# Patient Record
Sex: Female | Born: 1935 | Race: White | Hispanic: No | Smoking: Never smoker
Health system: Southern US, Community
[De-identification: ages and names within clinical notes are randomized; demographics above are authoritative.]

## PROBLEM LIST (undated history)

## (undated) DIAGNOSIS — I495 Sick sinus syndrome: Secondary | ICD-10-CM

## (undated) DIAGNOSIS — Z860100 Personal history of colon polyps, unspecified: Secondary | ICD-10-CM

## (undated) DIAGNOSIS — M2041 Other hammer toe(s) (acquired), right foot: Secondary | ICD-10-CM

## (undated) DIAGNOSIS — Z853 Personal history of malignant neoplasm of breast: Secondary | ICD-10-CM

## (undated) DIAGNOSIS — I214 Non-ST elevation (NSTEMI) myocardial infarction: Secondary | ICD-10-CM

## (undated) DIAGNOSIS — S46219A Strain of muscle, fascia and tendon of other parts of biceps, unspecified arm, initial encounter: Secondary | ICD-10-CM

## (undated) DIAGNOSIS — M1990 Unspecified osteoarthritis, unspecified site: Secondary | ICD-10-CM

## (undated) DIAGNOSIS — K5730 Diverticulosis of large intestine without perforation or abscess without bleeding: Secondary | ICD-10-CM

## (undated) DIAGNOSIS — K219 Gastro-esophageal reflux disease without esophagitis: Secondary | ICD-10-CM

## (undated) DIAGNOSIS — I480 Paroxysmal atrial fibrillation: Secondary | ICD-10-CM

## (undated) HISTORY — DX: Other hammer toe(s) (acquired), right foot: M20.41

## (undated) HISTORY — DX: Strain of muscle, fascia and tendon of other parts of biceps, unspecified arm, initial encounter: S46.219A

## (undated) HISTORY — DX: Unspecified osteoarthritis, unspecified site: M19.90

## (undated) HISTORY — DX: Personal history of colon polyps, unspecified: Z86.0100

## (undated) HISTORY — PX: TOE SURGERY: SHX1073

## (undated) HISTORY — DX: Personal history of colonic polyps: Z86.010

## (undated) HISTORY — DX: Gastro-esophageal reflux disease without esophagitis: K21.9

## (undated) HISTORY — DX: Diverticulosis of large intestine without perforation or abscess without bleeding: K57.30

## (undated) HISTORY — DX: Paroxysmal atrial fibrillation: I48.0

## (undated) HISTORY — PX: ABDOMINAL HYSTERECTOMY: SHX81

## (undated) HISTORY — DX: Personal history of malignant neoplasm of breast: Z85.3

## (undated) HISTORY — DX: Sick sinus syndrome: I49.5

## (undated) HISTORY — DX: Non-ST elevation (NSTEMI) myocardial infarction: I21.4

---

## 1964-08-14 HISTORY — PX: PARTIAL HYSTERECTOMY: SHX80

## 1982-08-14 HISTORY — PX: MASTECTOMY: SHX3

## 1983-08-15 HISTORY — PX: MASTECTOMY: SHX3

## 1995-08-15 HISTORY — PX: CORRECTION HAMMER TOE: SUR317

## 1999-10-13 HISTORY — PX: ESOPHAGOGASTRODUODENOSCOPY: SHX1529

## 2004-10-03 ENCOUNTER — Ambulatory Visit: Payer: Self-pay | Admitting: Internal Medicine

## 2004-11-28 ENCOUNTER — Ambulatory Visit: Payer: Self-pay | Admitting: Internal Medicine

## 2005-01-06 ENCOUNTER — Ambulatory Visit: Payer: Self-pay | Admitting: Internal Medicine

## 2005-02-01 ENCOUNTER — Encounter (INDEPENDENT_AMBULATORY_CARE_PROVIDER_SITE_OTHER): Payer: Self-pay | Admitting: *Deleted

## 2005-02-01 ENCOUNTER — Ambulatory Visit: Payer: Self-pay | Admitting: Internal Medicine

## 2005-03-03 ENCOUNTER — Ambulatory Visit: Payer: Self-pay | Admitting: Internal Medicine

## 2005-06-28 ENCOUNTER — Ambulatory Visit: Payer: Self-pay | Admitting: Internal Medicine

## 2005-07-03 ENCOUNTER — Ambulatory Visit: Payer: Self-pay | Admitting: Internal Medicine

## 2005-08-24 ENCOUNTER — Ambulatory Visit: Payer: Self-pay | Admitting: Family Medicine

## 2005-09-17 ENCOUNTER — Other Ambulatory Visit: Payer: Self-pay

## 2005-09-17 ENCOUNTER — Emergency Department: Payer: Self-pay | Admitting: Emergency Medicine

## 2005-09-21 ENCOUNTER — Encounter: Payer: Self-pay | Admitting: Internal Medicine

## 2005-10-16 ENCOUNTER — Ambulatory Visit: Payer: Self-pay | Admitting: Internal Medicine

## 2006-03-26 ENCOUNTER — Ambulatory Visit: Payer: Self-pay | Admitting: Internal Medicine

## 2006-09-18 ENCOUNTER — Ambulatory Visit: Payer: Self-pay | Admitting: Family Medicine

## 2006-10-02 ENCOUNTER — Ambulatory Visit: Payer: Self-pay | Admitting: Family Medicine

## 2006-12-20 ENCOUNTER — Encounter: Payer: Self-pay | Admitting: Internal Medicine

## 2007-02-11 ENCOUNTER — Ambulatory Visit: Payer: Self-pay | Admitting: Internal Medicine

## 2007-03-07 DIAGNOSIS — K5730 Diverticulosis of large intestine without perforation or abscess without bleeding: Secondary | ICD-10-CM | POA: Insufficient documentation

## 2007-03-07 DIAGNOSIS — J4520 Mild intermittent asthma, uncomplicated: Secondary | ICD-10-CM | POA: Insufficient documentation

## 2007-03-07 DIAGNOSIS — J309 Allergic rhinitis, unspecified: Secondary | ICD-10-CM | POA: Insufficient documentation

## 2007-03-07 DIAGNOSIS — Z853 Personal history of malignant neoplasm of breast: Secondary | ICD-10-CM | POA: Insufficient documentation

## 2007-03-07 DIAGNOSIS — K219 Gastro-esophageal reflux disease without esophagitis: Secondary | ICD-10-CM | POA: Insufficient documentation

## 2007-03-14 ENCOUNTER — Ambulatory Visit: Payer: Self-pay | Admitting: Internal Medicine

## 2007-03-14 DIAGNOSIS — L570 Actinic keratosis: Secondary | ICD-10-CM | POA: Insufficient documentation

## 2007-03-14 LAB — CONVERTED CEMR LAB: Glucose, Bld: 107 mg/dL — ABNORMAL HIGH (ref 70–99)

## 2007-07-04 ENCOUNTER — Encounter: Payer: Self-pay | Admitting: Internal Medicine

## 2007-12-18 ENCOUNTER — Ambulatory Visit: Payer: Self-pay | Admitting: Internal Medicine

## 2007-12-18 DIAGNOSIS — M159 Polyosteoarthritis, unspecified: Secondary | ICD-10-CM | POA: Insufficient documentation

## 2007-12-18 LAB — CONVERTED CEMR LAB
Bilirubin Urine: NEGATIVE
Blood in Urine, dipstick: NEGATIVE
Glucose, Urine, Semiquant: NEGATIVE
Ketones, urine, test strip: NEGATIVE
Nitrite: NEGATIVE
Specific Gravity, Urine: <1.005
Urobilinogen, UA: 0.2
pH: 6.5

## 2008-01-30 ENCOUNTER — Encounter: Payer: Self-pay | Admitting: Internal Medicine

## 2008-03-06 ENCOUNTER — Encounter: Payer: Self-pay | Admitting: Internal Medicine

## 2008-03-06 ENCOUNTER — Ambulatory Visit: Payer: Self-pay | Admitting: Family Medicine

## 2008-03-06 LAB — CONVERTED CEMR LAB
Bilirubin Urine: NEGATIVE
Blood in Urine, dipstick: NEGATIVE
Glucose, Urine, Semiquant: NEGATIVE
Ketones, urine, test strip: NEGATIVE
Specific Gravity, Urine: 1.01
pH: 7

## 2008-03-07 ENCOUNTER — Encounter: Payer: Self-pay | Admitting: Family Medicine

## 2008-03-17 ENCOUNTER — Ambulatory Visit: Payer: Self-pay | Admitting: Internal Medicine

## 2008-03-18 LAB — CONVERTED CEMR LAB
Albumin: 3.8 g/dL (ref 3.5–5.2)
BUN: 19 mg/dL (ref 6–23)
Basophils Absolute: 0 10*3/uL (ref 0.0–0.1)
Basophils Relative: 0.7 % (ref 0.0–3.0)
CO2: 28 meq/L (ref 19–32)
Calcium: 9.5 mg/dL (ref 8.4–10.5)
Chloride: 106 meq/L (ref 96–112)
Creatinine, Ser: 0.9 mg/dL (ref 0.4–1.2)
Eosinophils Absolute: 0.1 10*3/uL (ref 0.0–0.7)
Eosinophils Relative: 2.6 % (ref 0.0–5.0)
GFR calc Af Amer: 78 mL/min
GFR calc non Af Amer: 64 mL/min
Glucose, Bld: 102 mg/dL — ABNORMAL HIGH (ref 70–99)
HCT: 40.6 % (ref 36.0–46.0)
Hemoglobin: 14 g/dL (ref 12.0–15.0)
Lymphocytes Relative: 30.8 % (ref 12.0–46.0)
MCHC: 34.5 g/dL (ref 30.0–36.0)
MCV: 92.5 fL (ref 78.0–100.0)
Monocytes Absolute: 0.3 10*3/uL (ref 0.1–1.0)
Monocytes Relative: 6 % (ref 3.0–12.0)
Neutro Abs: 3.5 10*3/uL (ref 1.4–7.7)
Neutrophils Relative %: 59.9 % (ref 43.0–77.0)
Phosphorus: 3.5 mg/dL (ref 2.3–4.6)
Platelets: 186 10*3/uL (ref 150–400)
Potassium: 4.4 meq/L (ref 3.5–5.1)
RBC: 4.39 M/uL (ref 3.87–5.11)
RDW: 12.7 % (ref 11.5–14.6)
Sodium: 141 meq/L (ref 135–145)
TSH: 0.6 microintl units/mL (ref 0.35–5.50)
WBC: 5.6 10*3/uL (ref 4.5–10.5)

## 2008-03-26 ENCOUNTER — Ambulatory Visit: Payer: Self-pay | Admitting: Internal Medicine

## 2008-04-09 ENCOUNTER — Ambulatory Visit: Payer: Self-pay | Admitting: Internal Medicine

## 2008-04-09 LAB — HM COLONOSCOPY

## 2008-04-23 ENCOUNTER — Telehealth: Payer: Self-pay | Admitting: Internal Medicine

## 2008-05-01 ENCOUNTER — Encounter: Payer: Self-pay | Admitting: Internal Medicine

## 2008-05-14 HISTORY — PX: ROTATOR CUFF REPAIR: SHX139

## 2008-05-18 ENCOUNTER — Encounter: Payer: Self-pay | Admitting: Internal Medicine

## 2008-05-20 ENCOUNTER — Encounter: Payer: Self-pay | Admitting: Internal Medicine

## 2008-05-26 ENCOUNTER — Ambulatory Visit (HOSPITAL_BASED_OUTPATIENT_CLINIC_OR_DEPARTMENT_OTHER): Admission: RE | Admit: 2008-05-26 | Discharge: 2008-05-27 | Payer: Self-pay | Admitting: Orthopedic Surgery

## 2008-05-26 ENCOUNTER — Encounter: Admission: RE | Admit: 2008-05-26 | Discharge: 2008-05-26 | Payer: Self-pay | Admitting: Orthopedic Surgery

## 2008-05-29 ENCOUNTER — Encounter: Payer: Self-pay | Admitting: Internal Medicine

## 2008-06-05 ENCOUNTER — Telehealth: Payer: Self-pay | Admitting: Internal Medicine

## 2008-06-10 ENCOUNTER — Encounter: Payer: Self-pay | Admitting: Internal Medicine

## 2008-07-16 ENCOUNTER — Encounter: Payer: Self-pay | Admitting: Internal Medicine

## 2008-08-03 ENCOUNTER — Encounter: Payer: Self-pay | Admitting: Internal Medicine

## 2008-08-12 ENCOUNTER — Ambulatory Visit: Payer: Self-pay | Admitting: Internal Medicine

## 2008-08-25 ENCOUNTER — Ambulatory Visit: Payer: Self-pay | Admitting: Family Medicine

## 2008-10-21 ENCOUNTER — Encounter: Payer: Self-pay | Admitting: Internal Medicine

## 2009-01-13 ENCOUNTER — Ambulatory Visit: Payer: Self-pay | Admitting: Internal Medicine

## 2009-01-13 LAB — CONVERTED CEMR LAB
Bilirubin Urine: NEGATIVE
Blood in Urine, dipstick: NEGATIVE
Glucose, Urine, Semiquant: NEGATIVE
Ketones, urine, test strip: NEGATIVE
Nitrite: NEGATIVE
Specific Gravity, Urine: 1.025
Urobilinogen, UA: 0.2
pH: 5.5

## 2009-03-09 ENCOUNTER — Encounter: Payer: Self-pay | Admitting: Internal Medicine

## 2009-07-27 ENCOUNTER — Ambulatory Visit: Payer: Self-pay | Admitting: Internal Medicine

## 2009-08-02 LAB — CONVERTED CEMR LAB
ALT: 16 units/L (ref 0–35)
AST: 18 units/L (ref 0–37)
Albumin: 3.6 g/dL (ref 3.5–5.2)
Alkaline Phosphatase: 59 units/L (ref 39–117)
BUN: 19 mg/dL (ref 6–23)
Basophils Absolute: 0 10*3/uL (ref 0.0–0.1)
Basophils Relative: 0.5 % (ref 0.0–3.0)
Bilirubin, Direct: 0.1 mg/dL (ref 0.0–0.3)
CO2: 30 meq/L (ref 19–32)
Calcium: 9.8 mg/dL (ref 8.4–10.5)
Chloride: 106 meq/L (ref 96–112)
Creatinine, Ser: 0.7 mg/dL (ref 0.4–1.2)
Eosinophils Absolute: 0.2 10*3/uL (ref 0.0–0.7)
Eosinophils Relative: 3 % (ref 0.0–5.0)
GFR calc non Af Amer: 85.69 mL/min (ref >60)
Glucose, Bld: 106 mg/dL — ABNORMAL HIGH (ref 70–99)
HCT: 39.1 % (ref 36.0–46.0)
Hemoglobin: 13.5 g/dL (ref 12.0–15.0)
Lymphocytes Relative: 29.5 % (ref 12.0–46.0)
Lymphs Abs: 1.8 10*3/uL (ref 0.7–4.0)
MCHC: 34.5 g/dL (ref 30.0–36.0)
MCV: 95.9 fL (ref 78.0–100.0)
Monocytes Absolute: 0.4 10*3/uL (ref 0.1–1.0)
Monocytes Relative: 7.1 % (ref 3.0–12.0)
Neutro Abs: 3.7 10*3/uL (ref 1.4–7.7)
Neutrophils Relative %: 59.9 % (ref 43.0–77.0)
Phosphorus: 3.8 mg/dL (ref 2.3–4.6)
Platelets: 169 10*3/uL (ref 150.0–400.0)
Potassium: 4.3 meq/L (ref 3.5–5.1)
RBC: 4.08 M/uL (ref 3.87–5.11)
RDW: 12.1 % (ref 11.5–14.6)
Sodium: 141 meq/L (ref 135–145)
TSH: 0.95 microintl units/mL (ref 0.35–5.50)
Total Bilirubin: 1.1 mg/dL (ref 0.3–1.2)
Total Protein: 6.2 g/dL (ref 6.0–8.3)
WBC: 6.1 10*3/uL (ref 4.5–10.5)

## 2009-08-04 ENCOUNTER — Ambulatory Visit: Payer: Self-pay | Admitting: Family Medicine

## 2009-08-04 LAB — CONVERTED CEMR LAB: Rapid Strep: POSITIVE

## 2009-09-15 ENCOUNTER — Ambulatory Visit: Payer: Self-pay | Admitting: Internal Medicine

## 2009-09-21 ENCOUNTER — Encounter: Payer: Self-pay | Admitting: Internal Medicine

## 2009-10-15 ENCOUNTER — Ambulatory Visit: Payer: Self-pay | Admitting: Endocrinology

## 2009-10-15 DIAGNOSIS — N3000 Acute cystitis without hematuria: Secondary | ICD-10-CM | POA: Insufficient documentation

## 2009-10-15 LAB — CONVERTED CEMR LAB
Bilirubin Urine: NEGATIVE
Glucose, Urine, Semiquant: NEGATIVE
Ketones, urine, test strip: NEGATIVE
Nitrite: NEGATIVE
Specific Gravity, Urine: 1.015
Urobilinogen, UA: 0.2
WBC Urine, dipstick: NEGATIVE
pH: 5

## 2009-12-24 ENCOUNTER — Emergency Department: Payer: Self-pay | Admitting: Internal Medicine

## 2009-12-24 ENCOUNTER — Ambulatory Visit: Payer: Self-pay | Admitting: Cardiovascular Disease

## 2009-12-27 ENCOUNTER — Telehealth: Payer: Self-pay | Admitting: Cardiovascular Disease

## 2009-12-29 ENCOUNTER — Ambulatory Visit: Payer: Self-pay | Admitting: Cardiovascular Disease

## 2009-12-30 ENCOUNTER — Encounter: Payer: Self-pay | Admitting: Cardiovascular Disease

## 2009-12-30 ENCOUNTER — Telehealth: Payer: Self-pay | Admitting: Cardiovascular Disease

## 2009-12-30 LAB — CONVERTED CEMR LAB
INR: 1.23 (ref <1.50)
POC INR: 1.23
Prothrombin Time: 15.4 s
Prothrombin Time: 15.4 s — ABNORMAL HIGH (ref 11.6–15.2)

## 2009-12-31 ENCOUNTER — Ambulatory Visit: Payer: Self-pay | Admitting: Cardiovascular Disease

## 2010-01-04 ENCOUNTER — Encounter: Payer: Self-pay | Admitting: Cardiovascular Disease

## 2010-01-04 ENCOUNTER — Ambulatory Visit: Payer: Self-pay | Admitting: Cardiovascular Disease

## 2010-01-04 LAB — CONVERTED CEMR LAB: POC INR: 1.8

## 2010-01-07 ENCOUNTER — Ambulatory Visit: Payer: Self-pay | Admitting: Cardiovascular Disease

## 2010-01-10 DIAGNOSIS — I495 Sick sinus syndrome: Secondary | ICD-10-CM | POA: Insufficient documentation

## 2010-01-10 DIAGNOSIS — I471 Supraventricular tachycardia: Secondary | ICD-10-CM | POA: Insufficient documentation

## 2010-01-11 ENCOUNTER — Encounter: Payer: Self-pay | Admitting: Cardiovascular Disease

## 2010-01-12 ENCOUNTER — Ambulatory Visit: Payer: Self-pay | Admitting: Cardiovascular Disease

## 2010-01-13 LAB — CONVERTED CEMR LAB
INR: 2.46 — ABNORMAL HIGH (ref <1.50)
POC INR: 2.46
Prothrombin Time: 26.5 s
Prothrombin Time: 26.5 s — ABNORMAL HIGH (ref 11.6–15.2)

## 2010-01-20 ENCOUNTER — Encounter: Payer: Self-pay | Admitting: Internal Medicine

## 2010-01-27 ENCOUNTER — Ambulatory Visit: Payer: Self-pay | Admitting: Cardiovascular Disease

## 2010-01-27 DIAGNOSIS — I4891 Unspecified atrial fibrillation: Secondary | ICD-10-CM | POA: Insufficient documentation

## 2010-01-28 ENCOUNTER — Encounter: Payer: Self-pay | Admitting: Cardiovascular Disease

## 2010-01-28 LAB — CONVERTED CEMR LAB
INR: 2.45 — ABNORMAL HIGH (ref <1.50)
Prothrombin Time: 26.4 s — ABNORMAL HIGH (ref 11.6–15.2)

## 2010-01-31 ENCOUNTER — Encounter: Payer: Self-pay | Admitting: Cardiovascular Disease

## 2010-01-31 LAB — CONVERTED CEMR LAB
POC INR: 2.45
Prothrombin Time: 26.4 s

## 2010-02-01 ENCOUNTER — Telehealth: Payer: Self-pay | Admitting: Cardiovascular Disease

## 2010-02-07 ENCOUNTER — Ambulatory Visit: Payer: Self-pay | Admitting: Internal Medicine

## 2010-02-07 DIAGNOSIS — M549 Dorsalgia, unspecified: Secondary | ICD-10-CM | POA: Insufficient documentation

## 2010-02-21 ENCOUNTER — Ambulatory Visit: Payer: Self-pay | Admitting: Cardiovascular Disease

## 2010-02-21 LAB — CONVERTED CEMR LAB: POC INR: 4

## 2010-03-02 ENCOUNTER — Ambulatory Visit: Payer: Self-pay | Admitting: Cardiovascular Disease

## 2010-03-02 LAB — CONVERTED CEMR LAB: POC INR: 2.7

## 2010-03-16 ENCOUNTER — Ambulatory Visit: Payer: Self-pay | Admitting: Cardiovascular Disease

## 2010-03-16 LAB — CONVERTED CEMR LAB: POC INR: 2.5

## 2010-03-18 ENCOUNTER — Encounter: Payer: Self-pay | Admitting: Family Medicine

## 2010-03-21 ENCOUNTER — Ambulatory Visit: Payer: Self-pay | Admitting: Internal Medicine

## 2010-04-06 ENCOUNTER — Ambulatory Visit: Payer: Self-pay | Admitting: Cardiovascular Disease

## 2010-04-06 LAB — CONVERTED CEMR LAB: POC INR: 2

## 2010-05-05 ENCOUNTER — Ambulatory Visit: Payer: Self-pay | Admitting: Cardiovascular Disease

## 2010-05-05 LAB — CONVERTED CEMR LAB: INR: 3.6

## 2010-05-13 ENCOUNTER — Ambulatory Visit: Payer: Self-pay | Admitting: Internal Medicine

## 2010-05-13 LAB — CONVERTED CEMR LAB: POC INR: 2

## 2010-05-17 ENCOUNTER — Encounter: Payer: Self-pay | Admitting: Internal Medicine

## 2010-05-25 ENCOUNTER — Encounter: Payer: Self-pay | Admitting: Internal Medicine

## 2010-05-25 ENCOUNTER — Encounter: Payer: Self-pay | Admitting: Cardiovascular Disease

## 2010-05-25 ENCOUNTER — Ambulatory Visit: Payer: Self-pay | Admitting: Ophthalmology

## 2010-05-26 ENCOUNTER — Encounter: Payer: Self-pay | Admitting: Cardiovascular Disease

## 2010-05-26 LAB — CONVERTED CEMR LAB: POC INR: 1.6

## 2010-05-30 ENCOUNTER — Telehealth: Payer: Self-pay | Admitting: Cardiovascular Disease

## 2010-06-01 ENCOUNTER — Ambulatory Visit: Payer: Self-pay | Admitting: Ophthalmology

## 2010-06-08 ENCOUNTER — Ambulatory Visit: Payer: Self-pay | Admitting: Cardiovascular Disease

## 2010-06-08 LAB — CONVERTED CEMR LAB: POC INR: 1.6

## 2010-06-24 ENCOUNTER — Telehealth: Payer: Self-pay | Admitting: Cardiovascular Disease

## 2010-06-29 ENCOUNTER — Ambulatory Visit: Payer: Self-pay | Admitting: Cardiovascular Disease

## 2010-06-29 LAB — CONVERTED CEMR LAB: POC INR: 2.2

## 2010-07-20 ENCOUNTER — Ambulatory Visit: Payer: Self-pay | Admitting: Cardiovascular Disease

## 2010-07-20 LAB — CONVERTED CEMR LAB: POC INR: 1.9

## 2010-08-03 ENCOUNTER — Ambulatory Visit: Payer: Self-pay | Admitting: Cardiovascular Disease

## 2010-08-03 LAB — CONVERTED CEMR LAB: POC INR: 1.8

## 2010-08-09 ENCOUNTER — Ambulatory Visit
Admission: RE | Admit: 2010-08-09 | Discharge: 2010-08-09 | Payer: Self-pay | Source: Home / Self Care | Attending: Family Medicine | Admitting: Family Medicine

## 2010-08-09 DIAGNOSIS — H8110 Benign paroxysmal vertigo, unspecified ear: Secondary | ICD-10-CM | POA: Insufficient documentation

## 2010-08-09 LAB — CONVERTED CEMR LAB
Cholesterol, target level: 200 mg/dL
HDL goal, serum: 40 mg/dL
LDL Goal: 160 mg/dL

## 2010-08-11 ENCOUNTER — Encounter: Payer: Self-pay | Admitting: Family Medicine

## 2010-08-11 ENCOUNTER — Ambulatory Visit
Admission: RE | Admit: 2010-08-11 | Discharge: 2010-08-11 | Payer: Self-pay | Source: Home / Self Care | Attending: Internal Medicine | Admitting: Internal Medicine

## 2010-08-12 ENCOUNTER — Ambulatory Visit
Admission: RE | Admit: 2010-08-12 | Discharge: 2010-08-12 | Payer: Self-pay | Source: Home / Self Care | Attending: Internal Medicine | Admitting: Internal Medicine

## 2010-08-12 LAB — CONVERTED CEMR LAB
ALT: 16 units/L (ref 0–35)
AST: 19 units/L (ref 0–37)
Albumin: 3.5 g/dL (ref 3.5–5.2)
Alkaline Phosphatase: 66 units/L (ref 39–117)
BUN: 20 mg/dL (ref 6–23)
Basophils Absolute: 0 10*3/uL (ref 0.0–0.1)
Basophils Relative: 0.1 % (ref 0.0–3.0)
Bilirubin, Direct: 0.1 mg/dL (ref 0.0–0.3)
CO2: 31 meq/L (ref 19–32)
Calcium: 8.8 mg/dL (ref 8.4–10.5)
Chloride: 102 meq/L (ref 96–112)
Creatinine, Ser: 0.8 mg/dL (ref 0.4–1.2)
Eosinophils Absolute: 0.2 10*3/uL (ref 0.0–0.7)
Eosinophils Relative: 3.7 % (ref 0.0–5.0)
GFR calc non Af Amer: 77.72 mL/min (ref >60.00)
Glucose, Bld: 118 mg/dL — ABNORMAL HIGH (ref 70–99)
HCT: 40 % (ref 36.0–46.0)
Hemoglobin: 13.4 g/dL (ref 12.0–15.0)
Lymphocytes Relative: 30.5 % (ref 12.0–46.0)
Lymphs Abs: 1.9 10*3/uL (ref 0.7–4.0)
MCHC: 33.6 g/dL (ref 30.0–36.0)
MCV: 93.6 fL (ref 78.0–100.0)
Monocytes Absolute: 0.4 10*3/uL (ref 0.1–1.0)
Monocytes Relative: 6.5 % (ref 3.0–12.0)
Neutro Abs: 3.6 10*3/uL (ref 1.4–7.7)
Neutrophils Relative %: 59.2 % (ref 43.0–77.0)
Platelets: 190 10*3/uL (ref 150.0–400.0)
Potassium: 4.2 meq/L (ref 3.5–5.1)
RBC: 4.27 M/uL (ref 3.87–5.11)
RDW: 13.7 % (ref 11.5–14.6)
Sodium: 139 meq/L (ref 135–145)
TSH: 0.74 microintl units/mL (ref 0.35–5.50)
Total Bilirubin: 0.6 mg/dL (ref 0.3–1.2)
Total Protein: 6.1 g/dL (ref 6.0–8.3)
WBC: 6.1 10*3/uL (ref 4.5–10.5)

## 2010-08-24 ENCOUNTER — Ambulatory Visit: Admission: RE | Admit: 2010-08-24 | Discharge: 2010-08-24 | Payer: Self-pay | Source: Home / Self Care

## 2010-08-24 LAB — CONVERTED CEMR LAB: POC INR: 2.7

## 2010-09-15 NOTE — Assessment & Plan Note (Signed)
Summary: acute/possible uti/cmt   Vital Signs:  Patient Profile:   77 Years Old Female Height:     68 inches Weight:      164 pounds Temp:     97.7 degrees F oral Pulse rate:   68 / minute Pulse rhythm:   regular BP sitting:   140 / 60  (left arm) Cuff size:   regular  Vitals Entered By: Providence Crosby (March 06, 2008 2:52 PM)                 Chief Complaint:  ?UTI// TOOK AZO.  History of Present Illness: "I think I hacve some bacteria in my urinary track. I burning with urination, needs to void often, started at bedtime, 930-1000PM. No Fever Discomfort in lower stomach, no back pain. Las turinary infection approx last Spring she thinks. Is on Azo, no Abs since last UTI.    Prior Medications Reviewed Using: Patient Recall  Current Allergies (reviewed today): * AURAMYCIN * LATEX      Physical Exam  General:     Well-developed,well-nourished,in no acute distress; alert,appropriate and cooperative throughout examination Head:     Normocephalic and atraumatic without obvious abnormalities. No apparent alopecia or balding. Eyes:     Conjunctiva clear bilaterally.  Neck:     No deformities, masses, or tenderness noted. Chest Wall:     No deformities, masses, or tenderness noted. Lungs:     Normal respiratory effort, chest expands symmetrically. Lungs are clear to auscultation, no crackles or wheezes. Heart:     Normal rate and regular rhythm. S1 and S2 normal without gallop, murmur, click, rub or other extra sounds. Abdomen:     Mild  suprapubic discomfort. Nml BS No mass felt. Msk:     No CVAT.    Impression & Recommendations:  Problem # 1:  UTI (ICD-599.0) Suprapubic discomfort with mild WBCs, assume UTI. Last tx'd with Cipro 5/09. Will use Bactrim. U/C sent. Her updated medication list for this problem includes:    Ciprofloxacin Hcl 250 Mg Tabs (Ciprofloxacin hcl) .Marland Kitchen... 1 two times a day    Bactrim Ds 800-160 Mg Tabs (Sulfamethoxazole-trimethoprim) .Marland Kitchen...  1 tab by mouth bid  Encouraged to push clear liquids, get enough rest, and take acetaminophen as needed. To be seen in 10 days if no improvement, sooner if worse.  Orders: UA Dipstick W/ Micro (manual) (16109)   Complete Medication List: 1)  Albuterol 90 Mcg/act Aers (Albuterol) .... As needed 2)  Singulair 10 Mg Tabs (Montelukast sodium) .... Take one by mouth once a day 3)  Calcium + D  .... Take one by mouth once a day 4)  Advair Diskus 100-50 Mcg/dose Misc (Fluticasone-salmeterol) .... Use two times a day 5)  Multivitamins Tabs (Multiple vitamin) .... Take one by mouth once a day 6)  Zantac 75mg   .... Prn 7)  Zyrtec Allergy 10 Mg Tabs (Cetirizine hcl) .... Take one by mouth once a day 8)  Nasonex 50 Mcg/act Susp (Mometasone furoate) .... Use 1-2 sprays in each nostil once a day as needed 9)  Ciprofloxacin Hcl 250 Mg Tabs (Ciprofloxacin hcl) .Marland Kitchen.. 1 two times a day 10)  Bactrim Ds 800-160 Mg Tabs (Sulfamethoxazole-trimethoprim) .Marland Kitchen.. 1 tab by mouth bid    Prescriptions: BACTRIM DS 800-160 MG  TABS (SULFAMETHOXAZOLE-TRIMETHOPRIM) 1 tab by mouth bid  #20 x 0   Entered and Authorized by:   Shaune Leeks MD   Signed by:   Shaune Leeks MD on 03/06/2008  Method used:   Print then Give to Patient   RxID:   [id number]  ] Laboratory Results   Urine Tests  Date/Time Recieved: March 06, 2008 2:58 PM Date/Time Reported: March 06, 2008 2:58 PM  Routine Urinalysis   Color: orange Appearance: Hazy Glucose: negative   (Normal Range: Negative) Bilirubin: negative   (Normal Range: Negative) Ketone: negative   (Normal Range: Negative) Spec. Gravity: 1.010   (Normal Range: 1.003-1.035) Blood: negative   (Normal Range: Negative) pH: 7.0   (Normal Range: 5.0-8.0) Protein: ?   (Normal Range: Negative) Urobilinogen: ?   (Normal Range: 0-1) Nitrite: ?   (Normal Range: Negative) Leukocyte Esterace: moderate   (Normal Range: Negative)  Urine Microscopic WBC/hpf:  2-6 RBC/hpf: 0-1 Bacteria: 1+ Epithelial: None    Comments: TOOK AZO     Appended Document: Orders Update     Clinical Lists Changes  Orders: Added new Test order of T-Urine Culture (Spectrum Order) [id number]) - Signed
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
## 2010-09-15 NOTE — Consult Note (Signed)
Summary: [facility]/Consultation Report/Dr. Sypher  [facility]/Consultation Report/Dr. Sypher   Imported By: Mickle Asper 05/28/2008 11:01:09  _____________________________________________________________________  External Attachment:    Type:   Image     Comment:   External Document  Appended Document: [facility]/Consultation Report/Dr. Sypher MRI shows rotator cuff tear planning surgery

## 2010-09-15 NOTE — Assessment & Plan Note (Signed)
Summary: UTI  Medications Added ALBUTEROL 90 MCG/ACT AERS (ALBUTEROL)  SINGULAIR 10 MG TABS (MONTELUKAST SODIUM)  CIPRO 250 MG  TABS (CIPROFLOXACIN HCL) 1 two times a day        Vital Signs:  Patient Profile:   77 Years Old Female Weight:      167 pounds Temp:     98.6 degrees F oral Pulse rate:   60 / minute BP sitting:   120 / 58  (left arm)  Vitals Entered By: Wandra Mannan (February 11, 2007 10:42 AM)               Chief Complaint:  ? uti.  History of Present Illness: Feeling her urine symptoms since 6/27 Feels tired, dysuria + urgency No hematuria No fever, chills or back pain  Tried cranberry this weekend then had to use OTC urine analgesic this morning due to worsening        Physical Exam  General:     alert.  NAD Abdomen:     soft.  Mild suprapubic tenderness Msk:     No CVA tenderness    Impression & Recommendations:  Problem # 1:  U T I (ICD-599.0) Assessment: New Unable to check urine as she missed the collection device when she voided  Her updated medication list for this problem includes:    Cipro 250 Mg Tabs (Ciprofloxacin hcl) .Marland Kitchen... 1 two times a day   Medications Added to Medication List This Visit: 1)  Albuterol 90 Mcg/act Aers (Albuterol) 2)  Singulair 10 Mg Tabs (Montelukast sodium) 3)  Cipro 250 Mg Tabs (Ciprofloxacin hcl) .Marland Kitchen.. 1 two times a day   Patient Instructions: 1)  Please schedule a follow-up appointment as needed.    Prescriptions: CIPRO 250 MG  TABS (CIPROFLOXACIN HCL) 1 two times a day  #14 x 0   Entered and Authorized by:   Cindee Salt MD   Signed by:   Cindee Salt MD on 02/11/2007   Method used:   Print then Give to Patient   RxID:   [id number]    Vital Signs:  Patient Profile:   77 Years Old Female Weight:      167 pounds Temp:     98.6 degrees F oral Pulse rate:   60 / minute BP sitting:   120 / 58

## 2010-09-15 NOTE — Progress Notes (Signed)
Summary: OTC COUGH MED   Phone Note Call from Patient Call back at Work Phone [phone]   Caller: SELF Call For: [facility] Summary of Call: PT WOULD LIKE TO KNOW IF IT IS OK FOR THE PT TO TAKE OTC COUGH MEDICINE Initial call taken by: Harlon Flor,  May 30, 2010 9:22 AM  Follow-up for Phone Call        Called and with Kennon Rounds pharmacist she recomended that the pt try robitussin.  Follow-up by: Benedict Needy, RN,  May 30, 2010 2:18 PM

## 2010-09-15 NOTE — Progress Notes (Signed)
Summary: wants flu shot in her thigh  Phone Note Call from Patient   Caller: Patient Call For: dr Alphonsus Sias Summary of Call: Pt wants to get a flu shot at work but doesnt want this in her arm, she recently had shoulder surgery  and doesnt want to hurt her good arm..  She is asking if she can get this somewhere besides her arm.  I told her, according to the pt information sheet with the vaccine, she cant get this in her hip but she could in her thigh.  I checked with Willaim Sheng and she said she didnt see any reason why not.  Itold this to her nurse at work and she said she would look at her thigh to be sure she has a good muscle and if not will try and convince pt to get the shot in her arm. Initial call taken by: Lowella Petties,  June 05, 2008 10:40 AM  Follow-up for Phone Call        that sounds fine Follow-up by: Cindee Salt MD,  June 05, 2008 1:21 PM

## 2010-09-15 NOTE — Letter (Signed)
Summary: [facility]  [facility]   Imported By: Lanelle Bal 03/22/2009 09:40:07  _____________________________________________________________________  External Attachment:    Type:   Image     Comment:   External Document

## 2010-09-15 NOTE — Medication Information (Signed)
Summary: rov/ewj  Anticoagulant Therapy  Managed by: [name], RN, BSN Referring MD: Dr Mariah Milling PCP: Cindee Salt MD Supervising MD: Mariah Milling Indication 1: Atrial Fibrillation Lab Used: [facility] [place] Site: [place] INR POC 2.7 INR RANGE 2.0-3.0  Dietary changes: no    Health status changes: no    Bleeding/hemorrhagic complications: no    Recent/future hospitalizations: no    Any changes in medication regimen? no    Recent/future dental: no  Any missed doses?: no       Is patient compliant with meds? yes       Allergies: 1)  * Auramycin 2)  * Latex  Anticoagulation Management History:      The patient is taking warfarin and comes in today for a routine follow up visit.  Positive risk factors for bleeding include an age of 50 years or older.  The bleeding index is 'intermediate risk'.  Positive CHADS2 values include Age > 52 years old.  Her last INR was 3.6.  Anticoagulation responsible provider: G[facility].  INR POC: 2.7.  Cuvette Lot#: [id number].  Exp: 09/2011.    Anticoagulation Management Assessment/Plan:      The patient's current anticoagulation dose is Warfarin sodium 3 mg tabs: Use as directed by Anticoagualtion Clinic.  The target INR is 2.0-3.0.  The next INR is due 09/21/2010.  Anticoagulation instructions were given to patient.  Results were reviewed/authorized by [name], RN, BSN.  She was notified by [name] RN.         Prior Anticoagulation Instructions: INR 1.8  Start taking 1.5 tablets daily except 2 tablets on Tuesdays, Thursdays, and Saturdays. Recheck in 3 weeks.   Current Anticoagulation Instructions: INR 2.7  Continue on same dosage 1.5 tablets daily except 2 tablets on Tuesdays, Thursdays, and Saturdays.  Recheck 4 weeks.   Prescriptions: WARFARIN SODIUM 3 MG TABS (WARFARIN SODIUM) Use as directed by Anticoagualtion Clinic  #60 x 3   Entered by:   [name] RN   Authorized by:   Dossie Arbour  MD   Signed by:   [name] RN on 08/24/2010   Method used:   Electronically to        [id number] * (retail)       [address]       [place], [place]  [address]       Ph: [phone] or [phone]       Fax: [phone]   RxID:   [id number]

## 2010-09-15 NOTE — Medication Information (Signed)
Summary: Coumadin Clinic   Anticoagulant Therapy  Managed by: [name], RN, BSN Referring MD: Dr Mariah Milling PCP: Cindee Salt MD Supervising MD: Mariah Milling Indication 1: Atrial Fibrillation Lab Used: [facility] Site: [place] INR POC 1.6 INR RANGE 2.0-3.0  Dietary changes: no     Bleeding/hemorrhagic complications: no    Recent/future hospitalizations: yes       Details: Left eye cataract surgery 10/19    Any missed doses?: yes     Details: Missed dose on Saturday  Is patient compliant with meds? yes      Comments: Pt had PT/INR drawn as part of her pre-procedure labs at [facility] labs to be scanned into EMR  Allergies: 1)  * Auramycin 2)  * Latex  Anticoagulation Management History:      Her anticoagulation is being managed by telephone today.  Positive risk factors for bleeding include an age of 75 years or older.  The bleeding index is 'intermediate risk'.  Positive CHADS2 values include Age > 75 years old.  Her last INR was 3.6.  Anticoagulation responsible provider: Goll[facility].  INR POC: 1.6.  Exp: 05/2011.    Anticoagulation Management Assessment/Plan:      The patient's current anticoagulation dose is Warfarin sodium 3 mg tabs: Use as directed by Anticoagualtion Clinic.  The target INR is 2.0-3.0.  The next INR is due 06/08/2010.  Results were reviewed/authorized by [name], RN, BSN.  She was notified by Benedict Needy, RN.         Prior Anticoagulation Instructions: INR 2.0   Continue taking 1.5 tabs daily. Recheck in 2 weeks.   Current Anticoagulation Instructions: INR 1.6   Take 2 tabs today. Then continue taking 1.5 tabs daily recheck in 2 weeks.

## 2010-09-15 NOTE — Progress Notes (Signed)
Summary: wants ortho referral  Phone Note Call from Patient Call back at Work Phone [phone]   Caller: Patient Call For: dr Alphonsus Sias Summary of Call: Pt is asking for referral to dr sypher for shoulder pain. He has been recommended to her by a family member Initial call taken by: Lowella Petties,  April 23, 2008 9:57 AM  Follow-up for Phone Call        okay to make referral Follow-up by: Cindee Salt MD,  April 23, 2008 1:06 PM  Additional Follow-up for Phone Call Additional follow up Details #1::        DR [facility] ON 05/01/08 AT 1:30. Carlton Adam  April 23, 2008 3:56 PM  Additional Follow-up by: Carlton Adam,  April 23, 2008 3:56 PM  New Problems: SHOULDER PAIN (ICD-719.41)   New Problems: SHOULDER PAIN (ICD-719.41)

## 2010-09-15 NOTE — Medication Information (Signed)
Summary: Coumadin Clinic  Anticoagulant Therapy  Managed by: [name], RN, BSN Referring MD: Dr Mariah Milling PCP: Cindee Salt MD Supervising MD: Mariah Milling Indication 1: Atrial Fibrillation Lab Used: [facility] Site: [place] INR POC 2.0 INR RANGE 2.0-3.0  Dietary changes: no    Health status changes: no    Bleeding/hemorrhagic complications: no    Recent/future hospitalizations: no    Any changes in medication regimen? no    Recent/future dental: no  Any missed doses?: no       Is patient compliant with meds? yes       Allergies: 1)  * Auramycin 2)  * Latex  Anticoagulation Management History:      The patient is taking warfarin and comes in today for a routine follow up visit.  Positive risk factors for bleeding include an age of 75 years or older.  The bleeding index is 'intermediate risk'.  Positive CHADS2 values include Age > 56 years old.  Her last INR was 2.45.  Anticoagulation responsible provider: G[facility].  INR POC: 2.0.  Cuvette Lot#: [id number].  Exp: 05/2011.    Anticoagulation Management Assessment/Plan:      The patient's current anticoagulation dose is Warfarin sodium 3 mg tabs: Use as directed by Anticoagualtion Clinic.  The target INR is 2.0-3.0.  The next INR is due 05/04/2010.  Results were reviewed/authorized by [name], RN, BSN.  She was notified by [name] RN.         Prior Anticoagulation Instructions: INR 2.5  Continue on same dosage 1.5 tablets daily except 2 tablets on Tuesdays.  Recheck in 3 weeks.    Current Anticoagulation Instructions: INR 2.0  Take 2 tablets today, then resume same dosage 1.5 tablets daily except 2 tablets on Tuesdays.  Recheck in 4 weeks.

## 2010-09-15 NOTE — Letter (Signed)
Summary: PHI  PHI   Imported By: Harlon Flor 01/11/2010 16:28:08  _____________________________________________________________________  External Attachment:    Type:   Image     Comment:   External Document

## 2010-09-15 NOTE — Consult Note (Signed)
Summary: [facility]/Dr. Sypher  [facility]/Dr. Sypher   Imported By: Eleonore Chiquito 05/27/2008 11:13:30  _____________________________________________________________________  External Attachment:    Type:   Image     Comment:   External Document  Appended Document: [facility] /Dr. Sypher getting MRI of right shoulder

## 2010-09-15 NOTE — Assessment & Plan Note (Signed)
Summary: ST,BODY ACHES/CLE   Vital Signs:  Patient profile:   77 year old female Height:      67.5 inches Weight:      171.75 pounds BMI:     26.60 Temp:     98.5 degrees F oral Pulse rate:   60 / minute Pulse rhythm:   regular BP sitting:   126 / 72  (left arm) Cuff size:   regular  Vitals Entered By: Lewanda Rife LPN (August 04, 2009 11:34 AM)  CC:  sorethroat and hody aches and runny nose.  History of Present Illness: Here for ST and body aches, no fever or chillsx 1-2d --grandchildren have had strep --no work today--increased signs, worked yesterday  Allergies: 1)  * Auramycin 2)  * Latex  Physical Exam  General:  alert, well-developed, well-nourished, and well-hydrated.  NAD Ears:  R ear normal and L ear normal.   Nose:  no nasal discharge, no mucosal edema, and no airflow obstruction.  sinuses neg Mouth:  no exudates and pharyngeal erythema.   Lungs:  normal respiratory effort, no intercostal retractions, no accessory muscle use, no dullness, no crackles, and no wheezes.   Neurologic:  alert & oriented X3 and gait normal.   Cervical Nodes:  no anterior cervical adenopathy and no posterior cervical adenopathy.   Psych:  normally interactive and good eye contact.     Impression & Recommendations:  Problem # 1:  STREPTOCOCCAL PHARYNGITIS (ICD-034.0) Assessment New rapid strep pos continue comfort care measures: increase po fluids, rest, tylenol or IBP as needed will start on amoxicillin two times a day x10d gargae several tines once daily see back if not imporoved Her updated medication list for this problem includes:    Ibuprofen 600 Mg Tabs (Ibuprofen) .Marland Kitchen... As needed for rotator cuff surgery    Ciprofloxacin Hcl 250 Mg Tabs (Ciprofloxacin hcl) .Marland Kitchen... 1 tab two times a day for bladder infection    Amoxicillin 500 Mg Caps (Amoxicillin) .Marland Kitchen... Take 2 caps two times a day  Orders: Rapid Strep (84696)  Complete Medication List: 1)  Albuterol 90 Mcg/act Aers  (Albuterol) .... As needed 2)  Singulair 10 Mg Tabs (Montelukast sodium) .... Take one by mouth once a day 3)  Calcium + D  .... Take one by mouth once a day 4)  Advair Diskus 100-50 Mcg/dose Misc (Fluticasone-salmeterol) .... Use two times a day 5)  Multivitamins Tabs (Multiple vitamin) .... Take one by mouth once a day 6)  Zyrtec Allergy 10 Mg Tabs (Cetirizine hcl) .... Take one by mouth once a day as needed 7)  Nasonex 50 Mcg/act Susp (Mometasone furoate) .... Use 1-2 sprays in each nostil once a day as needed 8)  Vitamin C 500 Mg Tabs (Ascorbic acid) .... Take 1 tablet by mouth once a day 9)  Ibuprofen 600 Mg Tabs (Ibuprofen) .... As needed for rotator cuff surgery 10)  Ciprofloxacin Hcl 250 Mg Tabs (Ciprofloxacin hcl) .Marland Kitchen.. 1 tab two times a day for bladder infection 11)  Amoxicillin 500 Mg Caps (Amoxicillin) .... Take 2 caps two times a day Prescriptions: AMOXICILLIN 500 MG CAPS (AMOXICILLIN) take 2 caps two times a day  #40 x 0   Entered and Authorized by:   Gildardo Griffes FNP   Signed by:   Gildardo Griffes FNP on 08/04/2009   Method used:   Print then Give to Patient   RxID:   [id number]   Current Allergies (reviewed today): * AURAMYCIN * LATEX  Laboratory Results  Date/Time Received: August 04, 2009 11:36 AM  Date/Time Reported: August 04, 2009 11:36 AM   Other Tests  Rapid Strep: positive  Kit Test Internal QC: Positive   (Normal Range: Negative)

## 2010-09-15 NOTE — Progress Notes (Signed)
Summary: FMLA   Phone Note Call from Patient Call back at Home Phone [phone]   Caller: SELF Call For: [facility] Summary of Call: PT WOULD LIKE A CALL BACK ABOUT HER FMLA PAPERS-STATES THAT A LETTER WAS SUPPOSED TO BE SUBMITTED YESTERDAY TO HER [employer]PARTMENT HR fax [phone] Initial call taken by: Harlon Flor,  Dec 30, 2009 9:29 AM  Follow-up for Phone Call        Called spoke with pt.  Will call HR for pt to explain delay in forms [employer] [phone].   Follow-up by: [name] RN,  Dec 30, 2009 5:13 PM  Additional Follow-up for Phone Call Additional follow up Details #1::        Attempted to call pt's HR representative [employer].  LMOM TCB. [name] RN  Dec 31, 2009 9:24 AM Spoke with [employer], [place] states they need a letter or something because pt in currently out of work unexcused.  Per Dr Mariah Milling have pt come in for EKG, so we can assess her afib given we have never assessed and then we can go from there.  Additional Follow-up by: [name] RN,  Dec 31, 2009 2:47 PM

## 2010-09-15 NOTE — Progress Notes (Signed)
Summary: QUESTION   Phone Note Call from Patient Call back at Home Phone [phone]   Caller: SELF Call For: [facility] Summary of Call: PT WOULD LIKE TO KNOW IF SHE NEEDS TO REMAIN OUT OF WORK UNTIL HER APPT WITH [facility] ON 5/27?  IF SO, SHE WILL NEED A LETTER FOR WORK. Initial call taken by: Harlon Flor,  Dec 27, 2009 11:47 AM  Follow-up for Phone Call        Called spoke with pt pt was seen at [facility] in the ED 12/24/09.  Pt has coumadin f/u on 5/18 and an appt with Dr Mariah Milling on 5/27.  Pt wants to know if it is ok to return to work or does she need to be out of work and if so needs a letter stating so.  Pt states she is weak, but getting stronger by the day. Please advise. Thanks. Follow-up by: [name] RN,  Dec 27, 2009 12:41 PM    Additional Follow-up for Phone Call Additional follow up Details #2::    ok to go back to work and we will see her on 5/27.    Appended Document: QUESTION Called spoke with pt.  Advised ok per Dr Mariah Milling to return to work.  Pt states has to have FMLA completed for 3 days missed work including weekend so will need completed advised pt to bring to OV on 12/29/09.  EWJ

## 2010-09-15 NOTE — Assessment & Plan Note (Signed)
Summary: 2nd opinion on vestibular rehab-per Dee//kad   Vital Signs:  Patient profile:   77 year old female Weight:      165 pounds O2 Sat:      98 % on Room air Temp:     98.5 degrees F oral Pulse rate:   66 / minute Pulse (ortho):   66 / minute Pulse rhythm:   regular BP sitting:   138 / 70  (left arm) Cuff size:   regular  Vitals Entered By: Mervin Hack CMA Duncan Dull) (August 12, 2010 12:46 PM)  O2 Flow:  Room air  Serial Vital Signs/Assessments:  Time      Position  BP       Pulse  Resp  Temp     By 12:56 PM                     56                    DeShannon Smith CMA (AAMA) 12:56 PM                     66                    DeShannon Smith CMA (AAMA) 12:56 PM                     66                    DeShannon Smith CMA (AAMA)           Lying LA  149/56                         DeShannon Smith CMA (AAMA)           Sitting   138/70                         DeShannon Smith CMA (AAMA)           Standing  123/67                         DeShannon Smith CMA (AAMA)  Comments: 12:56 PM   By: Mervin Hack CMA (AAMA)   CC: discuss therapy    History of Present Illness: reviewed Dr Guttierez's notes  Vertigo has been bad for about 2 weeks Started when she sat up in bed Intermittent bad spells---worse in AM has to stay still at times--or even lie back in bed  mild nausea but feels queasy and jittery in stomach Had been staggering some but no falls--- spreads her feet and supports herself  Fairly stable status over the 2 weeks Worst in and out of bed doesn't turn quickly when up  Right ear hearing is still off despite cerumen cleaning No tinnitus  Still going to work  still driving  on the meclzine noted years ago, she had good response to valium  Allergies: 1)  * Auramycin 2)  * Latex  Past History:  Past medical, surgical, family and social histories (including risk factors) reviewed for relevance to current acute and chronic problems.  Past  Medical History: Reviewed history from 02/07/2010 and no changes required. Allergic rhinitis Asthma Breast cancer, hx of Colonic polyps, hx of Diverticulosis, colon GERD ?Meniere's Osteoarthritis Sick sinus/tachy-brady syndrome  Past Surgical History: Reviewed history  from 08/12/2008 and no changes required. Mastectomy, left 1984 Mastectomy, right 1985 Hysterectomy, partial with bladder tack 1966 Correction hammer toe left 2nd toe 1997 EGD- inflam only 03/01 Left toe surgery (2nd/3rd) 2001/2002 10/09 right rotator cuff surgery  Family History: Reviewed history from 03/07/2007 and no changes required. Father: Deceased- heart failure Mother: Deceased-heart failure Siblings: One brother deceased, one living with arthritis DM- maternal grandmother Gout/arthritis- mother Colon cancer- Maternal grandfather  Social History: Reviewed history from 03/07/2007 and no changes required. Widowed Children: 2 Occupation: Lab [employer] Never Smoked Alcohol use-no  Review of Systems       appetite is fine weight down a few pounds since August  Physical Exam  General:  alert.  NAD Eyes:  pupils equal and pupils round.   Very slight horizontal nystagmus with end gaze Neurologic:  mild unstable with gait Finger to nose normal No sig tremor no bradykinesia   Impression & Recommendations:  Problem # 1:  BENIGN POSITIONAL VERTIGO (ICD-386.11) Assessment Comment Only counselled more than half of 15 minute visit will continue the meclizine she prefers to hold off on the vestibular therapy may resolve spontaneously will proceed with eval if not better in the next 1-2 weeks  Her updated medication list for this problem includes:    Antivert 25 Mg Tabs (Meclizine hcl) ..... One by mouth every 6 hours as needed nausea/ dizziness  Complete Medication List: 1)  Proventil Hfa 108 (90 Base) Mcg/act Aers (Albuterol sulfate) .... Take as needed 2)  Singulair 10 Mg  Tabs (Montelukast sodium) .... Take one by mouth once a day 3)  Advair Diskus 100-50 Mcg/dose Misc (Fluticasone-salmeterol) .... Use two times a day 4)  Nasonex 50 Mcg/act Susp (Mometasone furoate) .... Use 1-2 sprays in each nostil once a day as needed 5)  Metoprolol Tartrate 25 Mg Tabs (Metoprolol tartrate) .... Take one tablet by mouth as needed 6)  Warfarin Sodium 3 Mg Tabs (Warfarin sodium) .... Use as directed by anticoagualtion clinic 7)  Zaditor 0.025 % Soln (Ketotifen fumarate) .... Use 1 drop per eye two times a day 8)  Multivitamins Tabs (Multiple vitamin) .... Take one by mouth once a day 9)  Calcium-vitamin D 250-125 Mg-unit Tabs (Calcium carbonate-vitamin d) .... Take 1 by mouth once daily 10)  Tylenol 325 Mg Tabs (Acetaminophen) .... As directed  as needed 11)  Antivert 25 Mg Tabs (Meclizine hcl) .... One by mouth every 6 hours as needed nausea/ dizziness  Patient Instructions: 1)  Please keep your February appointment 2)  Call in the next 1-2 weeks if the vertigo is not better   Orders Added: 1)  Est. Patient Level III [03474]    Current Allergies (reviewed today): * AURAMYCIN * LATEX

## 2010-09-15 NOTE — Miscellaneous (Signed)
Summary: PT Discharge/[facility]  PT Discharge/[facility]   Imported By: Lanelle Bal 03/25/2010 09:57:48  _____________________________________________________________________  External Attachment:    Type:   Image     Comment:   External Document

## 2010-09-15 NOTE — Assessment & Plan Note (Signed)
Summary: EKG/ewj  Nurse Visit   Vital Signs:  Patient profile:   77 year old female Pulse rate:   55 / minute BP sitting:   129 / 73  (right arm)  Vitals Entered By: [name] RN (Dec 31, 2009 5:05 PM)  CC:  EKG done pt is in sinus brady.   Allergies: 1)  * Auramycin 2)  * Latex  Appended Document: EKG/ewj Holter 48 hrs completed showing SVT runs and pauses of up to 2.5 secs.  Case discussed with Dr. Graciela Husbands and he is concerned though suggests we continue to try  metoprolol as needed for tachycardia.  If she becomes more symptomatic, she will need a pacemaker (would have a low threshold for pacer placement).  Appended Document: EKG/ewj noted

## 2010-09-15 NOTE — Miscellaneous (Signed)
Summary: [facility]/Office Visit/Dr. Claudette [facility]/Office Visit/Dr. [place] Callas   Imported By: Mickle Asper 02/06/2008 16:23:58  _____________________________________________________________________  External Attachment:    Type:   Image     Comment:   External Document  Appended Document: [facility]/Office Visit/Dr. [place] Callas stable allergy status

## 2010-09-15 NOTE — Letter (Signed)
Summary: [facility]  [facility]   Imported By: Maryln Gottron 05/30/2010 15:12:54  _____________________________________________________________________  External Attachment:    Type:   Image     Comment:   External Document  Appended Document: [facility] allergies fine no changes

## 2010-09-15 NOTE — Medication Information (Signed)
Summary: Coumadin Clinic  Anticoagulant Therapy  Managed by: [name], RN, BSN Referring MD: Dr Cherlynn Polo MD: Mariah Milling Indication 1: Atrial Fibrillation Lab Used: [facility] [place] Site: [place] INR POC 1.8 INR RANGE 2.0-3.0    Bleeding/hemorrhagic complications: no     Any changes in medication regimen? no     Any missed doses?: no       Is patient compliant with meds? yes       Allergies: 1)  * Auramycin 2)  * Latex  Anticoagulation Management History:      The patient is taking warfarin and comes in today for a routine follow up visit.  Positive risk factors for bleeding include an age of 75 years or older.  The bleeding index is 'intermediate risk'.  Positive CHADS2 values include Age > 75 years old.  Her last INR was 1.23.  Anticoagulation responsible provider: [facility].  INR POC: 1.8.  Cuvette Lot#: [id number].  Exp: 03/2011.    Anticoagulation Management Assessment/Plan:      The target INR is 2.0-3.0.  The next INR is due 01/11/2010.  Results were reviewed/authorized by [name], RN, BSN.  She was notified by [name] RN.         Prior Anticoagulation Instructions: INR 1.23  Called spoke with pt advised to take 6mg  today then start taking 4.5mg  daily.  Made OV to resheck on 01/04/10.   Current Anticoagulation Instructions: INR 1.8  Start taking 1.5 tablets daily except 2 tablets on Tuesdays and Saturdays.  Recheck in 1 week.

## 2010-09-15 NOTE — Assessment & Plan Note (Signed)
Summary: NP6/AMD   Primary Provider:  Cindee Salt MD  CC:  Consult-results from heart monitor.  History of Present Illness: Leah Hayden is a very pleasant 77 year old woman, patient of Dr. Alphonsus Sias, that was recently seen on May 13 at [facility] for palpitations, nausea and malaise, discharge from the emergency room with an holter monitor now presenting for evaluation of the results.  Event monitor showed frequent episodes of rapid narrow complex rhythm consistent with SVT also with frequent episodes of sinus pauses lasting up to 2.6 seconds. she was asymptomatic through the 48 hours that she wore the monitor.  On May 13, she awoke in the morning with palpitations, nausea, malaise, near syncope. She reports that the symptoms seemed to resolve by the time that she got to the emergency room. She has never had episodes like this before and in general has been doing well. She has been feeling a little bit fatigued since the episode though is getting stronger every day.  She denies any chest pain, edema, shortness of breath.  Problems Prior to Update: 1)  Acute Cystitis  (ICD-595.0) 2)  Osteoarthritis  (ICD-715.90) 3)  Actinic Keratosis  (ICD-702.0) 4)  Health Maintenance Exam  (ICD-V70.0) 5)  Meniere's Disease, Hx Of, Inner Ear  (ICD-V12.49) 6)  Gerd  (ICD-530.81) 7)  Diverticulosis, Colon  (ICD-562.10) 8)  Colonic Polyps, Hx of  (ICD-V12.72) 9)  Breast Cancer, Hx of  (ICD-V10.3) 10)  Asthma  (ICD-493.90) 11)  Allergic Rhinitis  (ICD-477.9)  Medications Prior to Update: 1)  Albuterol 90 Mcg/act Aers (Albuterol) .... As Needed 2)  Singulair 10 Mg Tabs (Montelukast Sodium) .... Take One By Mouth Once A Day 3)  Advair Diskus 100-50 Mcg/dose  Misc (Fluticasone-Salmeterol) .... Use Two Times A Day 4)  Multivitamins   Tabs (Multiple Vitamin) .... Take One By Mouth Once A Day 5)  Zyrtec Allergy 10 Mg  Tabs (Cetirizine Hcl) .... Take One By Mouth Once A Day As  Needed 6)  Nasonex 50 Mcg/act  Susp (Mometasone Furoate) .... Use 1-2 Sprays in Each Nostil Once A Day As Needed 7)  Vitamin C 500 Mg  Tabs (Ascorbic Acid) .... Take 1 Tablet By Mouth Once A Day 8)  Ibuprofen 600 Mg Tabs (Ibuprofen) .... As Needed For Rotator Cuff Surgery 9)  Calcium-Vitamin D 250-125 Mg-Unit Tabs (Calcium Carbonate-Vitamin D) .... Take 1 By Mouth Once Daily 10)  Ciprofloxacin Hcl 500 Mg Tabs (Ciprofloxacin Hcl) .Marland Kitchen.. 1 Two Times A Day  Current Medications (verified): 1)  Proventil Hfa 108 (90 Base) Mcg/act Aers (Albuterol Sulfate) .... Take As Needed 2)  Singulair 10 Mg Tabs (Montelukast Sodium) .... Take One By Mouth Once A Day 3)  Advair Diskus 100-50 Mcg/dose  Misc (Fluticasone-Salmeterol) .... Use Two Times A Day 4)  Multivitamins   Tabs (Multiple Vitamin) .... Take One By Mouth Once A Day 5)  Nasonex 50 Mcg/act  Susp (Mometasone Furoate) .... Use 1-2 Sprays in Each Nostil Once A Day As Needed 6)  Ibuprofen 600 Mg Tabs (Ibuprofen) .... As Needed For Rotator Cuff Surgery 7)  Calcium-Vitamin D 250-125 Mg-Unit Tabs (Calcium Carbonate-Vitamin D) .... Take 1 By Mouth Once Daily 8)  Metoprolol Tartrate 25 Mg Tabs (Metoprolol Tartrate) .... Take One Tablet By Mouth As Needed 9)  Warfarin Sodium 3 Mg Tabs (Warfarin Sodium) .... Use As Directed By Anticoagualtion Clinic 10)  Zaditor 0.025 % Soln (Ketotifen Fumarate) .... Use 1 Drop Per Eye Two Times A Day  Allergies: 1)  *  Auramycin 2)  * Latex  Past History:  Past Medical History: Last updated: 12/18/2007 Allergic rhinitis Asthma Breast cancer, hx of Colonic polyps, hx of Diverticulosis, colon GERD ?Meniere's Osteoarthritis  Past Surgical History: Last updated: 08/12/2008 Mastectomy, left 1984 Mastectomy, right 1985 Hysterectomy, partial with bladder tack 1966 Correction hammer toe left 2nd toe 1997 EGD- inflam only 03/01 Left toe surgery (2nd/3rd) 2001/2002 10/09 right rotator cuff surgery  Family  History: Last updated: 03/20/2007 Father: Deceased- heart failure Mother: Deceased-heart failure Siblings: One brother deceased, one living with arthritis DM- maternal grandmother Gout/arthritis- mother Colon cancer- Maternal grandfather  Social History: Last updated: Mar 20, 2007 Widowed Children: 2 Occupation: Lab Tech- Administrator, [employer] Never Smoked Alcohol use-no  Risk Factors: Smoking Status: never (Mar 20, 2007)  Review of Systems  The patient denies fever, weight loss, weight gain, vision loss, decreased hearing, hoarseness, chest pain, syncope, dyspnea on exertion, peripheral edema, prolonged cough, abdominal pain, incontinence, muscle weakness, depression, and enlarged lymph nodes.         Palpitations, near syncope, nausea, malaise  Vital Signs:  Patient profile:   77 year old female Height:      68 inches Weight:      174 pounds Pulse rate:   56 / minute Pulse rhythm:   regular BP sitting:   132 / 70  (left arm) Cuff size:   regular  Vitals Entered By: Stanton Kidney, EMT-P (Jan 07, 2010 2:51 PM)  Physical Exam  General:  Well developed, well nourished, in no acute distress. Head:  normocephalic and atraumatic Neck:  Neck supple, no JVD. No masses, thyromegaly or abnormal cervical nodes. Lungs:  Clear bilaterally to auscultation and percussion. Heart:  Non-displaced PMI, chest non-tender; regular rate and rhythm, S1, S2 without murmurs, rubs or gallops. Carotid upstroke normal, no bruit. Pedals normal pulses. No edema, no varicosities. Abdomen:  Bowel sounds positive; abdomen soft and non-tender without masses, organomegaly, or hernias noted. No hepatosplenomegaly. Msk:  Back normal, normal gait. Muscle strength and tone normal. Pulses:  pulses normal in all 4 extremities Extremities:  No clubbing or cyanosis. Neurologic:  Alert and oriented x 3. Skin:  Intact without lesions or rashes. Psych:  Normal affect.    EKG  Procedure date:   12/31/2009  Findings:      sinus bradycardia with rate 56 beats per minute, left axis deviation, left atrial enlargement, no other significant ST or T wave changes noted.  Impression & Recommendations:  Problem # 1:  SICK SINUS/ TACHY-BRADY SYNDROME (ICD-427.81) Leah Hayden appears to have sick sinus syndrome on her 48 hour Holter. she has at least 4 pauses estimated at 2.6 seconds each, frequent runs of narrow complex rhythm at 150 beats per minute that are nonsustained. These appear to be SVT. The Holter monitor has been shown to Dr. Graciela Husbands who has suggested that as she is asymptomatic, she be treated conservatively for now with low doses of metoprolol p.r.n. for rapid rhythms that do not resolve on her own. If she continues to have more symptoms and is more symptomatic, she will likely need a pacemaker.  The results have been discussed with her and her son and as she has only had one episode, we will  just monitor hert for now.I have asked her to contact our office if she has worsening symptoms of palpitations, nausea, lightheadedness.  She was started on warfarin by the emergency room after her story of tachypalpitations concerning for atrial fibrillation. Given that she is having so many episodes of narrow complex  rapid rhythm, I will continue her on warfarin for now. If she eventually has a pacemaker placed and rate control is obtained, we could discontinue the warfarin.  Her updated medication list for this problem includes:    Metoprolol Tartrate 25 Mg Tabs (Metoprolol tartrate) .Marland Kitchen... Take one tablet by mouth as needed    Warfarin Sodium 3 Mg Tabs (Warfarin sodium) ..... Use as directed by anticoagualtion clinic  Patient Instructions: 1)  Your physician recommends that you schedule a follow-up appointment in: 1 month

## 2010-09-15 NOTE — Medication Information (Signed)
Summary: Coumadin Clinic  Anticoagulant Therapy  Managed by: [name], RN, BSN Referring MD: Dr Mariah Milling PCP: Cindee Salt MD Supervising MD: Mariah Milling Indication 1: Atrial Fibrillation Lab Used: [facility] [place] Site: [place] INR POC 2.5 INR RANGE 2.0-3.0  Dietary changes: no    Health status changes: no    Bleeding/hemorrhagic complications: no    Recent/future hospitalizations: no    Any changes in medication regimen? no    Recent/future dental: no  Any missed doses?: no       Is patient compliant with meds? yes       Allergies: 1)  * Auramycin 2)  * Latex  Anticoagulation Management History:      The patient is taking warfarin and comes in today for a routine follow up visit.  Positive risk factors for bleeding include an age of 75 years or older.  The bleeding index is 'intermediate risk'.  Positive CHADS2 values include Age > 92 years old.  Her last INR was 2.45.  Anticoagulation responsible provider: [facility].  INR POC: 2.5.  Cuvette Lot#: [id number].  Exp: 05/2011.    Anticoagulation Management Assessment/Plan:      The patient's current anticoagulation dose is Warfarin sodium 3 mg tabs: Use as directed by Anticoagualtion Clinic.  The target INR is 2.0-3.0.  The next INR is due 04/06/2010.  Results were reviewed/authorized by [name], RN, BSN.  She was notified by [name] RN.         Prior Anticoagulation Instructions: INR 2.7  Start taking 1.5 tablets daily except 2 tablets on Tuesdays.  Recheck in 2 weeks.     Current Anticoagulation Instructions: INR 2.5  Continue on same dosage 1.5 tablets daily except 2 tablets on Tuesdays.  Recheck in 3 weeks.

## 2010-09-15 NOTE — Assessment & Plan Note (Signed)
Summary: cpx/bir   Vital Signs:  Patient Profile:   77 Years Old Female Height:     68 inches Weight:      165.13 pounds Temp:     98.3 degrees F oral Pulse rate:   64 / minute BP sitting:   124 / 60  (left arm)  Vitals Entered By: Wandra Mannan (March 14, 2007 8:22 AM)               Chief Complaint:  cpx.  History of Present Illness: Doing fine Occ some fatigue, esp in hot weather Still works full time, gardens, Catering manager  Current Allergies (reviewed today): * AURAMYCIN * LATEX  Past Medical History:    Reviewed history from 03/07/2007 and no changes required:       Allergic rhinitis       Asthma       Breast cancer, hx of       Colonic polyps, hx of       Diverticulosis, colon       GERD       ?Meniere's   Family History:    Reviewed history from 03/07/2007 and no changes required:       Father: Deceased- heart failure       Mother: Deceased-heart failure       Siblings: One brother deceased, one living with arthritis       DM- maternal grandmother       Gout/arthritis- mother       Colon cancer- Maternal grandfather  Social History:    Reviewed history from 03/07/2007 and no changes required:       Widowed       Children: 2       Occupation: Lab [employer]       Never Smoked       Alcohol use-no    Review of Systems  General      Denies sleep disorder and weight loss.      Wears seat belt  Eyes      Complains of blurring.      Denies double vision and vision loss-1 eye.      Eye exams have been benign Uses liquid tears as needed   ENT      Denies decreased hearing and ringing in ears.      teeth fine--keeps up with dentist  CV      Complains of palpitations.      Denies chest pain or discomfort, fainting, swelling of feet, and swelling of hands.      palpitations only if tired Stable DOE when walking stieps--she thinks she has good tolerance  Resp      Denies cough and shortness of breath.      still sees Dr  Tyler Callas  GI      Denies abdominal pain, constipation, dark tarry stools, indigestion, nausea, and vomiting.      Gets gas at times--esp after lunch No sugarless gum, candy or drinks  GU      gets some urgency but no incontinence  MS      Pain in DIP--left 3rd and right 2nd fingers--no meds needed Reduced left knee cartilage--glucosamine in past but hasn't needed lately  Derm      Denies lesion(s) and rash.      left temple lesion wants checked  Neuro      Denies headaches, numbness, tingling, and weakness.  Psych      Denies anxiety and depression.  Heme  Denies enlarge lymph nodes.      Bruises easy  Allergy      Complains of seasonal allergies.   Physical Exam  General:     alert and normal appearance.   Eyes:     pupils equal, pupils round, pupils reactive to light, and no optic disk abnormalities.   Ears:     R ear normal and L ear normal.   Mouth:     no lesions.   Neck:     supple, no masses, no thyromegaly, no carotid bruits, and no cervical lymphadenopathy.   Lungs:     normal respiratory effort and normal breath sounds.   Heart:     normal rate, regular rhythm, no murmur, and no gallop.   Abdomen:     soft, non-tender, no hepatomegaly, and no splenomegaly.   Msk:     scattered swelling in hand DIP/PIPs Pulses:     1+ in feet Extremities:     no edema Skin:     no rashes and no suspicious lesions.   superficial varicosities in calves and thighs Axillary Nodes:     No palpable lymphadenopathy Psych:     normally interactive, good eye contact, not anxious appearing, and not depressed appearing.      Impression & Recommendations:  Problem # 1:  Preventive Health Care (ICD-V70.0) Assessment: Comment Only Healthy check glucose  Problem # 2:  ASTHMA (ICD-493.90) Assessment: Unchanged well controlled  Dr Carthage Callas treats  Her updated medication list for this problem includes:    Albuterol 90 Mcg/act Aers (Albuterol) .Marland Kitchen... As needed     Singulair 10 Mg Tabs (Montelukast sodium) .Marland Kitchen... Take one by mouth once a day    Advair Diskus 100-50 Mcg/dose Misc (Fluticasone-salmeterol) ..... Use two times a day   Problem # 3:  ALLERGIC RHINITIS (ICD-477.9) doing okay with current Rx  Her updated medication list for this problem includes:    Zyrtec Allergy 10 Mg Tabs (Cetirizine hcl) .Marland Kitchen... Take one by mouth once a day    Nasonex 50 Mcg/act Susp (Mometasone furoate) ..... Use 1-2 sprays in each nostil once a day as needed   Complete Medication List: 1)  Albuterol 90 Mcg/act Aers (Albuterol) .... As needed 2)  Singulair 10 Mg Tabs (Montelukast sodium) .... Take one by mouth once a day 3)  Calcium + D  .... Take one by mouth once a day 4)  Advair Diskus 100-50 Mcg/dose Misc (Fluticasone-salmeterol) .... Use two times a day 5)  Quinerva 260 Mg Tabs (Quinine sulfate) .... Take one by mouth at bedtime as needed 6)  Multivitamins Tabs (Multiple vitamin) .... Take one by mouth once a day 7)  Zantac 75mg   .... Prn 8)  Zyrtec Allergy 10 Mg Tabs (Cetirizine hcl) .... Take one by mouth once a day 9)  Nasonex 50 Mcg/act Susp (Mometasone furoate) .... Use 1-2 sprays in each nostil once a day as needed  Other Orders: Venipuncture (98119) TLB-Glucose, QUANT ([id number])   Patient Instructions: 1)  Please schedule a follow-up appointment in 1 year.         Current Allergies (reviewed today): * AURAMYCIN * LATEX Current Medications (including changes made in today's visit):  ALBUTEROL 90 MCG/ACT AERS (ALBUTEROL) as needed SINGULAIR 10 MG TABS (MONTELUKAST SODIUM) Take one by mouth once a day * CALCIUM + D Take one by mouth once a day ADVAIR DISKUS 100-50 MCG/DOSE  MISC (FLUTICASONE-SALMETEROL) Use two times a day QUINERVA 260 MG  TABS (QUININE SULFATE) Take one  by mouth at bedtime as needed MULTIVITAMINS   TABS (MULTIPLE VITAMIN) Take one by mouth once a day * ZANTAC 75MG  prn ZYRTEC ALLERGY 10 MG  TABS (CETIRIZINE HCL) Take one by  mouth once a day NASONEX 50 MCG/ACT  SUSP (MOMETASONE FUROATE) Use 1-2 sprays in each nostil once a day as needed  Appended Document: cpx/bir      Current Allergies: * AURAMYCIN * LATEX      Physical Exam  Skin:     actinic on left temple    Impression & Recommendations:  Problem # 1:  ACTINIC KERATOSIS (ICD-702.0) Assessment: New Liquid nitrogen 30 seconds x 2 tolerated well Orders: EMR Misc Charge Code [facility])   Complete Medication List: 1)  Albuterol 90 Mcg/act Aers (Albuterol) .... As needed 2)  Singulair 10 Mg Tabs (Montelukast sodium) .... Take one by mouth once a day 3)  Calcium + D  .... Take one by mouth once a day 4)  Advair Diskus 100-50 Mcg/dose Misc (Fluticasone-salmeterol) .... Use two times a day 5)  Quinerva 260 Mg Tabs (Quinine sulfate) .... Take one by mouth at bedtime as needed 6)  Multivitamins Tabs (Multiple vitamin) .... Take one by mouth once a day 7)  Zantac 75mg   .... Prn 8)  Zyrtec Allergy 10 Mg Tabs (Cetirizine hcl) .... Take one by mouth once a day 9)  Nasonex 50 Mcg/act Susp (Mometasone furoate) .... Use 1-2 sprays in each nostil once a day as needed

## 2010-09-15 NOTE — Medication Information (Signed)
Summary: CCR/ALT   Anticoagulant Therapy  Managed by: Benedict Needy, RN Referring MD: Dr Mariah Milling PCP: Cindee Salt MD Supervising MD: Mariah Milling Indication 1: Atrial Fibrillation Lab Used: [facility]  Site: [place] INR POC 4.0 INR RANGE 2.0-3.0  Dietary changes: no    Health status changes: no    Bleeding/hemorrhagic complications: no     Any changes in medication regimen? no     Any missed doses?: no       Is patient compliant with meds? yes       Allergies: 1)  * Auramycin 2)  * Latex  Anticoagulation Management History:      The patient is taking warfarin and comes in today for a routine follow up visit.  Positive risk factors for bleeding include an age of 75 years or older.  The bleeding index is 'intermediate risk'.  Positive CHADS2 values include Age > 75 years old.  Her last INR was 2.45.  Anticoagulation responsible provider: Goll[facility].  INR POC: 4.0.  Cuvette Lot#: [id number].  Exp: 04/2011.    Anticoagulation Management Assessment/Plan:      The patient's current anticoagulation dose is Warfarin sodium 3 mg tabs: Use as directed by Anticoagualtion Clinic.  The target INR is 2.0-3.0.  The next INR is due 03/02/2010.  Results were reviewed/authorized by Benedict Needy, RN.  She was notified by Benedict Needy, RN.         Prior Anticoagulation Instructions: INR 2.45  Continue taking 1.5 tabs daily except 2 tabs on Tuesday and Saturday. Recheck in 3 weeks  Current Anticoagulation Instructions: INR 4.0  Do not take todays dose of coumadin. Start taking 1.5 tabs daily except 2 tabs on Saturday. Recheck in 1 week.

## 2010-09-15 NOTE — Medication Information (Signed)
Summary: CCR/NEE  Anticoagulant Therapy  Managed by: [name], RN, BSN Referring MD: Dr Mariah Milling PCP: Cindee Salt MD Supervising MD: Mariah Milling Indication 1: Atrial Fibrillation Lab Used: [facility] Site: [place] INR POC 2.7 INR RANGE 2.0-3.0           Allergies: 1)  * Auramycin 2)  * Latex  Anticoagulation Management History:      The patient is taking warfarin and comes in today for a routine follow up visit.  Positive risk factors for bleeding include an age of 75 years or older.  The bleeding index is 'intermediate risk'.  Positive CHADS2 values include Age > 39 years old.  Her last INR was 2.45.  Anticoagulation responsible provider: Gollan.  INR POC: 2.7.  Exp: 04/2011.    Anticoagulation Management Assessment/Plan:      The patient's current anticoagulation dose is Warfarin sodium 3 mg tabs: Use as directed by Anticoagualtion Clinic.  The target INR is 2.0-3.0.  The next INR is due 03/16/2010.  Results were reviewed/authorized by [name], RN, BSN.  She was notified by [name] RN.         Prior Anticoagulation Instructions: INR 4.0  Do not take todays dose of coumadin. Start taking 1.5 tabs daily except 2 tabs on Saturday. Recheck in 1 week.  Current Anticoagulation Instructions: INR 2.7  Start taking 1.5 tablets daily except 2 tablets on Tuesdays.  Recheck in 2 weeks.

## 2010-09-15 NOTE — Medication Information (Signed)
Summary: Coumadin Clinic   Anticoagulant Therapy  Managed by: Benedict Needy, RN Referring MD: Dr Mariah Milling PCP: Cindee Salt MD Supervising MD: Mariah Milling Indication 1: Atrial Fibrillation Lab Used: [facility] Site: [place] PT 26.4 INR POC 2.45 INR RANGE 2.0-3.0  Dietary changes: no     Bleeding/hemorrhagic complications: no     Any changes in medication regimen? no     Any missed doses?: no       Is patient compliant with meds? yes       Allergies: 1)  * Auramycin 2)  * Latex  Anticoagulation Management History:      Her anticoagulation is being managed by telephone today.  Positive risk factors for bleeding include an age of 75 years or older.  The bleeding index is 'intermediate risk'.  Positive CHADS2 values include Age > 33 years old.  Her last INR was 2.45.  Prothrombin time is 26.4.  Anticoagulation responsible provider: Gollan.  INR POC: 2.45.  Exp: 03/2011.    Anticoagulation Management Assessment/Plan:      The patient's current anticoagulation dose is Warfarin sodium 3 mg tabs: Use as directed by Anticoagualtion Clinic.  The target INR is 2.0-3.0.  The next INR is due 02/21/2010.  Results were reviewed/authorized by Benedict Needy, RN.  She was notified by Benedict Needy, RN.         Prior Anticoagulation Instructions: INR 2.46  Start taking 1.5 tablets daily except for 2 tablets on Tuesdays and Saturdays. Recheck in 2 weeks.  Current Anticoagulation Instructions: INR 2.45  Continue taking 1.5 tabs daily except 2 tabs on Tuesday and Saturday. Recheck in 3 weeks

## 2010-09-15 NOTE — Assessment & Plan Note (Signed)
Summary: inner ear infection/nt   Vital Signs:  Patient profile:   77 year old female Height:      68 inches Weight:      168.25 pounds BMI:     25.67 Temp:     98.6 degrees F oral Pulse rate:   60 / minute Pulse (ortho):   60 / minute Pulse rhythm:   regular BP sitting:   142 / 80  (left arm) BP standing:   134 / 70 Cuff size:   regular  Vitals Entered By: Selena Batten Dance CMA Duncan Dull) (August 09, 2010 3:01 PM)  Serial Vital Signs/Assessments:  Time      Position  BP       Pulse  Resp  Temp     By 3:48 PM   Lying LA  136/72   60                    Kim Dance CMA (AAMA) 3:48 PM   Sitting   134/72   58                    Kim Dance CMA (AAMA) 3:48 PM   Standing  134/70   60                    Kim Dance CMA (AAMA)  CC: ? Inner ear issues   History of Present Illness: CC: ? inner ear issue  8-9d h/o "room spinning around me" when stis up or stands up quickly.  Also with L side of face really sore when bathing as well as L gum.  Tries to go slowly, stands a few seconds before moving.    Worked today, works at [employer] in [employer].  No rashes, itching in ear, oral lesions, fevers/chills, nausea/vomiting.  No hearing changes.  No ringing in ears.  no recent colds, viral illnesses.  No lightheadedness or presyncope.  bp last tuesday 152/78 a bit high for her.  Not on bp meds.    h/o Afib on metoprolol as needed and coumadin daily.     Current Medications (verified): 1)  Proventil Hfa 108 (90 Base) Mcg/act Aers (Albuterol Sulfate) .... Take As Needed 2)  Singulair 10 Mg Tabs (Montelukast Sodium) .... Take One By Mouth Once A Day 3)  Advair Diskus 100-50 Mcg/dose  Misc (Fluticasone-Salmeterol) .... Use Two Times A Day 4)  Nasonex 50 Mcg/act  Susp (Mometasone Furoate) .... Use 1-2 Sprays in Each Nostil Once A Day As Needed 5)  Metoprolol Tartrate 25 Mg Tabs (Metoprolol Tartrate) .... Take One Tablet By Mouth As Needed 6)  Warfarin Sodium 3 Mg Tabs (Warfarin  Sodium) .... Use As Directed By Anticoagualtion Clinic 7)  Zaditor 0.025 % Soln (Ketotifen Fumarate) .... Use 1 Drop Per Eye Two Times A Day 8)  Multivitamins   Tabs (Multiple Vitamin) .... Take One By Mouth Once A Day 9)  Calcium-Vitamin D 250-125 Mg-Unit Tabs (Calcium Carbonate-Vitamin D) .... Take 1 By Mouth Once Daily 10)  Tylenol 325 Mg Tabs (Acetaminophen) .... 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   Head:  Normocephalic and atraumatic without obvious abnormalities. No apparent alopecia or balding.  no sinus tenderness Eyes:  pupils equal, pupils round, pupils reactive to light Ears:  R ear normal and L ear normal.  cerumen disimpaction performed on R ear Nose:  nares clear Mouth:  no erythema and no lesions.   Neck:  supple, no masses, no thyromegaly, no carotid bruits, and no cervical lymphadenopathy.   Lungs:  normal respiratory effort, normal breath sounds, and no wheezes.   Heart:  normal rate, regular rhythm, no murmur, and no gallop.   Pulses:  2+ rad pulses Extremities:  no pedal edema Neurologic:  CN 2-12 intact, sensation and strength intact, negative romburg.  Normal FTN, HTS.  + vertigo with rapid changes in head position, upon first standing up and dix hallpike + with nystagmus on L.   Impression & Recommendations:  Problem # 1:  BENIGN POSITIONAL VERTIGO (ICD-386.11) likely this dx as dix hallpike positive.  no syncope/lightheadedness (although does have h/o sick sinus).  treat with  meclizine, sent home with handout on epley.  if not better, consider PT vestibular rehab.  Demonstrated maneuvers to self-treat vertigo. Patient to call to be seen if no improvement in 10-14 days, sooner if worse.  orthostatics negative.  if worse, return for further eval, EKG, labwork.  Her updated medication list for this problem includes:    Antivert 25 Mg Tabs (Meclizine hcl) ..... One by mouth q6 hours as needed nausea  Complete Medication List: 1)  Proventil Hfa 108 (90 Base) Mcg/act Aers (Albuterol sulfate) .... Take as needed 2)  Singulair 10 Mg Tabs (Montelukast sodium) .... Take one by mouth once a day 3)  Advair Diskus 100-50 Mcg/dose Misc (Fluticasone-salmeterol) .... Use two times a day 4)  Nasonex 50 Mcg/act Susp (Mometasone furoate) .... Use 1-2 sprays in each nostil once a day as needed 5)  Metoprolol Tartrate 25 Mg Tabs (Metoprolol tartrate) .... Take one tablet by mouth as needed 6)  Warfarin Sodium 3 Mg Tabs (Warfarin sodium) .... Use as directed by anticoagualtion clinic 7)  Zaditor 0.025 % Soln (Ketotifen fumarate) .... Use 1 drop per eye two times a day 8)  Multivitamins Tabs (Multiple vitamin) .... Take one by mouth once a day 9)  Calcium-vitamin D 250-125 Mg-unit Tabs (Calcium carbonate-vitamin d) .... Take 1 by mouth once daily 10)  Tylenol 325 Mg Tabs (Acetaminophen) .... As directed  as needed 11)  Antivert 25 Mg Tabs (Meclizine hcl) .... One by mouth q6 hours as needed nausea  Patient Instructions: 1)  I think you have BPPV, handout provided. 2)  Let us know if not better and we can send you to vestibular rehab. 3)  Meclizine for nausea if needed. Prescriptions: ANTIVERT 25 MG TABS (MECLIZINE HCL) one by mouth q6 hours as needed nausea  #30 x 0   Entered and Authorized by:   Eustaquio Boyden  MD   Signed by:   Eustaquio Boyden  MD on 08/09/2010   Method used:   Electronically to        [facility]  [address] (retail)       [address]        [place], [place]  [address]       Ph: [phone] or [phone]       Fax: [phone]   RxID:   [id number]    Orders Added: 1)  Est. Patient  Level III [04540]    Current Allergies (reviewed today): * AURAMYCIN * LATEX

## 2010-09-15 NOTE — Medication Information (Signed)
Summary: rov/ewj  Anticoagulant Therapy  Managed by: Bethena Midget, RN, BSN Referring MD: Dr Mariah Milling PCP: Cindee Salt MD Supervising MD: Mariah Milling Indication 1: Atrial Fibrillation Lab Used: [facility] Site: [place] INR POC 2.2 INR RANGE 2.0-3.0  Dietary changes: no    Health status changes: no    Bleeding/hemorrhagic complications: no    Recent/future hospitalizations: no    Any changes in medication regimen? no    Recent/future dental: no  Any missed doses?: no       Is patient compliant with meds? yes       Allergies: 1)  * Auramycin 2)  * Latex  Anticoagulation Management History:      The patient is taking warfarin and comes in today for a routine follow up visit.  Positive risk factors for bleeding include an age of 75 years or older.  The bleeding index is 'intermediate risk'.  Positive CHADS2 values include Age > 75 years old.  Her last INR was 3.6.  Anticoagulation responsible provider: Gollan.  INR POC: 2.2.  Cuvette Lot#: [id number].  Exp: 07/2011.    Anticoagulation Management Assessment/Plan:      The patient's current anticoagulation dose is Warfarin sodium 3 mg tabs: Use as directed by Anticoagualtion Clinic.  The target INR is 2.0-3.0.  The next INR is due 07/20/2010.  Anticoagulation instructions were given to patient.  Results were reviewed/authorized by Bethena Midget, RN, BSN.  She was notified by Bethena Midget, RN, BSN.         Prior Anticoagulation Instructions: INR 1.6  Take 2 tablets today, then start taking 1.5 tablets daily except 2 tablets on Tuesdays.  Recheck in 2-3 weeks.   Current Anticoagulation Instructions: INR 2.2 Continue 1.5 pills everyday except 2 pills on Tuesdays. Recheck in 3 weeks.

## 2010-09-15 NOTE — Letter (Signed)
Summary: [facility]   Allergy & Asthma   Imported By: Lanelle Bal 10/22/2009 12:49:18  _____________________________________________________________________  External Attachment:    Type:   Image     Comment:   External Document

## 2010-09-15 NOTE — Assessment & Plan Note (Signed)
Summary: ROA 6 MTHS CYD   Vital Signs:  Patient profile:   77 year old female Weight:      170 pounds Temp:     98.5 degrees F oral Pulse rate:   60 / minute Pulse rhythm:   regular BP sitting:   130 / 60  (left arm) Cuff size:   regular  Vitals Entered By: Mervin Hack CMA Duncan Dull) (July 27, 2009 8:54 AM) CC: 6 month follow-up   History of Present Illness: doing well  Having trouble with her eyes Saw Dr Fransico Michael at [facility] (the younger) Got meds that she was allergic to Dx of infected eyelids from dry eyes Notes some blurry vision Aching at the end of the day Trying OTC itch drops with little relief Light sensitive--with microscope at times   allergies ongoing satisfied with this Rx Follows with Dr [place] Callas  Asthma is quiet covers face in cold No regular cough or SOB occ sneezing  Mild arthritis symptoms Still some pain in right shoulder but mostly excellant result from the surgery uses ibuprofen as needed   some gas but stomach generally okay  Allergies: 1)  * Auramycin 2)  * Latex  Past History:  Past medical, surgical, family and social histories (including risk factors) reviewed for relevance to current acute and chronic problems.  Past Medical History: Reviewed history from 12/18/2007 and no changes required. Allergic rhinitis Asthma Breast cancer, hx of Colonic polyps, hx of Diverticulosis, colon GERD ?Meniere's Osteoarthritis  Past Surgical History: Reviewed history from 08/12/2008 and no changes required. Mastectomy, left 1984 Mastectomy, right 1985 Hysterectomy, partial with bladder tack 1966 Correction hammer toe left 2nd toe 1997 EGD- inflam only 03/01 Left toe surgery (2nd/3rd) 2001/2002 10/09 right rotator cuff surgery  Family History: Reviewed history from 03/07/2007 and no changes required. Father: Deceased- heart failure Mother: Deceased-heart failure Siblings: One brother deceased, one living with arthritis DM-  maternal grandmother Gout/arthritis- mother Colon cancer- Maternal grandfather  Social History: Reviewed history from 03/07/2007 and no changes required. Widowed Children: 2 Occupation: Lab [employer] Never Smoked Alcohol use-no  Review of Systems       still works full time appetite is great Weight down 4# since summer sleeps well No sig anxiety or depression---still grieves her husband's suicide  Physical Exam  General:  alert and normal appearance.   Neck:  supple, no masses, no thyromegaly, no carotid bruits, and no cervical lymphadenopathy.   Lungs:  normal respiratory effort, normal breath sounds, no crackles, and no wheezes.   Heart:  normal rate, regular rhythm, no murmur, and no gallop.   Abdomen:  soft and non-tender.   Msk:  no joint tenderness and no joint swelling.   Extremities:  no edema Psych:  normally interactive, good eye contact, not anxious appearing, and not depressed appearing.     Impression & Recommendations:  Problem # 1:  OSTEOARTHRITIS (ICD-715.90) Assessment Comment Only doing fairly well no regular meds  Her updated medication list for this problem includes:    Ibuprofen 600 Mg Tabs (Ibuprofen) .Marland Kitchen... As needed for rotator cuff surgery  Problem # 2:  GERD (ICD-530.81) Assessment: Unchanged  fairly quiet mostly gas  Orders: Venipuncture (16109) TLB-Renal Function Panel (80069-RENAL) TLB-CBC Platelet - w/Differential (85025-CBCD) TLB-Hepatic/Liver Function Pnl (80076-HEPATIC) TLB-TSH (Thyroid Stimulating Hormone) (84443-TSH)  Problem # 3:  ASTHMA (ICD-493.90) Assessment: Unchanged mild and intermittent symptoms--like with cold exposure doing well  Her updated medication list for this problem includes:    Albuterol 90 Mcg/act  Aers (Albuterol) .Marland Kitchen... As needed    Singulair 10 Mg Tabs (Montelukast sodium) .Marland Kitchen... Take one by mouth once a day    Advair Diskus 100-50 Mcg/dose Misc (Fluticasone-salmeterol) ..... Use two  times a day  Problem # 4:  ALLERGIC RHINITIS (ICD-477.9) Assessment: Unchanged fair control sees allergist \ Her updated medication list for this problem includes:    Zyrtec Allergy 10 Mg Tabs (Cetirizine hcl) .Marland Kitchen... Take one by mouth once a day    Nasonex 50 Mcg/act Susp (Mometasone furoate) ..... Use 1-2 sprays in each nostil once a day as needed  Complete Medication List: 1)  Albuterol 90 Mcg/act Aers (Albuterol) .... As needed 2)  Singulair 10 Mg Tabs (Montelukast sodium) .... Take one by mouth once a day 3)  Calcium + D  .... Take one by mouth once a day 4)  Advair Diskus 100-50 Mcg/dose Misc (Fluticasone-salmeterol) .... Use two times a day 5)  Multivitamins Tabs (Multiple vitamin) .... Take one by mouth once a day 6)  Zyrtec Allergy 10 Mg Tabs (Cetirizine hcl) .... Take one by mouth once a day 7)  Nasonex 50 Mcg/act Susp (Mometasone furoate) .... Use 1-2 sprays in each nostil once a day as needed 8)  Vitamin C 500 Mg Tabs (Ascorbic acid) .... Take 1 tablet by mouth once a day 9)  Ibuprofen 600 Mg Tabs (Ibuprofen) .... As needed for rotator cuff surgery 10)  Ciprofloxacin Hcl 250 Mg Tabs (Ciprofloxacin hcl) .Marland Kitchen.. 1 tab two times a day for bladder infection  Patient Instructions: 1)  Please schedule a follow-up appointment in 1 year.   Current Allergies (reviewed today): * AURAMYCIN * LATEX

## 2010-09-15 NOTE — Medication Information (Signed)
Summary: ccr   Anticoagulant Therapy  Managed by: [name], RN, BSN Referring MD: Dr Mariah Milling PCP: Cindee Salt MD Supervising MD: Mariah Milling Indication 1: Atrial Fibrillation Lab Used: [facility] Site:  INR RANGE 2.0-3.0   Health status changes: no     Recent/future hospitalizations: no    Any changes in medication regimen? no     Any missed doses?: no       Is patient compliant with meds? yes       Allergies: 1)  * Auramycin 2)  * Latex  Anticoagulation Management History:      The patient is taking warfarin and comes in today for a routine follow up visit.  Positive risk factors for bleeding include an age of 27 years or older.  The bleeding index is 'intermediate risk'.  Positive CHADS2 values include Age > 15 years old.  Her last INR was 2.45 and today's INR is 3.6.  Anticoagulation responsible provider: Goll[place].  Cuvette Lot#: [id number].  Exp: 05/2011.    Anticoagulation Management Assessment/Plan:      The patient's current anticoagulation dose is Warfarin sodium 3 mg tabs: Use as directed by Anticoagualtion Clinic.  The target INR is 2.0-3.0.  The next INR is due 05/13/2010.  Results were reviewed/authorized by [name], RN, BSN.  She was notified by Benedict Needy, RN.         Prior Anticoagulation Instructions: INR 2.0  Take 2 tablets today, then resume same dosage 1.5 tablets daily except 2 tablets on Tuesdays.  Recheck in 4 weeks.    Current Anticoagulation Instructions: INR 3.6  DO NOT TAKE COUMADIN TODAY. Start taking 1.5 tabs daily. Recheck in 10 days.

## 2010-09-15 NOTE — Medication Information (Signed)
Summary: [facility]/Signed and Faxed 07.27.09  [facility]/Signed and Faxed 07.27.09   Imported By: Mickle Asper 03/09/2008 11:55:13  _____________________________________________________________________  External Attachment:    Type:   Image     Comment:   External Document

## 2010-09-15 NOTE — Consult Note (Signed)
Summary: [facility]/Consultation Report/Dr. Sypher  [facility]/Consultation Report/Dr. Sypher   Imported By: Mickle Asper 06/18/2008 16:25:45  _____________________________________________________________________  External Attachment:    Type:   Image     Comment:   External Document  Appended Document: [facility]/Consultation Report/Dr. Sypher doing well post op has 1 last follow up in 2 weeks last Rx for dilaudid given

## 2010-09-15 NOTE — Assessment & Plan Note (Signed)
Summary: 12:30 ?UTI/CLE   Vital Signs:  Patient profile:   77 year old female Weight:      174 pounds BMI:     26.95 Temp:     98.7 degrees F oral Pulse rate:   70 / minute Pulse rhythm:   regular BP sitting:   138 / 60  (left arm) Cuff size:   regular  Vitals Entered By: Mervin Hack CMA (January 13, 2009 12:53 PM)  History of Present Illness: Chief Complaint: UTI  Started with mild urinary symptoms last Thursday (6-7 days ago) tried increased fluids and cranberry juice Felt a little bad at first has been feeling tired today--didn't rest well feels stinging in feet and legs Gets urgency and some increased freq No hematuria  Allergies: 1)  * Auramycin 2)  * Latex  Past History:  Past medical, surgical, family and social histories (including risk factors) reviewed for relevance to current acute and chronic problems.  Past Medical History: Reviewed history from 12/18/2007 and no changes required. Allergic rhinitis Asthma Breast cancer, hx of Colonic polyps, hx of Diverticulosis, colon GERD ?Meniere's Osteoarthritis  Past Surgical History: Reviewed history from 08/12/2008 and no changes required. Mastectomy, left 1984 Mastectomy, right 1985 Hysterectomy, partial with bladder tack 1966 Correction hammer toe left 2nd toe 1997 EGD- inflam only 03/01 Left toe surgery (2nd/3rd) 2001/2002 10/09 right rotator cuff surgery  Family History: Reviewed history from 03/07/2007 and no changes required. Father: Deceased- heart failure Mother: Deceased-heart failure Siblings: One brother deceased, one living with arthritis DM- maternal grandmother Gout/arthritis- mother Colon cancer- Maternal grandfather  Social History: Reviewed history from 03/07/2007 and no changes required. Widowed Children: 2 Occupation: [employer] Never Smoked Alcohol use-no  Review of Systems       no nausea or vomiting appetite is okay Has had easier bruising Back  to work part time since January and full time in February or so  Physical Exam  General:  alert and normal appearance.   Abdomen:  soft and non-tender.   slight suprapubic sensititivity Msk:  no CVA tenderness   Impression & Recommendations:  Problem # 1:  ACUTE CYSTITIS (ICD-595.0) Assessment Comment Only  has been recurrent will rx with cipro 3 days may be enough  The following medications were removed from the medication list:    Amoxicillin 500 Mg Caps (Amoxicillin) .Marland Kitchen... 2 tab by mouth two times a day x 10 day Her updated medication list for this problem includes:    Ciprofloxacin Hcl 250 Mg Tabs (Ciprofloxacin hcl) .Marland Kitchen... 1 tab two times a day for bladder infection  Orders: UA Dipstick W/ Micro (manual) (16109)  Complete Medication List: 1)  Albuterol 90 Mcg/act Aers (Albuterol) .... As needed 2)  Singulair 10 Mg Tabs (Montelukast sodium) .... Take one by mouth once a day 3)  Calcium + D  .... Take one by mouth once a day 4)  Advair Diskus 100-50 Mcg/dose Misc (Fluticasone-salmeterol) .... Use two times a day 5)  Multivitamins Tabs (Multiple vitamin) .... Take one by mouth once a day 6)  Zyrtec Allergy 10 Mg Tabs (Cetirizine hcl) .... Take one by mouth once a day 7)  Nasonex 50 Mcg/act Susp (Mometasone furoate) .... Use 1-2 sprays in each nostil once a day as needed 8)  Vitamin C 500 Mg Tabs (Ascorbic acid) .... Take 1 tablet by mouth once a day 9)  Ibuprofen 600 Mg Tabs (Ibuprofen) .... As needed for rotator cuff surgery 10)  Ciprofloxacin Hcl 250 Mg  Tabs (Ciprofloxacin hcl) .Marland Kitchen.. 1 tab two times a day for bladder infection  Patient Instructions: 1)  Use the cipro for the bladder infection. If your symptoms resolve after 1-2 doses, it is okay to only take the medicine for 3 days 2)  Please schedule a follow-up appointment in 6 months .  Prescriptions: CIPROFLOXACIN HCL 250 MG TABS (CIPROFLOXACIN HCL) 1 tab two times a day for bladder infection  #20 x 0   Entered and  Authorized by:   Cindee Salt MD   Signed by:   Cindee Salt MD on 01/13/2009   Method used:   Electronically to        [facility]  [id number] (retail)       [address]       [place], [place]  [address]       Ph: [phone] or [phone]       Fax: [phone]   RxID:   [id number]   Current Allergies (reviewed today): * AURAMYCIN * LATEX  Laboratory Results   Urine Tests  Date/Time Received: January 13, 2009 1:01 PM Date/Time Reported: January 13, 2009 1:01 PM  Routine Urinalysis   Color: yellow Appearance: Clear Glucose: negative   (Normal Range: Negative) Bilirubin: negative   (Normal Range: Negative) Ketone: negative   (Normal Range: Negative) Spec. Gravity: 1.025   (Normal Range: 1.003-1.035) Blood: negative   (Normal Range: Negative) pH: 5.5   (Normal Range: 5.0-8.0) Protein: trace   (Normal Range: Negative) Urobilinogen: 0.2   (Normal Range: 0-1) Nitrite: negative   (Normal Range: Negative) Leukocyte Esterace: small   (Normal Range: Negative)  Urine Microscopic WBC/HPF: 3-6 RBC/HPF: rare Bacteria/HPF: 1+ Epithelial/HPF: [employer]

## 2010-09-15 NOTE — Assessment & Plan Note (Signed)
Summary: 6 WEEK FOLLOW UP/[facility]   Vital Signs:  Patient profile:   77 year old female Weight:      170.25 pounds Temp:     98.5 degrees F oral Pulse rate:   60 / minute Pulse rhythm:   regular BP sitting:   122 / 62  (left arm) Cuff size:   regular  Vitals Entered By: Sydell Axon LPN (March 21, 2010 2:52 PM) CC: 6 Week follow-up   History of Present Illness: Back is much better Finished with therapy last week Now continuing with home exercises--stretching, etc Has continued working full time  Pain in right leg at first---burning. This is better now Occ night cramps in legs--eases up with rubbing or walking some  Weakness in right leg is better no problems on steps now  Allergies: 1)  * Auramycin 2)  * Latex  Past History:  Past medical, surgical, family and social histories (including risk factors) reviewed for relevance to current acute and chronic problems.  Past Medical History: Reviewed history from 02/07/2010 and no changes required. Allergic rhinitis Asthma Breast cancer, hx of Colonic polyps, hx of Diverticulosis, colon GERD ?Meniere's Osteoarthritis Sick sinus/tachy-brady syndrome  Past Surgical History: Reviewed history from 08/12/2008 and no changes required. Mastectomy, left 1984 Mastectomy, right 1985 Hysterectomy, partial with bladder tack 1966 Correction hammer toe left 2nd toe 1997 EGD- inflam only 03/01 Left toe surgery (2nd/3rd) 2001/2002 10/09 right rotator cuff surgery  Family History: Reviewed history from 03/07/2007 and no changes required. Father: Deceased- heart failure Mother: Deceased-heart failure Siblings: One brother deceased, one living with arthritis DM- maternal grandmother Gout/arthritis- mother Colon cancer- Maternal grandfather  Social History: Reviewed history from 03/07/2007 and no changes required. 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   Msk:  no joint tenderness and no joint swelling.   Mild tenderness over sacrum SLR negative bilaterally Neurologic:  strength normal in all extremities and gait normal.     Impression & Recommendations:  Problem # 1:  BACK PAIN (ICD-724.5) Assessment Improved seems isolated to sacrum now but no history of fall or trauma No limitations now only occ uses iburpofen she will continue her regular exercises now  Her updated medication list for this problem includes:    Ibuprofen 600 Mg Tabs (Ibuprofen) .Marland Kitchen... As needed for rotator cuff surgery  Complete Medication List: 1)  Proventil Hfa 108 (90 Base) Mcg/act Aers (Albuterol sulfate) .... Take as needed 2)  Singulair 10 Mg Tabs (Montelukast sodium) .... Take one by mouth once a day 3)  Advair Diskus 100-50 Mcg/dose Misc (Fluticasone-salmeterol) .... Use two times a day 4)  Nasonex 50 Mcg/act Susp (Mometasone furoate) .... Use 1-2 sprays in each nostil once a day as needed 5)  Metoprolol Tartrate 25 Mg Tabs (Metoprolol tartrate) .... Take one tablet by mouth as needed 6)  Warfarin Sodium 3 Mg Tabs (Warfarin sodium) .... Use as directed by anticoagualtion clinic 7)  Zaditor 0.025 % Soln (Ketotifen fumarate) .... Use 1 drop per eye two times a day 8)  Multivitamins Tabs (Multiple vitamin) .... Take one by mouth once a day 9)  Ibuprofen 600 Mg Tabs (Ibuprofen) .... As needed for rotator cuff surgery 10)  Calcium-vitamin D 250-125 Mg-unit Tabs (Calcium carbonate-vitamin d) .... Take 1 by  mouth once daily  Patient Instructions: 1)  Please schedule a follow-up appointment in 6 months .   Current Allergies (reviewed today): * AURAMYCIN * LATEX

## 2010-09-15 NOTE — Consult Note (Signed)
Summary: [facility]/Consultation Report/Dr. Claudette Laws [facility]/Consultation Report/Dr. [place] Callas   Imported By: Mickle Asper 07/18/2007 14:57:27  _____________________________________________________________________  External Attachment:    Type:   Image     Comment:   External Document

## 2010-09-15 NOTE — Assessment & Plan Note (Signed)
Summary: HIP AND LEG PAIN/DLO   Vital Signs:  Patient profile:   77 year old female Weight:      171 pounds Temp:     98.3 degrees F oral Pulse rate:   60 / minute Pulse rhythm:   regular BP sitting:   130 / 70  (left arm) Cuff size:   regular  Vitals Entered By: Mervin Hack CMA Duncan Dull) (February 07, 2010 11:16 AM) CC: hip & leg pain   History of Present Illness: Heart symptoms seem to have settled down no recent problems  Now having low back pain--starts at sacrum and goes down legs Hard to even walk up steps---okay going down though has a weak feeling Started 3 weeks ago No known injury may have had some milder symptoms that go further back Variable pain from day to day Needs pillow between legs to hlep comfort  right leg does feel slightly weak Fell 2 days ago--shoe slipped in soil in garden--no injury  No sensory changes  Has tried tylenol and that gives some relief  Allergies: 1)  * Auramycin 2)  * Latex  Past History:  Past medical, surgical, family and social histories (including risk factors) reviewed for relevance to current acute and chronic problems.  Past Medical History: Allergic rhinitis Asthma Breast cancer, hx of Colonic polyps, hx of Diverticulosis, colon GERD ?Meniere's Osteoarthritis Sick sinus/tachy-brady syndrome  Past Surgical History: Reviewed history from 08/12/2008 and no changes required. Mastectomy, left 1984 Mastectomy, right 1985 Hysterectomy, partial with bladder tack 1966 Correction hammer toe left 2nd toe 1997 EGD- inflam only 03/01 Left toe surgery (2nd/3rd) 2001/2002 10/09 right rotator cuff surgery  Family History: Reviewed history from 03/07/2007 and no changes required. Father: Deceased- heart failure Mother: Deceased-heart failure Siblings: One brother deceased, one living with arthritis DM- maternal grandmother Gout/arthritis- mother Colon cancer- Maternal grandfather  Social History: Reviewed history  from 03/07/2007 and no changes required. 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   Msk:  slight sacral tenderness No S-I tenderness fair ROM at hips SLR negative bilat Neurologic:  mild weakness extension at right hip and flexion at right knee Gait --drags right leg slightly reflexes not elicited   Impression & Recommendations:  Problem # 1:  BACK PAIN (ICD-724.5) Assessment Comment Only with some weakness in right leg spine films look okay--will await radiologist eval will continue regular tylenol PT referral  Orders: T-Lumbar Spine 2 Views (72100TC) Physical Therapy Referral (PT)  Complete Medication List: 1)  Proventil Hfa 108 (90 Base) Mcg/act Aers (Albuterol sulfate) .... Take as needed 2)  Singulair 10 Mg Tabs (Montelukast sodium) .... Take one by mouth once a day 3)  Advair Diskus 100-50 Mcg/dose Misc (Fluticasone-salmeterol) .... Use two times a day 4)  Nasonex 50 Mcg/act Susp (Mometasone furoate) .... Use 1-2 sprays in each nostil once a day as needed 5)  Metoprolol Tartrate 25 Mg Tabs (Metoprolol tartrate) .... Take one tablet by mouth as needed 6)  Warfarin Sodium 3 Mg Tabs (Warfarin sodium) .... Use as directed by anticoagualtion clinic 7)  Zaditor 0.025 % Soln (Ketotifen fumarate) .... Use 1 drop per eye two times a day 8)  Multivitamins Tabs (Multiple vitamin) .... Take one by mouth once a day 9)  Ibuprofen 600 Mg Tabs (Ibuprofen) .... As needed for  rotator cuff surgery 10)  Calcium-vitamin D 250-125 Mg-unit Tabs (Calcium carbonate-vitamin d) .... Take 1 by mouth once daily  Patient Instructions: 1)  Please take tylenol regularly---650mg  three times a day  2)  Please set up Physical therapy evaluation 3)  Please schedule a follow-up appointment in 6 weeks.   Current  Allergies (reviewed today): * AURAMYCIN * LATEX

## 2010-09-15 NOTE — Assessment & Plan Note (Signed)
Summary: CPX / LFW   Vital Signs:  Patient profile:   77 year old female Weight:      172 pounds Temp:     98.5 degrees F oral Pulse rate:   60 / minute Pulse rhythm:   regular BP sitting:   138 / 68  (left arm) Cuff size:   regular  Vitals Entered By: Mervin Hack CMA Duncan Dull) (September 15, 2009 8:36 AM) CC: adult physical   History of Present Illness: Shoulder finally doing better  recent strep throat this has resolved now  Sees Dr [place] Callas next week Breathing okay No sig allergy issues  Allergies: 1)  * Auramycin 2)  * Latex  Past History:  Past medical, surgical, family and social histories (including risk factors) reviewed for relevance to current acute and chronic problems.  Past Medical History: Reviewed history from 12/18/2007 and no changes required. Allergic rhinitis Asthma Breast cancer, hx of Colonic polyps, hx of Diverticulosis, colon GERD ?Meniere's Osteoarthritis  Past Surgical History: Reviewed history from 08/12/2008 and no changes required. Mastectomy, left 1984 Mastectomy, right 1985 Hysterectomy, partial with bladder tack 1966 Correction hammer toe left 2nd toe 1997 EGD- inflam only 03/01 Left toe surgery (2nd/3rd) 2001/2002 10/09 right rotator cuff surgery  Family History: Reviewed history from 03/07/2007 and no changes required. Father: Deceased- heart failure Mother: Deceased-heart failure Siblings: One brother deceased, one living with arthritis DM- maternal grandmother Gout/arthritis- mother Colon cancer- Maternal grandfather  Social History: Reviewed history from 03/07/2007 and no changes required. 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 Eyes:  Denies double vision and vision loss-1 eye; having some dry eye issues--seems worse at work (chemical exposure) Early  cataracts. ENT:  Denies decreased hearing and ringing in ears; teeth okay --regular with dentist. CV:  Complains of palpitations and shortness of breath with exertion; denies chest pain or discomfort, difficulty breathing at night, difficulty breathing while lying down, fainting, and lightheadness; occ flutter when tired at night--very milid Stable DOE on steps--stamina is fine though. Resp:  Denies cough and shortness of breath. GI:  Complains of constipation; denies bloody stools, change in bowel habits, dark tarry stools, indigestion, nausea, and vomiting; occ uses senekot and stool softener. GU:  Denies dysuria and incontinence; sig AM urgency only. MS:  Complains of joint pain; denies joint swelling; only mild aches and pains occ ibuprofen. Derm:  Complains of dryness and lesion(s); denies rash; raised flaky lesion on right forearm. Neuro:  Denies headaches, numbness, tingling, and weakness. Psych:  Denies anxiety and depression. Heme:  Denies abnormal bruising and enlarge lymph nodes. Allergy:  Complains of seasonal allergies and sneezing.  Physical Exam  General:  alert and normal appearance.   Eyes:  pupils equal, pupils round, pupils reactive to light, and no optic disk abnormalities.   Ears:  R ear normal and L ear normal.   Mouth:  no erythema and no lesions.   Neck:  supple, no masses, no thyromegaly, no carotid bruits, and no cervical lymphadenopathy.   Lungs:  normal respiratory effort, normal breath sounds, and no wheezes.   Heart:  normal rate, regular rhythm, no murmur, and no gallop.   Abdomen:  soft and non-tender.   Msk:  no joint tenderness and no joint swelling.   Pulses:  normal in feet Extremities:  no edema Neurologic:  alert & oriented X3 and strength normal in all  extremities.   Skin:  actinic on right forearm scattered cherry angiomas Axillary Nodes:  No palpable lymphadenopathy Psych:  normally interactive, good eye contact, not anxious appearing, and not  depressed appearing.     Impression & Recommendations:  Problem # 1:  HEALTH MAINTENANCE EXAM (ICD-V70.0) Assessment Comment Only healthy counselling done  Problem # 2:  ASTHMA (ICD-493.90) Assessment: Unchanged well controlled follows with Dr Yoder Callas  Her updated medication list for this problem includes:    Albuterol 90 Mcg/act Aers (Albuterol) .Marland Kitchen... As needed    Singulair 10 Mg Tabs (Montelukast sodium) .Marland Kitchen... Take one by mouth once a day    Advair Diskus 100-50 Mcg/dose Misc (Fluticasone-salmeterol) ..... Use two times a day  Problem # 3:  ALLERGIC RHINITIS (ICD-477.9) Assessment: Unchanged doing okay with Rx  Her updated medication list for this problem includes:    Zyrtec Allergy 10 Mg Tabs (Cetirizine hcl) .Marland Kitchen... Take one by mouth once a day as needed    Nasonex 50 Mcg/act Susp (Mometasone furoate) ..... Use 1-2 sprays in each nostil once a day as needed  Problem # 4:  ACTINIC KERATOSIS (ICD-702.0) Assessment: Comment Only  liquid nitrogen Rx 40 seconds x 2 tolerated well  Orders: Cryotherapy/Destruction benign or premalignant lesion (1st lesion)  (17000)  Complete Medication List: 1)  Albuterol 90 Mcg/act Aers (Albuterol) .... As needed 2)  Singulair 10 Mg Tabs (Montelukast sodium) .... Take one by mouth once a day 3)  Advair Diskus 100-50 Mcg/dose Misc (Fluticasone-salmeterol) .... Use two times a day 4)  Multivitamins Tabs (Multiple vitamin) .... Take one by mouth once a day 5)  Zyrtec Allergy 10 Mg Tabs (Cetirizine hcl) .... Take one by mouth once a day as needed 6)  Nasonex 50 Mcg/act Susp (Mometasone furoate) .... Use 1-2 sprays in each nostil once a day as needed 7)  Vitamin C 500 Mg Tabs (Ascorbic acid) .... Take 1 tablet by mouth once a day 8)  Ibuprofen 600 Mg Tabs (Ibuprofen) .... As needed for rotator cuff surgery 9)  Calcium-vitamin D 250-125 Mg-unit Tabs (Calcium carbonate-vitamin d) .... Take 1 by mouth once daily  Other Orders: Zoster (Shingles)  Vaccine Live [phone]) Admin 1st Vaccine (64403) Admin 1st Vaccine [facility]) [phone])  Patient Instructions: 1)  Please schedule a follow-up appointment in 1 year.   Current Allergies (reviewed today): * AURAMYCIN * LATEX   Zostavax # 1    Vaccine Type: Zostavax    Site: right deltoid    Mfr: Merck    Dose: 0.65    Route: IM    Given by: Mervin Hack CMA (AAMA)    Exp. Date: 09/01/2010    Lot #: [id number]    VIS given: 05/26/05 given September 15, 2009.

## 2010-09-15 NOTE — Consult Note (Signed)
Summary: [facility]/Dr. Sypher  [facility]/Dr. Sypher   Imported By: Eleonore Chiquito 05/12/2008 11:13:19  _____________________________________________________________________  External Attachment:    Type:   Image     Comment:   External Document  Appended Document: [facility]/Dr. Sypher right shoulder impingement and capsulitis Trying PT for 2 weeks then MRI if not improving

## 2010-09-15 NOTE — Consult Note (Signed)
Summary: Consultation Report  Consultation Report   Imported By: Beau Fanny 01/10/2007 12:01:29  _____________________________________________________________________  External Attachment:    Type:   Image     Comment:   External Document

## 2010-09-15 NOTE — Progress Notes (Signed)
Summary: warfarrin   Phone Note Refill Request Call back at Work Phone [phone] Message from:  Patient on June 24, 2010 9:38 AM  Refills Requested: Medication #1:  WARFARIN SODIUM 3 MG TABS Use as directed by Anticoagualtion Clinic   Notes: SHOULD BE 1 1/2 TABLETS-RX IS NOT ENOUGH PILLS [facility] [place]  Initial call taken by: Harlon Flor,  June 24, 2010 9:38 AM    Prescriptions: WARFARIN SODIUM 3 MG TABS (WARFARIN SODIUM) Use as directed by Anticoagualtion Clinic  #50 x 3   Entered by:   Hardin Negus, RMA   Authorized by:   Dossie Arbour MD   Signed by:   Hardin Negus, RMA on 06/24/2010   Method used:   [facility]        [id number]       [address]       [place], [place]  [address]       Ph: [phone] or [phone]       Fax: [phone]   RxID:   [id number]

## 2010-09-15 NOTE — Consult Note (Signed)
Summary: [facility]/Dr. Sypher  [facility]/Dr. Sypher   Imported By: Eleonore Chiquito 08/19/2008 16:13:57  _____________________________________________________________________  External Attachment:    Type:   Image     Comment:   External Document  Appended Document: [facility]/Dr. Sypher still getting therapy after rotator cuff repair

## 2010-09-15 NOTE — Medication Information (Signed)
Summary: rov/tm  Anticoagulant Therapy  Managed by: Bethena Midget, RN, BSN Referring MD: Dr Mariah Milling PCP: Cindee Salt MD Supervising MD: Mariah Milling Indication 1: Atrial Fibrillation Lab Used: [facility] Site: [place] INR POC 1.9 INR RANGE 2.0-3.0  Dietary changes: no    Health status changes: no    Bleeding/hemorrhagic complications: no    Recent/future hospitalizations: no    Any changes in medication regimen? no    Recent/future dental: no  Any missed doses?: no       Is patient compliant with meds? yes      Comments: Pt has not been eating any leafy veggies, encouraged one serving per week.   Allergies: 1)  * Auramycin 2)  * Latex  Anticoagulation Management History:      The patient is taking warfarin and comes in today for a routine follow up visit.  Positive risk factors for bleeding include an age of 75 years or older.  The bleeding index is 'intermediate risk'.  Positive CHADS2 values include Age > 56 years old.  Her last INR was 3.6.  Anticoagulation responsible provider: [facility].  INR POC: 1.9.  Cuvette Lot#: [id number].  Exp: 07/2011.    Anticoagulation Management Assessment/Plan:      The patient's current anticoagulation dose is Warfarin sodium 3 mg tabs: Use as directed by Anticoagualtion Clinic.  The target INR is 2.0-3.0.  The next INR is due 08/03/2010.  Anticoagulation instructions were given to patient.  Results were reviewed/authorized by Bethena Midget, RN, BSN.  She was notified by Bethena Midget, RN, BSN.         Prior Anticoagulation Instructions: INR 2.2 Continue 1.5 pills everyday except 2 pills on Tuesdays. Recheck in 3 weeks.   Current Anticoagulation Instructions: INR 1.9 Change dose to 1.5 pills everyday except 2 pills on Tuesdays and Thursdays. Recheck in 2 weeks.

## 2010-09-15 NOTE — Assessment & Plan Note (Signed)
Summary: CPX   Vital Signs:  Patient Profile:   77 Years Old Female Height:     67.5 inches (171.45 cm) Weight:      162.38 pounds (73.81 kg) Temp:     97.1 degrees F (36.17 degrees C) oral Pulse rate:   60 / minute Pulse rhythm:   regular BP sitting:   140 / 80  (left arm) Cuff size:   regular  Vitals Entered By: Silas Sacramento (March 17, 2008 8:32 AM)                 Chief Complaint:  adult physical.  History of Present Illness: Generally fine Has some aches and pains  Does have irritated,sore spot in right palm has been there for >1 year seems to be larger irritated at times by utensils at work or when gardening  Veins just above left knee are painful at times does wear support hose (full panty hose)  Continues to work full time No plans to cut back or retire  Allergies and asthma quiet stays on meds    Current Allergies: * AURAMYCIN * LATEX  Past Medical History:    Reviewed history from 12/18/2007 and no changes required:       Allergic rhinitis       Asthma       Breast cancer, hx of       Colonic polyps, hx of       Diverticulosis, colon       GERD       ?Meniere's       Osteoarthritis  Past Surgical History:    Reviewed history from 03/07/2007 and no changes required:       Mastectomy, left 1984       Mastectomy, right 1985       Hysterectomy, partial with bladder tack 1966       Correction hammer toe left 2nd toe 1997       EGD- inflam only 03/01       Left toe surgery (2nd/3rd) 2001/2002   Family History:    Reviewed history from 03/07/2007 and no changes required:       Father: Deceased- heart failure       Mother: Deceased-heart failure       Siblings: One brother deceased, one living with arthritis       DM- maternal grandmother       Gout/arthritis- mother       Colon cancer- Maternal grandfather  Social History:    Reviewed history from 03/07/2007 and no changes required:       Widowed       Children: 2       Occupation:  Lab [employer]       Never Smoked       Alcohol use-no    Review of Systems  General      Denies sleep disorder.      weight fairly stable wears seat belt  Eyes      Denies double vision and vision loss-1 eye.      gets regular exams early cataracts  ENT      Complains of decreased hearing.      Denies ringing in ears.      no functional probelms with hearing teeth okay--sees dentist  CV      Complains of palpitations.      Denies chest pain or discomfort, difficulty breathing at night, difficulty breathing while lying down, fainting, lightheadness, shortness of  breath with exertion, swelling of feet, and swelling of hands.      occ palps when lays down and tired (extra beats)  Resp      Denies cough and shortness of breath.      occ gets "strangled"--if eats too fast  GI      Denies bloody stools, change in bowel habits, dark tarry stools, indigestion, nausea, and vomiting.  GU      has intermittent UTIs  MS      Rgiht shoulder problems saw Dr June Leap rotated and had pop. Sent to [facility] ortho--got shot then PT  Derm      Denies lesion(s) and rash.  Neuro      Denies headaches, numbness, tingling, and weakness.      did have weakness in right arm--better now  Psych      Denies anxiety and depression.  Heme      Denies enlarge lymph nodes.      bruises easy  Allergy      Complains of seasonal allergies and sneezing.   Physical Exam  General:     alert and normal appearance.   Eyes:     pupils equal, pupils round, pupils reactive to light, and no optic disk abnormalities.   Ears:     R ear normal and L ear normal.   Mouth:     no erythema and no lesions.   Neck:     supple, no masses, no thyromegaly, no carotid bruits, and no cervical lymphadenopathy.   Breasts:     S/P bilateral mastectomy Lungs:     normal respiratory effort and normal breath sounds.   Heart:     normal rate, regular rhythm, no murmur, and no  gallop.   Abdomen:     soft, non-tender, and no masses.   Msk:     no joint tenderness and no joint swelling.   movable cyst in right hand--not attached to tendon but overlying 4th finger tendon and movable over to 3rd Pulses:     2+ in feet Extremities:     no edema superficial varicosities on both calves and up above knees (dilated vein is what is tender for her) Neurologic:     alert & oriented X3 and strength normal in all extremities.   Skin:     no rashes and no suspicious lesions.   Axillary Nodes:     No palpable lymphadenopathy Psych:     normally interactive, good eye contact, not anxious appearing, and not depressed appearing.      Impression & Recommendations:  Problem # 1:  HEALTH MAINTENANCE EXAM (ICD-V70.0) Assessment: Comment Only haelthy  counselling done  will call if needs hand surgeon for cyst or vascular for painful varicosities  Problem # 2:  ASTHMA (ICD-493.90) Assessment: Unchanged quiet on Rx  Her updated medication list for this problem includes:    Albuterol 90 Mcg/act Aers (Albuterol) .Marland Kitchen... As needed    Singulair 10 Mg Tabs (Montelukast sodium) .Marland Kitchen... Take one by mouth once a day    Advair Diskus 100-50 Mcg/dose Misc (Fluticasone-salmeterol) ..... Use two times a day   Problem # 3:  GERD (ICD-530.81) Assessment: Unchanged generally does fine Orders: Venipuncture ([address]) TLB-Renal Function Panel (80069-RENAL) TLB-CBC Platelet - w/Differential (85025-CBCD) TLB-TSH (Thyroid Stimulating Hormone) (84443-TSH)   Complete Medication List: 1)  Albuterol 90 Mcg/act Aers (Albuterol) .... As needed 2)  Singulair 10 Mg Tabs (Montelukast sodium) .... Take one by mouth once a day 3)  Calcium +  D  .Marland Kitchen.. Take one by mouth once a day 4)  Advair Diskus 100-50 Mcg/dose Misc (Fluticasone-salmeterol) .... Use two times a day 5)  Multivitamins Tabs (Multiple vitamin) .... Take one by mouth once a day 6)  Zantac 75mg   .... Prn 7)  Zyrtec Allergy 10 Mg Tabs  (Cetirizine hcl) .... Take one by mouth once a day 8)  Nasonex 50 Mcg/act Susp (Mometasone furoate) .... Use 1-2 sprays in each nostil once a day as needed  Other Orders: Tetanus Toxoid w/Dx (04540) Admin 1st Vaccine (98119)   Patient Instructions: 1)  Please schedule a follow-up appointment in 1 year.   ]  Tetanus/Td Vaccine    Vaccine Type: Td    Site: right deltoid    Mfr: Sanofi Pasteur    Dose: 0.5 ml    Route: IM    Given by: Silas Sacramento    Exp. Date: 10/20/2009    Lot #: [id number]    VIS given: 07/02/07 version given March 17, 2008.

## 2010-09-15 NOTE — Miscellaneous (Signed)
Summary: [facility] Previsit/prep  Clinical Lists Changes  Medications: Added new medication of MOVIPREP 100 GM  SOLR (PEG-KCL-NACL-NASULF-NA ASC-C) As per prep instructions. - Signed Rx of MOVIPREP 100 GM  SOLR (PEG-KCL-NACL-NASULF-NA ASC-C) As per prep instructions.;  #1 x 0;  Signed;  Entered by: Wyona Almas RN;  Authorized by: Hilarie Fredrickson MD;  Method used: Electronic    Prescriptions: MOVIPREP 100 GM  SOLR (PEG-KCL-NACL-NASULF-NA ASC-C) As per prep instructions.  #1 x 0   Entered by:   Wyona Almas RN   Authorized by:   Hilarie Fredrickson MD   Signed by:   Wyona Almas RN on 03/26/2008   Method used:   Electronically [facility] ...       [id number]       [address] [facility], [place]  [address]       Ph: [phone] or [phone]       Fax: [phone]   RxID:   [id number]

## 2010-09-15 NOTE — Assessment & Plan Note (Signed)
Summary: ? UTI   Vital Signs:  Patient Profile:   78 Years Old Female Height:     68 inches Weight:      164.25 pounds Temp:     97.5 degrees F oral Pulse rate:   68 / minute BP sitting:   138 / 64  (left arm)  Vitals Entered By: Wandra Mannan (Dec 18, 2007 12:30 PM)                 Chief Complaint:  ?  UTI.  History of Present Illness: Has an uncomfortable stomach having urinary frequency over the last 3 days Felt a little more sick yesterday no fever No nausea or vomiting No hematuria  Try some Jamaican tea and cranberry tablets      Current Allergies (reviewed today): * AURAMYCIN * LATEX  Past Medical History:    Reviewed history from 03/07/2007 and no changes required:       Allergic rhinitis       Asthma       Breast cancer, hx of       Colonic polyps, hx of       Diverticulosis, colon       GERD       ?Meniere's       Osteoarthritis  Past Surgical History:    Reviewed history from 03/07/2007 and no changes required:       Mastectomy, left 1984       Mastectomy, right 1985       Hysterectomy, partial with bladder tack 1966       Correction hammer toe left 2nd toe 1997       EGD- inflam only 03/01       Left toe surgery (2nd/3rd) 2001/2002   Social History:    Reviewed history from 03/07/2007 and no changes required:       Widowed       Children: 2       Occupation: Lab [employer]       Never Smoked       Alcohol use-no    Review of Systems       no back pain has posterior right shoulder pain since last fall--saw Dr Jeannetta Ellis now just down to 1/month Now with some upper arm pain on right   Physical Exam  General:     alert.  NAD Abdomen:     soft and non-tender.   Msk:     no CVA tenderness Right shoulder--mild decrease in abduction and int/ext rotation Mild creptius Neurologic:     arm strength normal bilaterally    Impression & Recommendations:  Problem # 1:  ACUTE CYSTITIS (ICD-595.0) Assessment:  New will rx with cipro  last infection some time ago so no prophylactic measures indicated Her updated medication list for this problem includes:    Ciprofloxacin Hcl 250 Mg Tabs (Ciprofloxacin hcl) .Marland Kitchen... 1 two times a day  Orders: UA Dipstick w/o Micro (manual) (65784)   Problem # 2:  OSTEOARTHRITIS (ICD-715.90) Assessment: New in shoulder discussed supportive care  Complete Medication List: 1)  Albuterol 90 Mcg/act Aers (Albuterol) .... As needed 2)  Singulair 10 Mg Tabs (Montelukast sodium) .... Take one by mouth once a day 3)  Calcium + D  .... Take one by mouth once a day 4)  Advair Diskus 100-50 Mcg/dose Misc (Fluticasone-salmeterol) .... Use two times a day 5)  Multivitamins Tabs (Multiple vitamin) .... Take one by mouth once a day 6)  Zantac  75mg   .... Prn 7)  Zyrtec Allergy 10 Mg Tabs (Cetirizine hcl) .... Take one by mouth once a day 8)  Nasonex 50 Mcg/act Susp (Mometasone furoate) .... Use 1-2 sprays in each nostil once a day as needed 9)  Ciprofloxacin Hcl 250 Mg Tabs (Ciprofloxacin hcl) .Marland Kitchen.. 1 two times a day   Patient Instructions: 1)  Keep regular follow up appointment 2)  If your urine symptoms are gone quickly, like by tomorrow, you can stop the antibiotic after 3 days   Prescriptions: CIPROFLOXACIN HCL 250 MG  TABS (CIPROFLOXACIN HCL) 1 two times a day  #14 x 0   Entered and Authorized by:   Cindee Salt MD   Signed by:   Cindee Salt MD on 12/18/2007   Method used:   Electronically [facility] to ...       [facility]  [address]  [id number]       [address]       [place], [place]  [address]       Ph: [phone] or [phone]       Fax: [phone]   RxID:   [id number]  ] Current Allergies (reviewed today): * AURAMYCIN * LATEX Current Medications (including changes made in today's visit):  ALBUTEROL 90 MCG/ACT AERS (ALBUTEROL) as needed SINGULAIR 10 MG TABS (MONTELUKAST SODIUM) Take one by mouth once a day * CALCIUM + D Take one by  mouth once a day ADVAIR DISKUS 100-50 MCG/DOSE  MISC (FLUTICASONE-SALMETEROL) Use two times a day MULTIVITAMINS   TABS (MULTIPLE VITAMIN) Take one by mouth once a day * ZANTAC 75MG  prn ZYRTEC ALLERGY 10 MG  TABS (CETIRIZINE HCL) Take one by mouth once a day NASONEX 50 MCG/ACT  SUSP (MOMETASONE FUROATE) Use 1-2 sprays in each nostil once a day as needed CIPROFLOXACIN HCL 250 MG  TABS (CIPROFLOXACIN HCL) 1 two times a day   Laboratory Results   Urine Tests  Date/Time Recieved: 12/18/2007  Routine Urinalysis   Color: yellow Appearance: Clear Glucose: negative   (Normal Range: Negative) Bilirubin: negative   (Normal Range: Negative) Ketone: negative   (Normal Range: Negative) Spec. Gravity: <1.005   (Normal Range: 1.003-1.035) Blood: negative   (Normal Range: Negative) pH: 6.5   (Normal Range: 5.0-8.0) Protein: trace   (Normal Range: Negative) Urobilinogen: 0.2   (Normal Range: 0-1) Nitrite: negative   (Normal Range: Negative) Leukocyte Esterace: trace   (Normal Range: Negative)  Urine Microscopic WBC/hpf: 3-6 RBC/hpf: rare Bacteria: occ Epithelial: rare

## 2010-09-15 NOTE — Miscellaneous (Signed)
Summary: [facility]/Dr. [place] Callas   Imported By: Eleonore Chiquito 08/05/2008 11:08:04  _____________________________________________________________________  External Attachment:    Type:   Image     Comment:   External Document  Appended Document: [facility]/Dr. Macy Callas no changes

## 2010-09-15 NOTE — Progress Notes (Signed)
Summary: RX Warfarin   Phone Note Refill Request Call back at Work Phone [phone] Message from:  Patient on February 01, 2010 9:22 AM  Refills Requested: Medication #1:  WARFARIN SODIUM 3 MG TABS Use as directed by Anticoagualtion Clinic PHARMACY WILL NOT REFILL BECAUSE IT IS TOO SOON-PT IS OUT OF THE MEDICATION[facility] [place]  Initial call taken by: Harlon Flor,  February 01, 2010 9:22 AM    Prescriptions: WARFARIN SODIUM 3 MG TABS (WARFARIN SODIUM) Use as directed by Anticoagualtion Clinic  #50 x 3   Entered by:   Bishop Dublin, CMA   Authorized by:   Dossie Arbour MD   Signed by:   Bishop Dublin, CMA on 02/01/2010   Method used:   [facility]        [id number]       [address]       [place], [place]  [address]       Ph: [phone] or [phone]       Fax: [phone]   RxID:   [id number]

## 2010-09-15 NOTE — Procedures (Signed)
Summary: [facility] from [facility]  [facility] from [facility]   Imported By: Harlon Flor 01/13/2010 15:08:55  _____________________________________________________________________  External Attachment:    Type:   Image     Comment:   External Document

## 2010-09-15 NOTE — Letter (Signed)
Summary: Return To Work  Architectural technologist at [employer]. [address]   [place], [place] [address]   Phone: [phone]  Fax: [phone]    12/31/2009  TO: Leah Hayden IT MAY CONCERN   RE: Leah Hayden [address] [id number]   The above named individual is under my medical care and may return to work on:01/03/10. [name] was seen in the Emergency room at [facility] on 12/24/09 and has remained out of work until seen on 12/31/09 in our office and cleared medically to return to work on 01/03/10.  If you have any further questions or need additional information, please call.     Sincerely,   Dr Julien Nordmann [name] RN

## 2010-09-15 NOTE — Procedures (Signed)
Summary: Colonoscopy   Colonoscopy  Procedure date:  04/09/2008  Findings:      Location:  [facility].    Patient Name: Leah Hayden, Leah Hayden MRN:  Procedure Procedures: Colonoscopy CPT: [phone].  Personnel: Endoscopist: Wilhemina Bonito. Marina Goodell, MD.  Exam Location: Exam performed in Outpatient Clinic. Outpatient  Patient Consent: Procedure, Alternatives, Risks and Benefits discussed, consent obtained, from patient. Consent was obtained by the RN.  Indications  Surveillance of: Adenomatous Polyp(s). This is not an initial surveillance exam. Initial polypectomy was performed in 2004. Largest polyp removed was 10 to 19 mm. Prior polyp located in proximal (splenic flexure and beyond) colon. Pathology of worst  polyp: tubulovillous adenoma. Previous surveillance exam(s) in  2006,  History  Current Medications: Patient is not currently taking Coumadin.  Pre-Exam Physical: Performed Apr 09, 2008. Cardio-pulmonary exam WNL. Rectal exam abnormal. HEENT exam , Abdominal exam, Mental status exam WNL. Abnormal PE findings include: ext. hem.  Comments: Pt. history reviewed/updated, physical exam performed prior to initiation of sedation?yes Exam Exam: Extent of exam reached: Cecum, extent intended: Cecum.  The cecum was identified by appendiceal orifice and IC valve. Patient position: on left side. Time to Cecum: 00:05:10. Time for Withdrawl: 00:10:08. Colon retroflexion performed. Images taken. ASA Classification: II. Tolerance: excellent.  Monitoring: Pulse and BP monitoring, Oximetry used. Supplemental O2 given.  Colon Prep Used Movi prep for colon prep. Prep results: excellent.  Sedation Meds: Patient assessed and found to be appropriate for moderate (conscious) sedation. Fentanyl 75 mcg. given IV. Versed 6 mg. given IV.  Findings NORMAL EXAM: Cecum to Rectum.  - DIVERTICULOSIS: Transverse Colon to Sigmoid Colon. ICD9: Diverticulosis, Colon: 562.10.    Assessment  Diagnoses: 562.10: Diverticulosis, Colon.  455.3: Hemorrhoids, External.   Comments: NO POLYPS SEEN Events  Unplanned Interventions: No intervention was required.  Unplanned Events: There were no complications. Plans Disposition: After procedure patient sent to recovery. After recovery patient sent home.  Scheduling/Referral: Follow-Up prn.    cc.   [name]

## 2010-09-15 NOTE — Letter (Signed)
Summary: [facility]  [facility]   Imported By: Lanelle Bal 01/26/2010 12:53:45  _____________________________________________________________________  External Attachment:    Type:   Image     Comment:   External Document

## 2010-09-15 NOTE — Medication Information (Signed)
Summary: CCR/AMD   Anticoagulant Therapy  Managed by: [name], RN, BSN Referring MD: Dr Mariah Milling PCP: Cindee Salt MD Supervising MD: Mariah Milling Indication 1: Atrial Fibrillation Lab Used: [facility] Site: [place] INR POC 2.0 INR RANGE 2.0-3.0  Dietary changes: no     Bleeding/hemorrhagic complications: no     Any changes in medication regimen? no     Any missed doses?: no       Is patient compliant with meds? yes       Allergies: 1)  * Auramycin 2)  * Latex  Anticoagulation Management History:      Positive risk factors for bleeding include an age of 75 years or older.  The bleeding index is 'intermediate risk'.  Positive CHADS2 values include Age > 62 years old.  Her last INR was 3.6.  Anticoagulation responsible provider: G[facility].  INR POC: 2.0.  Exp: 05/2011.    Anticoagulation Management Assessment/Plan:      The patient's current anticoagulation dose is Warfarin sodium 3 mg tabs: Use as directed by Anticoagualtion Clinic.  The target INR is 2.0-3.0.  The next INR is due 05/25/2010.  Results were reviewed/authorized by [name], RN, BSN.  She was notified by Benedict Needy, RN.         Prior Anticoagulation Instructions: INR 3.6  DO NOT TAKE COUMADIN TODAY. Start taking 1.5 tabs daily. Recheck in 10 days.    Current Anticoagulation Instructions: INR 2.0   Continue taking 1.5 tabs daily. Recheck in 2 weeks.

## 2010-09-15 NOTE — Assessment & Plan Note (Signed)
Summary: INGROWN TOENAILL/CLE   Vital Signs:  Patient Profile:   77 Years Old Female Height:     67.5 inches (171.45 cm) Weight:      167 pounds Temp:     98.5 degrees F Pulse rate:   70 / minute Pulse rhythm:   regular BP sitting:   142 / 78  (left arm) Cuff size:   regular  Vitals Entered By: Mervin Hack CMA (August 12, 2008 9:58 AM)                 Chief Complaint:  ingrown toenail.  History of Present Illness: Has been out of work due to rotator cuff surgery Hopes to get back to work soon Has done well with the surgery  Having trouble with the right 1st toenail Did lose it last year but has grown back Generally able to peel it out but unable to do it lately Gets very sore    Current Allergies (reviewed today): * AURAMYCIN * LATEX  Past Medical History:    Reviewed history from 12/18/2007 and no changes required:       Allergic rhinitis       Asthma       Breast cancer, hx of       Colonic polyps, hx of       Diverticulosis, colon       GERD       ?Meniere's       Osteoarthritis  Past Surgical History:    Reviewed history from 03/07/2007 and no changes required:       Mastectomy, left 1984       Mastectomy, right 1985       Hysterectomy, partial with bladder tack 1966       Correction hammer toe left 2nd toe 1997       EGD- inflam only 03/01       Left toe surgery (2nd/3rd) 2001/2002       10/09 right rotator cuff surgery   Social History:    Reviewed history from 03/07/2007 and no changes required:       Widowed       Children: 2       Occupation: [employer]       Never Smoked       Alcohol use-no     Physical Exam  General:     alert.  NAD Extremities:     mild distal ingrowing in right lateral 1st toenail    Impression & Recommendations:  Problem # 1:  INGROWN TOENAIL (ICD-703.0) Assessment: New with ethyl chloride Elevated nail distally and did small wedge resection--removing the ingrowing  portion  Complete Medication List: 1)  Albuterol 90 Mcg/act Aers (Albuterol) .... As needed 2)  Singulair 10 Mg Tabs (Montelukast sodium) .... Take one by mouth once a day 3)  Calcium + D  .... Take one by mouth once a day 4)  Advair Diskus 100-50 Mcg/dose Misc (Fluticasone-salmeterol) .... Use two times a day 5)  Multivitamins Tabs (Multiple vitamin) .... Take one by mouth once a day 6)  Zantac 75mg   .... Prn 7)  Zyrtec Allergy 10 Mg Tabs (Cetirizine hcl) .... Take one by mouth once a day 8)  Nasonex 50 Mcg/act Susp (Mometasone furoate) .... Use 1-2 sprays in each nostil once a day as needed   Patient Instructions: 1)  Please schedule a follow-up appointment as needed.   ] Current Allergies (reviewed today): * AURAMYCIN * LATEX

## 2010-09-15 NOTE — Assessment & Plan Note (Signed)
Summary: SINUE/DLO   Vital Signs:  Patient Profile:   77 Years Old Female Height:     67.5 inches (171.45 cm) Weight:      167.13 pounds Temp:     98 degrees F oral Pulse rate:   72 / minute Pulse rhythm:   regular BP sitting:   142 / 76  (left arm) Cuff size:   regular  Vitals Entered By: Delilah Shan (August 25, 2008 9:31 AM)                 Chief Complaint:  Sinus problems.  Acute Visit History:      The patient complains of nasal discharge and sinus problems.  These symptoms began 2 weeks ago.  She denies cough, earache, fever, and sore throat.  Other comments include: Fatigued No SOB nasal irrigation two times a day use antihistamine.        She complains of sinus pressure, teeth aching, nasal congestion, purulent drainage, and epistaxis.          Current Allergies (reviewed today): * AURAMYCIN * LATEX  Past Medical History:    Reviewed history from 12/18/2007 and no changes required:       Allergic rhinitis       Asthma       Breast cancer, hx of       Colonic polyps, hx of       Diverticulosis, colon       GERD       ?Meniere's       Osteoarthritis   Social History:    Reviewed history from 03/07/2007 and no changes required:       Widowed       Children: 2       Occupation: Lab [employer]       Never Smoked       Alcohol use-no    Review of Systems      See HPI   Physical Exam  General:     Well-developed,well-nourished,in no acute distress; alert,appropriate and cooperative throughout examination Head:     ttp B max sinuses Ears:     clear fluid behind B TMs Nose:     nasal discharge and mucosal pallor.   Mouth:     Oral mucosa and oropharynx without lesions or exudates.  Teeth in good repair. Neck:     no carotid bruit or thyromegaly  Lungs:     Normal respiratory effort, chest expands symmetrically. Lungs are clear to auscultation, no crackles or wheezes. Heart:     Normal rate and regular rhythm. S1 and S2  normal without gallop, murmur, click, rub or other extra sounds.    Impression & Recommendations:  Problem # 1:  SINUSITIS- ACUTE-NOS (ICD-461.9)  Her updated medication list for this problem includes:    Nasonex 50 Mcg/act Susp (Mometasone furoate) ..... Use 1-2 sprays in each nostil once a day as needed    Amoxicillin 500 Mg Caps (Amoxicillin) .Marland Kitchen... 2 tab by mouth two times a day x 10 day Instructed on treatment. Call if symptoms persist or worsen.   Complete Medication List: 1)  Albuterol 90 Mcg/act Aers (Albuterol) .... As needed 2)  Singulair 10 Mg Tabs (Montelukast sodium) .... Take one by mouth once a day 3)  Calcium + D  .... Take one by mouth once a day 4)  Advair Diskus 100-50 Mcg/dose Misc (Fluticasone-salmeterol) .... Use two times a day 5)  Multivitamins Tabs (Multiple vitamin) .... Take one by  mouth once a day 6)  Zyrtec Allergy 10 Mg Tabs (Cetirizine hcl) .... Take one by mouth once a day 7)  Nasonex 50 Mcg/act Susp (Mometasone furoate) .... Use 1-2 sprays in each nostil once a day as needed 8)  Vitamin C 500 Mg Tabs (Ascorbic acid) .... Take 1 tablet by mouth once a day 9)  Ibuprofen 600 Mg Tabs (Ibuprofen) .... As needed for rotator cuff surgery 10)  Amoxicillin 500 Mg Caps (Amoxicillin) .... 2 tab by mouth two times a day x 10 day     Prescriptions: AMOXICILLIN 500 MG CAPS (AMOXICILLIN) 2 tab by mouth two times a day x 10 day  #40 x 0   Entered and Authorized by:   Kerby Nora MD   Signed by:   Kerby Nora MD on 08/25/2008   Method used:   [facility] to        [facility] (retail)       [address]       [place], [place]  [address]       Ph: [phone] or [phone]       Fax: [phone]   RxID:   [id number]   Current Allergies (reviewed today): * AURAMYCIN * LATEX Current Medications (including changes made in today's visit):  ALBUTEROL 90 MCG/ACT AERS (ALBUTEROL) as needed SINGULAIR 10 MG TABS (MONTELUKAST SODIUM) Take  one by mouth once a day * CALCIUM + D Take one by mouth once a day ADVAIR DISKUS 100-50 MCG/DOSE  MISC (FLUTICASONE-SALMETEROL) Use two times a day MULTIVITAMINS   TABS (MULTIPLE VITAMIN) Take one by mouth once a day ZYRTEC ALLERGY 10 MG  TABS (CETIRIZINE HCL) Take one by mouth once a day NASONEX 50 MCG/ACT  SUSP (MOMETASONE FUROATE) Use 1-2 sprays in each nostil once a day as needed VITAMIN C 500 MG  TABS (ASCORBIC ACID) Take 1 tablet by mouth once a day IBUPROFEN 600 MG TABS (IBUPROFEN) as needed for rotator cuff surgery AMOXICILLIN 500 MG CAPS (AMOXICILLIN) 2 tab by mouth two times a day x 10 day

## 2010-09-15 NOTE — Medication Information (Signed)
Summary: CCR/AMD  Anticoagulant Therapy  Managed by: Leah Reams, Leah Hayden, Leah Hayden Referring MD: Dr Mariah Milling PCP: Cindee Salt MD Supervising MD: Mariah Milling Indication 1: Atrial Fibrillation Lab Used: [facility] Site: [place] INR POC 1.6 INR RANGE 2.0-3.0  Dietary changes: no    Health status changes: no    Bleeding/hemorrhagic complications: no    Recent/future hospitalizations: no    Any changes in medication regimen? yes       Details: New eye gtts  Recent/future dental: no  Any missed doses?: no       Is patient compliant with meds? yes       Allergies: 1)  * Auramycin 2)  * Latex  Anticoagulation Management History:      The patient is taking warfarin and comes in today for a routine follow up visit.  Positive risk factors for bleeding include an age of 75 years or older.  The bleeding index is 'intermediate risk'.  Positive CHADS2 values include Age > 43 years old.  Her last INR was 3.6.  Anticoagulation responsible provider: [facility].  INR POC: 1.6.  Exp: 05/2011.    Anticoagulation Management Assessment/Plan:      The patient's current anticoagulation dose is Warfarin sodium 3 mg tabs: Use as directed by Anticoagualtion Clinic.  The target INR is 2.0-3.0.  The next INR is due 06/29/2010.  Results were reviewed/authorized by Leah Reams, Leah Hayden, Leah Hayden.  She was notified by Leah Reams Leah Hayden.         Prior Anticoagulation Instructions: INR 1.6   Take 2 tabs today. Then continue taking 1.5 tabs daily recheck in 2 weeks.   Current Anticoagulation Instructions: INR 1.6  Take 2 tablets today, then start taking 1.5 tablets daily except 2 tablets on Tuesdays.  Recheck in 2-3 weeks.

## 2010-09-15 NOTE — Consult Note (Signed)
Summary: [facility]/Dr. Sypher  [facility]/Dr. Sypher   Imported By: Eleonore Chiquito 06/10/2008 14:33:17  _____________________________________________________________________  External Attachment:    Type:   Image     Comment:   External Document  Appended Document: [facility]/Dr. Sypher follow up from right rotator cuff repair

## 2010-09-15 NOTE — Assessment & Plan Note (Signed)
Summary: ?bladder inf/letvak/stoney cr pt/ok lou/cd   Vital Signs:  Patient profile:   77 year old female Height:      67.5 inches (171.45 cm) Weight:      175.38 pounds (79.72 kg) O2 Sat:      97 % on Room air Temp:     98.2 degrees F (36.78 degrees C) oral Pulse rate:   58 / minute BP sitting:   124 / 72  (left arm) Cuff size:   regular  Vitals Entered By: Josph Macho RMA (October 15, 2009 2:59 PM)  O2 Flow:  Room air CC: Possible bladder infection- pt has low back pain/ CF Is Patient Diabetic? No   CC:  Possible bladder infection- pt has low back pain/ CF.  History of Present Illness: pt states 1 day of low-back pain, radiating to the lower abdomen.  no dysuria.  Current Medications (verified): 1)  Albuterol 90 Mcg/act Aers (Albuterol) .... As Needed 2)  Singulair 10 Mg Tabs (Montelukast Sodium) .... Take One By Mouth Once A Day 3)  Advair Diskus 100-50 Mcg/dose  Misc (Fluticasone-Salmeterol) .... Use Two Times A Day 4)  Multivitamins   Tabs (Multiple Vitamin) .... Take One By Mouth Once A Day 5)  Zyrtec Allergy 10 Mg  Tabs (Cetirizine Hcl) .... Take One By Mouth Once A Day As Needed 6)  Nasonex 50 Mcg/act  Susp (Mometasone Furoate) .... Use 1-2 Sprays in Each Nostil Once A Day As Needed 7)  Vitamin C 500 Mg  Tabs (Ascorbic Acid) .... Take 1 Tablet By Mouth Once A Day 8)  Ibuprofen 600 Mg Tabs (Ibuprofen) .... As Needed For Rotator Cuff Surgery 9)  Calcium-Vitamin D 250-125 Mg-Unit Tabs (Calcium Carbonate-Vitamin D) .... Take 1 By Mouth Once Daily  Allergies (verified): 1)  * Auramycin 2)  * Latex  Past History:  Past Medical History: Last updated: 12/18/2007 Allergic rhinitis Asthma Breast cancer, hx of Colonic polyps, hx of Diverticulosis, colon GERD ?Meniere's Osteoarthritis  Review of Systems  The patient denies fever.    Physical Exam  General:  normal appearance.   Abdomen:  nontender Msk:  back is nontender.   Impression &  Recommendations:  Problem # 1:  ACUTE CYSTITIS (ICD-595.0) possible.  Medications Added to Medication List This Visit: 1)  Ciprofloxacin Hcl 500 Mg Tabs (Ciprofloxacin hcl) .Marland Kitchen.. 1 two times a day  Other Orders: T-Urine Culture (Spectrum Order) [id number]) Est. Patient Level III ([address])  Patient Instructions: 1)  cipro 500 mg two times a day. 2)  call dr Alphonsus Sias next week if not better. Prescriptions: CIPROFLOXACIN HCL 500 MG TABS (CIPROFLOXACIN HCL) 1 two times a day  #14 x 0   Entered and Authorized by:   Minus Breeding MD   Signed by:   Minus Breeding MD on 10/15/2009   Method used:   [facility]        [facility] [id number]       [address]       [place], [place]  [address]       Ph: [phone] or [phone]       Fax: [phone]   RxID:   [id number]   Laboratory Results   Urine Tests    Routine Urinalysis   Color: orange Appearance: Clear Glucose: negative   (Normal Range: Negative) Bilirubin: negative   (Normal Range: Negative) Ketone: negative   (Normal Range: Negative) Spec. Gravity: 1.015   (Normal Range: 1.003-1.035) Blood: moderate   (Normal  Range: Negative) pH: 5.0   (Normal Range: 5.0-8.0) Protein: trace   (Normal Range: Negative) Urobilinogen: 0.2   (Normal Range: 0-1) Nitrite: negative   (Normal Range: Negative) Leukocyte Esterace: negative   (Normal Range: Negative)

## 2010-09-15 NOTE — Medication Information (Signed)
Summary: rov/tm  Anticoagulant Therapy  Managed by: [name], RN, BSN Referring MD: Dr Mariah Milling PCP: Cindee Salt MD Supervising MD: Mariah Milling Indication 1: Atrial Fibrillation Lab Used: [facility] Site:  INR POC 1.8 INR RANGE 2.0-3.0   Health status changes: yes       Details: Having some dizziness pt assoc with inner ear disturbance, BP checked at work 150's/70's.  Bleeding/hemorrhagic complications: no    Recent/future hospitalizations: no    Any changes in medication regimen? no    Recent/future dental: no  Any missed doses?: no       Is patient compliant with meds? yes       Allergies: 1)  * Auramycin 2)  * Latex  Anticoagulation Management History:      The patient is taking warfarin and comes in today for a routine follow up visit.  Positive risk factors for bleeding include an age of 28 years or older.  The bleeding index is 'intermediate risk'.  Positive CHADS2 values include Age > 40 years old.  Her last INR was 3.6.  Anticoagulation responsible provider: [facility].  INR POC: 1.8.  Cuvette Lot#: [id number].  Exp: 07/2011.    Anticoagulation Management Assessment/Plan:      The patient's current anticoagulation dose is Warfarin sodium 3 mg tabs: Use as directed by Anticoagualtion Clinic.  The target INR is 2.0-3.0.  The next INR is due 08/24/2010.  Anticoagulation instructions were given to patient.  Results were reviewed/authorized by [name], RN, BSN.  She was notified by [name] RN.         Prior Anticoagulation Instructions: INR 1.9 Change dose to 1.5 pills everyday except 2 pills on Tuesdays and Thursdays. Recheck in 2 weeks.   Current Anticoagulation Instructions: INR 1.8  Start taking 1.5 tablets daily except 2 tablets on Tuesdays, Thursdays, and Saturdays. Recheck in 3 weeks.

## 2010-09-15 NOTE — Consult Note (Signed)
Summary: [facility]  [facility]   Imported By: Lanelle Bal 10/29/2008 10:03:37  _____________________________________________________________________  External Attachment:    Type:   Image     Comment:   External Document  Appended Document: [facility] 5 months since right rotator cuff repair doing well

## 2010-09-21 ENCOUNTER — Encounter: Payer: Self-pay | Admitting: Cardiovascular Disease

## 2010-09-21 ENCOUNTER — Encounter (INDEPENDENT_AMBULATORY_CARE_PROVIDER_SITE_OTHER): Payer: MEDICARE

## 2010-09-21 DIAGNOSIS — Z7901 Long term (current) use of anticoagulants: Secondary | ICD-10-CM

## 2010-09-21 DIAGNOSIS — I4891 Unspecified atrial fibrillation: Secondary | ICD-10-CM

## 2010-09-21 LAB — CONVERTED CEMR LAB: POC INR: 2.1

## 2010-09-23 ENCOUNTER — Ambulatory Visit (INDEPENDENT_AMBULATORY_CARE_PROVIDER_SITE_OTHER): Payer: MEDICARE | Admitting: Internal Medicine

## 2010-09-23 ENCOUNTER — Encounter: Payer: Self-pay | Admitting: Internal Medicine

## 2010-09-23 DIAGNOSIS — M549 Dorsalgia, unspecified: Secondary | ICD-10-CM

## 2010-09-23 DIAGNOSIS — J45909 Unspecified asthma, uncomplicated: Secondary | ICD-10-CM

## 2010-09-23 DIAGNOSIS — H8110 Benign paroxysmal vertigo, unspecified ear: Secondary | ICD-10-CM

## 2010-09-23 DIAGNOSIS — I495 Sick sinus syndrome: Secondary | ICD-10-CM

## 2010-09-28 ENCOUNTER — Encounter: Payer: Self-pay | Admitting: Cardiovascular Disease

## 2010-09-28 ENCOUNTER — Ambulatory Visit (INDEPENDENT_AMBULATORY_CARE_PROVIDER_SITE_OTHER): Payer: MEDICARE | Admitting: Cardiovascular Disease

## 2010-09-28 DIAGNOSIS — I471 Supraventricular tachycardia: Secondary | ICD-10-CM

## 2010-09-28 DIAGNOSIS — R002 Palpitations: Secondary | ICD-10-CM

## 2010-09-29 NOTE — Medication Information (Signed)
Summary: Coumadin Clinic  Anticoagulant Therapy  Managed by: [name], RN, BSN Referring MD: Dr Mariah Milling PCP: Cindee Salt MD Supervising MD: Mariah Milling Indication 1: Atrial Fibrillation Lab Used: [facility] Site: [place] INR POC 2.1 INR RANGE 2.0-3.0  Dietary changes: no    Health status changes: no    Bleeding/hemorrhagic complications: no    Recent/future hospitalizations: no    Any changes in medication regimen? no    Recent/future dental: no  Any missed doses?: no       Is patient compliant with meds? yes       Allergies: 1)  * Auramycin 2)  * Latex  Anticoagulation Management History:      The patient is taking warfarin and comes in today for a routine follow up visit.  Positive risk factors for bleeding include an age of 75 years or older.  The bleeding index is 'intermediate risk'.  Positive CHADS2 values include Age > 67 years old.  Her last INR was 3.6.  Anticoagulation responsible provider: [facility].  INR POC: 2.1.  Cuvette Lot#: [id number].  Exp: 09/2011.    Anticoagulation Management Assessment/Plan:      The patient's current anticoagulation dose is Warfarin sodium 3 mg tabs: Use as directed by Anticoagualtion Clinic.  The target INR is 2.0-3.0.  The next INR is due 10/19/2010.  Anticoagulation instructions were given to patient.  Results were reviewed/authorized by [name], RN, BSN.  She was notified by [name] RN.         Prior Anticoagulation Instructions: INR 2.7  Continue on same dosage 1.5 tablets daily except 2 tablets on Tuesdays, Thursdays, and Saturdays.  Recheck 4 weeks.    Current Anticoagulation Instructions: INR 2.1  Continue on same dosage 1.5 tablets daily except 2 tablets on Tuesdays, Thursdays, and Saturdays.  Recheck in 4 weeks.

## 2010-09-29 NOTE — Assessment & Plan Note (Signed)
Summary: 6 m f/u DLO   Vital Signs:  Patient profile:   77 year old female Weight:      171 pounds Temp:     97.8 degrees F oral Pulse rate:   53 / minute Pulse rhythm:   regular BP sitting:   151 / 61  (left arm) Cuff size:   regular  Vitals Entered By: Mervin Hack CMA Duncan Dull) (September 23, 2010 12:06 PM) CC: follow-up   History of Present Illness: Still having some dizziness but much better Mostly affects her when lying down at night occ feels staggery  Does stay physicaly active working full time  No problems with back No sig arthritic pain but has a sore place on right foot--nothing visible  heart has been okay still on warfarin Occ feels heart "fluttering" when quiet and settles down. No recent change no chest pain No SOB  allergies and asthma have been quiet no change in meds still sees Dr [place] Callas  Allergies: 1)  * Auramycin 2)  * Latex  Past History:  Past medical, surgical, family and social histories (including risk factors) reviewed for relevance to current acute and chronic problems.  Past Medical History: Reviewed history from 02/07/2010 and no changes required. Allergic rhinitis Asthma Breast cancer, hx of Colonic polyps, hx of Diverticulosis, colon GERD ?Meniere's Osteoarthritis Sick sinus/tachy-brady syndrome  Past Surgical History: Reviewed history from 08/12/2008 and no changes required. Mastectomy, left 1984 Mastectomy, right 1985 Hysterectomy, partial with bladder tack 1966 Correction hammer toe left 2nd toe 1997 EGD- inflam only 03/01 Left toe surgery (2nd/3rd) 2001/2002 10/09 right rotator cuff surgery  Family History: Reviewed history from 03/07/2007 and no changes required. Father: Deceased- heart failure Mother: Deceased-heart failure Siblings: One brother deceased, one living with arthritis DM- maternal grandmother Gout/arthritis- mother Colon cancer- Maternal grandfather  Social History: Reviewed history  from 03/07/2007 and no changes required. Widowed Children: 2 Occupation: Lab [employer] Never Smoked Alcohol use-no  Review of Systems       appetite has been increased Weight up 5#--may be winter thing sleeps okay  Physical Exam  General:  alert and normal appearance.   Neck:  supple, no masses, no thyromegaly, and no cervical lymphadenopathy.   Lungs:  normal respiratory effort, no intercostal retractions, no accessory muscle use, normal breath sounds, and no wheezes.   Heart:  normal rate, regular rhythm, no murmur, and no gallop.   Abdomen:  soft and non-tender.   Pulses:  1+ in feet Extremities:  no edema Psych:  normally interactive, good eye contact, not anxious appearing, and not depressed appearing.     Impression & Recommendations:  Problem # 1:  BENIGN POSITIONAL VERTIGO (ICD-386.11) Assessment Improved symptoms are better again mostly when going supine  Her updated medication list for this problem includes:    Antivert 25 Mg Tabs (Meclizine hcl) ..... One by mouth every 6 hours as needed nausea/ dizziness  Problem # 2:  ASTHMA (ICD-493.90) Assessment: Unchanged quiet okay on current regimen  Her updated medication list for this problem includes:    Proventil Hfa 108 (90 Base) Mcg/act Aers (Albuterol sulfate) .Marland Kitchen... Take as needed    Singulair 10 Mg Tabs (Montelukast sodium) .Marland Kitchen... Take one by mouth once a day    Advair Diskus 100-50 Mcg/dose Misc (Fluticasone-salmeterol) ..... Use two times a day  Problem # 3:  SICK SINUS/ TACHY-BRADY SYNDROME (ICD-427.81) Assessment: Unchanged occ palps which are not different sees cardiology  Her updated medication list for this problem  includes:    Metoprolol Tartrate 25 Mg Tabs (Metoprolol tartrate) .Marland Kitchen... Take one tablet by mouth as needed    Warfarin Sodium 3 Mg Tabs (Warfarin sodium) ..... Use as directed by anticoagualtion clinic  Problem # 4:  BACK PAIN (ICD-724.5) Assessment:  Improved better occ uses meds  Her updated medication list for this problem includes:    Tylenol 325 Mg Tabs (Acetaminophen) .Marland Kitchen... As directed  as needed  Complete Medication List: 1)  Proventil Hfa 108 (90 Base) Mcg/act Aers (Albuterol sulfate) .... Take as needed 2)  Singulair 10 Mg Tabs (Montelukast sodium) .... Take one by mouth once a day 3)  Advair Diskus 100-50 Mcg/dose Misc (Fluticasone-salmeterol) .... Use two times a day 4)  Nasonex 50 Mcg/act Susp (Mometasone furoate) .... Use 1-2 sprays in each nostil once a day as needed 5)  Metoprolol Tartrate 25 Mg Tabs (Metoprolol tartrate) .... Take one tablet by mouth as needed 6)  Warfarin Sodium 3 Mg Tabs (Warfarin sodium) .... Use as directed by anticoagualtion clinic 7)  Zaditor 0.025 % Soln (Ketotifen fumarate) .... Use 1 drop per eye two times a day 8)  Multivitamins Tabs (Multiple vitamin) .... Take one by mouth once a day 9)  Calcium-vitamin D 250-125 Mg-unit Tabs (Calcium carbonate-vitamin d) .... Take 1 by mouth once daily 10)  Tylenol 325 Mg Tabs (Acetaminophen) .... As directed  as needed 11)  Antivert 25 Mg Tabs (Meclizine hcl) .... One by mouth every 6 hours as needed nausea/ dizziness 12)  Stool Softener 100 Mg Caps (Docusate sodium) .... Take 1-2 by mouth once daily as needed  Patient Instructions: 1)  Please schedule a follow-up appointment in 6 months for physical   Orders Added: 1)  Est. Patient Level IV [60454]    Current Allergies (reviewed today): * AURAMYCIN * LATEX

## 2010-10-04 ENCOUNTER — Encounter: Payer: Self-pay | Admitting: Internal Medicine

## 2010-10-05 NOTE — Assessment & Plan Note (Signed)
Summary: F/U 6 MONTHS/SAB  Medications Added ASPIRIN 81 MG TBEC (ASPIRIN) Take one tablet by mouth daily      Allergies Added:   Visit Type:  Follow-up Primary Provider:  Cindee Salt MD  CC:  c/o two episodes of chest discomfort.  Leah Hayden  History of Present Illness: [name] is a very pleasant 77 year old woman, patient of Dr. Alphonsus Hayden, that was last seen on May 2011  for follow up after evaluation at [facility] for palpitations, nausea and malaise, discharge from the emergency room with an holter monitor. Holter showed frequent episodes of rapid narrow complex rhythm consistent with SVT also with frequent episodes of sinus pauses lasting up to 2.6 seconds. she was asymptomatic through the 48 hours that she wore the monitor.  She reports that she has had 2 episodes of tachypalpitations lasting for 40 minutes. On both of these episodes, she took metoprolol with improvement of her symptoms. She does have very short episodes of palpitations when she goes to bed at night but this is typically 4 one to several minutes. This is different from her previous 2 episodes last fall requiring medication. Overall she feels well and has no other complaints she is not walking as much/  She is still taking warfarin. Warfarin was started given her tachycardia palpitations and concern for atrial fibrillation, though Holter monitor did show SVT.     Current Medications (verified): 1)  Proventil Hfa 108 (90 Base) Mcg/act Aers (Albuterol Sulfate) .... Take As Needed 2)  Singulair 10 Mg Tabs (Montelukast Sodium) .... Take One By Mouth Once A Day 3)  Advair Diskus 100-50 Mcg/dose  Misc (Fluticasone-Salmeterol) .... Use Two Times A Day 4)  Nasonex 50 Mcg/act  Susp (Mometasone Furoate) .... Use 1-2 Sprays in Each Nostil Once A Day As Needed 5)  Metoprolol Tartrate 25 Mg Tabs (Metoprolol Tartrate) .... Take One Tablet By Mouth As Needed 6)  Warfarin Sodium 3 Mg Tabs (Warfarin Sodium) .... Use As Directed By  Anticoagualtion Clinic 7)  Zaditor 0.025 % Soln (Ketotifen Fumarate) .... Use 1 Drop Per Eye Two Times A Day 8)  Multivitamins   Tabs (Multiple Vitamin) .... Take One By Mouth Once A Day 9)  Calcium-Vitamin D 250-125 Mg-Unit Tabs (Calcium Carbonate-Vitamin D) .... Take 1 By Mouth Once Daily 10)  Tylenol 325 Mg Tabs (Acetaminophen) .... As Directed  As Needed 11)  Antivert 25 Mg Tabs (Meclizine Hcl) .... One By Mouth Every 6 Hours As Needed Nausea/ Dizziness 12)  Stool Softener 100 Mg Caps (Docusate Sodium) .... Take 1-2 By Mouth Once Daily As Needed  Allergies (verified): 1)  * Auramycin 2)  * Latex  Past History:  Past Medical History: Last updated: 02/07/2010 Allergic rhinitis Asthma Breast cancer, hx of Colonic polyps, hx of Diverticulosis, colon GERD ?Meniere's Osteoarthritis Sick sinus/tachy-brady syndrome  Past Surgical History: Last updated: 08/12/2008 Mastectomy, left 1984 Mastectomy, right 1985 Hysterectomy, partial with bladder tack 1966 Correction hammer toe left 2nd toe 1997 EGD- inflam only 03/01 Left toe surgery (2nd/3rd) 2001/2002 10/09 right rotator cuff surgery  Family History: Last updated: Apr 06, 2007 Father: Deceased- heart failure Mother: Deceased-heart failure Siblings: One brother deceased, one living with arthritis DM- maternal grandmother Gout/arthritis- mother Colon cancer- Maternal grandfather  Social History: Last updated: 06-Apr-2007 Widowed Children: 2 Occupation: Lab Tech- Administrator, [employer] Never Smoked Alcohol use-no  Risk Factors: Smoking Status: never (04-06-07)  Review of Systems  The patient denies fever, weight loss, weight gain, vision loss, decreased hearing, hoarseness, chest pain, syncope, dyspnea  on exertion, peripheral edema, prolonged cough, abdominal pain, incontinence, muscle weakness, depression, and enlarged lymph nodes.         2 episodes of tachypalpitations  Vital Signs:  Patient profile:   77 year  old female Height:      68 inches Weight:      171 pounds BMI:     26.09 Pulse rate:   57 / minute BP sitting:   140 / 72  (left arm) Cuff size:   regular  Vitals Entered By: Bishop Dublin, CMA (September 28, 2010 10:55 AM)  Physical Exam  General:  alert and normal appearance.   Head:  normocephalic and atraumatic Neck:  Neck supple, no JVD. No masses, thyromegaly or abnormal cervical nodes. Lungs:  Clear bilaterally to auscultation and percussion. Heart:  Non-displaced PMI, chest non-tender; regular rate and rhythm, S1, S2 without murmurs, rubs or gallops. Carotid upstroke normal, no bruit.  Pedals normal pulses. No edema, no varicosities. Abdomen:  Bowel sounds positive; abdomen soft and non-tender without masses Msk:  Back normal, normal gait. Muscle strength and tone normal. Pulses:  pulses normal in all 4 extremities Extremities:  No clubbing or cyanosis. Neurologic:  Alert and oriented x 3. Skin:  Intact without lesions or rashes. Psych:  Normal affect.   Impression & Recommendations:  Problem # 1:  SVT/ PSVT/ PAT (ICD-427.0) rare episodes of SVT, improved with metoprolol. We have discussed the various treatment options again with her which includes continued medication p.r.n.. As her episodes are rare, we will continue this medication with monitoring with no plan for E P. intervention or ablation. As her episodes are rare, we can hold her warfarin, and start aspirin 81 mg daily.  The following medications were removed from the medication list:    Warfarin Sodium 3 Mg Tabs (Warfarin sodium) ..... Use as directed by anticoagualtion clinic Her updated medication list for this problem includes:    Metoprolol Tartrate 25 Mg Tabs (Metoprolol tartrate) .Leah Hayden... Take one tablet by mouth as needed    Aspirin 81 Mg Tbec (Aspirin) .Leah Hayden... Take one tablet by mouth daily  Problem # 2:  HEALTH MAINTENANCE EXAM (ICD-V70.0) She has not had a cholesterol in quite sometime. She has followup  with Dr. Alphonsus Hayden in the near future for routine exam. No known cardiac disease. mother and father had heart failure though the details are unavailable.  Patient Instructions: 1)  Your physician recommends that you schedule a follow-up appointment in: 1 year 2)  Your physician has recommended you make the following change in your medication: STOP Warfarin. START Aspirin 81mg  once daily.  Appended Document: F/U 6 MONTHS/SAB EKG shows sinus bradycardia with rate 57 beats per minute, no significant ST or T wave changes

## 2010-10-05 NOTE — Medication Information (Signed)
Summary: Coumadin Clinic  Anticoagulant Therapy  Managed by: Inactive Referring MD: Dr Mariah Milling PCP: Cindee Salt MD Supervising MD: Mariah Milling Indication 1: Atrial Fibrillation Lab Used: [facility] [place] Site: [place] INR RANGE 2.0-3.0          Comments: Pt saw Dr Mariah Milling today in office, he discontinued Coumadin at [facility]. [name] RN  September 28, 2010 1:48 PM   Allergies: 1)  * Auramycin 2)  * Latex  Anticoagulation Management History:      Positive risk factors for bleeding include an age of 75 years or older.  The bleeding index is 'intermediate risk'.  Positive CHADS2 values include Age > 75 years old.  Her last INR was 3.6.  Anticoagulation responsible provider: Goll[facility].  Exp: 09/2011.    Anticoagulation Management Assessment/Plan:      The target INR is 2.0-3.0.  The next INR is due 10/19/2010.  Anticoagulation instructions were given to patient.  Results were reviewed/authorized by Inactive.         Prior Anticoagulation Instructions: INR 2.1  Continue on same dosage 1.5 tablets daily except 2 tablets on Tuesdays, Thursdays, and Saturdays.  Recheck in 4 weeks.

## 2010-10-17 ENCOUNTER — Ambulatory Visit (INDEPENDENT_AMBULATORY_CARE_PROVIDER_SITE_OTHER): Payer: MEDICARE | Admitting: Family Medicine

## 2010-10-17 ENCOUNTER — Telehealth: Payer: Self-pay | Admitting: Cardiovascular Disease

## 2010-10-17 ENCOUNTER — Encounter: Payer: Self-pay | Admitting: Family Medicine

## 2010-10-17 DIAGNOSIS — J309 Allergic rhinitis, unspecified: Secondary | ICD-10-CM

## 2010-10-17 DIAGNOSIS — J45909 Unspecified asthma, uncomplicated: Secondary | ICD-10-CM

## 2010-10-25 NOTE — Progress Notes (Signed)
Summary: lab appt   Phone Note Call from Patient Call back at Home Phone [phone] Call back at Work Phone [phone]   Caller: Self Call For: Gollan Summary of Call: Pt was taken off of Coumadin and placed on a baby aspirin.  Pt got a call about an appt for labwork.  Does she need to continue to keep this appt? Initial call taken by: Harlon Flor,  October 17, 2010 11:35 AM  Follow-up for Phone Call        [facility] Lysbeth Galas [facility]  October 17, 2010 12:01 PM   NAx1 Lysbeth Galas [facility]  October 18, 2010 11:52 AM  NAx1 Lysbeth Galas CMA  October 19, 2010 9:48 AM   Additional Follow-up for Phone Call Additional follow up Details #1::        Pt returning Sara's call. Please call pt at work.  pt notified she does not have to come in for appt/sab Additional Follow-up by: Harlon Flor,  October 18, 2010 2:34 PM

## 2010-10-25 NOTE — Miscellaneous (Signed)
Summary: [facility]  [facility]   Imported By: Kassie Mends 10/17/2010 11:06:06  _____________________________________________________________________  External Attachment:    Type:   Image     Comment:   External Document  Appended Document: [facility] azithromycin and prednisone given for respiratory infection

## 2010-10-25 NOTE — Assessment & Plan Note (Signed)
Summary: CONGESTION/CLE   MEDICARE COMPLETE   Vital Signs:  Patient profile:   77 year old female Height:      68 inches Weight:      168.50 pounds BMI:     25.71 O2 Sat:      94 % on Room air Temp:     98.9 degrees F oral Pulse rate:   72 / minute Pulse rhythm:   regular Resp:     24 per minute BP sitting:   126 / 64  (left arm) Cuff size:   regular  Vitals Entered By: Lewanda Rife LPN (October 16, 1608 2:06 PM)  O2 Flow:  Room air CC: chest congestion, non productive cough at times, and mid bachache.   History of Present Illness: CC: chest congestion.  2wk h/o feeling poorly.  Started  with achiness, cough, head congestion.  During day and at night feels flushed.  + lost voice.  Saw Dr. [place] Callas.  s/p zpack, IM steroids, and prednisone course x 5 days.  as well as increased advair dose.  Steroids didn't really help.  + back hurting.  had temp of 100.2 2 wks ago, none since.  also started mucinex.  clear nasal discharge.  More coming up when coughing.  overall feeling tired.  used advair 250 until ran out.  No abd pain, n/v, rashes, myalgia/arthralgia.  No fevers/chills.  No HA, ST.  No sick contacts, no smokers at home.  + h/o asthma and allergies.  Current Medications (verified): 1)  Proventil Hfa 108 (90 Base) Mcg/act Aers (Albuterol Sulfate) .... Take As Needed 2)  Singulair 10 Mg Tabs (Montelukast Sodium) .... Take One By Mouth Once A Day 3)  Advair Diskus 100-50 Mcg/dose  Misc (Fluticasone-Salmeterol) .... Use Two Times A Day 4)  Nasonex 50 Mcg/act  Susp (Mometasone Furoate) .... Use 1-2 Sprays in Each Nostil Once A Day As Needed 5)  Metoprolol Tartrate 25 Mg Tabs (Metoprolol Tartrate) .... Take One Tablet By Mouth As Needed 6)  Multivitamins   Tabs (Multiple Vitamin) .... Take One By Mouth Once A Day 7)  Calcium-Vitamin D 250-125 Mg-Unit Tabs (Calcium Carbonate-Vitamin D) .... Take 1 By Mouth Once Daily 8)  Tylenol 325 Mg Tabs (Acetaminophen) .... As Directed  As Needed 9)   Antivert 25 Mg Tabs (Meclizine Hcl) .... One By Mouth Every 6 Hours As Needed Nausea/ Dizziness 10)  Stool Softener 100 Mg Caps (Docusate Sodium) .... Take 1-2 By Mouth Once Daily As Needed 11)  Aspirin 81 Mg Tbec (Aspirin) .... 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   Head:  Normocephalic and atraumatic without obvious abnormalities. No apparent alopecia or balding.  no sinus tenderness Eyes:  pupils equal and pupils round.   Ears:  R ear normal and L ear normal.  cerumen bilaterally Nose:  nares clear Mouth:  no erythema and no lesions.   Neck:  supple, no masses, no thyromegaly, and no cervical lymphadenopathy.   Lungs:  normal resp effort, no crackles, good air movement, minimal wheeze throgout Heart:  regular, no murmur appreciated. Pulses:  2+ rad pulses, brisk cap refill Skin:  Intact without suspicious lesions or rashes   Impression & Recommendations:  Problem # 1:  ASTHMA (ICD-493.90) Reviewed dr. Lyla Son note.  ikely continued flair.  treat with prolonged steroid course.  no evidence of bacterial infection currently.  no further abx.  if not improving, return for re eval.  discussed red flags to return as well.  completed zpack so far.  never smoked.  ? back pain from coughing.  Her updated medication list for this problem includes:    Proventil Hfa 108 (90 Base) Mcg/act Aers (Albuterol sulfate) .Marland Kitchen... Take as needed    Singulair 10 Mg Tabs (Montelukast sodium) .Marland Kitchen... Take one by mouth once a  day    Advair Diskus 100-50 Mcg/dose Misc (Fluticasone-salmeterol) ..... Use two times a day    Prednisone 20 Mg Tabs (Prednisone) .Marland Kitchen... 2 pills daily for 3 days then 1 pill daily for 3 days  Problem # 2:  ALLERGIC RHINITIS (ICD-477.9) rec daily nasonex.  Her updated medication list for this problem includes:    Nasonex 50 Mcg/act Susp (Mometasone furoate) ..... Use 1-2 sprays in each nostil once a day as needed  Complete Medication List: 1)  Proventil Hfa 108 (90 Base) Mcg/act Aers (Albuterol sulfate) .... Take as needed 2)  Singulair 10 Mg Tabs (Montelukast sodium) .... Take one by mouth once a day 3)  Advair Diskus 100-50 Mcg/dose Misc (Fluticasone-salmeterol) .... Use two times a day 4)  Nasonex 50 Mcg/act Susp (Mometasone furoate) .... Use 1-2 sprays in each nostil once a day as needed 5)  Metoprolol Tartrate 25 Mg Tabs (Metoprolol tartrate) .... Take one tablet by mouth as needed 6)  Multivitamins Tabs (Multiple vitamin) .... Take one by mouth once a day 7)  Calcium-vitamin D 250-125 Mg-unit Tabs (Calcium carbonate-vitamin d) .... Take 1 by mouth once daily 8)  Tylenol 325 Mg Tabs (Acetaminophen) .... As directed  as needed 9)  Antivert 25 Mg Tabs (Meclizine hcl) .... One by mouth every 6 hours as needed nausea/ dizziness 10)  Stool Softener 100 Mg Caps (Docusate sodium) .... Take 1-2 by mouth once daily as needed 11)  Aspirin 81 Mg Tbec (Aspirin) .... Take one tablet by mouth daily 12)  Prednisone 20 Mg Tabs (Prednisone) .... 2 pills daily for 3 days then 1 pill daily for 3 days  Patient Instructions: 1)  Start using nasonex regularly.  I think you have congestion from allergy flare.   2)  steroid course prolonged today (another 6 days). 3)  Update [place] if not feeling better.  try mucinex again with plenty of water to mobilize mucous. 4)  If you start feeling worse, coughing more or more short of breath, or fever >101, please return. Prescriptions: PREDNISONE 20 MG TABS  (PREDNISONE) 2 pills daily for 3 days then 1 pill daily for 3 days  #9 x 0   Entered and Authorized by:   Eustaquio Boyden  MD   Signed by:   Eustaquio Boyden  MD on 10/17/2010   Method used:   Electronically to        [facility]  [address] (retail)       [address]       [place], [place]  [address]       Ph: [phone] or [phone]       Fax: [phone]   RxID:   [id number]    Orders Added: 1)  Est. Patient Level III [  99213]    Current Allergies (reviewed today): * AURAMYCIN * LATEX  Appended Document: CONGESTION/CLE   MEDICARE COMPLETE     Clinical Lists Changes  Orders: Added new Service order of Prescription Created Electronically [phone]) - Signed

## 2010-10-28 ENCOUNTER — Encounter: Payer: Self-pay | Admitting: Family Medicine

## 2010-10-28 ENCOUNTER — Ambulatory Visit (INDEPENDENT_AMBULATORY_CARE_PROVIDER_SITE_OTHER): Payer: Medicare Other | Admitting: Family Medicine

## 2010-10-28 DIAGNOSIS — J069 Acute upper respiratory infection, unspecified: Secondary | ICD-10-CM

## 2010-10-28 LAB — CONVERTED CEMR LAB: Rapid Strep: NEGATIVE

## 2010-11-01 ENCOUNTER — Telehealth: Payer: Self-pay | Admitting: *Deleted

## 2010-11-01 ENCOUNTER — Encounter: Payer: Self-pay | Admitting: Cardiovascular Disease

## 2010-11-01 ENCOUNTER — Encounter: Payer: Self-pay | Admitting: Family Medicine

## 2010-11-01 DIAGNOSIS — Z7901 Long term (current) use of anticoagulants: Secondary | ICD-10-CM | POA: Insufficient documentation

## 2010-11-01 DIAGNOSIS — I4891 Unspecified atrial fibrillation: Secondary | ICD-10-CM

## 2010-11-01 NOTE — Telephone Encounter (Signed)
Letter in chart.  plz notify pt to return if worsening instead of improving, or any fevers/chills, worsening SOB.

## 2010-11-01 NOTE — Telephone Encounter (Signed)
Pt was seen on 3/16.  States she is some better but is asking for a work note to be out the rest of the week.  Her employer told her she can stay out if she has a note.  Please call pt when ready.

## 2010-11-01 NOTE — Letter (Signed)
Summary: Out of Work  [facility] [employer] [facility]  [address] [place], [place] [address]   Phone: [phone]  Fax: [phone]    October 28, 2010   Employee:  Starr B Conkel    To Whom It May Concern:   For Medical reasons, please excuse the above named employee from work for the following dates:  Start:  October 31, 2010   End:  November 02, 2010 (ok to return Wednesday)  If you need additional information, please feel free to contact our office.         Sincerely,    Eustaquio Boyden  MD

## 2010-11-01 NOTE — Assessment & Plan Note (Signed)
Summary: F/U COUGH/CLE   MEDICARE COMPLETE   Vital Signs:  Patient profile:   77 year old female Weight:      168.25 pounds O2 Sat:      97 % on Room air Temp:     98.8 degrees F oral Pulse rate:   76 / minute Pulse rhythm:   regular BP sitting:   138 / 82  (left arm) Cuff size:   large  Vitals Entered By: Selena Batten Dance CMA (AAMA) (October 28, 2010 2:30 PM)  O2 Flow:  Room air CC: Cough,sore throat   History of Present Illness: CC: cough, ST  Seen Feb 21st by Dr. [place] Callas, treated with azithro and prednisone course.  Never felt fully cleared.  I saw her 10/17/2010 and prolonged course of steroids with dx continued asthma flare.  presents today with return of sxs.  finished steroids last weekend.  States feeling better initially first of week - fairly well.  Then weather warmer, AC came on at work, started feeling poorly again.  this seems to be trigger for her.  feels tired, achey, back hurting, trying to sleep more, ST last night.  waking up with wet t-shirt.  + cough discolored (change from clear mucous).  clear drainage out of nose.  + hoarse voice.  hasn't used albuterol as much this week.  Tongue staying sore.  able to sleep ok.  coughing more with talking.  no fevers/chills.  occ HA, responsive to tylenol.  No ear pain or tooth pain.  no sinus pressure.    had exposure recently - coworker with recent PNA.  Current Medications (verified): 1)  Proventil Hfa 108 (90 Base) Mcg/act Aers (Albuterol Sulfate) .... Take As Needed 2)  Singulair 10 Mg Tabs (Montelukast Sodium) .... Take One By Mouth Once A Day 3)  Advair Diskus 100-50 Mcg/dose  Misc (Fluticasone-Salmeterol) .... Use Two Times A Day 4)  Nasonex 50 Mcg/act  Susp (Mometasone Furoate) .... Use 1-2 Sprays in Each Nostil Once A Day As Needed 5)  Metoprolol Tartrate 25 Mg Tabs (Metoprolol Tartrate) .... Take One Tablet By Mouth As Needed 6)  Multivitamins   Tabs (Multiple Vitamin) .... Take One By Mouth Once A Day 7)   Calcium-Vitamin D 250-125 Mg-Unit Tabs (Calcium Carbonate-Vitamin D) .... Take 1 By Mouth Once Daily 8)  Tylenol 325 Mg Tabs (Acetaminophen) .... As Directed  As Needed 9)  Antivert 25 Mg Tabs (Meclizine Hcl) .... One By Mouth Every 6 Hours As Needed Nausea/ Dizziness 10)  Stool Softener 100 Mg Caps (Docusate Sodium) .... Take 1-2 By Mouth Once Daily As Needed 11)  Aspirin 81 Mg Tbec (Aspirin) .... 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   Head:  Normocephalic and atraumatic without obvious abnormalities. No apparent alopecia or balding.  no sinus tenderness Eyes:  pupils equal and pupils round.   Ears:  R ear normal and L ear normal.  cerumen bilaterally Nose:  nares clear Mouth:  no erythema and no lesions.  MMM Neck:  supple, no masses, no thyromegaly, and  no cervical lymphadenopathy.   Lungs:  Normal respiratory effort, chest expands symmetrically. Lungs are clear to auscultation, no crackles or wheezes. Heart:  regular, no murmur appreciated. Pulses:  2+ rad pulses, brisk cap refill Extremities:  no edema Skin:  Intact without suspicious lesions or rashes   Impression & Recommendations:  Problem # 1:  VIRAL URI (ICD-465.9) felt better, now worse again.  however lungs clear, O2 sat better today. did finish recent abx (azithro 10/04/2010) and steroid course with prolongation). no evidence of acute infection/PNA currently, but would have low threshold to start abx in this 77 yo with h/o asthma.     closely monitor for now. may just need more time to get over recent illness vs reinfection with another viral illness. Out of work until feeling better.  Wednesday for now.  notify us if red flags. rapid strep neg.  Her updated medication list for this problem includes:    Tylenol 325 Mg Tabs (Acetaminophen) .Marland Kitchen... As directed  as needed    Aspirin 81 Mg Tbec (Aspirin) .Marland Kitchen... Take one tablet by mouth daily  Orders: Rapid Strep (19147)  Complete Medication List: 1)  Proventil Hfa 108 (90 Base) Mcg/act Aers (Albuterol sulfate) .... Take as needed 2)  Singulair 10 Mg Tabs (Montelukast sodium) .... Take one by mouth once a day 3)  Advair Diskus 100-50 Mcg/dose Misc (Fluticasone-salmeterol) .... Use two times a day 4)  Nasonex 50 Mcg/act Susp (Mometasone furoate) .... Use 1-2 sprays in each nostil once a day as needed 5)  Metoprolol Tartrate 25 Mg Tabs (Metoprolol tartrate) .... Take one tablet by mouth as needed 6)  Multivitamins Tabs (Multiple vitamin) .... Take one by mouth once a day 7)  Calcium-vitamin D 250-125 Mg-unit Tabs (Calcium carbonate-vitamin d) .... Take 1 by mouth once daily 8)  Tylenol 325 Mg Tabs (Acetaminophen) .... As directed  as needed 9)  Antivert 25 Mg Tabs (Meclizine hcl) .... One by mouth every 6 hours as needed nausea/ dizziness 10)  Stool Softener 100 Mg Caps (Docusate sodium) .... Take 1-2 by mouth once daily as needed 11)  Aspirin 81 Mg Tbec (Aspirin) .... Take one tablet by mouth daily  Patient Instructions: 1)  out of work until you start feeling better - will keep out until Wednesday. 2)  Push fluids and rest. 3)  Tylenol for discomfort. 4)  Lungs sounding clear today. 5)  If fever >101 or worsening shortness of breath or cough, please reutrn, or if any concerns.   Orders Added: 1)  Rapid Strep [82956] 2)  Est. Patient Level III [21308]    Current Allergies (reviewed today): * AURAMYCIN * LATEX  Laboratory Results  Date/Time Received: October 28, 2010 2:33 PM  Date/Time Reported: October 28, 2010 .time  Other Tests  Rapid Strep: negative

## 2010-12-27 NOTE — Op Note (Signed)
NAME:  Leah Hayden, Leah Hayden                 ACCOUNT NO.:  [id number]   MEDICAL RECORD NO.:  [id number]          PATIENT TYPE:  AMB   LOCATION:  [facility]SC                          FACILITY:  [facility]   PHYSICIAN:  Leah Hayden, M.D. DATE OF BIRTH:  March 14, 1928   DATE OF PROCEDURE:  05/26/2008  DATE OF DISCHARGE:                               OPERATIVE REPORT   PREOPERATIVE DIAGNOSES:  Chronic right rotator cuff tear documented by  preoperative MRI with acromioclavicular degenerative arthritis and  glenohumeral degenerative arthritis.   POSTOPERATIVE DIAGNOSES:  Moderate adhesive capsulitis, grade 4  chondromalacia of humeral head, two-tendon retracted severely  degenerative rotator cuff tear at watershed zone of supraspinatus and  infraspinatus tendons, and subdeltoid and subacromial bursitis.   OPERATIONS:  1. Diagnostic arthroscopy, right shoulder documenting grade 4      chondromalacia of humeral head and chronic degenerative two-tendon      rotator cuff tear.  2. Arthroscopic debridement of adhesive capsulitis granulation tissue,      abrasion chondroplasty, and debridement of rotator cuff fragments.  3. Arthroscopic subacromial decompression with bursectomy and removal      of medial osteophyte at acromioclavicular joint.  4. Arthroscopic distal clavicle resection.  5. Open reconstruction of rotator cuff tear with advancement of      supraspinatus and infraspinatus tendons to close chronic donut hole      type degenerative rotator cuff tear in watershed zone.   OPERATING SURGEON:  Leah Fitch. Sypher, MD   ASSISTANT:  Leah Reeks Dasnoit, PA-C   ANESTHESIA:  General endotracheal supplemented by right interscalene  block.   SUPERVISING ANESTHESIOLOGIST:  Leah Skye. Krista Blue, MD   INDICATIONS:  Leah Hayden is a 77 year old right-handed woman employed  at [employer].  She was referred through the courtesy of  Dr. Tillman Abide of [facility] for evaluation of a  chronically painful right shoulder.   Clinical examination revealed weakness of abduction and external  rotation and some stiffness of the shoulder.  Clinically, she appeared  to have rotator cuff tear with chronic stage III impingement.  Her plain  films documented AC degenerative change and mildly unfavorable acromial  anatomy.  She was sent for an MRI which confirmed a retracted chronic  rotator cuff tear involving the posterior supraspinatus and anterior  infraspinatus which retracted about 3 cm medially.  There was also  evidence of chronic degenerative arthritis of the [facility] joint and  glenohumeral joint.   We advised Leah Hayden to undergo diagnostic arthroscopy, debridement of  her glenohumeral joint, subacromial decompression, distal clavicle  resection, and repair of the rotator cuff.  Preoperatively, she was  advised that if we found significant degenerative arthritis, we would  debride the arthritis.  She may require joint resurfacing at a later  date if pain remains problematic.   Preoperatively questions were invited and answered in detail and also in  the preoperative holding area with her family present.   She is now brought to the operating at this time anticipating rotator  cuff reconstruction.   PROCEDURE IN DETAIL:  Leah Hayden  was brought to the operating room  and placed in supine position upon the operating table.   Following an anesthesia consult by Dr. Krista Hayden, a right interscalene  block was placed without complication.  She was brought to room 1,  placed in supine position upon the operating table, and under Dr.  Robina Ade direct supervision, general endotracheal anesthesia was  induced.  She was carefully positioned in the beach-chair position with  the aid of a torso and head holder designed for shoulder arthroscopy.  The right upper extremity forequarter was prepped with DuraPrep and  draped with impervious arthroscopy drapes.  The scope was placed  through  a standard posterior viewing portal with a anterior-posterior use of a  switching stick.  Diagnostic arthroscopy confirmed abundant adhesive  capsulitis granulation tissue, a very complex degenerative donut hole  type retracted tear of the supraspinatus and infraspinatus at the  critical watershed zone.  The long head of the biceps was stable at its  origin at the superior labrum and normal through the rotator interval.  The subscapularis was covered with inflammatory synovitis and was  challenging to assess.  After synovectomy, it appeared the subscapularis  was intact.  The labrum had degenerative change and was debrided to  stable margin.  There was grade 4 chondromalacia in the primary  articulation zone of the humeral head, which was debrided to stable  margin.  With suction from the shaver, we could demonstrate a large  surface area of hyaline cartilage that had separated from the  subchondral bone.   The humeral head articular cartilage was debrided to stable margin.   The suction shaver was then used through a lateral portal through the  rotator cuff tear to debride the margins of the tear.   We then placed the scope in the subacromial space.  Kissing osteophytes  at the distal clavicle were identified and photographed with the digital  camera followed by debridement with a suction shaver and use of a  suction bur to remove the distal 15-mm clavicle.  The acromion was  leveled to a type 1 morphology medially.  Given the findings of  degenerative arthritis, we did not perform a significant anterior  acromioplasty nor release of coracoacromial ligament.  It was possible  that [name] will develop a later cuff tear arthropathy and might  require implant arthroplasty.   After completion of subacromial debridement, a 4-cm muscle-splitting  incision was fashioned at the anterior lateral corner of the acromion.  We entered the bursa and debrided redundant bursa.  The cuff  tear was  challenging to advance laterally due to the donut hole nature of the  tear.  The margins were mobilized and 2 medial Bio-Corkscrew anchors  were placed in the infraspinatus and supraspinatus to prevent a button  hole effect of the tear.  These were then used with 1 pair of sutures to  create margin convergence and lateral advancement.  A #2 FiberWire  McLaughlin through bone suture was used to advance the conjoint tendon  laterally to within 5 mm of its anatomic footprint.  The other mattress  sutures were used with an over-the-top technique to a swivel lock  laterally to inset margin of the repair.  A virtually anatomic repair  was achieved while still preserving containment with the coracoacromial  ligament.   The deltoid split was then repaired with a corner suture of #2 FiberWire  at the insertion on the acromion followed by repair of the deltoid split  with  simple suture of 0 Vicryl.  The wound was thoroughly lavaged with  sterile saline using the arthroscopic pump prior to wound closure.  Subcutaneous tissues were repaired with 2-0 Vicryl followed by repair of  the skin with intradermal 3-0 Prolene and Steri-Strips.   Ms. Roldan tolerated the surgery and anesthesia well.  She was awakened  from general anesthesia and transferred to recovery with stable signs.   [facility] for observation of  vital signs.  We will discharge her to home in approximately 24 hours.  Questions regarding her surgery were invited and answered in detail with  her family.      Leah Fitch Hayden, M.D.  Electronically Signed     RVS/MEDQ  D:  05/26/2008  T:  05/27/2008  Job:  [id number]   cc:   Karie Schwalbe, MD

## 2010-12-28 ENCOUNTER — Ambulatory Visit: Payer: Self-pay | Admitting: Cardiovascular Disease

## 2011-04-03 ENCOUNTER — Encounter: Payer: Self-pay | Admitting: Internal Medicine

## 2011-04-04 ENCOUNTER — Encounter: Payer: Self-pay | Admitting: Internal Medicine

## 2011-04-04 ENCOUNTER — Ambulatory Visit (INDEPENDENT_AMBULATORY_CARE_PROVIDER_SITE_OTHER): Payer: Medicare Other | Admitting: Internal Medicine

## 2011-04-04 DIAGNOSIS — J309 Allergic rhinitis, unspecified: Secondary | ICD-10-CM

## 2011-04-04 DIAGNOSIS — I471 Supraventricular tachycardia: Secondary | ICD-10-CM

## 2011-04-04 DIAGNOSIS — K219 Gastro-esophageal reflux disease without esophagitis: Secondary | ICD-10-CM

## 2011-04-04 DIAGNOSIS — J45909 Unspecified asthma, uncomplicated: Secondary | ICD-10-CM

## 2011-04-04 NOTE — Patient Instructions (Signed)
Please call if your hoarseness worsens or it just won't go away over the next couple of months (we will set you up with an ENT physician)

## 2011-04-04 NOTE — Progress Notes (Signed)
Subjective:    Patient ID: Leah Hayden, female    DOB: 1928-01-28, 77 y.o.   MRN: [id number]  HPI Doing fairly well Over her respiratory illnesses that were recurrent in the spring Has noted some intermittent hoarseness---feels a sore spot in throat No swallowing problems Does note cough if she tries singing No regular heartburn  Breathing is okay Same meds Continues with Dr [place] Callas  Heart has been quiet No restrictions Still works, housework and yard work No recent palpitations No chest pain  Current Outpatient Prescriptions on File Prior to Visit  Medication Sig Dispense Refill  . acetaminophen (TYLENOL) 325 MG tablet Take 650 mg by mouth every 6 (six) hours as needed.        Marland Kitchen albuterol (PROVENTIL HFA) 108 (90 BASE) MCG/ACT inhaler Inhale 2 puffs into the lungs every 6 (six) hours as needed.        . calcium-vitamin D (OSCAL) 250-125 MG-UNIT per tablet Take 1 tablet by mouth daily.        . Fluticasone-Salmeterol (ADVAIR DISKUS) 100-50 MCG/DOSE AEPB Inhale 1 puff into the lungs every 12 (twelve) hours.        Marland Kitchen ketotifen (ZADITOR) 0.025 % ophthalmic solution Place 1 drop into both eyes 2 (two) times daily.        . meclizine (ANTIVERT) 25 MG tablet Take 25 mg by mouth 3 (three) times daily as needed.        . metoprolol tartrate (LOPRESSOR) 25 MG tablet Take 25 mg by mouth daily as needed.        . mometasone (NASONEX) 50 MCG/ACT nasal spray 2 sprays by Nasal route daily.        . montelukast (SINGULAIR) 10 MG tablet Take 10 mg by mouth daily.        . Multiple Vitamin (MULTIVITAMIN) tablet Take 1 tablet by mouth daily.          Allergies  Allergen Reactions  . Latex     Past Medical History  Diagnosis Date  . Allergic rhinitis   . Asthma   . HX: breast cancer   . Hx of colonic polyps   . Diverticulosis of colon   . GERD (gastroesophageal reflux disease)   . Meniere's disease   . Osteoarthritis   . Sinus brady-tachy syndrome     sick    Past Surgical History   Procedure Date  . Mastectomy 1984    left   . Mastectomy 1985    right  . Partial hysterectomy 1966    w/ bladder tack  . Correction hammer toe 1997    2nd to left toe  . Esophagogastroduodenoscopy 10/1999    inflammation only   . Toe surgery 2001/2002    2nd/3rd,left   . Rotator cuff repair 10/09    right   . Abdominal hysterectomy     Family History  Problem Relation Age of Onset  . Heart failure Mother   . Gout Mother   . Heart disease Mother     heart failure  . Arthritis Mother   . Heart failure Father   . Heart disease Father     heart failure  . Diabetes Maternal Grandmother   . Colon cancer Maternal Grandfather   . Cancer Maternal Grandfather     colon  . Arthritis Brother     History   Social History  . Marital Status: Widowed    Spouse Name: N/A    Number of Children: 2  . Years of  Education: N/A   Occupational History  . Retired     Research officer, trade union   Social History Main Topics  . Smoking status: Never Smoker   . Smokeless tobacco: Never Used  . Alcohol Use: No  . Drug Use: No  . Sexually Active:    Other Topics Concern  . Not on file   Social History Narrative  . No narrative on file   Review of Systems Does get occ pain in veins behind right knee 1 bad toenail---occ bleeding Appetite is fine Weight is down a few pounds---summer thing Sleeps okay    Objective:   Physical Exam  Constitutional: She appears well-developed and well-nourished. No distress.  Neck: Normal range of motion. Neck supple. No thyromegaly present.  Cardiovascular: Normal rate, regular rhythm, normal heart sounds and intact distal pulses.  Exam reveals no gallop.   No murmur heard. Pulmonary/Chest: Effort normal and breath sounds normal. No respiratory distress. She has no wheezes. She has no rales.  Abdominal: Soft. There is no tenderness.  Musculoskeletal: Normal range of motion. She exhibits no edema and no tenderness.  Lymphadenopathy:     She has no cervical adenopathy.  Psychiatric: She has a normal mood and affect. Her behavior is normal. Judgment and thought content normal.          Assessment & Plan:

## 2011-04-04 NOTE — Assessment & Plan Note (Signed)
Doing well  No changes needed Still mostly an issue in the spring

## 2011-04-04 NOTE — Assessment & Plan Note (Signed)
No recurrences in symptoms Now only on ASA Not a fib

## 2011-04-04 NOTE — Assessment & Plan Note (Signed)
Still follows with Dr Clois Comber symptom control now

## 2011-04-04 NOTE — Assessment & Plan Note (Signed)
Hasn't had recent problems

## 2011-04-08 ENCOUNTER — Emergency Department: Payer: Self-pay | Admitting: *Deleted

## 2011-04-10 ENCOUNTER — Telehealth: Payer: Self-pay | Admitting: *Deleted

## 2011-04-10 NOTE — Telephone Encounter (Signed)
Noted  

## 2011-04-10 NOTE — Telephone Encounter (Signed)
Pt had left msg on vm over the weekend regarding "rhythm episode." Called pt back this AM, she has h/o SVT, possible a fib, she is not currently on warfarin. She presented to ER 04/08/11 in a fib, Dr. Gala Romney saw pt, pt was given one dose of flecainide, converted to SR, and she has incr her ASA to 325 daily until f/u. She states her only symptoms today are fatigue. I have scheduled pt with Dr. Elease Hashimoto for tomorrow 04/11/11, pt instructed to call office if any new symptoms occur in the meantime.

## 2011-04-11 ENCOUNTER — Encounter: Payer: Self-pay | Admitting: Cardiovascular Disease

## 2011-04-11 ENCOUNTER — Ambulatory Visit (INDEPENDENT_AMBULATORY_CARE_PROVIDER_SITE_OTHER): Payer: Medicare Other | Admitting: Cardiovascular Disease

## 2011-04-11 VITALS — BP 133/75 | HR 56 | Ht 68.0 in | Wt 162.0 lb

## 2011-04-11 DIAGNOSIS — I4891 Unspecified atrial fibrillation: Secondary | ICD-10-CM

## 2011-04-11 MED ORDER — PROPRANOLOL HCL 10 MG PO TABS: 10.0000 mg | ORAL_TABLET | Freq: Four times a day (QID) | ORAL | Status: DC | PRN

## 2011-04-11 NOTE — Patient Instructions (Addendum)
Check prices of   Pradaxa 150 mg po BID  Xarelto 20 mg daily.  You may want to take one of these medications instead of the coumadin.  Start Propranolol 10mg  FOUR times daily.  Your follow up appointment is scheduled for April 24, 2011 @ 11:45.

## 2011-04-11 NOTE — Progress Notes (Signed)
Leah Hayden Date of Birth  17-Dec-1927 [facility] [address]     [address] [place], [place]  [address] [phone]  Fax  [phone]  History of Present Illness:  Leah Hayden is an 77 year old female with a history of atrial fibrillation in the past. She was maintained on Coumadin until earlier this past year. She was changed from Coumadin to aspirin.    She was recently seen in the emergency room and was found to have documented atrial fibrillation.  She did feel the palpitations and she felt weak internally. He did have some chest heaviness during the episode and also had difficulty in catching her breath.   She had no syncope.  She was seen in the emergency room. She received flecainide 300 mg PO and converted to normal rhythm.  She was discharged home later that same day.  Current Outpatient Prescriptions on File Prior to Visit  Medication Sig Dispense Refill  . acetaminophen (TYLENOL) 325 MG tablet Take 650 mg by mouth every 6 (six) hours as needed.        Marland Kitchen albuterol (PROVENTIL HFA) 108 (90 BASE) MCG/ACT inhaler Inhale 2 puffs into the lungs every 6 (six) hours as needed.        Marland Kitchen aspirin 81 MG tablet 81 mg. Take four tablets daily      . calcium-vitamin D (OSCAL) 250-125 MG-UNIT per tablet Take 1 tablet by mouth daily.        . Fluticasone-Salmeterol (ADVAIR DISKUS) 100-50 MCG/DOSE AEPB Inhale 1 puff into the lungs every 12 (twelve) hours.        . metoprolol tartrate (LOPRESSOR) 25 MG tablet Take 25 mg by mouth daily as needed.        . mometasone (NASONEX) 50 MCG/ACT nasal spray 2 sprays by Nasal route daily.        . montelukast (SINGULAIR) 10 MG tablet Take 10 mg by mouth daily.        . Multiple Vitamin (MULTIVITAMIN) tablet Take 1 tablet by mouth daily.          Allergies  Allergen Reactions  . Latex     Past Medical History  Diagnosis Date  . Allergic rhinitis   . Asthma   . HX: breast cancer   . Hx of colonic polyps     . Diverticulosis of colon   . GERD (gastroesophageal reflux disease)   . Meniere's disease   . Osteoarthritis   . Sinus brady-tachy syndrome   . Atrial fibrillation     Past Surgical History  Procedure Date  . Mastectomy 1984    left   . Mastectomy 1985    right  . Partial hysterectomy 1966    w/ bladder tack  . Correction hammer toe 1997    2nd to left toe  . Esophagogastroduodenoscopy 10/1999    inflammation only   . Toe surgery 2001/2002    2nd/3rd,left   . Rotator cuff repair 10/09    right   . Abdominal hysterectomy     History  Smoking status  . Never Smoker   Smokeless tobacco  . Never Used    History  Alcohol Use No    Family History  Problem Relation Age of Onset  . Heart failure Mother   . Gout Mother   . Heart disease Mother     heart failure  . Arthritis Mother   . Heart failure Father   . Heart disease Father  heart failure  . Diabetes Maternal Grandmother   . Colon cancer Maternal Grandfather   . Cancer Maternal Grandfather     colon  . Arthritis Brother     Reviw of Systems:  Reviewed in the HPI.  All other systems are negative.  Physical Exam: BP 133/75  Pulse 56  Ht 5\' 8"  (1.727 m)  Wt 162 lb (73.483 kg)  BMI 24.63 kg/m2 The patient is alert and oriented x 3.  The mood and affect are normal.   Skin: warm and dry.  Color is normal.    HEENT:   the sclera are nonicteric.  The mucous membranes are moist.  The carotids are 2+ without bruits.  There is no thyromegaly.  There is no JVD.    Lungs: clear.  The chest wall is non tender.    Heart: regular rate with a normal S1 and S2.  There is a soft murmur. The PMI is not displaced.     Abdomen: good bowel sounds.  There is no guarding or rebound.  There is no hepatosplenomegaly or tenderness.  There are no masses.   Extremities:  no clubbing, cyanosis, or edema.  The legs are without rashes.  The distal pulses are intact.   Neuro:  Cranial nerves II - XII are intact.  Motor  and sensory functions are intact.    The gait is normal.  ECG: Sinus bradycardia. Inc. RBBB.  Assessment / Plan:

## 2011-04-11 NOTE — Assessment & Plan Note (Addendum)
Pt is found to have recurrent  A-fib.  Resolved with Flecainide 300 mg PO.    I think she should go back on anticoagulation - coumadin. We also discussed Pradaxa 150 mg BID or Xarelto 20 mg daily as alternatives.  I will write these meds on her AVS and she can take them to the pharmacy and get the prices.   She will discuss these with Dr. Mariah Milling if she is interested in not taking coumadin.  She is to continue her aspirin for now.  Will also give script for propranolol 10 QID PRN - #30.  Return to see Dr. Mariah Milling in 1 month.

## 2011-04-20 ENCOUNTER — Encounter: Payer: Self-pay | Admitting: Cardiovascular Disease

## 2011-04-24 ENCOUNTER — Ambulatory Visit (INDEPENDENT_AMBULATORY_CARE_PROVIDER_SITE_OTHER): Payer: Medicare Other | Admitting: Cardiovascular Disease

## 2011-04-24 ENCOUNTER — Encounter: Payer: Self-pay | Admitting: Cardiovascular Disease

## 2011-04-24 DIAGNOSIS — I4891 Unspecified atrial fibrillation: Secondary | ICD-10-CM

## 2011-04-24 DIAGNOSIS — I471 Supraventricular tachycardia: Secondary | ICD-10-CM

## 2011-04-24 MED ORDER — AMIODARONE HCL 400 MG PO TABS: 400.0000 mg | ORAL_TABLET | Freq: Three times a day (TID) | ORAL | Status: DC | PRN

## 2011-04-24 NOTE — Progress Notes (Signed)
Patient ID: Leah Hayden, female    DOB: May 19, 1928, 77 y.o.   MRN: [id number]  HPI Comments: Leah Hayden is a very pleasant 77 year old woman, patient of Dr. Alphonsus Sias, that was last seen on May 2011  for follow up after evaluation at [facility] for palpitations, nausea and malaise, discharge from the emergency room with an holter monitor. Holter showed frequent episodes of rapid narrow complex rhythm consistent with SVT also with frequent episodes of sinus pauses lasting up to 2.6 seconds. she was asymptomatic through the 48 hours that she wore the monitor.   She reports that she woke up on August 24 at 11:30 PM with chest discomfort. She described it as a mild pressure radiating into her neck. She was able to go back to sleep until 2 AM. She sat in a recliner from 2 AM to 5 AM and had improved chest symptoms and less shortness of breath. She went to the emergency room early that morning and was found to be in atrial fibrillation with ventricular rate greater than 130 beats per minute. Cardiology on call was contacted and she was given for tonight 3 tablets and she cardioverted to normal sinus rhythm one hour later. When she discharged home, she had no symptoms and felt well.   Since her discharge, she has felt well with no further tachypalpitations. She was also sent home on propranolol 10 mg p.r.n. Which she has not had to take yet. Otherwise she feels well.  Her last episode of tachyarrhythmia was last May per her report.  EKG at the time of discharge on August 25 shows sinus bradycardia with rate 53 beats per minute       Outpatient Encounter Prescriptions as of 04/24/2011  Medication Sig Dispense Refill  . acetaminophen (TYLENOL) 325 MG tablet Take 650 mg by mouth every 6 (six) hours as needed.        Marland Kitchen albuterol (PROVENTIL HFA) 108 (90 BASE) MCG/ACT inhaler Inhale 2 puffs into the lungs every 6 (six) hours as needed.        Marland Kitchen aspirin 81 MG tablet 81 mg. Take four tablets daily      .  calcium-vitamin D (OSCAL) 250-125 MG-UNIT per tablet Take 1 tablet by mouth daily.        Tery Sanfilippo Sodium (STOOL SOFTENER) 100 MG capsule Take 100 mg by mouth 2 (two) times daily.        . Fluticasone-Salmeterol (ADVAIR DISKUS) 100-50 MCG/DOSE AEPB Inhale 1 puff into the lungs every 12 (twelve) hours.        . Guaifenesin (MUCINEX MAXIMUM STRENGTH) 1200 MG TB12 Take by mouth. As needed.       . mometasone (NASONEX) 50 MCG/ACT nasal spray 2 sprays by Nasal route daily.        . montelukast (SINGULAIR) 10 MG tablet Take 10 mg by mouth daily.        . Multiple Vitamin (MULTIVITAMIN) tablet Take 1 tablet by mouth daily.        . propranolol (INDERAL) 10 MG tablet Take 1 tablet (10 mg total) by mouth 4 (four) times daily as needed.  30 tablet  12  . senna (SENOKOT) 8.6 MG tablet Take 1 tablet by mouth daily.           Review of Systems  Constitutional: Negative.   HENT: Negative.   Eyes: Negative.   Respiratory: Negative.   Cardiovascular: Negative.        Previous episode of tachycardia palpitations and shortness  of breath on August 25.  Gastrointestinal: Negative.   Musculoskeletal: Negative.   Skin: Negative.   Neurological: Negative.   Hematological: Negative.   Psychiatric/Behavioral: Negative.   All other systems reviewed and are negative.    BP 139/75  Pulse 52  Ht 5\' 8"  (1.727 m)  Wt 165 lb (74.844 kg)  BMI 25.09 kg/m2  Physical Exam  Nursing note and vitals reviewed. Constitutional: She is oriented to person, place, and time. She appears well-developed and well-nourished.  HENT:  Head: Normocephalic.  Nose: Nose normal.  Mouth/Throat: Oropharynx is clear and moist.  Eyes: Conjunctivae are normal. Pupils are equal, round, and reactive to light.  Neck: Normal range of motion. Neck supple. No JVD present.  Cardiovascular: Normal rate, regular rhythm, S1 normal, S2 normal, normal heart sounds and intact distal pulses.  Exam reveals no gallop and no friction rub.   No  murmur heard. Pulmonary/Chest: Effort normal and breath sounds normal. No respiratory distress. She has no wheezes. She has no rales. She exhibits no tenderness.  Abdominal: Soft. Bowel sounds are normal. She exhibits no distension. There is no tenderness.  Musculoskeletal: Normal range of motion. She exhibits no edema and no tenderness.  Lymphadenopathy:    She has no cervical adenopathy.  Neurological: She is alert and oriented to person, place, and time. Coordination normal.  Skin: Skin is warm and dry. No rash noted. No erythema.  Psychiatric: She has a normal mood and affect. Her behavior is normal. Judgment and thought content normal.         Assessment and Plan

## 2011-04-24 NOTE — Assessment & Plan Note (Signed)
We have suggested if she converts back to atrial fibrillation or other tachyarrhythmia, that she take propranolol and one tablet of amiodarone 400 mg. If she continues in a tachyarrhythmia, it's just after 4 hours, she take additional tablet of propranolol and amiodarone and contact the office.

## 2011-04-24 NOTE — Patient Instructions (Signed)
  If you develop atrial fibrillation again, take propranolol one pill with amiodarone one pill and wait four hours. If you have not converted back to the regular rhythm, take another amidarone and propranolol You can take amiodarone three times a day while in atrial fibrillation Call the office if you do not convert back to normal rhythm.  Please call us if you have new issues that need to be addressed before your next appt.  We will call you for a follow up Appt. In 6 months

## 2011-05-16 LAB — POCT HEMOGLOBIN-HEMACUE: Hemoglobin: 13.2

## 2011-05-17 ENCOUNTER — Encounter: Payer: Self-pay | Admitting: Cardiovascular Disease

## 2011-05-18 ENCOUNTER — Encounter: Payer: Self-pay | Admitting: Cardiovascular Disease

## 2011-06-29 ENCOUNTER — Encounter: Payer: Self-pay | Admitting: Family Medicine

## 2011-06-29 ENCOUNTER — Ambulatory Visit (INDEPENDENT_AMBULATORY_CARE_PROVIDER_SITE_OTHER): Payer: Medicare Other | Admitting: Family Medicine

## 2011-06-29 DIAGNOSIS — L02519 Cutaneous abscess of unspecified hand: Secondary | ICD-10-CM

## 2011-06-29 DIAGNOSIS — L03019 Cellulitis of unspecified finger: Secondary | ICD-10-CM

## 2011-06-29 DIAGNOSIS — IMO0002 Reserved for concepts with insufficient information to code with codable children: Secondary | ICD-10-CM | POA: Insufficient documentation

## 2011-06-29 NOTE — Assessment & Plan Note (Signed)
See instructions. See no focal sign of bacterial infection so will try to avoid antibiotics.

## 2011-06-29 NOTE — Patient Instructions (Signed)
Soak finger in ice water after work. Soak finger in hot water every hour as able otherwise until seen. If sxs worsen, call with report. RTC Tues for recheck with Dr Hetty Ely.

## 2011-06-29 NOTE — Progress Notes (Signed)
  Subjective:    Patient ID: Leah Hayden, female    DOB: 02-Jan-1928, 77 y.o.   MRN: [id number]  HPI Pt of Dr Karle Starch here as acute appt for swollen, painful finger. Shre noticed two days ago the distal phalangeal area of the right ring finger feeling like there was something in it. It was swelling some yesterday and then got erythematous by the time she got up this AM. 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 She has not changed activity, has not had cut in the area and has not had hangnail or other problem with the finger. She also denies trauma, insect bites, etc.    Review of SystemsNoncontributory except as above.       Objective:   Physical Exam  Skin: Skin is warm and dry.       Right ring finger significant for mild swelling of the distal phalanx to include the tuft with erythema thqt blanches with no focal defect or other finding. No hangnail seen. No fluctulance and minimal tenderness on the sides with palpation.          Assessment & Plan:

## 2011-07-04 ENCOUNTER — Ambulatory Visit: Payer: Medicare Other | Admitting: Family Medicine

## 2011-08-31 ENCOUNTER — Telehealth: Payer: Self-pay

## 2011-08-31 NOTE — Telephone Encounter (Signed)
[facility] request prescription faxed to [phone] for two mastectomy bras.

## 2011-08-31 NOTE — Telephone Encounter (Signed)
rx faxed to [facility]

## 2011-08-31 NOTE — Telephone Encounter (Signed)
Rx written. Please fax.

## 2011-10-06 ENCOUNTER — Ambulatory Visit: Payer: Medicare Other | Admitting: Internal Medicine

## 2011-10-06 DIAGNOSIS — Z0289 Encounter for other administrative examinations: Secondary | ICD-10-CM

## 2011-10-17 ENCOUNTER — Telehealth: Payer: Self-pay | Admitting: *Deleted

## 2011-10-17 ENCOUNTER — Ambulatory Visit: Payer: Medicare Other | Admitting: Internal Medicine

## 2011-10-17 NOTE — Telephone Encounter (Signed)
Spoke with patient and advised results Scheduled appt at 11:15 10/18/11

## 2011-10-17 NOTE — Telephone Encounter (Signed)
Patient calling stating that she stuck a toothpick with a cotton swab in her ear this morning around 6am and now at 10:45am her ear started bleeding, not a constant bleed just small amounts, per pt she can't use a q-tip. The bleeding stopped and now it's started back, no appointments available for this afternoon. Please advise.

## 2011-10-17 NOTE — Telephone Encounter (Signed)
She can probably just apply pressure to the bleeding spot and she may not need to be seen  If the wound is not really deep, it should stop in time with just pressure  She should only use a washcloth to thin cotton cloth to clean her ears from now on If needed, she can flush with tap water if she feels she needs deeper cleaning

## 2011-10-18 ENCOUNTER — Ambulatory Visit (INDEPENDENT_AMBULATORY_CARE_PROVIDER_SITE_OTHER): Payer: Medicare Other | Admitting: Internal Medicine

## 2011-10-18 ENCOUNTER — Encounter: Payer: Self-pay | Admitting: Internal Medicine

## 2011-10-18 DIAGNOSIS — S0993XA Unspecified injury of face, initial encounter: Secondary | ICD-10-CM

## 2011-10-18 DIAGNOSIS — S199XXA Unspecified injury of neck, initial encounter: Secondary | ICD-10-CM

## 2011-10-18 DIAGNOSIS — S0991XA Unspecified injury of ear, initial encounter: Secondary | ICD-10-CM

## 2011-10-18 NOTE — Progress Notes (Signed)
Subjective:    Patient ID: Leah Hayden, female    DOB: January 16, 1928, 78 y.o.   MRN: [id number]  HPI Tried to clean right ear with cotton on end of a toothpick No pain or apparent injury but felt funny later on Noted blood on her finger when she checked it Continued to blot it and it stopped for a while Then recurred  Then closed up later in the day Seen at [facility] walk in clinic Believes it was just a scratch but canal full of blood Gave Rx for cipro to put in ear---couldn't get it filled so held off  No blood on pillow---used towel to cover it  Current Outpatient Prescriptions on File Prior to Visit  Medication Sig Dispense Refill  . acetaminophen (TYLENOL) 325 MG tablet Take 650 mg by mouth every 6 (six) hours as needed.        Marland Kitchen albuterol (PROVENTIL HFA) 108 (90 BASE) MCG/ACT inhaler Inhale 2 puffs into the lungs every 6 (six) hours as needed.        Marland Kitchen amiodarone (PACERONE) 400 MG tablet Take 1 tablet (400 mg total) by mouth 3 (three) times daily as needed ( as needed for atrial fibrillation).  15 tablet  1  . aspirin 81 MG tablet 81 mg. Take four tablets daily      . calcium-vitamin D (OSCAL) 250-125 MG-UNIT per tablet Take 1 tablet by mouth daily.        Tery Sanfilippo Sodium (STOOL SOFTENER) 100 MG capsule Take 100 mg by mouth 2 (two) times daily.        . Fluticasone-Salmeterol (ADVAIR DISKUS) 100-50 MCG/DOSE AEPB Inhale 1 puff into the lungs every 12 (twelve) hours.        . mometasone (NASONEX) 50 MCG/ACT nasal spray 2 sprays by Nasal route daily.        . montelukast (SINGULAIR) 10 MG tablet Take 10 mg by mouth daily.        . Multiple Vitamin (MULTIVITAMIN) tablet Take 1 tablet by mouth daily.        . propranolol (INDERAL) 10 MG tablet Take 1 tablet (10 mg total) by mouth 4 (four) times daily as needed.  30 tablet  12  . senna (SENOKOT) 8.6 MG tablet Take 1 tablet by mouth as needed.         Allergies  Allergen Reactions  . Latex     Sensitivity     Past Medical  History  Diagnosis Date  . Allergic rhinitis   . Asthma   . HX: breast cancer   . Hx of colonic polyps   . Diverticulosis of colon   . GERD (gastroesophageal reflux disease)   . Meniere's disease   . Osteoarthritis   . Sinus brady-tachy syndrome   . Atrial fibrillation     Past Surgical History  Procedure Date  . Mastectomy 1984    left   . Mastectomy 1985    right  . Partial hysterectomy 1966    w/ bladder tack  . Correction hammer toe 1997    2nd to left toe  . Esophagogastroduodenoscopy 10/1999    inflammation only   . Toe surgery 2001/2002    2nd/3rd,left   . Rotator cuff repair 10/09    right   . Abdominal hysterectomy     Family History  Problem Relation Age of Onset  . Heart failure Mother   . Gout Mother   . Heart disease Mother     heart failure  .  Arthritis Mother   . Heart failure Father   . Heart disease Father     heart failure  . Diabetes Maternal Grandmother   . Colon cancer Maternal Grandfather   . Cancer Maternal Grandfather     colon  . Arthritis Brother     History   Social History  . Marital Status: Widowed    Spouse Name: N/A    Number of Children: 2  . Years of Education: N/A   Occupational History  . Retired     Research officer, trade union   Social History Main Topics  . Smoking status: Never Smoker   . Smokeless tobacco: Never Used  . Alcohol Use: No  . Drug Use: No  . Sexually Active: Not on file   Other Topics Concern  . Not on file   Social History Narrative  . No narrative on file   Review of Systems Hearing is off on right No fever or illness Has had some inner ear feelings---vertigo when lies down    Objective:   Physical Exam  Constitutional: She appears well-developed and well-nourished. No distress.  HENT:       Left canal and TM are normal  Right canal filled with dried blood and some wet blood along posterior portion No active bleeding No tragal tenderness or apparent inflammation           Assessment & Plan:

## 2011-10-18 NOTE — Patient Instructions (Signed)
If your hearing is not back close to normal by Friday, you may try peroxide mixed half and half with tap water and put in a few drops in once or twice a day to melt the blood If the bleeding restarts, or your hearing doesn't improve, I would recommend an appt with [facility] (we can set this up or you probably could also)

## 2011-10-18 NOTE — Assessment & Plan Note (Signed)
May resolve on its own Discussed trying peroxide to melt blood if no better in 2 days Then might need ENT eval if persistent problems

## 2011-10-23 ENCOUNTER — Encounter: Payer: Self-pay | Admitting: Cardiovascular Disease

## 2011-10-23 ENCOUNTER — Ambulatory Visit (INDEPENDENT_AMBULATORY_CARE_PROVIDER_SITE_OTHER): Payer: Medicare Other | Admitting: Cardiovascular Disease

## 2011-10-23 DIAGNOSIS — I471 Supraventricular tachycardia: Secondary | ICD-10-CM

## 2011-10-23 DIAGNOSIS — I4891 Unspecified atrial fibrillation: Secondary | ICD-10-CM

## 2011-10-23 NOTE — Assessment & Plan Note (Signed)
Doing well. No recent episodes of atrial fibrillation. She will take amiodarone and metoprolol when necessary for episodes.

## 2011-10-23 NOTE — Progress Notes (Signed)
Patient ID: Leah Hayden, female    DOB: 02-20-1928, 78 y.o.   MRN: [id number]  HPI Comments: Leah Hayden is a very pleasant 78 year old woman, patient of Dr. Alphonsus Sias, history of SVT as well as pauses on Holter, admitted to the hospital August 2012 with atrial fibrillation and converted shortly after to normal sinus rhythm. One additional episode of atrial fibrillation since then but not for any months.   Overall she feels well with no complaints. She has not had to take any additional medications. She did convert with high-dose flecainide in the emergency room in August 2012. She has amiodarone and propranolol at home as needed but has not had to take this.  She is otherwise active with no complaints.  EKG shows normal sinus rhythm with rate 56 beats per minute with no significant ST or T wave changes     Outpatient Encounter Prescriptions as of 10/23/2011  Medication Sig Dispense Refill  . acetaminophen (TYLENOL) 325 MG tablet Take 650 mg by mouth every 6 (six) hours as needed.        Marland Kitchen albuterol (PROVENTIL HFA) 108 (90 BASE) MCG/ACT inhaler Inhale 2 puffs into the lungs every 6 (six) hours as needed.        Marland Kitchen amiodarone (PACERONE) 400 MG tablet Take 1 tablet (400 mg total) by mouth 3 (three) times daily as needed ( as needed for atrial fibrillation).  15 tablet  1  . aspirin 81 MG tablet 81 mg. Take four tablets daily      . calcium-vitamin D (OSCAL) 250-125 MG-UNIT per tablet Take 1 tablet by mouth daily.        Tery Sanfilippo Sodium (STOOL SOFTENER) 100 MG capsule Take 100 mg by mouth 2 (two) times daily.        . Fluticasone-Salmeterol (ADVAIR DISKUS) 100-50 MCG/DOSE AEPB Inhale 1 puff into the lungs every 12 (twelve) hours.        . mometasone (NASONEX) 50 MCG/ACT nasal spray 2 sprays by Nasal route daily.        . montelukast (SINGULAIR) 10 MG tablet Take 10 mg by mouth daily.        . Multiple Vitamin (MULTIVITAMIN) tablet Take 1 tablet by mouth daily.        . propranolol (INDERAL) 10 MG  tablet Take 1 tablet (10 mg total) by mouth 4 (four) times daily as needed.  30 tablet  12  . senna (SENOKOT) 8.6 MG tablet Take 1 tablet by mouth as needed.         Review of Systems  Constitutional: Negative.   HENT: Negative.   Eyes: Negative.   Respiratory: Negative.   Cardiovascular: Negative.        Previous episode of tachycardia palpitations and shortness of breath on August 25.  Gastrointestinal: Negative.   Musculoskeletal: Negative.   Skin: Negative.   Neurological: Negative.   Hematological: Negative.   Psychiatric/Behavioral: Negative.   All other systems reviewed and are negative.    BP 104/54  Pulse 57  Ht 5\' 8"  (1.727 m)  Wt 155 lb (70.308 kg)  BMI 23.57 kg/m2  Physical Exam  Nursing note and vitals reviewed. Constitutional: She is oriented to person, place, and time. She appears well-developed and well-nourished.  HENT:  Head: Normocephalic.  Nose: Nose normal.  Mouth/Throat: Oropharynx is clear and moist.  Eyes: Conjunctivae are normal. Pupils are equal, round, and reactive to light.  Neck: Normal range of motion. Neck supple. No JVD present.  Cardiovascular: Normal  rate, regular rhythm, S1 normal, S2 normal, normal heart sounds and intact distal pulses.  Exam reveals no gallop and no friction rub.   No murmur heard. Pulmonary/Chest: Effort normal and breath sounds normal. No respiratory distress. She has no wheezes. She has no rales. She exhibits no tenderness.  Abdominal: Soft. Bowel sounds are normal. She exhibits no distension. There is no tenderness.  Musculoskeletal: Normal range of motion. She exhibits no edema and no tenderness.  Lymphadenopathy:    She has no cervical adenopathy.  Neurological: She is alert and oriented to person, place, and time. Coordination normal.  Skin: Skin is warm and dry. No rash noted. No erythema.  Psychiatric: She has a normal mood and affect. Her behavior is normal. Judgment and thought content normal.          Assessment and Plan

## 2011-10-23 NOTE — Patient Instructions (Addendum)
You are doing well. No medication changes were made.  Please call us if you have new issues that need to be addressed before your next appt.  Your physician wants you to follow-up in: 12 months.  You will receive a reminder letter in the mail two months in advance. If you don't receive a letter, please call our office to schedule the follow-up appointment. 

## 2011-10-27 ENCOUNTER — Telehealth: Payer: Self-pay | Admitting: Internal Medicine

## 2011-10-27 NOTE — Telephone Encounter (Signed)
Patient was sent a no show charge for an appointment on 10/06/11.  Patient said she didn't remember the appointment and if a message was left on her answering machine she doesn't listen to her messages very often.  Patient is 78 yrs old.  Can the no show charge be taken off?  The appointment was for a 6 month f/u.

## 2011-11-09 NOTE — Telephone Encounter (Signed)
Sent to charge correction to adjust off charge.

## 2011-12-15 ENCOUNTER — Ambulatory Visit (INDEPENDENT_AMBULATORY_CARE_PROVIDER_SITE_OTHER): Payer: Medicare Other | Admitting: Internal Medicine

## 2011-12-15 ENCOUNTER — Encounter: Payer: Self-pay | Admitting: Internal Medicine

## 2011-12-15 VITALS — BP 154/78 | HR 68 | Temp 98.1°F | Wt 152.0 lb

## 2011-12-15 DIAGNOSIS — J45909 Unspecified asthma, uncomplicated: Secondary | ICD-10-CM

## 2011-12-15 DIAGNOSIS — R82998 Other abnormal findings in urine: Secondary | ICD-10-CM

## 2011-12-15 DIAGNOSIS — R8290 Unspecified abnormal findings in urine: Secondary | ICD-10-CM

## 2011-12-15 DIAGNOSIS — R5381 Other malaise: Secondary | ICD-10-CM

## 2011-12-15 DIAGNOSIS — I4891 Unspecified atrial fibrillation: Secondary | ICD-10-CM

## 2011-12-15 DIAGNOSIS — R634 Abnormal weight loss: Secondary | ICD-10-CM

## 2011-12-15 DIAGNOSIS — R5383 Other fatigue: Secondary | ICD-10-CM

## 2011-12-15 LAB — HEPATIC FUNCTION PANEL
ALT: 15 U/L (ref 0–35)
AST: 16 U/L (ref 0–37)
Albumin: 4 g/dL (ref 3.5–5.2)
Alkaline Phosphatase: 59 U/L (ref 39–117)
Bilirubin, Direct: 0.2 mg/dL (ref 0.0–0.3)
Total Bilirubin: 1.4 mg/dL — ABNORMAL HIGH (ref 0.3–1.2)
Total Protein: 6.7 g/dL (ref 6.0–8.3)

## 2011-12-15 LAB — POCT URINALYSIS DIPSTICK
Bilirubin, UA: NEGATIVE
Glucose, UA: NEGATIVE
Ketones, UA: NEGATIVE
Nitrite, UA: NEGATIVE
Protein, UA: NEGATIVE
Spec Grav, UA: 1.01
Urobilinogen, UA: NEGATIVE
pH, UA: 6.5

## 2011-12-15 LAB — CBC WITH DIFFERENTIAL/PLATELET
Basophils Absolute: 0 10*3/uL (ref 0.0–0.1)
Basophils Relative: 0.3 % (ref 0.0–3.0)
Eosinophils Absolute: 0.1 10*3/uL (ref 0.0–0.7)
Eosinophils Relative: 2.3 % (ref 0.0–5.0)
HCT: 43 % (ref 36.0–46.0)
Hemoglobin: 14.4 g/dL (ref 12.0–15.0)
Lymphocytes Relative: 28.5 % (ref 12.0–46.0)
Lymphs Abs: 1.6 10*3/uL (ref 0.7–4.0)
MCHC: 33.5 g/dL (ref 30.0–36.0)
MCV: 92.6 fl (ref 78.0–100.0)
Monocytes Absolute: 0.4 10*3/uL (ref 0.1–1.0)
Monocytes Relative: 6.6 % (ref 3.0–12.0)
Neutro Abs: 3.5 10*3/uL (ref 1.4–7.7)
Neutrophils Relative %: 62.3 % (ref 43.0–77.0)
Platelets: 188 10*3/uL (ref 150.0–400.0)
RBC: 4.64 Mil/uL (ref 3.87–5.11)
RDW: 13.4 % (ref 11.5–14.6)
WBC: 5.6 10*3/uL (ref 4.5–10.5)

## 2011-12-15 LAB — T4, FREE: Free T4: 1.09 ng/dL (ref 0.60–1.60)

## 2011-12-15 LAB — BASIC METABOLIC PANEL
BUN: 19 mg/dL (ref 6–23)
CO2: 28 mEq/L (ref 19–32)
Calcium: 9.4 mg/dL (ref 8.4–10.5)
Chloride: 103 mEq/L (ref 96–112)
Creatinine, Ser: 0.7 mg/dL (ref 0.4–1.2)
GFR: 86.6 mL/min (ref >60.00)
Glucose, Bld: 104 mg/dL — ABNORMAL HIGH (ref 70–99)
Potassium: 4.2 mEq/L (ref 3.5–5.1)
Sodium: 140 mEq/L (ref 135–145)

## 2011-12-15 LAB — SEDIMENTATION RATE: Sed Rate: 12 mm/hr (ref 0–22)

## 2011-12-15 LAB — TSH: TSH: 0.75 u[IU]/mL (ref 0.35–5.50)

## 2011-12-15 MED ORDER — CIPROFLOXACIN HCL 250 MG PO TABS: 250.0000 mg | ORAL_TABLET | Freq: Two times a day (BID) | ORAL | Status: AC

## 2011-12-15 NOTE — Patient Instructions (Signed)
Please call for appointment for reevaluation if you continue to lose weight or you are not feeling right

## 2011-12-15 NOTE — Progress Notes (Signed)
Subjective:    Patient ID: Leah Hayden, female    DOB: 1928/03/09, 78 y.o.   MRN: [id number]  HPI Had some loose stools around Easter Better after 24 hours Doesn't feel that she has bounced back No further nausea or vomiting  Has noted some change in urine Smells bad No dysuria or blood  No apparent problems with her heart Hasn't needed beta blocker or her amiodarone  Weight is down some Appetite is fair--no change  No problems with allergies No sig cough or SOB Still works full time  Current Outpatient Prescriptions on File Prior to Visit  Medication Sig Dispense Refill  . acetaminophen (TYLENOL) 325 MG tablet Take 650 mg by mouth every 6 (six) hours as needed.        Marland Kitchen albuterol (PROVENTIL HFA) 108 (90 BASE) MCG/ACT inhaler Inhale 2 puffs into the lungs every 6 (six) hours as needed.        Marland Kitchen amiodarone (PACERONE) 400 MG tablet Take 1 tablet (400 mg total) by mouth 3 (three) times daily as needed ( as needed for atrial fibrillation).  15 tablet  1  . aspirin 81 MG tablet 81 mg. Take four tablets daily      . calcium-vitamin D (OSCAL) 250-125 MG-UNIT per tablet Take 1 tablet by mouth daily.        Tery Sanfilippo Sodium (STOOL SOFTENER) 100 MG capsule Take 100 mg by mouth 2 (two) times daily.        . Fluticasone-Salmeterol (ADVAIR DISKUS) 100-50 MCG/DOSE AEPB Inhale 1 puff into the lungs every 12 (twelve) hours.        . mometasone (NASONEX) 50 MCG/ACT nasal spray 2 sprays by Nasal route daily.        . montelukast (SINGULAIR) 10 MG tablet Take 10 mg by mouth daily.        . Multiple Vitamin (MULTIVITAMIN) tablet Take 1 tablet by mouth daily.        . propranolol (INDERAL) 10 MG tablet Take 1 tablet (10 mg total) by mouth 4 (four) times daily as needed.  30 tablet  12  . senna (SENOKOT) 8.6 MG tablet Take 1 tablet by mouth as needed.         Allergies  Allergen Reactions  . Latex     Sensitivity     Past Medical History  Diagnosis Date  . Allergic rhinitis   . Asthma    . HX: breast cancer   . Hx of colonic polyps   . Diverticulosis of colon   . GERD (gastroesophageal reflux disease)   . Meniere's disease   . Osteoarthritis   . Sinus brady-tachy syndrome   . Atrial fibrillation     Past Surgical History  Procedure Date  . Mastectomy 1984    left   . Mastectomy 1985    right  . Partial hysterectomy 1966    w/ bladder tack  . Correction hammer toe 1997    2nd to left toe  . Esophagogastroduodenoscopy 10/1999    inflammation only   . Toe surgery 2001/2002    2nd/3rd,left   . Rotator cuff repair 10/09    right   . Abdominal hysterectomy     Family History  Problem Relation Age of Onset  . Heart failure Mother   . Gout Mother   . Heart disease Mother     heart failure  . Arthritis Mother   . Heart failure Father   . Heart disease Father  heart failure  . Diabetes Maternal Grandmother   . Colon cancer Maternal Grandfather   . Cancer Maternal Grandfather     colon  . Arthritis Brother     History   Social History  . Marital Status: Widowed    Spouse Name: N/A    Number of Children: 2  . Years of Education: N/A   Occupational History  . Retired     Research officer, trade union   Social History Main Topics  . Smoking status: Never Smoker   . Smokeless tobacco: Never Used  . Alcohol Use: No  . Drug Use: No  . Sexually Active: Not on file   Other Topics Concern  . Not on file   Social History Narrative  . No narrative on file   Review of Systems No mood problems---doesn't feel depressed Not anhedonic    Objective:   Physical Exam  Constitutional: She appears well-developed and well-nourished. No distress.  HENT:  Mouth/Throat: Oropharynx is clear and moist. No oropharyngeal exudate.  Neck: Normal range of motion. Neck supple. No thyromegaly present.  Cardiovascular: Normal rate, regular rhythm, normal heart sounds and intact distal pulses.  Exam reveals no gallop.   No murmur heard. Pulmonary/Chest: Effort  normal and breath sounds normal. No respiratory distress. She has no wheezes. She has no rales.  Abdominal: Soft. She exhibits no mass. There is no tenderness.  Musculoskeletal: She exhibits no edema and no tenderness.  Lymphadenopathy:    She has no cervical adenopathy.    She has no axillary adenopathy.       Right: No inguinal adenopathy present.       Left: No inguinal adenopathy present.  Skin: No rash noted.  Psychiatric: She has a normal mood and affect. Her behavior is normal. Thought content normal.          Assessment & Plan:

## 2011-12-15 NOTE — Assessment & Plan Note (Signed)
Just doesn't feel right Nothing on exam Will check labs

## 2011-12-15 NOTE — Assessment & Plan Note (Signed)
Has changed diet due to concerns about her atrial fib Not on coumadin so should liberalize Will check labs

## 2011-12-15 NOTE — Assessment & Plan Note (Signed)
Doesn't seem to have had any spells Hasn't used the amiodarone

## 2011-12-15 NOTE — Assessment & Plan Note (Signed)
Seems to be controlled 

## 2011-12-19 ENCOUNTER — Encounter: Payer: Self-pay | Admitting: *Deleted

## 2012-05-03 ENCOUNTER — Encounter: Payer: Self-pay | Admitting: Internal Medicine

## 2012-05-03 ENCOUNTER — Ambulatory Visit (INDEPENDENT_AMBULATORY_CARE_PROVIDER_SITE_OTHER): Payer: Medicare Other | Admitting: Internal Medicine

## 2012-05-03 VITALS — BP 128/60 | HR 66 | Temp 97.8°F | Ht 68.0 in | Wt 149.0 lb

## 2012-05-03 DIAGNOSIS — J45909 Unspecified asthma, uncomplicated: Secondary | ICD-10-CM

## 2012-05-03 DIAGNOSIS — Z0000 Encounter for general adult medical examination without abnormal findings: Secondary | ICD-10-CM

## 2012-05-03 DIAGNOSIS — I4891 Unspecified atrial fibrillation: Secondary | ICD-10-CM

## 2012-05-03 DIAGNOSIS — Z23 Encounter for immunization: Secondary | ICD-10-CM

## 2012-05-03 DIAGNOSIS — L570 Actinic keratosis: Secondary | ICD-10-CM

## 2012-05-03 NOTE — Assessment & Plan Note (Signed)
Good control on current regimen. 

## 2012-05-03 NOTE — Assessment & Plan Note (Signed)
Regular rhythm Hasn't needed prn meds ASA only

## 2012-05-03 NOTE — Progress Notes (Signed)
Subjective:    Patient ID: Leah Hayden, female    DOB: 1927-10-14, 78 y.o.   MRN: [id number]  HPI Here for physical Doing well Weight down slightly but eats 3 meals and 2 snacks a day Feels well Reviewed advanced directives A couple of minor falls without sig injury  Doesn't feel her heart No palpitations Hasn't needed amiodarone or propranolol  Continues on allergy/asthma regimen Feels things are fairly quiet Does get tickle in throat---usually okay with taking a drink  Current Outpatient Prescriptions on File Prior to Visit  Medication Sig Dispense Refill  . acetaminophen (TYLENOL) 325 MG tablet Take 650 mg by mouth every 6 (six) hours as needed.        Marland Kitchen albuterol (PROVENTIL HFA) 108 (90 BASE) MCG/ACT inhaler Inhale 2 puffs into the lungs every 6 (six) hours as needed.        Marland Kitchen aspirin 81 MG tablet 81 mg. Take four tablets daily      . calcium-vitamin D (OSCAL) 250-125 MG-UNIT per tablet Take 1 tablet by mouth daily.        Tery Sanfilippo Sodium (STOOL SOFTENER) 100 MG capsule Take 100 mg by mouth 2 (two) times daily.        . Fluticasone-Salmeterol (ADVAIR DISKUS) 100-50 MCG/DOSE AEPB Inhale 1 puff into the lungs every 12 (twelve) hours.        . mometasone (NASONEX) 50 MCG/ACT nasal spray 2 sprays by Nasal route daily.        . montelukast (SINGULAIR) 10 MG tablet Take 10 mg by mouth daily.        . Multiple Vitamin (MULTIVITAMIN) tablet Take 1 tablet by mouth daily.        Marland Kitchen senna (SENOKOT) 8.6 MG tablet Take 1 tablet by mouth as needed.       Marland Kitchen DISCONTD: amiodarone (PACERONE) 400 MG tablet Take 1 tablet (400 mg total) by mouth 3 (three) times daily as needed ( as needed for atrial fibrillation).  15 tablet  1  . DISCONTD: propranolol (INDERAL) 10 MG tablet Take 1 tablet (10 mg total) by mouth 4 (four) times daily as needed.  30 tablet  12    Allergies  Allergen Reactions  . Latex     Sensitivity     Past Medical History  Diagnosis Date  . Allergic rhinitis   .  Asthma   . HX: breast cancer   . Hx of colonic polyps   . Diverticulosis of colon   . GERD (gastroesophageal reflux disease)   . Meniere's disease   . Osteoarthritis   . Sinus brady-tachy syndrome   . Atrial fibrillation     Past Surgical History  Procedure Date  . Mastectomy 1984    left   . Mastectomy 1985    right  . Partial hysterectomy 1966    w/ bladder tack  . Correction hammer toe 1997    2nd to left toe  . Esophagogastroduodenoscopy 10/1999    inflammation only   . Toe surgery 2001/2002    2nd/3rd,left   . Rotator cuff repair 10/09    right   . Abdominal hysterectomy     Family History  Problem Relation Age of Onset  . Heart failure Mother   . Gout Mother   . Heart disease Mother     heart failure  . Arthritis Mother   . Heart failure Father   . Heart disease Father     heart failure  . Diabetes Maternal Grandmother   .  Colon cancer Maternal Grandfather   . Cancer Maternal Grandfather     colon  . Arthritis Brother     History   Social History  . Marital Status: Widowed    Spouse Name: N/A    Number of Children: 2  . Years of Education: N/A   Occupational History  . Retired     Research officer, trade union   Social History Main Topics  . Smoking status: Never Smoker   . Smokeless tobacco: Never Used  . Alcohol Use: No  . Drug Use: No  . Sexually Active: Not on file   Other Topics Concern  . Not on file   Social History Narrative   No living willNo health care POA but requests both sons to be health care POAsForms given for review 9/20/13Requests DNRNo tube feeds if cognitively unaware   Review of Systems  Constitutional: Positive for unexpected weight change. Negative for fatigue.       Wears seat belt  HENT: Positive for hearing loss, congestion and rhinorrhea. Negative for dental problem and tinnitus.        Mild hearing loss Allergies controlled Regular with dentist  Eyes: Negative for visual disturbance.       No diplopia or  unilateral vision loss Early right cataract--sees [place]  Respiratory: Positive for cough. Negative for chest tightness and shortness of breath.   Cardiovascular: Negative for chest pain, palpitations and leg swelling.  Gastrointestinal: Positive for constipation. Negative for nausea, vomiting, abdominal pain and blood in stool.       Constipation controlled with prune juice and stool softener  Genitourinary: Negative for dysuria, frequency and difficulty urinating.  Musculoskeletal: Positive for arthralgias. Negative for back pain and joint swelling.       Right 2nd hammertoe--not too bad Left 2nd DIP painful at times (hand)  Skin: Negative for rash.       2 persistent scaly lesions---right hand and arm  Neurological: Negative for dizziness, syncope, weakness, light-headedness, numbness and headaches.       Occ brief tingling in legs  Hematological: Negative for adenopathy. Bruises/bleeds easily.  Psychiatric/Behavioral: Negative for disturbed wake/sleep cycle and dysphoric mood. The patient is not nervous/anxious.        Objective:   Physical Exam  Constitutional: She is oriented to person, place, and time. She appears well-developed and well-nourished. No distress.  HENT:  Head: Normocephalic and atraumatic.  Right Ear: External ear normal.  Left Ear: External ear normal.  Mouth/Throat: Oropharynx is clear and moist. No oropharyngeal exudate.  Eyes: Conjunctivae normal and EOM are normal. Pupils are equal, round, and reactive to light.  Neck: Normal range of motion. Neck supple. No thyromegaly present.  Cardiovascular: Normal rate, regular rhythm, normal heart sounds and intact distal pulses.  Exam reveals no gallop.   No murmur heard. Pulmonary/Chest: Effort normal and breath sounds normal. No respiratory distress. She has no wheezes. She has no rales.  Abdominal: Soft. There is no tenderness.  Musculoskeletal: She exhibits no edema and no tenderness.       Mild hammertoe--  right 2nd. Slight redness at DIP there  Lymphadenopathy:    She has no cervical adenopathy.  Neurological: She is alert and oriented to person, place, and time.  Skin: No rash noted.       2 small actinics--right hand and arm  Psychiatric: She has a normal mood and affect. Her behavior is normal.          Assessment & Plan:

## 2012-05-03 NOTE — Assessment & Plan Note (Signed)
Liquid nitrogen 45 seconds x 2 to each lesion Discussed home care

## 2012-05-03 NOTE — Assessment & Plan Note (Signed)
Generally healthy Did have further slight weight loss---discussed eating and exercise Update flu No other preventative care needs Still works full time

## 2012-06-23 ENCOUNTER — Telehealth: Payer: Self-pay | Admitting: Physician Assistant

## 2012-06-23 NOTE — Telephone Encounter (Signed)
Patient called answering service re: irregular heart beat. She has a history of paroxysmal a-fib for which she takes PRN propanolol, amiodarone as recommended by Dr. Mariah Milling for paroxysms. She has taken these together twice today with improvement. She denies SOB, lightheadedness, weakness, presyncope or syncope. Rate is well-controlled. Advised she may be back in atrial fibrillation. There have been discussions as to resuming anticoagulation in the past (not currently on bc of long periods in sinus?), however now she is only on a low-dose ASA. Advised to continue PRN dosing of these medications. If she develops symptoms, or HR increases/BP decreases to present to the ED for formal evaluation. Will forward note to [facility] office to get an EKG tomorrow with possible Holter monitoring for further evaluation. Further recommendations regarding rate/rhythm control and anticoagulation can be made at that time. She understood and agreed.    Jacqulyn Bath, PA-C 06/23/2012 1:18 PM

## 2012-06-23 NOTE — Telephone Encounter (Signed)
Needs ekg in office

## 2012-06-24 ENCOUNTER — Ambulatory Visit (INDEPENDENT_AMBULATORY_CARE_PROVIDER_SITE_OTHER): Payer: Medicare Other

## 2012-06-24 ENCOUNTER — Other Ambulatory Visit: Payer: Self-pay

## 2012-06-24 VITALS — BP 108/50 | HR 58 | Ht 66.0 in | Wt 152.0 lb

## 2012-06-24 DIAGNOSIS — I4891 Unspecified atrial fibrillation: Secondary | ICD-10-CM

## 2012-06-24 MED ORDER — AMIODARONE HCL 400 MG PO TABS: 400.0000 mg | ORAL_TABLET | Freq: Three times a day (TID) | ORAL | Status: DC | PRN

## 2012-06-24 MED ORDER — PROPRANOLOL HCL 10 MG PO TABS: 10.0000 mg | ORAL_TABLET | Freq: Four times a day (QID) | ORAL | Status: DC | PRN

## 2012-06-24 NOTE — Progress Notes (Signed)
Pt has paroxysmal atrial fib; Takes propanolol and amiodarone PRN atrial fib Had a spell of atrial fib yesterday, called and spoke with PA on call She took her PRN propanolol and amiodarone This am she feels she has converted back to NSR but feels "foggy headed". Denies sob or CP Has not taken any more propanolol or amiodarone since yesterday  EKG obtained, shows Sinus brady  Will review with Dr. Mariah Milling  Reviewed with Dr. Mariah Milling Per Dr. Mariah Milling, "Order a 30 day event monitor to assess frequency of atrial fib. No changes to meds" V.O Dr. Alvis Lemmings, RN  Pt informed. She will let us know should symptoms get worse/change Otherwise she will wait on monitor

## 2012-06-24 NOTE — Patient Instructions (Addendum)
Await 30 day event monitor Call us if symptoms get worse

## 2012-06-24 NOTE — Telephone Encounter (Signed)
Coming in today 11/11

## 2012-07-02 ENCOUNTER — Encounter (INDEPENDENT_AMBULATORY_CARE_PROVIDER_SITE_OTHER): Payer: Medicare Other

## 2012-07-02 DIAGNOSIS — I4891 Unspecified atrial fibrillation: Secondary | ICD-10-CM

## 2012-07-08 NOTE — Addendum Note (Signed)
Addended by: Antonieta Iba on: 07/08/2012 01:35 PM   Modules accepted: Level of Service

## 2012-08-01 NOTE — Progress Notes (Unsigned)
Patient activated monitor with ecardio

## 2012-08-16 ENCOUNTER — Telehealth: Payer: Self-pay | Admitting: Cardiovascular Disease

## 2012-08-16 NOTE — Telephone Encounter (Signed)
Placed in Dr. Windell Hummingbird box for review

## 2012-08-16 NOTE — Telephone Encounter (Signed)
Pt calling for results of holter monitor

## 2012-08-19 ENCOUNTER — Telehealth: Payer: Self-pay

## 2012-08-19 NOTE — Telephone Encounter (Signed)
lmtcb

## 2012-08-19 NOTE — Telephone Encounter (Signed)
Pt informed Understanding verb 

## 2012-08-19 NOTE — Telephone Encounter (Signed)
Dr. Mariah Milling reviewed pt's EM results They are as follows: "NSR with rare short episodes of 8 beat SVT on 07/10/12 @ 0601" VO Dr. Alvis Lemmings, RN

## 2012-10-23 ENCOUNTER — Ambulatory Visit (INDEPENDENT_AMBULATORY_CARE_PROVIDER_SITE_OTHER): Payer: Medicare Other | Admitting: Cardiovascular Disease

## 2012-10-23 ENCOUNTER — Encounter: Payer: Self-pay | Admitting: Cardiovascular Disease

## 2012-10-23 VITALS — BP 140/68 | HR 58 | Ht 68.0 in | Wt 156.5 lb

## 2012-10-23 DIAGNOSIS — I4891 Unspecified atrial fibrillation: Secondary | ICD-10-CM

## 2012-10-23 NOTE — Patient Instructions (Addendum)
You are doing well. No medication changes were made.  Please call us if you have new issues that need to be addressed before your next appt.  Your physician wants you to follow-up in: 12 months.  You will receive a reminder letter in the mail two months in advance. If you don't receive a letter, please call our office to schedule the follow-up appointment. 

## 2012-10-23 NOTE — Progress Notes (Signed)
Patient ID: Leah Hayden, female    DOB: 05/02/1928, 79 y.o.   MRN: [id number]  HPI Comments: Leah Hayden is a very pleasant 79 year old woman, patient of Dr. Alphonsus Sias, history of SVT as well as pauses on Holter, admitted to the hospital August 2012 with atrial fibrillation and converted shortly after to normal sinus rhythm. At least one additional episode of atrial fibrillation since then.  Since her last seen her, she reports having one episode of arrhythmia. It lasted more than one day. She took amiodarone with propranolol and after one day, it seemed to resolve on its own. She was not certain that it was atrial fibrillation but it did feel different from her normal rhythm. Occasionally she has palpitations. Otherwise she is doing well with no complaints. She is relatively active, balance is stable  EKG shows normal sinus rhythm with rate 58 beats per minute with no significant ST or T wave changes     Outpatient Encounter Prescriptions as of 10/23/2012  Medication Sig Dispense Refill  . acetaminophen (TYLENOL) 325 MG tablet Take 650 mg by mouth every 6 (six) hours as needed.        Marland Kitchen albuterol (PROVENTIL HFA) 108 (90 BASE) MCG/ACT inhaler Inhale 2 puffs into the lungs every 6 (six) hours as needed.        Marland Kitchen amiodarone (PACERONE) 400 MG tablet Take 1 tablet (400 mg total) by mouth 3 (three) times daily as needed.  90 tablet  3  . Ascorbic Acid (VITAMIN C) 1000 MG tablet Take 1,000 mg by mouth daily.      Marland Kitchen aspirin 81 MG tablet 81 mg.       . calcium-vitamin D (OSCAL) 250-125 MG-UNIT per tablet Take 1 tablet by mouth daily.        Tery Sanfilippo Sodium (STOOL SOFTENER) 100 MG capsule Take 100 mg by mouth 2 (two) times daily.        . Fluticasone-Salmeterol (ADVAIR DISKUS) 100-50 MCG/DOSE AEPB Inhale 1 puff into the lungs every 12 (twelve) hours.        . mometasone (NASONEX) 50 MCG/ACT nasal spray 2 sprays by Nasal route daily.        . montelukast (SINGULAIR) 10 MG tablet Take 10 mg by mouth  daily.        . Multiple Vitamin (MULTIVITAMIN) tablet Take 1 tablet by mouth daily.        . propranolol (INDERAL) 10 MG tablet Take 1 tablet (10 mg total) by mouth 4 (four) times daily as needed.  120 tablet  3  . senna (SENOKOT) 8.6 MG tablet Take 1 tablet by mouth as needed.        No facility-administered encounter medications on file as of 10/23/2012.    Review of Systems  Constitutional: Negative.   HENT: Negative.   Eyes: Negative.   Respiratory: Negative.   Cardiovascular: Positive for palpitations.  Gastrointestinal: Negative.   Musculoskeletal: Negative.   Skin: Negative.   Neurological: Negative.   Psychiatric/Behavioral: Negative.   All other systems reviewed and are negative.    BP 140/68  Pulse 58  Ht 5\' 8"  (1.727 m)  Wt 156 lb 8 oz (70.988 kg)  BMI 23.8 kg/m2  Physical Exam  Nursing note and vitals reviewed. Constitutional: She is oriented to person, place, and time. She appears well-developed and well-nourished.  HENT:  Head: Normocephalic.  Nose: Nose normal.  Mouth/Throat: Oropharynx is clear and moist.  Eyes: Conjunctivae are normal. Pupils are equal, round, and reactive  to light.  Neck: Normal range of motion. Neck supple. No JVD present.  Cardiovascular: Normal rate, regular rhythm, S1 normal, S2 normal, normal heart sounds and intact distal pulses.  Exam reveals no gallop and no friction rub.   No murmur heard. Pulmonary/Chest: Effort normal and breath sounds normal. No respiratory distress. She has no wheezes. She has no rales. She exhibits no tenderness.  Abdominal: Soft. Bowel sounds are normal. She exhibits no distension. There is no tenderness.  Musculoskeletal: Normal range of motion. She exhibits no edema and no tenderness.  Lymphadenopathy:    She has no cervical adenopathy.  Neurological: She is alert and oriented to person, place, and time. Coordination normal.  Skin: Skin is warm and dry. No rash noted. No erythema.  Psychiatric: She has  a normal mood and affect. Her behavior is normal. Judgment and thought content normal.    Assessment and Plan

## 2012-10-23 NOTE — Assessment & Plan Note (Signed)
Possibly one episode by her report, resolved with when necessary amiodarone and propranolol. She does not take this on a regular basis.

## 2012-11-07 ENCOUNTER — Ambulatory Visit: Payer: Self-pay | Admitting: Ophthalmology

## 2012-11-20 ENCOUNTER — Ambulatory Visit: Payer: Self-pay | Admitting: Ophthalmology

## 2012-12-24 ENCOUNTER — Other Ambulatory Visit: Payer: Self-pay | Admitting: Orthopedic Surgery

## 2012-12-27 ENCOUNTER — Encounter (HOSPITAL_BASED_OUTPATIENT_CLINIC_OR_DEPARTMENT_OTHER): Payer: Self-pay | Admitting: *Deleted

## 2012-12-27 NOTE — Progress Notes (Signed)
Pt 84 and still works full time [employer]-to come in for bmet-sees dr [employer] for [employer] hr-only takes meds as needed- Pt was here 09 for shoulder rcr-did well

## 2012-12-30 ENCOUNTER — Encounter (HOSPITAL_BASED_OUTPATIENT_CLINIC_OR_DEPARTMENT_OTHER)
Admission: RE | Admit: 2012-12-30 | Discharge: 2012-12-30 | Disposition: A | Discharge status: Home / Self Care | Payer: Medicare Other | Source: Ambulatory Visit | Attending: Orthopedic Surgery | Admitting: Orthopedic Surgery

## 2012-12-30 LAB — BASIC METABOLIC PANEL
BUN: 20 mg/dL (ref 6–23)
CO2: 26 mEq/L (ref 19–32)
Calcium: 9.3 mg/dL (ref 8.4–10.5)
Chloride: 104 mEq/L (ref 96–112)
Creatinine, Ser: 0.66 mg/dL (ref 0.50–1.10)
GFR calc Af Amer: >90 mL/min (ref >90)
GFR calc non Af Amer: 80 mL/min — ABNORMAL LOW (ref >90)
Glucose, Bld: 164 mg/dL — ABNORMAL HIGH (ref 70–99)
Potassium: 4.4 mEq/L (ref 3.5–5.1)
Sodium: 139 mEq/L (ref 135–145)

## 2013-01-02 ENCOUNTER — Ambulatory Visit (HOSPITAL_BASED_OUTPATIENT_CLINIC_OR_DEPARTMENT_OTHER): Payer: Medicare Other | Admitting: Anesthesiology

## 2013-01-02 ENCOUNTER — Encounter (HOSPITAL_BASED_OUTPATIENT_CLINIC_OR_DEPARTMENT_OTHER)
Admission: RE | Disposition: A | Discharge status: Home / Self Care | Payer: Self-pay | Source: Ambulatory Visit | Attending: Orthopedic Surgery

## 2013-01-02 ENCOUNTER — Encounter (HOSPITAL_BASED_OUTPATIENT_CLINIC_OR_DEPARTMENT_OTHER): Payer: Self-pay | Admitting: Anesthesiology

## 2013-01-02 ENCOUNTER — Encounter (HOSPITAL_BASED_OUTPATIENT_CLINIC_OR_DEPARTMENT_OTHER): Payer: Self-pay | Admitting: *Deleted

## 2013-01-02 ENCOUNTER — Ambulatory Visit (HOSPITAL_BASED_OUTPATIENT_CLINIC_OR_DEPARTMENT_OTHER)
Admission: RE | Admit: 2013-01-02 | Discharge: 2013-01-02 | Disposition: A | Discharge status: Home / Self Care | Payer: Medicare Other | Source: Ambulatory Visit | Attending: Orthopedic Surgery | Admitting: Orthopedic Surgery

## 2013-01-02 DIAGNOSIS — Z1881 Retained glass fragments: Secondary | ICD-10-CM | POA: Insufficient documentation

## 2013-01-02 DIAGNOSIS — Z853 Personal history of malignant neoplasm of breast: Secondary | ICD-10-CM | POA: Insufficient documentation

## 2013-01-02 DIAGNOSIS — J45909 Unspecified asthma, uncomplicated: Secondary | ICD-10-CM | POA: Insufficient documentation

## 2013-01-02 DIAGNOSIS — I4891 Unspecified atrial fibrillation: Secondary | ICD-10-CM | POA: Insufficient documentation

## 2013-01-02 DIAGNOSIS — K219 Gastro-esophageal reflux disease without esophagitis: Secondary | ICD-10-CM | POA: Insufficient documentation

## 2013-01-02 DIAGNOSIS — D2110 Benign neoplasm of connective and other soft tissue of unspecified upper limb, including shoulder: Secondary | ICD-10-CM | POA: Insufficient documentation

## 2013-01-02 DIAGNOSIS — M6020 Foreign body granuloma of soft tissue, not elsewhere classified, unspecified site: Secondary | ICD-10-CM | POA: Insufficient documentation

## 2013-01-02 DIAGNOSIS — Z9010 Acquired absence of unspecified breast and nipple: Secondary | ICD-10-CM | POA: Insufficient documentation

## 2013-01-02 DIAGNOSIS — M1990 Unspecified osteoarthritis, unspecified site: Secondary | ICD-10-CM | POA: Insufficient documentation

## 2013-01-02 DIAGNOSIS — H8109 Meniere's disease, unspecified ear: Secondary | ICD-10-CM | POA: Insufficient documentation

## 2013-01-02 DIAGNOSIS — L989 Disorder of the skin and subcutaneous tissue, unspecified: Secondary | ICD-10-CM | POA: Insufficient documentation

## 2013-01-02 DIAGNOSIS — I495 Sick sinus syndrome: Secondary | ICD-10-CM | POA: Insufficient documentation

## 2013-01-02 HISTORY — PX: FOREIGN BODY REMOVAL: SHX962

## 2013-01-02 LAB — POCT HEMOGLOBIN-HEMACUE: Hemoglobin: 13.7 g/dL (ref 12.0–15.0)

## 2013-01-02 SURGERY — EXCISION METACARPAL MASS
Anesthesia: General | Site: Hand | Laterality: Right | Wound class: Clean

## 2013-01-02 MED ORDER — LIDOCAINE HCL 2 % IJ SOLN
INTRAMUSCULAR | Status: DC | PRN
  Administered 2013-01-02: 3 mL

## 2013-01-02 MED ORDER — PROMETHAZINE HCL 25 MG/ML IJ SOLN: 6.2500 mg | INTRAMUSCULAR | Status: DC | PRN

## 2013-01-02 MED ORDER — HYDROMORPHONE HCL PF 1 MG/ML IJ SOLN
0.2500 mg | INTRAMUSCULAR | Status: DC | PRN
Start: 2013-01-02 — End: 2013-01-02

## 2013-01-02 MED ORDER — GLYCOPYRROLATE 0.2 MG/ML IJ SOLN
INTRAMUSCULAR | Status: DC | PRN
  Administered 2013-01-02: 0.2 mg via INTRAVENOUS

## 2013-01-02 MED ORDER — OXYCODONE HCL 5 MG/5ML PO SOLN: 5.0000 mg | Freq: Once | ORAL | Status: DC | PRN

## 2013-01-02 MED ORDER — OXYCODONE HCL 5 MG PO TABS: 5.0000 mg | ORAL_TABLET | Freq: Once | ORAL | Status: DC | PRN

## 2013-01-02 MED ORDER — OXYCODONE-ACETAMINOPHEN 5-325 MG PO TABS: ORAL_TABLET | ORAL | Status: DC

## 2013-01-02 MED ORDER — ONDANSETRON HCL 4 MG/2ML IJ SOLN
INTRAMUSCULAR | Status: DC | PRN
  Administered 2013-01-02: 4 mg via INTRAVENOUS

## 2013-01-02 MED ORDER — LACTATED RINGERS IV SOLN
INTRAVENOUS | Status: DC
  Administered 2013-01-02: 08:00:00 via INTRAVENOUS

## 2013-01-02 MED ORDER — CHLORHEXIDINE GLUCONATE 4 % EX LIQD: 60.0000 mL | Freq: Once | CUTANEOUS | Status: DC

## 2013-01-02 MED ORDER — PROPOFOL 10 MG/ML IV BOLUS
INTRAVENOUS | Status: DC | PRN
  Administered 2013-01-02: 120 mg via INTRAVENOUS

## 2013-01-02 MED ORDER — FENTANYL CITRATE 0.05 MG/ML IJ SOLN
INTRAMUSCULAR | Status: DC | PRN
  Administered 2013-01-02: 100 ug via INTRAVENOUS

## 2013-01-02 SURGICAL SUPPLY — 66 items
BANDAGE ADHESIVE 1X3 (GAUZE/BANDAGES/DRESSINGS) IMPLANT
BANDAGE ELASTIC 3 VELCRO ST LF (GAUZE/BANDAGES/DRESSINGS) ×2 IMPLANT
BANDAGE ELASTIC 4 VELCRO ST LF (GAUZE/BANDAGES/DRESSINGS) IMPLANT
BANDAGE GAUZE ELAST BULKY 4 IN (GAUZE/BANDAGES/DRESSINGS) IMPLANT
BLADE MINI RND TIP GREEN BEAV (BLADE) IMPLANT
BLADE SURG 15 STRL LF DISP TIS (BLADE) ×1 IMPLANT
BLADE SURG 15 STRL SS (BLADE) ×1
BNDG COHESIVE 1X5 TAN STRL LF (GAUZE/BANDAGES/DRESSINGS) ×2 IMPLANT
BNDG ELASTIC 2 VLCR STRL LF (GAUZE/BANDAGES/DRESSINGS) IMPLANT
BNDG ESMARK 4X9 LF (GAUZE/BANDAGES/DRESSINGS) ×2 IMPLANT
BRUSH SCRUB EZ PLAIN DRY (MISCELLANEOUS) ×2 IMPLANT
CANISTER SUCTION 1200CC (MISCELLANEOUS) IMPLANT
CLOTH BEACON ORANGE TIMEOUT ST (SAFETY) ×2 IMPLANT
CORDS BIPOLAR (ELECTRODE) ×2 IMPLANT
COVER MAYO STAND STRL (DRAPES) ×2 IMPLANT
COVER TABLE BACK 60X90 (DRAPES) ×2 IMPLANT
CUFF TOURNIQUET SINGLE 18IN (TOURNIQUET CUFF) ×2 IMPLANT
DECANTER SPIKE VIAL GLASS SM (MISCELLANEOUS) IMPLANT
DRAPE EXTREMITY T 121X128X90 (DRAPE) ×2 IMPLANT
DRAPE OEC MINIVIEW 54X84 (DRAPES) IMPLANT
DRAPE SURG 17X23 STRL (DRAPES) ×2 IMPLANT
GAUZE SPONGE 4X4 16PLY XRAY LF (GAUZE/BANDAGES/DRESSINGS) IMPLANT
GAUZE XEROFORM 1X8 LF (GAUZE/BANDAGES/DRESSINGS) IMPLANT
GLOVE BIO SURGEON STRL SZ 6.5 (GLOVE) ×2 IMPLANT
GLOVE BIOGEL M STRL SZ7.5 (GLOVE) ×2 IMPLANT
GLOVE BIOGEL PI IND STRL 7.5 (GLOVE) ×1 IMPLANT
GLOVE BIOGEL PI IND STRL 8 (GLOVE) ×1 IMPLANT
GLOVE BIOGEL PI INDICATOR 7.5 (GLOVE) ×1
GLOVE BIOGEL PI INDICATOR 8 (GLOVE) ×1
GLOVE ECLIPSE 7.0 STRL STRAW (GLOVE) ×2 IMPLANT
GLOVE ORTHO TXT STRL SZ7.5 (GLOVE) ×2 IMPLANT
GOWN BRE IMP PREV XXLGXLNG (GOWN DISPOSABLE) IMPLANT
GOWN PREVENTION PLUS XLARGE (GOWN DISPOSABLE) IMPLANT
NDL SAFETY ECLIPSE 18X1.5 (NEEDLE) ×1 IMPLANT
NEEDLE 27GAX1X1/2 (NEEDLE) ×2 IMPLANT
NEEDLE HYPO 18GX1.5 SHARP (NEEDLE) ×1
NS IRRIG 1000ML POUR BTL (IV SOLUTION) ×2 IMPLANT
PACK BASIN DAY SURGERY FS (CUSTOM PROCEDURE TRAY) ×2 IMPLANT
PAD CAST 3X4 CTTN HI CHSV (CAST SUPPLIES) ×2 IMPLANT
PAD CAST 4YDX4 CTTN HI CHSV (CAST SUPPLIES) IMPLANT
PADDING CAST ABS 4INX4YD NS (CAST SUPPLIES) ×1
PADDING CAST ABS COTTON 4X4 ST (CAST SUPPLIES) ×1 IMPLANT
PADDING CAST COTTON 3X4 STRL (CAST SUPPLIES) ×2
PADDING CAST COTTON 4X4 STRL (CAST SUPPLIES)
PADDING UNDERCAST 2  STERILE (CAST SUPPLIES) IMPLANT
SPLINT PLASTER CAST XFAST 3X15 (CAST SUPPLIES) ×5 IMPLANT
SPLINT PLASTER XTRA FASTSET 3X (CAST SUPPLIES) ×5
SPONGE GAUZE 4X4 12PLY (GAUZE/BANDAGES/DRESSINGS) IMPLANT
STOCKINETTE 4X48 STRL (DRAPES) ×2 IMPLANT
STRIP CLOSURE SKIN 1/2X4 (GAUZE/BANDAGES/DRESSINGS) ×2 IMPLANT
SUCTION FRAZIER TIP 10 FR DISP (SUCTIONS) IMPLANT
SUT CHROMIC 6 0 PS 4 (SUTURE) IMPLANT
SUT ETHILON 4 0 PS 2 18 (SUTURE) IMPLANT
SUT MERSILENE 4 0 P 3 (SUTURE) IMPLANT
SUT PROLENE 3 0 PS 2 (SUTURE) IMPLANT
SUT PROLENE 4 0 P 3 18 (SUTURE) ×2 IMPLANT
SUT SILK 2 0 FS (SUTURE) IMPLANT
SUT SILK 4 0 PS 2 (SUTURE) IMPLANT
SUT VIC AB 4-0 P-3 18XBRD (SUTURE) ×1 IMPLANT
SUT VIC AB 4-0 P3 18 (SUTURE) ×1
SYR 3ML 23GX1 SAFETY (SYRINGE) IMPLANT
SYR BULB 3OZ (MISCELLANEOUS) IMPLANT
SYR CONTROL 10ML LL (SYRINGE) ×2 IMPLANT
TOWEL OR 17X24 6PK STRL BLUE (TOWEL DISPOSABLE) ×2 IMPLANT
TUBE CONNECTING 20X1/4 (TUBING) IMPLANT
UNDERPAD 30X30 INCONTINENT (UNDERPADS AND DIAPERS) ×2 IMPLANT

## 2013-01-02 NOTE — H&P (Addendum)
Leah Hayden is an 79 y.o. female.   Chief Complaint: Mass of right palm, foreign body in right index finger palmar skin, skin lesion of dorsal left wrist  / hand. HPI: Well known patient with above mentioned predicaments who requests biopsy of mass, removal of FB and biopsy of left hand skin lesion.  Past Medical History  Diagnosis Date  . Allergic rhinitis   . Asthma   . HX: breast cancer   . Hx of colonic polyps   . Diverticulosis of colon   . GERD (gastroesophageal reflux disease)   . Meniere's disease   . Osteoarthritis   . Sinus brady-tachy syndrome   . Atrial fibrillation     only takes meds as needed    Past Surgical History  Procedure Laterality Date  . Mastectomy  1984    left   . Mastectomy  1985    right  . Partial hysterectomy  1966    w/ bladder tack  . Correction hammer toe  1997    2nd to left toe  . Esophagogastroduodenoscopy  10/1999    inflammation only   . Toe surgery  2001/2002    2nd/3rd,left   . Rotator cuff repair  10/09    right   . Abdominal hysterectomy      Family History  Problem Relation Age of Onset  . Heart failure Mother   . Gout Mother   . Heart disease Mother     heart failure  . Arthritis Mother   . Heart failure Father   . Heart disease Father     heart failure  . Diabetes Maternal Grandmother   . Colon cancer Maternal Grandfather   . Cancer Maternal Grandfather     colon  . Arthritis Brother    Social History:  reports that she has never smoked. She has never used smokeless tobacco. She reports that she does not drink alcohol or use illicit drugs.  Allergies: No Known Allergies  No prescriptions prior to admission    No results found for this or any previous visit (from the past 48 hour(s)). No results found.  Review of Systems  Constitutional: Negative.   HENT: Negative.   Eyes: Negative.   Respiratory: Negative.   Cardiovascular: Negative.   Gastrointestinal: Negative.   Genitourinary: Negative.    Musculoskeletal:       Foreign body granuloma of right index finger, large lump in hypothenar region of right palm. Skin lesion dorsum of left hand.  Skin: Negative.   Neurological: Negative.   Endo/Heme/Allergies: Negative.   Psychiatric/Behavioral: Negative.     Height 5\' 8"  (1.727 m), weight 70.761 kg (156 lb). Physical Exam  Constitutional: She is oriented to person, place, and time. She appears well-developed and well-nourished.  HENT:  Head: Normocephalic and atraumatic.  Eyes: Conjunctivae and EOM are normal. Pupils are equal, round, and reactive to light.  Neck: Normal range of motion. Neck supple.  Cardiovascular: Normal rate, regular rhythm and normal heart sounds.   Respiratory: Effort normal and breath sounds normal.  GI: Soft. Bowel sounds are normal.  Musculoskeletal: She exhibits no edema and no tenderness.  Foreign body granuloma on volar aspect of right index finger.  Possible KA skin lesion dorsum of left wrist/ hand. Large non trans illuminating mass of right palm in hypothenar region.  Neurological: She is alert and oriented to person, place, and time. No cranial nerve deficit.  Skin: Skin is warm and dry.  Psychiatric: She has a normal mood and  affect. Her behavior is normal. Judgment and thought content normal.     Assessment/Plan Mass right palm, foreign body index and skin lesion left dorsal wrist Biopsy and excision as outlined.  Surgery plans detailed. Questions invited and answered.  Risks and benefits detailed.  Re examination in the holding area reveals involution of dorsal hand lesion on left.  We will defer biopsy at this time.  SYPHER JR,ROBERT V 01/02/2013, 6:28 AM

## 2013-01-02 NOTE — Anesthesia Preprocedure Evaluation (Addendum)
Anesthesia Evaluation   Patient awake    Reviewed: Allergy & Precautions, H&P , NPO status , Patient's Chart, lab work & pertinent test results, reviewed documented beta blocker date and time   History of Anesthesia Complications Negative for: history of anesthetic complications  Airway Mallampati: II TM Distance: >3 FB Neck ROM: Full    Dental  (+) Teeth Intact and Dental Advisory Given   Pulmonary asthma ,    Pulmonary exam normal       Cardiovascular + dysrhythmias Atrial Fibrillation     Neuro/Psych  Headaches, negative neurological ROS  negative psych ROS   GI/Hepatic Neg liver ROS, GERD-  Medicated,  Endo/Other  negative endocrine ROS  Renal/GU negative Renal ROS     Musculoskeletal   Abdominal   Peds  Hematology   Anesthesia Other Findings   Reproductive/Obstetrics                          Anesthesia Physical Anesthesia Plan  ASA: III  Anesthesia Plan: General   Post-op Pain Management:    Induction: Intravenous  Airway Management Planned: LMA  Additional Equipment:   Intra-op Plan:   Post-operative Plan: Extubation in OR  Informed Consent: I have reviewed the patients History and Physical, chart, labs and discussed the procedure including the risks, benefits and alternatives for the proposed anesthesia with the patient or authorized representative who has indicated his/her understanding and acceptance.   Dental advisory given  Plan Discussed with: CRNA, Anesthesiologist and Surgeon  Anesthesia Plan Comments:        Anesthesia Quick Evaluation

## 2013-01-02 NOTE — Op Note (Signed)
[id number] 

## 2013-01-02 NOTE — Transfer of Care (Signed)
Immediate Anesthesia Transfer of Care Note  Patient: Leah Hayden Vital  Procedure(s) Performed: Procedure(s): EXPLORATION and EXCISION  MASS RIGHT PALM (Right) FOREIGN BODY REMOVAL RIGHT INDEX FINGER (Right)  Patient Location: PACU  Anesthesia Type:General  Level of Consciousness: sedated and patient cooperative  Airway & Oxygen Therapy: Patient Spontanous Breathing and Patient connected to face mask oxygen  Post-op Assessment: Report given to PACU RN and Post -op Vital signs reviewed and stable  Post vital signs: Reviewed and stable  Complications: No apparent anesthesia complications

## 2013-01-02 NOTE — Anesthesia Procedure Notes (Signed)
Procedure Name: LMA Insertion Date/Time: 01/02/2013 8:39 AM Performed by: Gar Gibbon Pre-anesthesia Checklist: Patient identified, Emergency Drugs available, Suction available and Patient being monitored Patient Re-evaluated:Patient Re-evaluated prior to inductionOxygen Delivery Method: Circle System Utilized Preoxygenation: Pre-oxygenation with 100% oxygen Intubation Type: IV induction Ventilation: Mask ventilation without difficulty LMA: LMA inserted LMA Size: 4.0 Number of attempts: 1 Airway Equipment and Method: bite block Placement Confirmation: positive ETCO2 Tube secured with: Tape Dental Injury: Teeth and Oropharynx as per pre-operative assessment

## 2013-01-02 NOTE — Anesthesia Postprocedure Evaluation (Signed)
Anesthesia Post Note  Patient: Leah Hayden  Procedure(s) Performed: Procedure(s) (LRB): EXPLORATION and EXCISION  MASS RIGHT PALM (Right) FOREIGN BODY REMOVAL RIGHT INDEX FINGER (Right)  Anesthesia type: general  Patient location: PACU  Post pain: Pain level controlled  Post assessment: Patient's Cardiovascular Status Stable  Last Vitals:  Filed Vitals:   01/02/13 1000  BP: 149/89  Pulse: 57  Temp:   Resp: 12    Post vital signs: Reviewed and stable  Level of consciousness: sedated  Complications: No apparent anesthesia complications

## 2013-01-02 NOTE — Brief Op Note (Signed)
01/02/2013  9:17 AM  PATIENT:  Leah Hayden  79 y.o. female  PRE-OPERATIVE DIAGNOSIS:  MASS RIGHT PALM HYPOTHENAR EMINENCE, GLASS RIGHT INDEX FINGER  POST-OPERATIVE DIAGNOSIS:  MASS RIGHT PALM HYPOTHENAR EMINENCE, GLASS RIGHT INDEX FINGER  PROCEDURE:  Procedure(s): EXPLORATION and EXCISION  MASS RIGHT PALM (Right) FOREIGN BODY REMOVAL RIGHT INDEX FINGER (Right)  SURGEON:  Surgeon(s) and Role:    * Wyn Forster., MD - Primary  PHYSICIAN ASSISTANT:   ASSISTANTS: Surgical technician  ANESTHESIA:   general  EBL:     BLOOD ADMINISTERED:none  DRAINS: none   LOCAL MEDICATIONS USED:  LIDOCAINE   SPECIMEN:  No Specimen and Biopsy / Limited Resection  DISPOSITION OF SPECIMEN:  PATHOLOGY  COUNTS:  YES  TOURNIQUET:   Total Tourniquet Time Documented: Upper Arm (Right) - 25 minutes Total: Upper Arm (Right) - 25 minutes   DICTATION: .Other Dictation: Dictation Number [id number]  PLAN OF CARE: Discharge to home after PACU  PATIENT DISPOSITION:  PACU - hemodynamically stable.   Delay start of Pharmacological VTE agent (>24hrs) due to surgical blood loss or risk of bleeding: not applicable

## 2013-01-03 NOTE — Op Note (Signed)
NAME:  Leah Hayden, Leah Hayden                 ACCOUNT NO.:  [id number]  MEDICAL RECORD NO.:  [id number]  LOCATION:                                 FACILITY:  PHYSICIAN:  Katy Fitch. Sypher, M.D. DATE OF BIRTH:  [birth year]  DATE OF PROCEDURE:  01/02/2013 DATE OF DISCHARGE:                              OPERATIVE REPORT   PREOPERATIVE DIAGNOSES: 1. Probable glass foreign body, chronic right index finger palmar     aspect, just proximal to the PIP flexion crease. 2. Enlarging mass, hypothenar eminence, rule out lipoma versus     schwannoma versus soft tissue neoplasm.  POSTOPERATIVE DIAGNOSIS:  Probable glass foreign body, right index finger and probable schwannoma from hypothenar eminence.  PROCEDURE: 1. Resection of glass foreign body granuloma, right index finger with     primary closure. 2. Resection of Hayden mass, hypothenar eminence, likely  schwannoma on     a cutaneous nerve branch.  This measured more than 3 x 2 cm.  SURGEON:  Katy Fitch. Sypher, M.D.  ASSISTANT:  Surgical technician.  ANESTHESIA:  General by LMA.  SUPERVISING ANESTHESIOLOGIST:  Dr. Krista Blue.  INDICATIONS:  Leah Hayden is an 79 year old woman well acquainted with our practice.  We have treated her for rotator cuff impairment in the past.  She returned concerned about a chronic foreign body in her right index finger and during exam was noted to have a mass in her hypothenar eminence of her right hand.  We had a detailed discussion of each problem.  I have recommended proceeding with simple excision of the foreign body granuloma and exploration for glass fragments in the index finger.  Also recommended exploration of the enlarging mass in her hypothenar eminence.  The mass did not transilluminate.  She did not have any neurovascular abnormalities.  We postulated this could be a deep lipoma versus a schwannoma or a neoplasm that would be defined by excisional biopsy.  After informed consent, she was brought to the  operating room at this time.  Preoperatively, she was reminded of potential risks, benefits of the surgery.  We pointed out that we would follow the proper rules of incisional and/or excision biopsy.  There was a chance that she would require revision surgery or more radical surgery if this turned out to be a non-benign condition.  With respect to the glass foreign body,  she understands that foreign body  resection could be quite problematic.  We will make our best effort at removal of the foreign body.  There was a chance that she could have deeper foreign body material that in time would point out.  Questions were invited and answered in detail.  Preoperatively, she was interviewed by Dr. Krista Blue of Anesthesia.  He recommended general anesthesia by LMA technique.  Questions were invited and answered regarding the anesthesia choices.  DESCRIPTION OF PROCEDURE:  Leah Hayden was brought to room 1 of the [facility] and placed supine position on the operating table.  Following the induction of general anesthesia by LMA technique under Dr. Robina Ade direct supervision, the right hand and arm were prepped with Betadine soap and solution, sterilely draped with sterile stockinette and impervious arthroscopy  drapes.  Following exsanguination of the right arm with an Esmarch bandage, the arterial tourniquet was inflated to 220 mmHg.  A routine surgical time-out was accomplished followed by an elliptical excision of the foreign body granuloma of the index finger, down to the level of subcutaneous fat. Care was taken to identify the position of the radial neurovascular bundle and the ulnar neurovascular bundle.  A Henner elevator was used to palpate the soft tissues.  I could feel the click of a foreign body.  Therefore, deeper soft tissue biopsy was accomplished.  After further fat and granuloma was removed, we appeared to have all of the palpable and visible foreign body material  removed. The wound was then repaired with intradermal 4-0 Prolene and Steri- Strips.  Attention was then directed to the hypothenar eminence.  The mass which measured approximately 4 cm clinically x 2 cm was defined and an incision created paralleling the thenar crease, ulnar to the thenar crease was created.  Subcutaneous tissues were carefully divided taking care to identify the palmaris brevis.  The palmaris brevis was released along its radial border followed by identification of the ulnar artery and nerve branches.  A firm and capsulated gray yellow mass was identified that was moderately hypervascular.  This appeared to be a schwannoma that was emanating from the cutaneous nerve branches.  This was circumferentially dissected taking care to protect the ulnar artery and the motor branch, and named sensory branches of the ulnar nerve. After this was fully excised feeding vessels were electrocauterized with bipolar current.  It is likely that Leah Hayden will have a very small area of numbness in her palm with consequence of schwannoma resection as we likely sacrificed a very small cutaneous branch with this lesion.  The wound was inspected for bleeding points.  The palmar fascia was released in the line of the ring finger due to the early formation of Dupuytren's palmar fibromatosis noted.  The wound was then repaired in layers with subcutaneous 4-0 Vicryl and interrupted sutures, knots buried, and segmental intradermal 4-0 Prolene with Steri-Strips.  Both wounds were infiltrated with 2% lidocaine for postoperative analgesia.  A compressive dressing was applied with sterile gauze, sterile Webril, and a volar plaster splint protecting the hypothenar eminence and supporting the wrist at 15 degrees of dorsiflexion.  There were no apparent complications.  For aftercare, Leah Hayden will be provided a prescription for Percocet for pain.  Questions regarding her surgery and aftercare  will be discussed with her family in the postoperative period.     Katy Fitch Sypher, M.D.     RVS/MEDQ  D:  01/02/2013  T:  01/03/2013  Job:  [id number]  cc:   Karie Schwalbe, MD

## 2013-01-17 ENCOUNTER — Telehealth: Payer: Self-pay

## 2013-01-17 NOTE — Telephone Encounter (Signed)
Rx written.

## 2013-01-17 NOTE — Telephone Encounter (Signed)
Order faxed.

## 2013-01-17 NOTE — Telephone Encounter (Signed)
Rosey Bath with [facility] left v/m requesting prescription for surgical bras and prosthesis faxed to [phone].

## 2013-05-05 ENCOUNTER — Encounter: Payer: Self-pay | Admitting: Internal Medicine

## 2013-05-05 ENCOUNTER — Ambulatory Visit (INDEPENDENT_AMBULATORY_CARE_PROVIDER_SITE_OTHER): Payer: Medicare Other | Admitting: Internal Medicine

## 2013-05-05 VITALS — BP 120/70 | HR 62 | Temp 98.1°F | Ht 68.0 in | Wt 155.0 lb

## 2013-05-05 DIAGNOSIS — I4891 Unspecified atrial fibrillation: Secondary | ICD-10-CM

## 2013-05-05 DIAGNOSIS — Z0000 Encounter for general adult medical examination without abnormal findings: Secondary | ICD-10-CM

## 2013-05-05 DIAGNOSIS — J45909 Unspecified asthma, uncomplicated: Secondary | ICD-10-CM

## 2013-05-05 LAB — BASIC METABOLIC PANEL
BUN: 18 mg/dL (ref 6–23)
CO2: 27 mEq/L (ref 19–32)
Calcium: 9.2 mg/dL (ref 8.4–10.5)
Chloride: 105 mEq/L (ref 96–112)
Creatinine, Ser: 0.6 mg/dL (ref 0.4–1.2)
GFR: 97.64 mL/min (ref >60.00)
Glucose, Bld: 95 mg/dL (ref 70–99)
Potassium: 4.2 mEq/L (ref 3.5–5.1)
Sodium: 137 mEq/L (ref 135–145)

## 2013-05-05 LAB — CBC WITH DIFFERENTIAL/PLATELET
Basophils Absolute: 0 10*3/uL (ref 0.0–0.1)
Basophils Relative: 0.3 % (ref 0.0–3.0)
Eosinophils Absolute: 0.1 10*3/uL (ref 0.0–0.7)
Eosinophils Relative: 1.6 % (ref 0.0–5.0)
HCT: 39.7 % (ref 36.0–46.0)
Hemoglobin: 13.4 g/dL (ref 12.0–15.0)
Lymphocytes Relative: 26 % (ref 12.0–46.0)
Lymphs Abs: 1.6 10*3/uL (ref 0.7–4.0)
MCHC: 33.7 g/dL (ref 30.0–36.0)
MCV: 92.5 fl (ref 78.0–100.0)
Monocytes Absolute: 0.4 10*3/uL (ref 0.1–1.0)
Monocytes Relative: 6.7 % (ref 3.0–12.0)
Neutro Abs: 4 10*3/uL (ref 1.4–7.7)
Neutrophils Relative %: 65.4 % (ref 43.0–77.0)
Platelets: 202 10*3/uL (ref 150.0–400.0)
RBC: 4.3 Mil/uL (ref 3.87–5.11)
RDW: 13.6 % (ref 11.5–14.6)
WBC: 6.1 10*3/uL (ref 4.5–10.5)

## 2013-05-05 LAB — HEPATIC FUNCTION PANEL
ALT: 13 U/L (ref 0–35)
AST: 16 U/L (ref 0–37)
Albumin: 3.9 g/dL (ref 3.5–5.2)
Alkaline Phosphatase: 58 U/L (ref 39–117)
Bilirubin, Direct: 0 mg/dL (ref 0.0–0.3)
Total Bilirubin: 1 mg/dL (ref 0.3–1.2)
Total Protein: 6.7 g/dL (ref 6.0–8.3)

## 2013-05-05 LAB — TSH: TSH: 0.5 u[IU]/mL (ref 0.35–5.50)

## 2013-05-05 LAB — T4, FREE: Free T4: 1.02 ng/dL (ref 0.60–1.60)

## 2013-05-05 NOTE — Assessment & Plan Note (Signed)
Generally healthy No cancer screening due to age (and had bilateral mastectomy) Will get flu shot at work

## 2013-05-05 NOTE — Assessment & Plan Note (Signed)
Rare symptoms On ASA Uses the propranolol and amiodarone only every few months

## 2013-05-05 NOTE — Progress Notes (Signed)
Subjective:    Patient ID: Leah Hayden, female    DOB: 1927/10/12, 80 y.o.   MRN: [id number]  HPI Here for physical Still works full time at [employer] fall--no injury No depression or anhedonia Reviewed advanced directives  Has not had trouble with her heart Has used the amiodarone/propranolol only 2-3 times total  Had retained glass reoved from hand in the spring Also had schwannoma removed  Current Outpatient Prescriptions on File Prior to Visit  Medication Sig Dispense Refill  . acetaminophen (TYLENOL) 325 MG tablet Take 650 mg by mouth every 6 (six) hours as needed.        Marland Kitchen albuterol (PROVENTIL HFA) 108 (90 BASE) MCG/ACT inhaler Inhale 2 puffs into the lungs every 6 (six) hours as needed.        . Ascorbic Acid (VITAMIN C) 1000 MG tablet Take 1,000 mg by mouth daily.      Marland Kitchen aspirin 81 MG tablet 81 mg.       . calcium-vitamin D (OSCAL) 250-125 MG-UNIT per tablet Take 1 tablet by mouth daily.        Tery Sanfilippo Sodium (STOOL SOFTENER) 100 MG capsule Take 100 mg by mouth 2 (two) times daily.        . Fluticasone-Salmeterol (ADVAIR DISKUS) 100-50 MCG/DOSE AEPB Inhale 1 puff into the lungs every 12 (twelve) hours.        . mometasone (NASONEX) 50 MCG/ACT nasal spray 2 sprays by Nasal route daily.        . montelukast (SINGULAIR) 10 MG tablet Take 10 mg by mouth daily.        . Multiple Vitamin (MULTIVITAMIN) tablet Take 1 tablet by mouth daily.        . propranolol (INDERAL) 10 MG tablet Take 1 tablet (10 mg total) by mouth 4 (four) times daily as needed.  120 tablet  3  . senna (SENOKOT) 8.6 MG tablet Take 1 tablet by mouth as needed.        No current facility-administered medications on file prior to visit.    No Known Allergies  Past Medical History  Diagnosis Date  . Allergic rhinitis   . Asthma   . HX: breast cancer   . Hx of colonic polyps   . Diverticulosis of colon   . GERD (gastroesophageal reflux disease)   . Meniere's disease   . Osteoarthritis    . Sinus brady-tachy syndrome   . Atrial fibrillation     only takes meds as needed    Past Surgical History  Procedure Laterality Date  . Mastectomy  1984    left   . Mastectomy  1985    right  . Partial hysterectomy  1966    w/ bladder tack  . Correction hammer toe  1997    2nd to left toe  . Esophagogastroduodenoscopy  10/1999    inflammation only   . Toe surgery  2001/2002    2nd/3rd,left   . Rotator cuff repair  10/09    right   . Abdominal hysterectomy    . Foreign body removal Right 01/02/2013    Procedure: FOREIGN BODY REMOVAL RIGHT INDEX FINGER;  Surgeon: Wyn Forster., MD;  Location: [facility];  Service: Orthopedics;  Laterality: Right;    Family History  Problem Relation Age of Onset  . Heart failure Mother   . Gout Mother   . Heart disease Mother     heart failure  . Arthritis Mother   .  Heart failure Father   . Heart disease Father     heart failure  . Diabetes Maternal Grandmother   . Colon cancer Maternal Grandfather   . Cancer Maternal Grandfather     colon  . Arthritis Brother     History   Social History  . Marital Status: Widowed    Spouse Name: N/A    Number of Children: 2  . Years of Education: N/A   Occupational History  . Retired     Research officer, trade union   Social History Main Topics  . Smoking status: Never Smoker   . Smokeless tobacco: Never Used  . Alcohol Use: No  . Drug Use: No  . Sexual Activity: Not on file   Other Topics Concern  . Not on file   Social History Narrative   No living will   No health care POA but requests both sons to be health care POAs   Has DNR   No tube feeds if cognitively unaware   Review of Systems  Constitutional: Negative for fatigue and unexpected weight change.       Always has weight go down in summer Wears seat belt  HENT: Positive for hearing loss, congestion and rhinorrhea. Negative for dental problem.        Gets mild symptoms with weather changes   Eyes: Negative for visual disturbance.       No diplopia or unilateral vision loss Had other cataract done in winter  Respiratory: Negative for cough, chest tightness and shortness of breath.        Asthma quiet on singulair and advair  Cardiovascular: Positive for palpitations and leg swelling. Negative for chest pain.       Mild edema--not worrisome  Gastrointestinal: Positive for constipation. Negative for nausea and blood in stool.       Uses stool softener and occasional laxative No heartburn  Endocrine: Positive for cold intolerance. Negative for heat intolerance.  Genitourinary: Negative for frequency and difficulty urinating.  Musculoskeletal: Negative for back pain, joint swelling and arthralgias.       Back gets "tired" by mid afternoon--okay with repositioning  Skin: Negative for rash.       Fever blister again--wants more Rx Will consider valacyclovir if recurs Slight pimples and itching on calves---OTC cortisone helps  Allergic/Immunologic: Positive for environmental allergies. Negative for immunocompromised state.  Neurological: Negative for dizziness, syncope, weakness, light-headedness, numbness and headaches.  Hematological: Negative for adenopathy. Bruises/bleeds easily.  Psychiatric/Behavioral: Negative for sleep disturbance and dysphoric mood. The patient is not nervous/anxious.        Mild memory issues---forgets where she put things       Objective:   Physical Exam  Constitutional: She is oriented to person, place, and time. She appears well-developed and well-nourished. No distress.  HENT:  Head: Normocephalic and atraumatic.  Right Ear: External ear normal.  Left Ear: External ear normal.  Mouth/Throat: Oropharynx is clear and moist. No oropharyngeal exudate.  Eyes: Conjunctivae and EOM are normal. Pupils are equal, round, and reactive to light.  Neck: Normal range of motion. Neck supple. No thyromegaly present.  Cardiovascular: Normal rate, regular  rhythm, normal heart sounds and intact distal pulses.  Exam reveals no gallop.   No murmur heard. Pulmonary/Chest: Effort normal and breath sounds normal. No respiratory distress. She has no wheezes. She has no rales.  Abdominal: Soft. There is no tenderness.  Musculoskeletal: She exhibits no edema and no tenderness.  Lymphadenopathy:    She has no  cervical adenopathy.  Neurological: She is alert and oriented to person, place, and time.  Skin: No rash noted. No erythema.  Psychiatric: She has a normal mood and affect. Her behavior is normal.          Assessment & Plan:

## 2013-05-05 NOTE — Assessment & Plan Note (Signed)
Controlled Sees Dr Atmautluak Callas

## 2013-05-06 ENCOUNTER — Encounter: Payer: Self-pay | Admitting: *Deleted

## 2013-11-12 ENCOUNTER — Encounter: Payer: Self-pay | Admitting: Cardiovascular Disease

## 2013-11-12 ENCOUNTER — Ambulatory Visit (INDEPENDENT_AMBULATORY_CARE_PROVIDER_SITE_OTHER): Payer: Medicare Other | Admitting: Cardiovascular Disease

## 2013-11-12 VITALS — BP 142/80 | HR 58 | Ht 68.0 in | Wt 148.8 lb

## 2013-11-12 DIAGNOSIS — S43499A Other sprain of unspecified shoulder joint, initial encounter: Secondary | ICD-10-CM

## 2013-11-12 DIAGNOSIS — I4891 Unspecified atrial fibrillation: Secondary | ICD-10-CM

## 2013-11-12 DIAGNOSIS — S46219A Strain of muscle, fascia and tendon of other parts of biceps, unspecified arm, initial encounter: Secondary | ICD-10-CM

## 2013-11-12 DIAGNOSIS — S46819A Strain of other muscles, fascia and tendons at shoulder and upper arm level, unspecified arm, initial encounter: Secondary | ICD-10-CM

## 2013-11-12 DIAGNOSIS — I471 Supraventricular tachycardia: Secondary | ICD-10-CM

## 2013-11-12 HISTORY — PX: CARDIAC CATHETERIZATION: SHX172

## 2013-11-12 NOTE — Patient Instructions (Signed)
You are doing well. No medication changes were made.  Please call us if you have new issues that need to be addressed before your next appt.  Your physician wants you to follow-up in: 6 months.  You will receive a reminder letter in the mail two months in advance. If you don't receive a letter, please call our office to schedule the follow-up appointment.   

## 2013-11-12 NOTE — Assessment & Plan Note (Addendum)
Injury in late 2014, seen by specialist in [facility]

## 2013-11-12 NOTE — Progress Notes (Signed)
Patient ID: Leah Hayden, female    DOB: 1928/05/04, 80 y.o.   MRN: [id number]  HPI Comments: Leah Hayden is a very pleasant 80 year old woman, patient of Dr. [name], history of SVT as well as pauses on Holter, admitted to the hospital August 2012 with atrial fibrillation and converted shortly after to normal sinus rhythm. At least one additional episode of atrial fibrillation since then.  In followup today, she reports that she has been doing well from a cardiac perspective. She reports having a torn biceps of her right arm at the end of September 2014. She wore a splint for 6 weeks. Seen by Dr. Daylene Katayama in [place]. Now doing rehabilitation, lifting only very light weights. She denies any significant arrhythmia. She's not had to take any amiodarone or propranolol.Otherwise she is doing well with no complaints. She is relatively active, balance is stable  EKG shows normal sinus rhythm with rate 58 beats per minute with no significant ST or T wave changes     Outpatient Encounter Prescriptions as of 11/12/2013  Medication Sig  . acetaminophen (TYLENOL) 325 MG tablet Take 650 mg by mouth every 6 (six) hours as needed.    Marland Kitchen albuterol (PROVENTIL HFA) 108 (90 BASE) MCG/ACT inhaler Inhale 2 puffs into the lungs every 6 (six) hours as needed.    Marland Kitchen amiodarone (PACERONE) 400 MG tablet Take 400 mg by mouth daily as needed.  . Ascorbic Acid (VITAMIN C) 1000 MG tablet Take 1,000 mg by mouth daily.  Marland Kitchen aspirin 81 MG tablet 81 mg.   . calcium-vitamin D (OSCAL) 250-125 MG-UNIT per tablet Take 1 tablet by mouth daily.    [name] Sodium (STOOL SOFTENER) 100 MG capsule Take 100 mg by mouth 2 (two) times daily.    . Fluticasone-Salmeterol (ADVAIR DISKUS) 100-50 MCG/DOSE AEPB Inhale 1 puff into the lungs every 12 (twelve) hours.    . mometasone (NASONEX) 50 MCG/ACT nasal spray 2 sprays by Nasal route daily.    . montelukast (SINGULAIR) 10 MG tablet Take 10 mg by mouth daily.    . Multiple Vitamin  (MULTIVITAMIN) tablet Take 1 tablet by mouth daily.    Marland Kitchen senna (SENOKOT) 8.6 MG tablet Take 1 tablet by mouth as needed.   . propranolol (INDERAL) 10 MG tablet Take 1 tablet (10 mg total) by mouth 4 (four) times daily as needed.    Review of Systems  Constitutional: Negative.   HENT: Negative.   Eyes: Negative.   Respiratory: Negative.   Cardiovascular: Negative.   Gastrointestinal: Negative.   Endocrine: Negative.   Musculoskeletal: Negative.   Skin: Negative.   Allergic/Immunologic: Negative.   Neurological: Negative.   Hematological: Negative.   Psychiatric/Behavioral: Negative.   All other systems reviewed and are negative.    BP 142/80  Pulse 58  Ht 5\' 8"  (1.727 m)  Wt 148 lb 12 oz (67.473 kg)  BMI 22.62 kg/m2  Physical Exam  Nursing note and vitals reviewed. Constitutional: She is oriented to person, place, and time. She appears well-developed and well-nourished.  HENT:  Head: Normocephalic.  Nose: Nose normal.  Mouth/Throat: Oropharynx is clear and moist.  Eyes: Conjunctivae are normal. Pupils are equal, round, and reactive to light.  Neck: Normal range of motion. Neck supple. No JVD present.  Cardiovascular: Normal rate, regular rhythm, S1 normal, S2 normal, normal heart sounds and intact distal pulses.  Exam reveals no gallop and no friction rub.   No murmur heard. Pulmonary/Chest: Effort normal and breath sounds normal. No respiratory  distress. She has no wheezes. She has no rales. She exhibits no tenderness.  Abdominal: Soft. Bowel sounds are normal. She exhibits no distension. There is no tenderness.  Musculoskeletal: Normal range of motion. She exhibits no edema and no tenderness.  Lymphadenopathy:    She has no cervical adenopathy.  Neurological: She is alert and oriented to person, place, and time. Coordination normal.  Skin: Skin is warm and dry. No rash noted. No erythema.  Psychiatric: She has a normal mood and affect. Her behavior is normal. Judgment  and thought content normal.    Assessment and Plan

## 2013-11-12 NOTE — Assessment & Plan Note (Signed)
She denies any tachycardia concerning for SVT. No near syncope or syncope. She'll continue to take amiodarone and propranolol as needed

## 2013-11-12 NOTE — Assessment & Plan Note (Signed)
No recent episodes of arrhythmia that she can appreciate. No further medication changes at this time

## 2013-11-27 ENCOUNTER — Ambulatory Visit: Payer: Medicare Other | Admitting: Nurse Practitioner

## 2013-11-27 ENCOUNTER — Inpatient Hospital Stay: Payer: Self-pay | Admitting: Internal Medicine

## 2013-11-27 ENCOUNTER — Telehealth: Payer: Self-pay

## 2013-11-27 DIAGNOSIS — I214 Non-ST elevation (NSTEMI) myocardial infarction: Secondary | ICD-10-CM

## 2013-11-27 DIAGNOSIS — I4891 Unspecified atrial fibrillation: Secondary | ICD-10-CM

## 2013-11-27 LAB — COMPREHENSIVE METABOLIC PANEL
Albumin: 3.5 g/dL (ref 3.4–5.0)
Alkaline Phosphatase: 81 U/L
Anion Gap: 3 — ABNORMAL LOW (ref 7–16)
BUN: 23 mg/dL — ABNORMAL HIGH (ref 7–18)
Bilirubin,Total: 0.8 mg/dL (ref 0.2–1.0)
Calcium, Total: 8.8 mg/dL (ref 8.5–10.1)
Chloride: 110 mmol/L — ABNORMAL HIGH (ref 98–107)
Co2: 29 mmol/L (ref 21–32)
Creatinine: 0.6 mg/dL (ref 0.60–1.30)
EGFR (African American): >60
EGFR (Non-African Amer.): >60
Glucose: 117 mg/dL — ABNORMAL HIGH (ref 65–99)
Osmolality: 288 (ref 275–301)
Potassium: 4.4 mmol/L (ref 3.5–5.1)
SGOT(AST): 22 U/L (ref 15–37)
SGPT (ALT): 22 U/L (ref 12–78)
Sodium: 142 mmol/L (ref 136–145)
Total Protein: 7.1 g/dL (ref 6.4–8.2)

## 2013-11-27 LAB — PROTIME-INR
INR: 1
Prothrombin Time: 12.6 secs (ref 11.5–14.7)

## 2013-11-27 LAB — TROPONIN I
Troponin-I: 0.15 ng/mL — ABNORMAL HIGH
Troponin-I: 0.26 ng/mL — ABNORMAL HIGH
Troponin-I: 0.27 ng/mL — ABNORMAL HIGH

## 2013-11-27 LAB — TSH: Thyroid Stimulating Horm: 1.45 u[IU]/mL

## 2013-11-27 LAB — URINALYSIS, COMPLETE
Bacteria: NONE SEEN
Bilirubin,UR: NEGATIVE
Glucose,UR: NEGATIVE mg/dL (ref 0–75)
Nitrite: NEGATIVE
Ph: 7 (ref 4.5–8.0)
Protein: NEGATIVE
RBC,UR: 1 /HPF (ref 0–5)
Specific Gravity: 1.016 (ref 1.003–1.030)
Squamous Epithelial: 1
WBC UR: 1 /HPF (ref 0–5)

## 2013-11-27 LAB — CBC
HCT: 44.9 % (ref 35.0–47.0)
HGB: 14.6 g/dL (ref 12.0–16.0)
MCH: 30.5 pg (ref 26.0–34.0)
MCHC: 32.5 g/dL (ref 32.0–36.0)
MCV: 94 fL (ref 80–100)
Platelet: 201 10*3/uL (ref 150–440)
RBC: 4.78 10*6/uL (ref 3.80–5.20)
RDW: 13.1 % (ref 11.5–14.5)
WBC: 5.9 10*3/uL (ref 3.6–11.0)

## 2013-11-27 LAB — MAGNESIUM: Magnesium: 2.1 mg/dL

## 2013-11-27 LAB — APTT: Activated PTT: 28.1 secs (ref 23.6–35.9)

## 2013-11-27 NOTE — Telephone Encounter (Signed)
Pt called stating that she was in afib all night and is very weak.   She reports that she feels that she converted back to NSR this am, but she feels awful. Offered pt to see Ignacia Bayley, NP this afternoon at 3:15, as Dr. Rockey Situ does not have an available time.  She is agreeable to this and will try to get some rest before that time.   Pt's daughter-in-law called stating that she is concerned about pt's weakness and feels that she needs to be seen sooner than 3:15. Advised her that Dr. Rockey Situ does not have any appt times available today.  Spoke w/ office manager and opened up 1:15 w/ Gerald Stabs this afternoon to offer to pt. She would like to take this appt time. In the meantime, daughter-in-law will contact her neighbor who is an EMT and see if they can come out and do an EKG on pt and take to ED if needed.  She states that at pt's last ov on 11/12/13, pt brought in her meds and gave our office the meds that were past their due date, but she did not get new meds to replace these.  Pt has not taken her prn propanolol or amiodarone in quite some time and did not think that she would need them anymore, so pt did not have these available in the night.  Pt sched to see Ignacia Bayley, NP today at 1:15. Advised her that I would make Dr. Rockey Situ aware of her situation.

## 2013-11-28 ENCOUNTER — Telehealth: Payer: Self-pay | Admitting: *Deleted

## 2013-11-28 ENCOUNTER — Encounter: Payer: Self-pay | Admitting: Cardiovascular Disease

## 2013-11-28 DIAGNOSIS — I059 Rheumatic mitral valve disease, unspecified: Secondary | ICD-10-CM

## 2013-11-28 DIAGNOSIS — I214 Non-ST elevation (NSTEMI) myocardial infarction: Secondary | ICD-10-CM

## 2013-11-28 DIAGNOSIS — I4891 Unspecified atrial fibrillation: Secondary | ICD-10-CM

## 2013-11-28 LAB — HEMOGLOBIN A1C: Hemoglobin A1C: 5.9 % (ref 4.2–6.3)

## 2013-11-28 LAB — BASIC METABOLIC PANEL
Anion Gap: 2 — ABNORMAL LOW (ref 7–16)
BUN: 18 mg/dL (ref 7–18)
Calcium, Total: 8.3 mg/dL — ABNORMAL LOW (ref 8.5–10.1)
Chloride: 112 mmol/L — ABNORMAL HIGH (ref 98–107)
Co2: 28 mmol/L (ref 21–32)
Creatinine: 0.68 mg/dL (ref 0.60–1.30)
EGFR (African American): >60
EGFR (Non-African Amer.): >60
Glucose: 126 mg/dL — ABNORMAL HIGH (ref 65–99)
Osmolality: 287 (ref 275–301)
Potassium: 4.1 mmol/L (ref 3.5–5.1)
Sodium: 142 mmol/L (ref 136–145)

## 2013-11-28 LAB — CBC WITH DIFFERENTIAL/PLATELET
Basophil #: 0 10*3/uL (ref 0.0–0.1)
Basophil %: 0.3 %
Eosinophil #: 0.1 10*3/uL (ref 0.0–0.7)
Eosinophil %: 1.9 %
HCT: 36.9 % (ref 35.0–47.0)
HGB: 12.2 g/dL (ref 12.0–16.0)
Lymphocyte #: 1.5 10*3/uL (ref 1.0–3.6)
Lymphocyte %: 19.8 %
MCH: 30.9 pg (ref 26.0–34.0)
MCHC: 33.1 g/dL (ref 32.0–36.0)
MCV: 93 fL (ref 80–100)
Monocyte #: 0.5 x10 3/mm (ref 0.2–0.9)
Monocyte %: 6.2 %
Neutrophil #: 5.4 10*3/uL (ref 1.4–6.5)
Neutrophil %: 71.8 %
Platelet: 177 10*3/uL (ref 150–440)
RBC: 3.95 10*6/uL (ref 3.80–5.20)
RDW: 13.3 % (ref 11.5–14.5)
WBC: 7.5 10*3/uL (ref 3.6–11.0)

## 2013-11-28 NOTE — Telephone Encounter (Signed)
Spoke w/ [name].  Advised her that I am leaving samples up front of Eliquis for her to pick up.

## 2013-11-28 NOTE — Telephone Encounter (Signed)
Patient wants samples of Eloquis.

## 2013-11-28 NOTE — Telephone Encounter (Signed)
Can you review for samples?

## 2013-11-29 LAB — CBC WITH DIFFERENTIAL/PLATELET
Basophil #: 0 10*3/uL (ref 0.0–0.1)
Basophil %: 0.4 %
Eosinophil #: 0.2 10*3/uL (ref 0.0–0.7)
Eosinophil %: 2.8 %
HCT: 36 % (ref 35.0–47.0)
HGB: 12 g/dL (ref 12.0–16.0)
Lymphocyte #: 1.5 10*3/uL (ref 1.0–3.6)
Lymphocyte %: 24.6 %
MCH: 31 pg (ref 26.0–34.0)
MCHC: 33.4 g/dL (ref 32.0–36.0)
MCV: 93 fL (ref 80–100)
Monocyte #: 0.5 x10 3/mm (ref 0.2–0.9)
Monocyte %: 8.6 %
Neutrophil #: 3.9 10*3/uL (ref 1.4–6.5)
Neutrophil %: 63.6 %
Platelet: 156 10*3/uL (ref 150–440)
RBC: 3.87 10*6/uL (ref 3.80–5.20)
RDW: 13.4 % (ref 11.5–14.5)
WBC: 6.1 10*3/uL (ref 3.6–11.0)

## 2013-12-01 ENCOUNTER — Telehealth: Payer: Self-pay | Admitting: *Deleted

## 2013-12-01 ENCOUNTER — Encounter: Payer: Self-pay | Admitting: *Deleted

## 2013-12-01 NOTE — Telephone Encounter (Signed)
Patient contacted regarding discharge from [facility] on 11/29/13.  Patient understands to follow up with provider Ignacia Bayley on 12/03/13 at 1:45 pm at [facility]. Patient understands discharge instructions? yes Patient understands medications and regiment? yes Patient understands to bring all medications to this visit? yes

## 2013-12-02 ENCOUNTER — Ambulatory Visit (INDEPENDENT_AMBULATORY_CARE_PROVIDER_SITE_OTHER): Payer: Medicare Other | Admitting: Internal Medicine

## 2013-12-02 ENCOUNTER — Encounter: Payer: Self-pay | Admitting: Internal Medicine

## 2013-12-02 VITALS — BP 128/70 | HR 59 | Temp 98.6°F | Wt 147.0 lb

## 2013-12-02 DIAGNOSIS — S46819A Strain of other muscles, fascia and tendons at shoulder and upper arm level, unspecified arm, initial encounter: Secondary | ICD-10-CM

## 2013-12-02 DIAGNOSIS — S43499A Other sprain of unspecified shoulder joint, initial encounter: Secondary | ICD-10-CM

## 2013-12-02 DIAGNOSIS — S46219A Strain of muscle, fascia and tendon of other parts of biceps, unspecified arm, initial encounter: Secondary | ICD-10-CM

## 2013-12-02 DIAGNOSIS — I4891 Unspecified atrial fibrillation: Secondary | ICD-10-CM

## 2013-12-02 NOTE — Assessment & Plan Note (Signed)
Improving Hopes to go back to work next month

## 2013-12-02 NOTE — Progress Notes (Signed)
Subjective:    Patient ID: Leah Hayden, female    DOB: 11-14-1927, 80 y.o.   MRN: [id number]  HPI Here with DIL -- [name] Trimble  Hospitalized last week for rapid atrial fib Always happens at night Didn't feel good--then palpitations No chest pain but had burning from throat to stomach (and some nausea) Didn't seek help till AM Did convert after cardizem  Echo looked okay despite some bump in enzymes Had cath which showed basically normal coronaries. Normal EF on both tests Brief Rx with beta blocker but that was stopped before discharge Statin also started-- due to possible non Q MI (but not seen on cath)  Since home, no chest pain or SOB Feels weak--- but staying by herself  Tore right biceps in December--- seen by Dr Cypher Splint then therapy  Home BP 106/61- 161/74. Most under 353 systolic though  Has lump in groin on right side--at cath site  Current Outpatient Prescriptions on File Prior to Visit  Medication Sig Dispense Refill  . acetaminophen (TYLENOL) 325 MG tablet Take 650 mg by mouth every 6 (six) hours as needed.        Marland Kitchen albuterol (PROVENTIL HFA) 108 (90 BASE) MCG/ACT inhaler Inhale 2 puffs into the lungs every 6 (six) hours as needed.        Marland Kitchen amiodarone (PACERONE) 200 MG tablet Take 200 mg by mouth daily.      Marland Kitchen apixaban (ELIQUIS) 5 MG TABS tablet Take 5 mg by mouth 2 (two) times daily.      . Ascorbic Acid (VITAMIN C) 1000 MG tablet Take 1,000 mg by mouth daily.      Marland Kitchen atorvastatin (LIPITOR) 80 MG tablet Take 80 mg by mouth daily.      . calcium-vitamin D (OSCAL) 250-125 MG-UNIT per tablet Take 1 tablet by mouth daily.        [name] Sodium (STOOL SOFTENER) 100 MG capsule Take 100 mg by mouth 2 (two) times daily.        . Fluticasone-Salmeterol (ADVAIR DISKUS) 100-50 MCG/DOSE AEPB Inhale 1 puff into the lungs every 12 (twelve) hours.        . mometasone (NASONEX) 50 MCG/ACT nasal spray 2 sprays by Nasal route daily.        . montelukast (SINGULAIR) 10 MG  tablet Take 10 mg by mouth daily.        . Multiple Vitamin (MULTIVITAMIN) tablet Take 1 tablet by mouth daily.        . propranolol (INDERAL) 10 MG tablet Take 1 tablet (10 mg total) by mouth 4 (four) times daily as needed.  120 tablet  3  . senna (SENOKOT) 8.6 MG tablet Take 1 tablet by mouth as needed.        No current facility-administered medications on file prior to visit.    No Known Allergies  Past Medical History  Diagnosis Date  . Allergic rhinitis   . Asthma   . HX: breast cancer   . Hx of colonic polyps   . Diverticulosis of colon   . GERD (gastroesophageal reflux disease)   . Meniere's disease   . Osteoarthritis   . Sinus brady-tachy syndrome   . Atrial fibrillation     only takes meds as needed  . Biceps tendon tear     right    Past Surgical History  Procedure Laterality Date  . Mastectomy  1984    left   . Mastectomy  1985    right  .  Partial hysterectomy  1966    w/ bladder tack  . Correction hammer toe  1997    2nd to left toe  . Esophagogastroduodenoscopy  10/1999    inflammation only   . Toe surgery  2001/2002    2nd/3rd,left   . Rotator cuff repair  10/09    right   . Abdominal hysterectomy    . Foreign body removal Right 01/02/2013    Procedure: FOREIGN BODY REMOVAL RIGHT INDEX FINGER;  Surgeon: Cammie Sickle., MD;  Location: [place];  Service: Orthopedics;  Laterality: Right;  . Cardiac catheterization  11/2013    [facility]    Family History  Problem Relation Age of Onset  . Heart failure Mother   . Gout Mother   . Heart disease Mother     heart failure  . Arthritis Mother   . Heart failure Father   . Heart disease Father     heart failure  . Diabetes Maternal Grandmother   . Colon cancer Maternal Grandfather   . Cancer Maternal Grandfather     colon  . Arthritis Brother     History   Social History  . Marital Status: Widowed    Spouse Name: N/A    Number of Children: 2  . Years of Education: N/A    Occupational History  . 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 Smoking status: Never Smoker   . Smokeless tobacco: Never Used  . Alcohol Use: No  . Drug Use: No  . Sexual Activity: Not on file   Other Topics Concern  . Not on file   Social History Narrative   No living will   No health care POA but requests both sons to be health care POAs   Has DNR   No tube feeds if cognitively unaware   Review of Systems Sleeps well Appetite is still off some Weight down since last visit-- ~10#    Objective:   Physical Exam  Constitutional: She appears well-developed and well-nourished. No distress.  Neck: Normal range of motion. Neck supple. No thyromegaly present.  Cardiovascular: Normal rate, regular rhythm and normal heart sounds.  Exam reveals no gallop.   No murmur heard. Abdominal:  Hematoma and bruising in right inguinal area. Slightly tender  Musculoskeletal: She exhibits no edema and no tenderness.  Lymphadenopathy:    She has no cervical adenopathy.  Psychiatric: She has a normal mood and affect. Her behavior is normal.          Assessment & Plan:

## 2013-12-02 NOTE — Assessment & Plan Note (Signed)
Recent paroxysm Converted Now on amiodarone and eliquis---at least for now Following with Dr Rockey Situ  ?demand ischemia Cath fine Now on statin---not sure that is needed---especially at such a high dose Will await cardiology reeval

## 2013-12-02 NOTE — Progress Notes (Signed)
Pre visit review using our clinic review tool, if applicable. No additional management support is needed unless otherwise documented below in the visit note. 

## 2013-12-03 ENCOUNTER — Ambulatory Visit (INDEPENDENT_AMBULATORY_CARE_PROVIDER_SITE_OTHER): Payer: Medicare Other | Admitting: Nurse Practitioner

## 2013-12-03 ENCOUNTER — Encounter: Payer: Self-pay | Admitting: Nurse Practitioner

## 2013-12-03 VITALS — BP 138/69 | HR 58 | Ht 68.0 in | Wt 147.0 lb

## 2013-12-03 DIAGNOSIS — I214 Non-ST elevation (NSTEMI) myocardial infarction: Secondary | ICD-10-CM | POA: Insufficient documentation

## 2013-12-03 DIAGNOSIS — E785 Hyperlipidemia, unspecified: Secondary | ICD-10-CM

## 2013-12-03 DIAGNOSIS — I4891 Unspecified atrial fibrillation: Secondary | ICD-10-CM

## 2013-12-03 DIAGNOSIS — I480 Paroxysmal atrial fibrillation: Secondary | ICD-10-CM

## 2013-12-03 MED ORDER — ATORVASTATIN CALCIUM 40 MG PO TABS: 40.0000 mg | ORAL_TABLET | Freq: Every day | ORAL | Status: DC

## 2013-12-03 NOTE — Patient Instructions (Signed)
Your physician has recommended you make the following change in your medication:  Decrease Atorvastatin to 40 mg daily   Your physician recommends that you schedule a follow-up appointment in:  6 weeks with Dr. Rockey Situ   Your physician recommends that you return for lab work in:  Fasting labs when you see Dr. Rockey Situ in 6 weeks

## 2013-12-03 NOTE — Progress Notes (Signed)
Patient Name: Leah Hayden Date of Encounter: 12/03/2013  Primary Care Provider:  Viviana Simpler, MD Primary Cardiologist:  Johnny Bridge, MD   Patient Profile  80 y/o female with h/o PAF who was admitted last week with AF RVR and small NSTEMI.  Problem List   Past Medical History  Diagnosis Date  . Allergic rhinitis   . Asthma   . HX: breast cancer   . Hx of colonic polyps   . Diverticulosis of colon   . GERD (gastroesophageal reflux disease)   . Meniere's disease   . Osteoarthritis   . Sinus brady-tachy syndrome     a. Post-conversion pause of 7.2 seconds during 11/2012 hospitalization @ [facility].  Marland Kitchen PAF (paroxysmal atrial fibrillation)     a. Eliquis and amio initiated 11/2012 in setting of admission with RVR and NSTEMI  . Biceps tendon tear     a. right->s/p surgical repair winter of 2014.  . NSTEMI (non-ST elevated myocardial infarction)     a. 11/2012 in setting of rapid afib;  b. 11/2012 Cath: nl EF, nonobs CAD->Med Rx;  b. 11/2012 Echo: EF 60-65%, mild LVH, mild MR/TR.   Past Surgical History  Procedure Laterality Date  . Mastectomy  1984    left   . Mastectomy  1985    right  . Partial hysterectomy  1966    w/ bladder tack  . Correction hammer toe  1997    2nd to left toe  . Esophagogastroduodenoscopy  10/1999    inflammation only   . Toe surgery  2001/2002    2nd/3rd,left   . Rotator cuff repair  10/09    right   . Abdominal hysterectomy    . Foreign body removal Right 01/02/2013    Procedure: FOREIGN BODY REMOVAL RIGHT INDEX FINGER;  Surgeon: Cammie Sickle., MD;  Location: [place];  Service: Orthopedics;  Laterality: Right;  . Cardiac catheterization  11/2013    [facility]    Allergies  No Known Allergies  HPI  80 y/o female with a prior h/o PAF.  She had previously been managed with PRN amiodarone for symptomatic afib but Rx became out of date.  She was not previously on long term anticoagulation.  Last week, she awoke in the middle of the  night with recurrent tachypalps and chest burning, which was a new Ss for her.  Ss persisted throughout the night and she called EMS the following morning.  She was found to be in afib with rvr and was taken to [facility] for eval.  There, troponin was elevated.  Initial ECG showed subtle anterolateral ST elevation.  She was initially treated with IV dilt and oral BB with rate improvement and later conversion.  In the setting of NSTEMI, she underwent cath, which showed nonobs CAD and nl LV fxn. F/U echo corroborated nl LV fxn.  When she converted from afib to sinus, she had a 7.2 second pause, which was asymptomatic.  At that point, we opted to initiate daily antiarrhythmic therapy in order to prevent recurrent afib and subsequently recurrent post-termination pauses and NSTEMI's.  Eliquis was also started.  Since d/c, she has done well.  She denies palpitations, chest pain, dyspnea, pnd, orthopnea, n, v, dizziness, syncope, edema, or early satiety.  She has a knot @ the site of her cath (R Fem) but otherwise is doing well.  Home Medications  Prior to Admission medications   Medication Sig Start Date End Date Taking? Authorizing Provider  acetaminophen (TYLENOL) 325 MG tablet Take 650 mg by mouth every 6 (six) hours as needed.     Yes Historical Provider, MD  albuterol (PROVENTIL HFA) 108 (90 BASE) MCG/ACT inhaler Inhale 2 puffs into the lungs every 6 (six) hours as needed.     Yes Historical Provider, MD  amiodarone (PACERONE) 200 MG tablet Take 200 mg by mouth daily.   Yes Historical Provider, MD  apixaban (ELIQUIS) 5 MG TABS tablet Take 5 mg by mouth 2 (two) times daily.   Yes Historical Provider, MD  calcium-vitamin D (OSCAL) 250-125 MG-UNIT per tablet Take 1 tablet by mouth daily.     Yes Historical Provider, MD  Docusate Sodium (STOOL SOFTENER) 100 MG capsule Take 100 mg by mouth 2 (two) times daily.     Yes Historical Provider, MD  Fluticasone-Salmeterol (ADVAIR DISKUS) 100-50 MCG/DOSE AEPB Inhale 1 puff  into the lungs every 12 (twelve) hours.     Yes Historical Provider, MD  mometasone (NASONEX) 50 MCG/ACT nasal spray 2 sprays by Nasal route daily.     Yes Historical Provider, MD  montelukast (SINGULAIR) 10 MG tablet Take 10 mg by mouth daily.     Yes Historical Provider, MD  Multiple Vitamin (MULTIVITAMIN) tablet Take 1 tablet by mouth daily.     Yes Historical Provider, MD  senna (SENOKOT) 8.6 MG tablet Take 1 tablet by mouth as needed.    Yes Historical Provider, MD  atorvastatin (LIPITOR) 40 MG tablet Take 1 tablet (40 mg total) by mouth daily. 12/03/13   Rogelia Mire, NP    Review of Systems  As above, She denies chest pain, palpitations, dyspnea, pnd, orthopnea, n, v, dizziness, syncope, edema, weight gain, or early satiety. Her R groin is bruised and tender.  All other systems reviewed and are otherwise negative except as noted above.  Physical Exam  Blood pressure 138/69, pulse 58, height 5\' 8"  (1.727 m), weight 147 lb (66.679 kg).  General: Pleasant, NAD Psych: Normal affect. Neuro: Alert and oriented X 3. Moves all extremities spontaneously. HEENT: Normal  Neck: Supple without bruits or JVD. Lungs:  Resp regular and unlabored, CTA. Heart: RRR no s3, s4, 2/6 syst murmur along left sternal border. Abdomen: Soft, non-tender, non-distended, BS + x 4.  Extremities: No clubbing, cyanosis or edema. DP/PT/Radials 2+ and equal bilaterally.  R groin cath site notable for ecchymosis and small quarter sized hematoma with tenderness.  No bruit or bleeding.  Accessory Clinical Findings  ECG - sinus brady, 58, poor r prog, lad, lae - no acute st/t changes.  Assessment & Plan  1.  PAF:  S/p recent admission for afib and NSTEMI.  She has been placed on both amiodarone and eliquis (CHA2DS2VASc= 4) and is tolerating both.  ECG today shows sinus brady.  She has been following her HR/BP's @ home and HR's appear to be pretty consistently in the 50's.  Due to baseline bradycardia, she is not  on a BB.  Because she does have post-termination pauses, keeping her in sinus rhythm as we go forward will be important.  2.  NSTEMI, subsequent episode of care: Cath showed nonobs dzs, thus NSTEMI likely Type II in setting of rapid Afib.  Nl LV fxn by LV gram and echo.  No chest pain/"discomfort" since discharge.  She is not on ASA b/c she is on eliquis.  No BB 2/2 baseline bradycardia.  Because she did have some, though nonobs, CAD, we will keep her on statin therapy, but will reduce  dose to 40mg  daily.  She will need f/u lipids and lft's in 6 wks and we will have these drawn when she f/u with Dr. Rockey Situ in that time frame.  3.  HTN:  Stable.  4.  HL:  Statin therapy as above with plan for f/u in 6 wks.  5.  Dispo: f/u with Dr. Rockey Situ in 4-6 wks with Lipids/LFT's @ that time.   Rogelia Mire, NP 12/03/2013, 2:59 PM

## 2013-12-04 ENCOUNTER — Telehealth: Payer: Self-pay

## 2013-12-04 NOTE — Telephone Encounter (Signed)
Pt called and needs as note for work stating she can go back to work. Please call.

## 2013-12-24 ENCOUNTER — Other Ambulatory Visit: Payer: Self-pay | Admitting: *Deleted

## 2013-12-24 MED ORDER — AMIODARONE HCL 200 MG PO TABS: 200.0000 mg | ORAL_TABLET | Freq: Every day | ORAL | Status: DC

## 2013-12-25 ENCOUNTER — Telehealth: Payer: Self-pay

## 2013-12-25 NOTE — Telephone Encounter (Signed)
Pt called and states she needs a note stating she can be out of work until the end of May.

## 2013-12-25 NOTE — Telephone Encounter (Signed)
Spoke w/ pt.  Advised her that I am leaving a work excuse for her to pick up at the front desk excusing her from work for the rest of the month.

## 2013-12-26 ENCOUNTER — Telehealth: Payer: Self-pay | Admitting: *Deleted

## 2013-12-26 NOTE — Telephone Encounter (Signed)
Patient called and needs a letter stating its ok to work 6 hrs a day until the end May. Please fax to # [phone].

## 2013-12-26 NOTE — Telephone Encounter (Signed)
Spoke w/ pt.  She states that she would like an amended work note that states that her work hours be cut by 2 hrs each day. She would like to only work 6 hrs per day until the end of May.  Letter typed and faxed to [phone].

## 2013-12-28 DIAGNOSIS — I214 Non-ST elevation (NSTEMI) myocardial infarction: Secondary | ICD-10-CM

## 2013-12-28 DIAGNOSIS — E785 Hyperlipidemia, unspecified: Secondary | ICD-10-CM

## 2014-01-16 ENCOUNTER — Ambulatory Visit (INDEPENDENT_AMBULATORY_CARE_PROVIDER_SITE_OTHER): Payer: Medicare Other

## 2014-01-16 ENCOUNTER — Ambulatory Visit (INDEPENDENT_AMBULATORY_CARE_PROVIDER_SITE_OTHER): Payer: Medicare Other | Admitting: Cardiovascular Disease

## 2014-01-16 ENCOUNTER — Encounter: Payer: Self-pay | Admitting: Cardiovascular Disease

## 2014-01-16 VITALS — BP 140/72 | HR 52 | Ht 68.0 in | Wt 148.5 lb

## 2014-01-16 DIAGNOSIS — I4891 Unspecified atrial fibrillation: Secondary | ICD-10-CM

## 2014-01-16 DIAGNOSIS — E785 Hyperlipidemia, unspecified: Secondary | ICD-10-CM

## 2014-01-16 DIAGNOSIS — Z79899 Other long term (current) drug therapy: Secondary | ICD-10-CM

## 2014-01-16 NOTE — Assessment & Plan Note (Signed)
No further episodes  of atrial fibrillation. We will continue her on her current medications. we have suggested if no further episodes of atrial fibrillation in the next month, she could hold her anticoagulation. Would continue amiodarone 200 mg daily. Suggested she monitor her pulse. For any symptoms of fatigue, we have measured her pulse and call our office if this is running in the 40s

## 2014-01-16 NOTE — Patient Instructions (Signed)
You are doing well. No medication changes were made.  Please call us if you have new issues that need to be addressed before your next appt.  Your physician wants you to follow-up in: 6 months.  You will receive a reminder letter in the mail two months in advance. If you don't receive a letter, please call our office to schedule the follow-up appointment.   

## 2014-01-16 NOTE — Progress Notes (Signed)
Patient ID: Leah Hayden, female    DOB: 1927-09-04, 80 y.o.   MRN: [id number]  HPI Comments: Miss Digangi is a very pleasant 80 year old woman with a history of SVT as well as pauses on Holter, admitted to the hospital August 2012 with atrial fibrillation and converted shortly after to normal sinus rhythm. At least one additional episode of atrial fibrillation since then. History of torn biceps of her right arm at the end of September 2014. She wore a splint for 6 weeks. Seen by Dr. Daylene Katayama in [place].   In the middle of April 2015, she awoke in the middle of the night with recurrent tachypalps and chest burning.  she called EMS the following morning.  She was found to be in afib with rvr and was taken to [facility] for eval.  There, troponin was elevated.  Initial ECG showed subtle anterolateral ST elevation.  She was initially treated with IV dilt and oral BB with rate improvement and later conversion.  In the setting of NSTEMI, she underwent cath, which showed nonobs CAD and nl LV fxn. F/U echo corroborated nl LV fxn.    When she converted from afib to sinus, she had a 7.2 second pause, she was asymptomatic.  She was started on antiarrhythmic therapy in order to prevent recurrent afib and subsequently recurrent post-termination pauses. Eliquis was also started.   In followup, she reports doing well. She denies any symptoms of tachycardia or palpitations concerning for recurrent atrial fibrillation. She reports that she has gone back to work full time, currently working 6 hours per day, scheduled to start 8 hour per day shift in the near future. She needs to work for extra money, for company, to avoid being depressed at home.   EKG shows normal sinus rhythm with rate 52 beats per minute with no significant ST or T wave changes Heart rate is slower than before likely from amiodarone. She is asymptomatic     Outpatient Encounter Prescriptions as of 01/16/2014  Medication Sig  . acetaminophen  (TYLENOL) 325 MG tablet Take 650 mg by mouth every 6 (six) hours as needed.    Marland Kitchen albuterol (PROVENTIL HFA) 108 (90 BASE) MCG/ACT inhaler Inhale 2 puffs into the lungs every 6 (six) hours as needed.    Marland Kitchen amiodarone (PACERONE) 200 MG tablet Take 1 tablet (200 mg total) by mouth daily.  Marland Kitchen apixaban (ELIQUIS) 5 MG TABS tablet Take 5 mg by mouth 2 (two) times daily.  Marland Kitchen atorvastatin (LIPITOR) 40 MG tablet Take 1 tablet (40 mg total) by mouth daily.  . calcium-vitamin D (OSCAL) 250-125 MG-UNIT per tablet Take 1 tablet by mouth daily.    [name] Sodium (STOOL SOFTENER) 100 MG capsule Take 100 mg by mouth 2 (two) times daily.    . Fluticasone-Salmeterol (ADVAIR DISKUS) 100-50 MCG/DOSE AEPB Inhale 1 puff into the lungs every 12 (twelve) hours.    . mometasone (NASONEX) 50 MCG/ACT nasal spray 2 sprays by Nasal route daily.    . montelukast (SINGULAIR) 10 MG tablet Take 10 mg by mouth daily.    . Multiple Vitamin (MULTIVITAMIN) tablet Take 1 tablet by mouth daily.     Review of Systems  Constitutional: Negative.   HENT: Negative.   Eyes: Negative.   Respiratory: Negative.   Cardiovascular: Negative.   Gastrointestinal: Negative.   Endocrine: Negative.   Musculoskeletal: Negative.   Skin: Negative.   Allergic/Immunologic: Negative.   Neurological: Negative.   Hematological: Negative.   Psychiatric/Behavioral: Negative.   All  other systems reviewed and are negative.   BP 140/72  Pulse 52  Ht 5\' 8"  (1.727 m)  Wt 148 lb 8 oz (67.359 kg)  BMI 22.58 kg/m2  Physical Exam  Nursing note and vitals reviewed. Constitutional: She is oriented to person, place, and time. She appears well-developed and well-nourished.  HENT:  Head: Normocephalic.  Nose: Nose normal.  Mouth/Throat: Oropharynx is clear and moist.  Eyes: Conjunctivae are normal. Pupils are equal, round, and reactive to light.  Neck: Normal range of motion. Neck supple. No JVD present.  Cardiovascular: Normal rate, regular rhythm, S1  normal, S2 normal, normal heart sounds and intact distal pulses.  Exam reveals no gallop and no friction rub.   No murmur heard. Pulmonary/Chest: Effort normal and breath sounds normal. No respiratory distress. She has no wheezes. She has no rales. She exhibits no tenderness.  Abdominal: Soft. Bowel sounds are normal. She exhibits no distension. There is no tenderness.  Musculoskeletal: Normal range of motion. She exhibits no edema and no tenderness.  Lymphadenopathy:    She has no cervical adenopathy.  Neurological: She is alert and oriented to person, place, and time. Coordination normal.  Skin: Skin is warm and dry. No rash noted. No erythema.  Psychiatric: She has a normal mood and affect. Her behavior is normal. Judgment and thought content normal.    Assessment and Plan

## 2014-01-17 LAB — HEPATIC FUNCTION PANEL
ALT: 19 IU/L (ref 0–32)
AST: 17 IU/L (ref 0–40)
Albumin: 4.3 g/dL (ref 3.5–4.7)
Alkaline Phosphatase: 77 IU/L (ref 39–117)
Bilirubin, Direct: 0.26 mg/dL (ref 0.00–0.40)
Total Bilirubin: 1.1 mg/dL (ref 0.0–1.2)
Total Protein: 6.2 g/dL (ref 6.0–8.5)

## 2014-01-17 LAB — LIPID PANEL
Chol/HDL Ratio: 1.7 ratio units (ref 0.0–4.4)
Cholesterol, Total: 122 mg/dL (ref 100–199)
HDL: 70 mg/dL (ref >39)
LDL Calculated: 38 mg/dL (ref 0–99)
Triglycerides: 70 mg/dL (ref 0–149)
VLDL Cholesterol Cal: 14 mg/dL (ref 5–40)

## 2014-01-17 LAB — TSH: TSH: 1.57 u[IU]/mL (ref 0.450–4.500)

## 2014-01-22 ENCOUNTER — Other Ambulatory Visit: Payer: Self-pay

## 2014-01-22 MED ORDER — ATORVASTATIN CALCIUM 80 MG PO TABS: 80.0000 mg | ORAL_TABLET | Freq: Every day | ORAL | Status: DC

## 2014-01-22 NOTE — Telephone Encounter (Signed)
Refill sent for atorvastatin 80 mg but per Dr. Rockey Situ patient is to cut tablet in half.

## 2014-02-09 ENCOUNTER — Ambulatory Visit (INDEPENDENT_AMBULATORY_CARE_PROVIDER_SITE_OTHER): Payer: Medicare Other | Admitting: Podiatry

## 2014-02-09 ENCOUNTER — Ambulatory Visit (INDEPENDENT_AMBULATORY_CARE_PROVIDER_SITE_OTHER): Payer: Medicare Other

## 2014-02-09 ENCOUNTER — Encounter: Payer: Self-pay | Admitting: Podiatry

## 2014-02-09 VITALS — BP 144/66 | HR 57 | Resp 16 | Ht 68.0 in | Wt 159.0 lb

## 2014-02-09 DIAGNOSIS — M7750 Other enthesopathy of unspecified foot: Secondary | ICD-10-CM

## 2014-02-09 DIAGNOSIS — M7751 Other enthesopathy of right foot: Secondary | ICD-10-CM

## 2014-02-09 DIAGNOSIS — M2040 Other hammer toe(s) (acquired), unspecified foot: Secondary | ICD-10-CM

## 2014-02-09 NOTE — Progress Notes (Signed)
   Subjective:    Patient ID: Leah Hayden, female    DOB: November 12, 1927, 80 y.o.   MRN: [id number]  HPI Comments: i have a bad toe on my rt foot. The toe hurts. The shoe rubs it. The toe is sore and now there is a pain that will come. The toe is getting worse. Its been like this for several months. i wear orthotics. i got the orthotics 15 yrs ago at [facility].   Foot Pain      Review of Systems     Objective:   Physical Exam: I have reviewed her past medical history medications allergies surgeries social history and review of systems. Pulses are bilaterally palpable neurologic sensorium is intact per since once the monofilament. Deep to reflexes are intact bilateral. Muscle strength is 5 over 5 dorsiflexors plantar flexors inverters everters all intrinsic musculature is intact. Orthopedic evaluation demonstrates hammertoe deformities bilateral. Rigid hammertoe deformity at the PIPJ second digit of the right foot with a area of erythema overlying the dorsal aspect second toe. Radiographic evaluation does demonstrate hammertoe deformities with osteoarthritis second metatarsophalangeal joint second toe. A palpable bursa is noted beneath the PIPJ of the second digit right foot with overlying erythema and a callus.        Assessment & Plan:  Assessment: Hammertoe deformity with bursitis capsulitis second right.  Plan: Injected dexamethasone and local anesthetic to the bursa at the PIPJ second digit of the right foot and debridement reactive hyperkeratosis. We discussed surgical intervention also discussed spot stretching her shoes.

## 2014-02-17 ENCOUNTER — Other Ambulatory Visit: Payer: Self-pay | Admitting: Cardiovascular Disease

## 2014-04-23 ENCOUNTER — Ambulatory Visit (INDEPENDENT_AMBULATORY_CARE_PROVIDER_SITE_OTHER): Payer: Medicare Other | Admitting: Internal Medicine

## 2014-04-23 ENCOUNTER — Encounter: Payer: Self-pay | Admitting: Internal Medicine

## 2014-04-23 VITALS — BP 150/70 | HR 60 | Temp 98.1°F | Wt 143.0 lb

## 2014-04-23 DIAGNOSIS — I480 Paroxysmal atrial fibrillation: Secondary | ICD-10-CM

## 2014-04-23 DIAGNOSIS — R233 Spontaneous ecchymoses: Secondary | ICD-10-CM

## 2014-04-23 DIAGNOSIS — M2040 Other hammer toe(s) (acquired), unspecified foot: Secondary | ICD-10-CM

## 2014-04-23 DIAGNOSIS — M2041 Other hammer toe(s) (acquired), right foot: Secondary | ICD-10-CM

## 2014-04-23 DIAGNOSIS — I4891 Unspecified atrial fibrillation: Secondary | ICD-10-CM

## 2014-04-23 NOTE — Progress Notes (Signed)
Pre visit review using our clinic review tool, if applicable. No additional management support is needed unless otherwise documented below in the visit note. 

## 2014-04-23 NOTE — Assessment & Plan Note (Signed)
Regular now on amiodarone Will recheck liver function also

## 2014-04-23 NOTE — Assessment & Plan Note (Signed)
Fairly extensive but not really worrisome. Forearms certainly not as concerning but neck/face is more so No visceral bleeding   Discussed that this might just be from the eliquis With her age, and Leah Hayden is close to dose adjustment area. If she has ongoing problems, we might want to decrease the eliquis to 2.5mg  bid. But no change for now Will check labs

## 2014-04-23 NOTE — Assessment & Plan Note (Signed)
Right 2nd More pain lately Discussed using a pad Has appt with Dr Milinda Pointer

## 2014-04-23 NOTE — Progress Notes (Signed)
Subjective:    Patient ID: Leah Hayden, female    DOB: February 25, 1928, 80 y.o.   MRN: [id number]  HPI Concerned about multiple bruising spots Really worsened considerably in the past few days Has bruised easy for years Regular with eliquis  No fever Not sick but has had some nagging headache Mild balance problems--but hasn't fallen  No gingival bleeding Stools are dark--no obvious blood No hematuria--bad odor though  Current Outpatient Prescriptions on File Prior to Visit  Medication Sig Dispense Refill  . acetaminophen (TYLENOL) 325 MG tablet Take 650 mg by mouth every 6 (six) hours as needed.        Marland Kitchen albuterol (PROVENTIL HFA) 108 (90 BASE) MCG/ACT inhaler Inhale 2 puffs into the lungs every 6 (six) hours as needed.        Marland Kitchen amiodarone (PACERONE) 200 MG tablet Take 1 tablet (200 mg total) by mouth daily.  30 tablet  6  . atorvastatin (LIPITOR) 80 MG tablet Take 1 tablet (80 mg total) by mouth daily.  30 tablet  6  . calcium-vitamin D (OSCAL) 250-125 MG-UNIT per tablet Take 1 tablet by mouth daily.        [name] Sodium (STOOL SOFTENER) 100 MG capsule Take 100 mg by mouth 2 (two) times daily.        Marland Kitchen ELIQUIS 5 MG TABS tablet TAKE 1 TABLET BY MOUTH TWICE A DAY  60 tablet  6  . Fluticasone-Salmeterol (ADVAIR DISKUS) 100-50 MCG/DOSE AEPB Inhale 1 puff into the lungs every 12 (twelve) hours.        . mometasone (NASONEX) 50 MCG/ACT nasal spray 2 sprays by Nasal route daily.        . montelukast (SINGULAIR) 10 MG tablet Take 10 mg by mouth daily.        . Multiple Vitamin (MULTIVITAMIN) tablet Take 1 tablet by mouth daily.         No current facility-administered medications on file prior to visit.    Allergies  Allergen Reactions  . Other Rash    oramycin    Past Medical History  Diagnosis Date  . Allergic rhinitis   . Asthma   . HX: breast cancer   . Hx of colonic polyps   . Diverticulosis of colon   . GERD (gastroesophageal reflux disease)   . Meniere's disease     . Osteoarthritis   . Sinus brady-tachy syndrome     a. Post-conversion pause of 7.2 seconds during 11/2012 hospitalization @ [facility].  Marland Kitchen PAF (paroxysmal atrial fibrillation)     a. Eliquis and amio initiated 11/2012 in setting of admission with RVR and NSTEMI  . Biceps tendon tear     a. right->s/p surgical repair winter of 2014.  . NSTEMI (non-ST elevated myocardial infarction)     a. 11/2012 in setting of rapid afib;  b. 11/2012 Cath: nl EF, nonobs CAD->Med Rx;  b. 11/2012 Echo: EF 60-65%, mild LVH, mild MR/TR.    Past Surgical History  Procedure Laterality Date  . Mastectomy  1984    left   . Mastectomy  1985    right  . Partial hysterectomy  1966    w/ bladder tack  . Correction hammer toe  1997    2nd to left toe  . Esophagogastroduodenoscopy  10/1999    inflammation only   . Toe surgery  2001/2002    2nd/3rd,left   . Rotator cuff repair  10/09    right   . Abdominal hysterectomy    .  Foreign body removal Right 01/02/2013    Procedure: FOREIGN BODY REMOVAL RIGHT INDEX FINGER;  Surgeon: Cammie Sickle., MD;  Location: [place];  Service: Orthopedics;  Laterality: Right;  . Cardiac catheterization  11/2013    [facility]    Family History  Problem Relation Age of Onset  . Heart failure Mother   . Gout Mother   . Heart disease Mother     heart failure  . Arthritis Mother   . Heart failure Father   . Heart disease Father     heart failure  . Diabetes Maternal Grandmother   . Colon cancer Maternal Grandfather   . Cancer Maternal Grandfather     colon  . Arthritis Brother     History   Social History  . Marital Status: Widowed    Spouse Name: N/A    Number of Children: 2  . Years of Education: N/A   Occupational History  . 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 Smoking status: Never Smoker   . Smokeless tobacco: Never Used  . Alcohol Use: No  . Drug Use: No  . Sexual Activity: Not on file   Other  Topics Concern  . Not on file   Social History Narrative   No living will   No health care POA but requests both sons to be health care POAs   Has DNR   No tube feeds if cognitively unaware   Review of Systems Appetite is good--not a real balanced diet Has lost about 5# in past 5 months Sleeping okay but awakens ~3AM. Initiates sleep 9-10PM. Can usually doze off again. Awakens refreshed for the most part.  Still on full work schedule    Objective:   Physical Exam  Constitutional: She appears well-developed and well-nourished. No distress.  Neck: Normal range of motion. Neck supple.  Cardiovascular: Normal rate, regular rhythm and normal heart sounds.  Exam reveals no gallop.   No murmur heard. Pulmonary/Chest: Effort normal and breath sounds normal. No respiratory distress. She has no wheezes. She has no rales.  Lymphadenopathy:    She has no cervical adenopathy.  Skin:  Scattered small ecchymotic areas on neck and then forearms (with slight extension to distal arms) No hematomas Trunk and legs spared          Assessment & Plan:

## 2014-04-24 LAB — CBC WITH DIFFERENTIAL/PLATELET
Basophils Absolute: 0 10*3/uL (ref 0.0–0.1)
Basophils Relative: 0.4 % (ref 0.0–3.0)
Eosinophils Absolute: 0.1 10*3/uL (ref 0.0–0.7)
Eosinophils Relative: 1.2 % (ref 0.0–5.0)
HCT: 40.3 % (ref 36.0–46.0)
Hemoglobin: 13.4 g/dL (ref 12.0–15.0)
Lymphocytes Relative: 20.1 % (ref 12.0–46.0)
Lymphs Abs: 1.4 10*3/uL (ref 0.7–4.0)
MCHC: 33.4 g/dL (ref 30.0–36.0)
MCV: 94.1 fl (ref 78.0–100.0)
Monocytes Absolute: 0.5 10*3/uL (ref 0.1–1.0)
Monocytes Relative: 6.9 % (ref 3.0–12.0)
Neutro Abs: 4.9 10*3/uL (ref 1.4–7.7)
Neutrophils Relative %: 71.4 % (ref 43.0–77.0)
Platelets: 257 10*3/uL (ref 150.0–400.0)
RBC: 4.28 Mil/uL (ref 3.87–5.11)
RDW: 14.1 % (ref 11.5–15.5)
WBC: 6.9 10*3/uL (ref 4.0–10.5)

## 2014-04-24 LAB — COMPREHENSIVE METABOLIC PANEL
ALT: 23 U/L (ref 0–35)
AST: 23 U/L (ref 0–37)
Albumin: 3.7 g/dL (ref 3.5–5.2)
Alkaline Phosphatase: 69 U/L (ref 39–117)
BUN: 15 mg/dL (ref 6–23)
CO2: 31 mEq/L (ref 19–32)
Calcium: 9.1 mg/dL (ref 8.4–10.5)
Chloride: 103 mEq/L (ref 96–112)
Creatinine, Ser: 0.9 mg/dL (ref 0.4–1.2)
GFR: 62.56 mL/min (ref >60.00)
Glucose, Bld: 83 mg/dL (ref 70–99)
Potassium: 4.1 mEq/L (ref 3.5–5.1)
Sodium: 140 mEq/L (ref 135–145)
Total Bilirubin: 0.8 mg/dL (ref 0.2–1.2)
Total Protein: 6.5 g/dL (ref 6.0–8.3)

## 2014-04-24 LAB — T4, FREE: Free T4: 1.72 ng/dL — ABNORMAL HIGH (ref 0.60–1.60)

## 2014-04-24 LAB — TSH: TSH: 0.73 u[IU]/mL (ref 0.35–4.50)

## 2014-04-27 ENCOUNTER — Ambulatory Visit: Payer: Medicare Other | Admitting: Podiatry

## 2014-05-08 ENCOUNTER — Encounter: Payer: Self-pay | Admitting: Internal Medicine

## 2014-05-08 ENCOUNTER — Ambulatory Visit (INDEPENDENT_AMBULATORY_CARE_PROVIDER_SITE_OTHER): Payer: Medicare Other | Admitting: Internal Medicine

## 2014-05-08 VITALS — BP 110/79 | HR 55 | Temp 97.7°F | Ht 68.0 in | Wt 145.0 lb

## 2014-05-08 DIAGNOSIS — Z0000 Encounter for general adult medical examination without abnormal findings: Secondary | ICD-10-CM

## 2014-05-08 DIAGNOSIS — M2040 Other hammer toe(s) (acquired), unspecified foot: Secondary | ICD-10-CM

## 2014-05-08 DIAGNOSIS — M2041 Other hammer toe(s) (acquired), right foot: Secondary | ICD-10-CM

## 2014-05-08 DIAGNOSIS — Z23 Encounter for immunization: Secondary | ICD-10-CM

## 2014-05-08 DIAGNOSIS — I4891 Unspecified atrial fibrillation: Secondary | ICD-10-CM

## 2014-05-08 DIAGNOSIS — J45909 Unspecified asthma, uncomplicated: Secondary | ICD-10-CM

## 2014-05-08 DIAGNOSIS — I214 Non-ST elevation (NSTEMI) myocardial infarction: Secondary | ICD-10-CM

## 2014-05-08 DIAGNOSIS — J309 Allergic rhinitis, unspecified: Secondary | ICD-10-CM

## 2014-05-08 DIAGNOSIS — J4520 Mild intermittent asthma, uncomplicated: Secondary | ICD-10-CM

## 2014-05-08 DIAGNOSIS — I480 Paroxysmal atrial fibrillation: Secondary | ICD-10-CM

## 2014-05-08 LAB — TSH: TSH: 0.9 u[IU]/mL (ref 0.35–4.50)

## 2014-05-08 LAB — T4, FREE: Free T4: 1.52 ng/dL (ref 0.60–1.60)

## 2014-05-08 LAB — T3, FREE: T3, Free: 2.7 pg/mL (ref 2.3–4.2)

## 2014-05-08 NOTE — Assessment & Plan Note (Signed)
6 months out No beta blocker due to bradycardia

## 2014-05-08 NOTE — Assessment & Plan Note (Signed)
Controlled with meds

## 2014-05-08 NOTE — Assessment & Plan Note (Signed)
Only real troubling thing for her She will go see Dr Milinda Pointer It has been 6 months since the MI--which was rate related/demand probably Would need cardiology clearance but probably can have regional and sedation

## 2014-05-08 NOTE — Assessment & Plan Note (Signed)
Doing well on her current regimen Sees Dr Donneta Romberg

## 2014-05-08 NOTE — Progress Notes (Signed)
Pre visit review using our clinic review tool, if applicable. No additional management support is needed unless otherwise documented below in the visit note. 

## 2014-05-08 NOTE — Assessment & Plan Note (Signed)
I have personally reviewed the Medicare Annual Wellness questionnaire and have noted 1. The patient's medical and social history 2. Their use of alcohol, tobacco or illicit drugs 3. Their current medications and supplements 4. The patient's functional ability including ADL's, fall risks, home safety risks and hearing or visual             impairment. 5. Diet and physical activities 6. Evidence for depression or mood disorders  The patients weight, height, BMI and visual acuity have been recorded in the chart I have made referrals, counseling and provided education to the patient based review of the above and I have provided the pt with a written personalized care plan for preventive services.  I have provided you with a copy of your personalized plan for preventive services. Please take the time to review along with your updated medication list.  Will update with prevnar and flu today No cancer screening due to age 80 vitamin D--will defer DEXA Mild memory issues--doesn't seem pathologic

## 2014-05-08 NOTE — Assessment & Plan Note (Signed)
Still regular On eliquis and amiodarone

## 2014-05-08 NOTE — Progress Notes (Signed)
Subjective:    Patient ID: Leah Hayden, female    DOB: Feb 21, 1928, 80 y.o.   MRN: [id number]  HPI Here for physical, Medicare wellness and follow up Reviewed advanced directives No falls No depression or anhedonia No tobacco or alcohol Stays active on her job and keeps up house. No set exercise Mild hearing loss. Needs eye check up--vision seems okay Has noticed some mild memory issues---nothing that affects her function Independent in instrumental ADLs Sees Dr Daylene Katayama for surgery (arm), Hyatt for feet, Donneta Romberg for allergies, Dr Sport and exercise psychologist  Still having bruising Discussed reassuring labs On eliquis and amiodarone  Hammertoe pain is worse Saw Dr Alvera Novel discussed surgical correction I think it is reasonable for her to do this under regional block and sedation as long as cardiologist is okay with it  Doing fine with asthma and allergies Keeps up with Dr Donneta Romberg  Heart seems to be regular as far as she can tell No palpitations No chest pain No SOB Has been back to work  Current Outpatient Prescriptions on File Prior to Visit  Medication Sig Dispense Refill  . acetaminophen (TYLENOL) 325 MG tablet Take 650 mg by mouth every 6 (six) hours as needed.        Marland Kitchen albuterol (PROVENTIL HFA) 108 (90 BASE) MCG/ACT inhaler Inhale 2 puffs into the lungs every 6 (six) hours as needed.        Marland Kitchen amiodarone (PACERONE) 200 MG tablet Take 1 tablet (200 mg total) by mouth daily.  30 tablet  6  . atorvastatin (LIPITOR) 80 MG tablet Take 1 tablet (80 mg total) by mouth daily.  30 tablet  6  . calcium-vitamin D (OSCAL) 250-125 MG-UNIT per tablet Take 1 tablet by mouth daily.        [name] Sodium (STOOL SOFTENER) 100 MG capsule Take 100 mg by mouth 2 (two) times daily.        Marland Kitchen ELIQUIS 5 MG TABS tablet TAKE 1 TABLET BY MOUTH TWICE A DAY  60 tablet  6  . Fluticasone-Salmeterol (ADVAIR DISKUS) 100-50 MCG/DOSE AEPB Inhale 1 puff into the lungs every 12 (twelve) hours.        .  mometasone (NASONEX) 50 MCG/ACT nasal spray 2 sprays by Nasal route daily.        . montelukast (SINGULAIR) 10 MG tablet Take 10 mg by mouth daily.        . Multiple Vitamin (MULTIVITAMIN) tablet Take 1 tablet by mouth daily.         No current facility-administered medications on file prior to visit.    Allergies  Allergen Reactions  . Other Rash    oramycin    Past Medical History  Diagnosis Date  . Allergic rhinitis   . Asthma   . HX: breast cancer   . Hx of colonic polyps   . Diverticulosis of colon   . GERD (gastroesophageal reflux disease)   . Meniere's disease   . Osteoarthritis   . Sinus brady-tachy syndrome     a. Post-conversion pause of 7.2 seconds during 11/2012 hospitalization @ [facility].  Marland Kitchen PAF (paroxysmal atrial fibrillation)     a. Eliquis and amio initiated 11/2012 in setting of admission with RVR and NSTEMI  . Biceps tendon tear     a. right->s/p surgical repair winter of 2014.  . NSTEMI (non-ST elevated myocardial infarction)     a. 11/2012 in setting of rapid afib;  b. 11/2012 Cath: nl EF, nonobs CAD->Med Rx;  b. 11/2012 Echo:  EF 60-65%, mild LVH, mild MR/TR.    Past Surgical History  Procedure Laterality Date  . Mastectomy  1984    left   . Mastectomy  1985    right  . Partial hysterectomy  1966    w/ bladder tack  . Correction hammer toe  1997    2nd to left toe  . Esophagogastroduodenoscopy  10/1999    inflammation only   . Toe surgery  2001/2002    2nd/3rd,left   . Rotator cuff repair  10/09    right   . Abdominal hysterectomy    . Foreign body removal Right 01/02/2013    Procedure: FOREIGN BODY REMOVAL RIGHT INDEX FINGER;  Surgeon: Cammie Sickle., MD;  Location: [place];  Service: Orthopedics;  Laterality: Right;  . Cardiac catheterization  11/2013    [facility]    Family History  Problem Relation Age of Onset  . Heart failure Mother   . Gout Mother   . Heart disease Mother     heart failure  . Arthritis Mother   . Heart  failure Father   . Heart disease Father     heart failure  . Diabetes Maternal Grandmother   . Colon cancer Maternal Grandfather   . Cancer Maternal Grandfather     colon  . Arthritis Brother     History   Social History  . Marital Status: Widowed    Spouse Name: N/A    Number of Children: 2  . Years of Education: N/A   Occupational History  . 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 Smoking status: Never Smoker   . Smokeless tobacco: Never Used  . Alcohol Use: No  . Drug Use: No  . Sexual Activity: Not on file   Other Topics Concern  . Not on file   Social History Narrative   Now has living will   Sons are health care POAs   Has DNR   No tube feeds if cognitively unaware   Review of Systems  Constitutional: Negative for fatigue and unexpected weight change.       Wears seat belt  HENT: Negative for dental problem, hearing loss and tinnitus.        Keeps up with dentist  Eyes: Negative for visual disturbance.       No diplopia or unilateral vision loss  Respiratory: Negative for cough, chest tightness and shortness of breath.   Cardiovascular: Negative for chest pain, palpitations and leg swelling.  Gastrointestinal: Positive for constipation. Negative for nausea, vomiting, abdominal pain and blood in stool.       No heartburn OTC laxatives rarely will keep her regular  Endocrine: Negative for polydipsia and polyuria.  Genitourinary: Negative for dysuria, hematuria and difficulty urinating.  Musculoskeletal: Negative for arthralgias, back pain and joint swelling.  Skin: Negative for rash.  Allergic/Immunologic: Positive for environmental allergies. Negative for immunocompromised state.  Neurological: Positive for dizziness and headaches. Negative for syncope, weakness, light-headedness and numbness.       1 episode of dizziness at night--didn't fall Rare headaches--tylenol relieves  Hematological: Negative for  adenopathy. Bruises/bleeds easily.  Psychiatric/Behavioral: Negative for sleep disturbance and dysphoric mood. The patient is not nervous/anxious.        Objective:   Physical Exam  Constitutional: She is oriented to person, place, and time. She appears well-developed and well-nourished. No distress.  HENT:  Head: Normocephalic and atraumatic.  Right  Ear: External ear normal.  Left Ear: External ear normal.  Mouth/Throat: Oropharynx is clear and moist. No oropharyngeal exudate.  Eyes: Conjunctivae and EOM are normal. Pupils are equal, round, and reactive to light.  Neck: Normal range of motion. Neck supple. No thyromegaly present.  Cardiovascular: Regular rhythm, normal heart sounds and intact distal pulses.  Exam reveals no gallop.   No murmur heard. Mild bradycardia  Pulmonary/Chest: Effort normal and breath sounds normal. No respiratory distress. She has no wheezes. She has no rales.  Abdominal: Soft. There is no tenderness.  Musculoskeletal: She exhibits no edema and no tenderness.  Lymphadenopathy:    She has no cervical adenopathy.  Neurological: She is alert and oriented to person, place, and time.  President-- "Obama, Anne Hahn--- then [employer]" [phone] [id number] Recall 1/3  Skin: No rash noted. No erythema.  Still with scattered ecchymoses  Psychiatric: She has a normal mood and affect. Her behavior is normal.          Assessment & Plan:

## 2014-05-13 ENCOUNTER — Encounter: Payer: Self-pay | Admitting: Cardiovascular Disease

## 2014-05-13 ENCOUNTER — Ambulatory Visit (INDEPENDENT_AMBULATORY_CARE_PROVIDER_SITE_OTHER): Payer: Medicare Other | Admitting: Cardiovascular Disease

## 2014-05-13 VITALS — BP 140/62 | HR 51 | Ht 68.0 in | Wt 142.8 lb

## 2014-05-13 DIAGNOSIS — I4891 Unspecified atrial fibrillation: Secondary | ICD-10-CM

## 2014-05-13 DIAGNOSIS — I480 Paroxysmal atrial fibrillation: Secondary | ICD-10-CM

## 2014-05-13 DIAGNOSIS — M2040 Other hammer toe(s) (acquired), unspecified foot: Secondary | ICD-10-CM

## 2014-05-13 DIAGNOSIS — E785 Hyperlipidemia, unspecified: Secondary | ICD-10-CM | POA: Insufficient documentation

## 2014-05-13 DIAGNOSIS — M2041 Other hammer toe(s) (acquired), right foot: Secondary | ICD-10-CM

## 2014-05-13 DIAGNOSIS — Z01810 Encounter for preprocedural cardiovascular examination: Secondary | ICD-10-CM | POA: Insufficient documentation

## 2014-05-13 DIAGNOSIS — I471 Supraventricular tachycardia: Secondary | ICD-10-CM

## 2014-05-13 NOTE — Assessment & Plan Note (Signed)
No recent episodes of SVT per the patient

## 2014-05-13 NOTE — Patient Instructions (Signed)
You are doing well. No medication changes were made.  Please hold the eliquis 3 days before the toe surgery Ask Dr. Milinda Pointer when you can restart eliquis  Please call us if you have new issues that need to be addressed before your next appt.  Your physician wants you to follow-up in: 6 months.  You will receive a reminder letter in the mail two months in advance. If you don't receive a letter, please call our office to schedule the follow-up appointment.

## 2014-05-13 NOTE — Assessment & Plan Note (Signed)
She denies any tachycardia or palpitations concerning for arrhythmia. No medication changes made

## 2014-05-13 NOTE — Assessment & Plan Note (Signed)
Currently on Lipitor. She reports she is taking 40 mg daily. We'll monitor her her lab work and followup. May be able to wean the dose down even further, down to 20 mg

## 2014-05-13 NOTE — Progress Notes (Signed)
Patient ID: Leah Hayden, female    DOB: [birth year], 80 y.o.   MRN: [id number]  HPI Comments: Leah Hayden is a very pleasant 80 year old woman with a history of SVT as well as pauses on Holter, admitted to the hospital August 2012 with atrial fibrillation and converted shortly after to normal sinus rhythm. At least one additional episode of atrial fibrillation since then. History of torn biceps of her right arm at the end of September 2014. She wore a splint for 6 weeks. Seen by Dr. Daylene Hayden in [place].  She presents for routine followup  In followup today, she reports having severe toe pain. She has a hammertoe and would like to have surgery. Dr. Milinda Hayden would be performing the surgery. If possible, she would have this done tomorrow as she is in so much discomfort. She would like to know if it is okay to stop the anticoagulation.  Otherwise she reports that she is doing well with no complaints. She denies having any tachycardia or palpitations. She continues to work full time, currently working 6 hours per day, scheduled to start 8 hour per day shift in the near future. She needs to work for extra money, for company, to avoid being depressed at home.  Total cholesterol 122, LDL 38. She takes Lipitor 40 mg daily  In the middle of April 2015, she awoke in the middle of the night with recurrent tachypalps and chest burning.  she called EMS the following morning.  She was found to be in afib with rvr and was taken to [facility] for eval.  There, troponin was elevated.  Initial ECG showed subtle anterolateral ST elevation.  She was initially treated with IV dilt and oral BB with rate improvement and later conversion.  In the setting of NSTEMI, she underwent cath, which showed nonobs CAD and nl LV fxn. F/U echo corroborated nl LV fxn.    When she converted from afib to sinus, she had a 7.2 second pause, she was asymptomatic.  She was started on antiarrhythmic therapy in order to prevent recurrent afib and  subsequently recurrent post-termination pauses. Eliquis was also started.   EKG shows normal sinus rhythm with rate 51 beats per minute with no significant ST or T wave changes Heart rate is slower than before likely from amiodarone. She is asymptomatic     Outpatient Encounter Prescriptions as of 05/13/2014  Medication Sig  . acetaminophen (TYLENOL) 325 MG tablet Take 650 mg by mouth every 6 (six) hours as needed.    Marland Kitchen albuterol (PROVENTIL HFA) 108 (90 BASE) MCG/ACT inhaler Inhale 2 puffs into the lungs every 6 (six) hours as needed.    Marland Kitchen amiodarone (PACERONE) 200 MG tablet Take 1 tablet (200 mg total) by mouth daily.  Marland Kitchen atorvastatin (LIPITOR) 80 MG tablet Take 1 tablet (80 mg total) by mouth daily.  Marland Kitchen BISACODYL PO Take by mouth.  . calcium-vitamin D (OSCAL) 250-125 MG-UNIT per tablet Take 1 tablet by mouth daily.    Leah Hayden Sodium (STOOL SOFTENER) 100 MG capsule Take 100 mg by mouth 2 (two) times daily.    Marland Kitchen ELIQUIS 5 MG TABS tablet TAKE 1 TABLET BY MOUTH TWICE A DAY  . Fluticasone-Salmeterol (ADVAIR DISKUS) 100-50 MCG/DOSE AEPB Inhale 1 puff into the lungs every 12 (twelve) hours.    . mometasone (NASONEX) 50 MCG/ACT nasal spray 2 sprays by Nasal route daily.    . montelukast (SINGULAIR) 10 MG tablet Take 10 mg by mouth daily.    . Multiple  Vitamin (MULTIVITAMIN) tablet Take 1 tablet by mouth daily.    Marland Kitchen triamcinolone lotion (KENALOG) 0.1 %     Review of Systems  Constitutional: Negative.   HENT: Negative.   Eyes: Negative.   Respiratory: Negative.   Cardiovascular: Negative.   Gastrointestinal: Negative.   Endocrine: Negative.   Musculoskeletal: Negative.        Severe toe pain from hammertoe  Skin: Negative.   Allergic/Immunologic: Negative.   Neurological: Negative.   Hematological: Negative.   Psychiatric/Behavioral: Negative.   All other systems reviewed and are negative.   BP 140/62  Pulse 51  Ht 5\' 8"  (1.727 m)  Wt 142 lb 12 oz (64.751 kg)  BMI 21.71  kg/m2  Physical Exam  Nursing note and vitals reviewed. Constitutional: She is oriented to person, place, and time. She appears well-developed and well-nourished.  HENT:  Head: Normocephalic.  Nose: Nose normal.  Mouth/Throat: Oropharynx is clear and moist.  Eyes: Conjunctivae are normal. Pupils are equal, round, and reactive to light.  Neck: Normal range of motion. Neck supple. No JVD present.  Cardiovascular: Normal rate, regular rhythm, S1 normal, S2 normal, normal heart sounds and intact distal pulses.  Exam reveals no gallop and no friction rub.   No murmur heard. Pulmonary/Chest: Effort normal and breath sounds normal. No respiratory distress. She has no wheezes. She has no rales. She exhibits no tenderness.  Abdominal: Soft. Bowel sounds are normal. She exhibits no distension. There is no tenderness.  Musculoskeletal: Normal range of motion. She exhibits no edema and no tenderness.  Lymphadenopathy:    She has no cervical adenopathy.  Neurological: She is alert and oriented to person, place, and time. Coordination normal.  Skin: Skin is warm and dry. No rash noted. No erythema.  Psychiatric: She has a normal mood and affect. Her behavior is normal. Judgment and thought content normal.    Assessment and Plan

## 2014-05-13 NOTE — Assessment & Plan Note (Signed)
She'll be acceptable risk for surgery on her foot for hammertoe with Dr. Milinda Pointer. Suggested she hold the anticoagulation 3 days prior to the procedure. Anticoagulation could be restarted when Dr. Milinda Pointer feels it is appropriate from a surgical perspective, possibly 48 hours later.

## 2014-05-13 NOTE — Assessment & Plan Note (Signed)
She is having severe pain and would like surgery. She be acceptable risk. No further testing needed

## 2014-05-26 ENCOUNTER — Ambulatory Visit (INDEPENDENT_AMBULATORY_CARE_PROVIDER_SITE_OTHER): Payer: Medicare Other | Admitting: Family Medicine

## 2014-05-26 ENCOUNTER — Encounter: Payer: Self-pay | Admitting: Family Medicine

## 2014-05-26 VITALS — BP 124/60 | HR 60 | Temp 98.6°F | Ht 68.0 in | Wt 143.0 lb

## 2014-05-26 DIAGNOSIS — E441 Mild protein-calorie malnutrition: Secondary | ICD-10-CM | POA: Insufficient documentation

## 2014-05-26 DIAGNOSIS — R21 Rash and other nonspecific skin eruption: Secondary | ICD-10-CM | POA: Insufficient documentation

## 2014-05-26 DIAGNOSIS — R634 Abnormal weight loss: Secondary | ICD-10-CM

## 2014-05-26 LAB — CBC WITH DIFFERENTIAL/PLATELET
Basophils Absolute: 0 10*3/uL (ref 0.0–0.1)
Basophils Relative: 0.4 % (ref 0.0–3.0)
Eosinophils Absolute: 0.1 10*3/uL (ref 0.0–0.7)
Eosinophils Relative: 2.5 % (ref 0.0–5.0)
HCT: 38.6 % (ref 36.0–46.0)
Hemoglobin: 12.6 g/dL (ref 12.0–15.0)
Lymphocytes Relative: 23.7 % (ref 12.0–46.0)
Lymphs Abs: 1.1 10*3/uL (ref 0.7–4.0)
MCHC: 32.7 g/dL (ref 30.0–36.0)
MCV: 94.9 fl (ref 78.0–100.0)
Monocytes Absolute: 0.4 10*3/uL (ref 0.1–1.0)
Monocytes Relative: 8.5 % (ref 3.0–12.0)
Neutro Abs: 3.1 10*3/uL (ref 1.4–7.7)
Neutrophils Relative %: 64.9 % (ref 43.0–77.0)
Platelets: 190 10*3/uL (ref 150.0–400.0)
RBC: 4.07 Mil/uL (ref 3.87–5.11)
RDW: 13.9 % (ref 11.5–15.5)
WBC: 4.7 10*3/uL (ref 4.0–10.5)

## 2014-05-26 LAB — SEDIMENTATION RATE: Sed Rate: 18 mm/hr (ref 0–22)

## 2014-05-26 NOTE — Addendum Note (Signed)
Addended by: Eliezer Lofts E on: 05/26/2014 02:35 PM   Modules accepted: Orders

## 2014-05-26 NOTE — Progress Notes (Signed)
   Subjective:    Patient ID: Leah Hayden, female    DOB: 06-03-1928, 80 y.o.   MRN: [id number]  Rash This is a new problem. The current episode started more than 1 month ago (3-4 months ago.). The problem has been gradually worsening since onset. The affected locations include the left lower leg, scalp, left hand, right arm, right lower leg and right hand. Pertinent negatives include no eye pain, fatigue, fever or shortness of breath. Treatments tried: eucerin  cream, prednsione, steroid injeciton. The treatment provided mild relief. There is no history of allergies, asthma, eczema or varicella.  Rash is very itchy. Saw Derm 02/2014 for same issue.  Felt neuro dermatitis.Given steroid shot, gave prednisone tablets taper. Helped minimally.  triamcoinolone cream on scalp. Unable to get in into 08/2014.  Has been on Eliqus since April.   Works, exposed to [employer] but nothing new, no new exposure.  04/26/2014 cbc was normal. TSH 9/3  She has hx of breast cancer.  She has been feeling well until last week. Some more fatigue, but has allergies when it get colder. She has 30 lbs in the last year. Good appetitie. Wt Readings from Last 3 Encounters:  05/26/14 143 lb (64.864 kg)  05/13/14 142 lb 12 oz (64.751 kg)  05/08/14 145 lb (65.772 kg)     Review of Systems  Constitutional: Negative for fever and fatigue.  HENT: Negative for ear pain.   Eyes: Negative for pain.  Respiratory: Negative for chest tightness and shortness of breath.   Cardiovascular: Negative for chest pain, palpitations and leg swelling.  Gastrointestinal: Negative for abdominal pain.  Genitourinary: Negative for dysuria.  Skin: Positive for rash.       Objective:   Physical Exam  Constitutional: She appears well-developed.  Elderly thin female in NAD.  HENT:  Right Ear: External ear normal.  Mouth/Throat: Oropharynx is clear and moist.  Eyes: Conjunctivae are normal. Pupils are equal, round, and reactive to  light.  Neck: Normal range of motion. Neck supple. No thyromegaly present.  Cardiovascular: Normal rate.   No murmur heard. Pulmonary/Chest: Effort normal and breath sounds normal.  Skin:  multiple contusions on B hands , anf forearms, hemosiderin deposition B legs  B lower legs and arms with non-blanching erythematous rash, papules          Assessment & Plan:

## 2014-05-26 NOTE — Assessment & Plan Note (Signed)
Has lost weight in last  Year, 30lbs.

## 2014-05-26 NOTE — Patient Instructions (Addendum)
Stop at lab on way out. Will try higher potency steroid compounded with eucerin to ease application. Keep follow up with Derm as planned if not improving.

## 2014-05-26 NOTE — Assessment & Plan Note (Signed)
Vascular appearing rash, but very itchy skin.  Background venous stasis derm in legs and contusions from eliqus on arms No clear meds causing itchy rash.  Given age and weight loss: concern for cancer cause of rash. Will send for cbc and multiple myeloma panel. Will try higher potency steroid compounded with eucerin to ease application.  Keep follow up with Derm as planned if not improving.

## 2014-05-26 NOTE — Progress Notes (Signed)
Pre visit review using our clinic review tool, if applicable. No additional management support is needed unless otherwise documented below in the visit note. 

## 2014-05-27 LAB — ANTI-NUCLEAR AB-TITER (ANA TITER): ANA Titer 1: NEGATIVE

## 2014-05-27 LAB — ANA: Anti Nuclear Antibody(ANA): POSITIVE — AB

## 2014-05-28 LAB — PROTEIN ELECTROPHORESIS, SERUM
Albumin ELP: 58.3 % (ref 55.8–66.1)
Alpha-1-Globulin: 5.8 % — ABNORMAL HIGH (ref 2.9–4.9)
Alpha-2-Globulin: 11.5 % (ref 7.1–11.8)
Beta 2: 5.8 % (ref 3.2–6.5)
Beta Globulin: 7 % (ref 4.7–7.2)
Gamma Globulin: 11.6 % (ref 11.1–18.8)
Total Protein, Serum Electrophoresis: 6.2 g/dL (ref 6.0–8.3)

## 2014-06-01 ENCOUNTER — Encounter: Payer: Self-pay | Admitting: Podiatry

## 2014-06-01 ENCOUNTER — Ambulatory Visit (INDEPENDENT_AMBULATORY_CARE_PROVIDER_SITE_OTHER): Payer: Medicare Other | Admitting: Podiatry

## 2014-06-01 VITALS — BP 129/70 | HR 60 | Resp 16

## 2014-06-01 DIAGNOSIS — M2041 Other hammer toe(s) (acquired), right foot: Secondary | ICD-10-CM

## 2014-06-01 NOTE — Progress Notes (Signed)
She presents today for surgical consult regarding hammertoes to the second third and fourth digits of her right foot. States that his painful toes are affecting her daily activities and her ability to perform his activities she has also spoken to her cardiologist who cleared her for surgery.  Objective: Vital signs are stable she is alert and oriented x3 pulses are palpable to the right foot. She has rigid hammertoe deformities #2 #3 at the PIPJ of the right foot. Adductovarus rotated hammertoe deformity fourth digit of the right foot. #2 and #3 are painful on palpation.  Assessment: Hammertoe deformities #2 #3 and #4 of the right foot.  Plan: Discussed etiology pathology conservative versus surgical therapies. She signed a consent form today for hammertoe repair with a silicone arthroplasty #2 and #3 of the right foot. As well as a derotational arthroplasty fourth digit right foot. I answered all the questions regarding these procedures the best of my ability in layman's terms she understood it was amenable to it signed all 3 pages of the consent form. We did discuss a possible postop complications which may include but are not limited to postop pain bleeding swelling infection recurrence loss of digital also limb loss of life. She understands that he'll be a possible need for further surgery overcorrection or under correction. We will make sure she is cleared from her cardiologist prior to surgery.

## 2014-06-02 ENCOUNTER — Telehealth: Payer: Self-pay | Admitting: Physician Assistant

## 2014-06-02 NOTE — Telephone Encounter (Signed)
Surgical clearance done.

## 2014-06-03 ENCOUNTER — Telehealth: Payer: Self-pay | Admitting: *Deleted

## 2014-06-03 NOTE — Telephone Encounter (Signed)
This is a patient of Dr Rockey Situ, will forward to the [facility] office and Dr Donivan Scull RN

## 2014-06-03 NOTE — Telephone Encounter (Signed)
Spoke w/ pt.  She reports a small amount of blood from one nostril this am about 7:30, then again around 8am. Reports that she has had some nasal congestion and she thinks she may have blown her nose too hard. Advised pt that the change in weather to dryer air may be contributing, as well. Advised her to use a humidifier if her house is too dry or to try saline spray. She verbalizes understanding and will call back if sx resume.

## 2014-06-03 NOTE — Telephone Encounter (Signed)
Patient called and concerned about having a couple nose bleeds and is on Eloquis.

## 2014-06-04 NOTE — Addendum Note (Signed)
Addended by: Despina Hidden on: 06/04/2014 01:06 PM   Modules accepted: Orders

## 2014-06-05 ENCOUNTER — Telehealth: Payer: Self-pay | Admitting: *Deleted

## 2014-06-05 NOTE — Telephone Encounter (Signed)
Called and left a message for patient regarding clearance for surgery. Ok to schedule.

## 2014-06-14 HISTORY — PX: HAMMER TOE SURGERY: SHX385

## 2014-06-23 ENCOUNTER — Telehealth: Payer: Self-pay | Admitting: *Deleted

## 2014-06-23 NOTE — Telephone Encounter (Signed)
Leah Hayden called to see how long she will be out of work due to having surgery 11.13.15. Pt stated she did walk and stand during the day at work. i told pt anywhere from 4 - 6 weeks to be out. Pt understood.

## 2014-06-24 ENCOUNTER — Other Ambulatory Visit: Payer: Self-pay | Admitting: Podiatry

## 2014-06-24 MED ORDER — HYDROCODONE-ACETAMINOPHEN 5-325 MG PO TABS: 1.0000 | ORAL_TABLET | Freq: Four times a day (QID) | ORAL | Status: DC | PRN

## 2014-06-24 MED ORDER — CEPHALEXIN 500 MG PO CAPS: 500.0000 mg | ORAL_CAPSULE | Freq: Three times a day (TID) | ORAL | Status: DC

## 2014-06-26 ENCOUNTER — Encounter: Payer: Self-pay | Admitting: Podiatry

## 2014-06-26 DIAGNOSIS — M2021 Hallux rigidus, right foot: Secondary | ICD-10-CM

## 2014-06-26 DIAGNOSIS — M779 Enthesopathy, unspecified: Secondary | ICD-10-CM

## 2014-06-28 ENCOUNTER — Telehealth: Payer: Self-pay

## 2014-06-28 NOTE — Progress Notes (Signed)
Dr Milinda Pointer performed a hammertoe repair of 2,3,4 met on right foot (pipj arthroplasty of 2,3,4 met right foot with implant) on 06/26/14

## 2014-06-28 NOTE — Telephone Encounter (Signed)
Left message for patient to call with questions or concerns. 

## 2014-07-01 ENCOUNTER — Ambulatory Visit (INDEPENDENT_AMBULATORY_CARE_PROVIDER_SITE_OTHER): Payer: Medicare Other | Admitting: Podiatry

## 2014-07-01 ENCOUNTER — Ambulatory Visit (INDEPENDENT_AMBULATORY_CARE_PROVIDER_SITE_OTHER): Payer: Medicare Other

## 2014-07-01 VITALS — BP 140/63 | HR 57 | Temp 99.3°F | Resp 16

## 2014-07-01 DIAGNOSIS — Z9889 Other specified postprocedural states: Secondary | ICD-10-CM

## 2014-07-01 DIAGNOSIS — M2041 Other hammer toe(s) (acquired), right foot: Secondary | ICD-10-CM

## 2014-07-01 NOTE — Progress Notes (Signed)
She presents today 6 days postop hammertoe repair #2 #3 and #4 of the right foot. She denies fever chills nausea vomiting muscle aches and pains. She states that she's doing quite well with this.  Objective: Vital signs are stable she is alert and oriented 3. No erythema edema cellulitis drainage or odor once the dry sterile dressing was removed. Margins appear to be well coapted sutures are in place. Radiographic evaluation does demonstrate well-placed arthroplasties.  Assessment: Well-healing surgical foot 6 days after surgery right foot.  Plan: Redressed today with a dry sterile compressive dressing follow-up with her after Thanksgiving for suture removal.

## 2014-07-13 ENCOUNTER — Ambulatory Visit (INDEPENDENT_AMBULATORY_CARE_PROVIDER_SITE_OTHER): Payer: Medicare Other | Admitting: Podiatry

## 2014-07-13 VITALS — BP 132/60 | HR 62 | Resp 16

## 2014-07-13 DIAGNOSIS — Z9889 Other specified postprocedural states: Secondary | ICD-10-CM

## 2014-07-13 NOTE — Progress Notes (Signed)
She presents today a little more than 2 weeks status post hammertoe repair #2 #3 and #4 of the right foot. She denies fever chills nausea vomiting muscle aches and pains and trauma to the right foot.  Objective: Vital signs are stable she is alert and oriented 3 sutures are intact margins are well coapted was removed margins remain well coapted. She has good range of motion of toes #2 #3 and number for the right foot. Mild edema. No erythema cellulitis drainage or odor.  Assessment: Well-healing surgical toes 2 weeks status post arthroplasties PIPJ second third and fourth digits right foot.  Plan: Redress today with compression dressing sutures were removed and she will start soaking her right foot in Epsom salts and warm water daily I will follow up with her in 2 weeks.

## 2014-07-17 ENCOUNTER — Other Ambulatory Visit: Payer: Self-pay | Admitting: Cardiovascular Disease

## 2014-07-22 ENCOUNTER — Other Ambulatory Visit: Payer: Self-pay | Admitting: Cardiovascular Disease

## 2014-07-30 ENCOUNTER — Telehealth: Payer: Self-pay

## 2014-07-30 ENCOUNTER — Telehealth: Payer: Self-pay | Admitting: *Deleted

## 2014-07-30 MED ORDER — MUPIROCIN 2 % EX OINT: TOPICAL_OINTMENT | CUTANEOUS | Status: DC

## 2014-07-30 NOTE — Telephone Encounter (Signed)
SPOKE WITH PT LETTING HER KNOW PER DR HYATT CONTINUE WITH EPSOM SALT SOAKS, WILL CALL IN BACTROBAN OINTMENT TO [facility] [address]. PT SAID SHE COULD NOT USE A BANDAID OR GAUZE TO HER SKIN BECAUSE HER SKIN TEARS. TOLD PT TO PURCHASE TELFA PADS AND COFLEX. PT UNDERSTOOD.

## 2014-07-30 NOTE — Telephone Encounter (Signed)
Pt called stating that she wanted to know if Dr Milinda Pointer could look at her foot today

## 2014-07-30 NOTE — Telephone Encounter (Signed)
Call her bactroban ointment.  Soak in epsom salts and apply to wound daily.  Tell her to wrap the toe and fu with Dr. Jacqualyn Posey next Tuesday or me Wednesday.

## 2014-07-30 NOTE — Telephone Encounter (Signed)
Pt called and said her 2nd toe rt foot (surgery toe) is not open, its red and angry looking and its sore. Pt is wearing a darco shoe and says its rubbing the toe causing the problem. Stated she is using mole skin. Do you want to call her in something or maybe suggest a cream for her?

## 2014-08-05 ENCOUNTER — Ambulatory Visit (INDEPENDENT_AMBULATORY_CARE_PROVIDER_SITE_OTHER): Payer: Medicare Other | Admitting: Podiatry

## 2014-08-05 ENCOUNTER — Ambulatory Visit (INDEPENDENT_AMBULATORY_CARE_PROVIDER_SITE_OTHER): Payer: Medicare Other

## 2014-08-05 ENCOUNTER — Encounter: Payer: Self-pay | Admitting: Podiatry

## 2014-08-05 DIAGNOSIS — M2041 Other hammer toe(s) (acquired), right foot: Secondary | ICD-10-CM

## 2014-08-05 DIAGNOSIS — Z9889 Other specified postprocedural states: Secondary | ICD-10-CM

## 2014-08-05 NOTE — Progress Notes (Signed)
Leah Hayden presents today for follow-up of her second third and fourth arthroplasties right foot. She states that she is unable to get into a regular pair shoes as of yet and continues to wear the Darco shoe. She denies fever chills nausea vomiting muscle aches and pains.  Objective: Vital signs are stable she is alert and oriented 3. Mild hammertoe deformities are present after arthroplasties. Mild erythema and no edema cellulitis drainage or odor to the rest of the foot however the toes are mildly edematous.  Assessment: Well-healing surgical toes 2 and 3 and 4 right foot.  Plan: Continue to wrap the toes with Coban and daily extend out of work for 2 more weeks and continue to try to get back into regular pair shoes.

## 2014-08-10 ENCOUNTER — Encounter: Payer: Medicare Other | Admitting: Podiatry

## 2014-08-19 ENCOUNTER — Encounter: Payer: Medicare Other | Admitting: Podiatry

## 2014-08-24 ENCOUNTER — Ambulatory Visit (INDEPENDENT_AMBULATORY_CARE_PROVIDER_SITE_OTHER): Payer: Medicare Other | Admitting: Podiatry

## 2014-08-24 ENCOUNTER — Ambulatory Visit (INDEPENDENT_AMBULATORY_CARE_PROVIDER_SITE_OTHER): Payer: Self-pay

## 2014-08-24 VITALS — BP 140/68 | HR 60 | Resp 16

## 2014-08-24 DIAGNOSIS — M2041 Other hammer toe(s) (acquired), right foot: Secondary | ICD-10-CM

## 2014-08-24 DIAGNOSIS — M79674 Pain in right toe(s): Secondary | ICD-10-CM | POA: Diagnosis not present

## 2014-08-24 DIAGNOSIS — B351 Tinea unguium: Secondary | ICD-10-CM

## 2014-08-24 DIAGNOSIS — Z9889 Other specified postprocedural states: Secondary | ICD-10-CM

## 2014-08-24 NOTE — Progress Notes (Signed)
She presents today status post hammertoe repair #2 #3 number for the right foot. She states that she has not been soaking or wrapping the toes on regular basis.  Objective: Vital signs are stable she is alert and oriented 3. Pulses appear to be intact. Toes #2 #3 #4 mildly edematous. Mild contracture of the metatarsophalangeal joint dorsally at the second metatarsophalangeal joint right foot. Much decreased from previous evaluation.  Assessment: Status post hammertoe repair #2 #3 and #4 right foot.  Plan: Continue wrapping the toes and massage therapy. I will follow up with her as needed.

## 2014-09-10 ENCOUNTER — Encounter: Payer: Self-pay | Admitting: Internal Medicine

## 2014-09-10 ENCOUNTER — Ambulatory Visit (INDEPENDENT_AMBULATORY_CARE_PROVIDER_SITE_OTHER): Payer: Medicare Other | Admitting: Internal Medicine

## 2014-09-10 VITALS — BP 130/68 | HR 64 | Temp 97.8°F | Ht 68.0 in | Wt 143.0 lb

## 2014-09-10 DIAGNOSIS — L299 Pruritus, unspecified: Secondary | ICD-10-CM

## 2014-09-10 NOTE — Patient Instructions (Signed)
Please try fexofenadine 180mg  daily at bedtime for the itching. If this persists, set up an appointment for Dr Donneta Romberg to check you out.

## 2014-09-10 NOTE — Progress Notes (Signed)
Subjective:    Patient ID: Leah Hayden, female    DOB: 1927-10-17, 81 y.o.   MRN: [id number]  HPI She is here today due to bad itching  No apparent rash Started on legs---even months ago Got compounded cream--with some help at first. Effect seems to have waned  Noticed in head last week and now bad in neck No new exposures---medications, detergent, soap, etc She had stopped her calcium for a while--then home nurse told her to restart (even at bid)  Current Outpatient Prescriptions on File Prior to Visit  Medication Sig Dispense Refill  . acetaminophen (TYLENOL) 325 MG tablet Take 650 mg by mouth every 6 (six) hours as needed.      Marland Kitchen albuterol (PROVENTIL HFA) 108 (90 BASE) MCG/ACT inhaler Inhale 2 puffs into the lungs every 6 (six) hours as needed.      Marland Kitchen amiodarone (PACERONE) 200 MG tablet TAKE 1 TABLET BY MOUTH DAILY 30 tablet 6  . atorvastatin (LIPITOR) 80 MG tablet Take 1 tablet (80 mg total) by mouth daily. 30 tablet 6  . BISACODYL PO Take by mouth. Every 2 weeks prn    . Docusate Sodium (STOOL SOFTENER) 100 MG capsule Take 100 mg by mouth 2 (two) times daily.      Marland Kitchen ELIQUIS 5 MG TABS tablet TAKE 1 TABLET BY MOUTH TWICE A DAY 60 tablet 6  . erythromycin ophthalmic ointment Place 1 application into both eyes at bedtime.    . Fluticasone-Salmeterol (ADVAIR DISKUS) 100-50 MCG/DOSE AEPB Inhale 1 puff into the lungs every 12 (twelve) hours.      Marland Kitchen HYDROcodone-acetaminophen (NORCO/VICODIN) 5-325 MG per tablet Take 1 tablet by mouth every 6 (six) hours as needed for moderate pain. 30 tablet 0  . mometasone (NASONEX) 50 MCG/ACT nasal spray 2 sprays by Nasal route daily.      . montelukast (SINGULAIR) 10 MG tablet Take 10 mg by mouth daily.      . mupirocin ointment (BACTROBAN) 2 % APPLY TO AFFECTED AREA(S) ONCE DAILY 22 g 1  . triamcinolone lotion (KENALOG) 0.1 %      No current facility-administered medications on file prior to visit.    Allergies  Allergen Reactions  . Other  Rash    oramycin    Past Medical History  Diagnosis Date  . Allergic rhinitis   . Asthma   . HX: breast cancer   . Hx of colonic polyps   . Diverticulosis of colon   . GERD (gastroesophageal reflux disease)   . Meniere's disease   . Osteoarthritis   . Sinus brady-tachy syndrome     a. Post-conversion pause of 7.2 seconds during 11/2012 hospitalization @ [facility].  Marland Kitchen PAF (paroxysmal atrial fibrillation)     a. Eliquis and amio initiated 11/2012 in setting of admission with RVR and NSTEMI  . Biceps tendon tear     a. right->s/p surgical repair winter of 2014.  . NSTEMI (non-ST elevated myocardial infarction)     a. 11/2012 in setting of rapid afib;  b. 11/2012 Cath: nl EF, nonobs CAD->Med Rx;  b. 11/2012 Echo: EF 60-65%, mild LVH, mild MR/TR.  Marland Kitchen Hammer toe of right foot     Past Surgical History  Procedure Laterality Date  . Mastectomy  1984    left   . Mastectomy  1985    right  . Partial hysterectomy  1966    w/ bladder tack  . Correction hammer toe  1997    2nd to left toe  .  Esophagogastroduodenoscopy  10/1999    inflammation only   . Toe surgery  2001/2002    2nd/3rd,left   . Rotator cuff repair  10/09    right   . Abdominal hysterectomy    . Foreign body removal Right 01/02/2013    Procedure: FOREIGN BODY REMOVAL RIGHT INDEX FINGER;  Surgeon: Cammie Sickle., MD;  Location: [facility];  Service: Orthopedics;  Laterality: Right;  . Cardiac catheterization  11/2013    [facility]    Family History  Problem Relation Age of Onset  . Heart failure Mother   . Gout Mother   . Heart disease Mother     heart failure  . Arthritis Mother   . Heart failure Father   . Heart disease Father     heart failure  . Diabetes Maternal Grandmother   . Colon cancer Maternal Grandfather   . Cancer Maternal Grandfather     colon  . Arthritis Brother     History   Social History  . Marital Status: Widowed    Spouse Name: N/A    Number of Children: 2  . Years of  Education: N/A   Occupational History  . 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 Smoking status: Never Smoker   . Smokeless tobacco: Never Used  . Alcohol Use: No  . Drug Use: No  . Sexual Activity: Not on file   Other Topics Concern  . Not on file   Social History Narrative   Now has living will   Sons are health care POAs   Has DNR   No tube feeds if cognitively unaware   Review of Systems Hearing seems to be worse since this AM    Objective:   Physical Exam  Constitutional: She appears well-developed and well-nourished. No distress.  HENT:  TMs obscured with cerumen  Pulmonary/Chest: Effort normal and breath sounds normal. No respiratory distress. She has no wheezes. She has no rales.  Musculoskeletal: She exhibits no edema or tenderness.  Skin:  Has some red spots ---like from scratching (neck, legs) No distinct rash  Psychiatric: She has a normal mood and affect. Her behavior is normal.          Assessment & Plan:

## 2014-09-10 NOTE — Assessment & Plan Note (Signed)
No clear rash No evidence of systemic cause for this Will have her stop vitamins and calcium in case they could be involved Try fexofenadine at night May want to see Dr Donneta Romberg if the itching persists

## 2014-09-10 NOTE — Progress Notes (Signed)
Pre visit review using our clinic review tool, if applicable. No additional management support is needed unless otherwise documented below in the visit note. 

## 2014-09-18 ENCOUNTER — Other Ambulatory Visit: Payer: Self-pay | Admitting: Cardiovascular Disease

## 2014-10-21 ENCOUNTER — Telehealth: Payer: Self-pay | Admitting: *Deleted

## 2014-10-21 ENCOUNTER — Observation Stay: Payer: Self-pay | Admitting: Internal Medicine

## 2014-10-21 DIAGNOSIS — I252 Old myocardial infarction: Secondary | ICD-10-CM | POA: Diagnosis not present

## 2014-10-21 DIAGNOSIS — J45909 Unspecified asthma, uncomplicated: Secondary | ICD-10-CM | POA: Diagnosis not present

## 2014-10-21 DIAGNOSIS — Z9013 Acquired absence of bilateral breasts and nipples: Secondary | ICD-10-CM | POA: Diagnosis not present

## 2014-10-21 DIAGNOSIS — Z86010 Personal history of colonic polyps: Secondary | ICD-10-CM | POA: Diagnosis not present

## 2014-10-21 DIAGNOSIS — Z8249 Family history of ischemic heart disease and other diseases of the circulatory system: Secondary | ICD-10-CM | POA: Diagnosis not present

## 2014-10-21 DIAGNOSIS — F419 Anxiety disorder, unspecified: Secondary | ICD-10-CM | POA: Diagnosis not present

## 2014-10-21 DIAGNOSIS — I10 Essential (primary) hypertension: Secondary | ICD-10-CM | POA: Diagnosis not present

## 2014-10-21 DIAGNOSIS — R0789 Other chest pain: Secondary | ICD-10-CM | POA: Diagnosis not present

## 2014-10-21 DIAGNOSIS — I313 Pericardial effusion (noninflammatory): Secondary | ICD-10-CM | POA: Diagnosis not present

## 2014-10-21 DIAGNOSIS — I480 Paroxysmal atrial fibrillation: Secondary | ICD-10-CM | POA: Diagnosis not present

## 2014-10-21 DIAGNOSIS — Z7901 Long term (current) use of anticoagulants: Secondary | ICD-10-CM | POA: Diagnosis not present

## 2014-10-21 DIAGNOSIS — R531 Weakness: Secondary | ICD-10-CM | POA: Diagnosis not present

## 2014-10-21 DIAGNOSIS — I517 Cardiomegaly: Secondary | ICD-10-CM | POA: Diagnosis not present

## 2014-10-21 DIAGNOSIS — Z853 Personal history of malignant neoplasm of breast: Secondary | ICD-10-CM | POA: Diagnosis not present

## 2014-10-21 DIAGNOSIS — K219 Gastro-esophageal reflux disease without esophagitis: Secondary | ICD-10-CM | POA: Diagnosis not present

## 2014-10-21 DIAGNOSIS — I2510 Atherosclerotic heart disease of native coronary artery without angina pectoris: Secondary | ICD-10-CM | POA: Diagnosis not present

## 2014-10-21 DIAGNOSIS — I482 Chronic atrial fibrillation: Secondary | ICD-10-CM | POA: Diagnosis not present

## 2014-10-21 DIAGNOSIS — K5730 Diverticulosis of large intestine without perforation or abscess without bleeding: Secondary | ICD-10-CM | POA: Diagnosis not present

## 2014-10-21 DIAGNOSIS — I4891 Unspecified atrial fibrillation: Secondary | ICD-10-CM | POA: Diagnosis not present

## 2014-10-21 DIAGNOSIS — Z79899 Other long term (current) drug therapy: Secondary | ICD-10-CM | POA: Diagnosis not present

## 2014-10-21 DIAGNOSIS — J449 Chronic obstructive pulmonary disease, unspecified: Secondary | ICD-10-CM | POA: Diagnosis not present

## 2014-10-21 DIAGNOSIS — I340 Nonrheumatic mitral (valve) insufficiency: Secondary | ICD-10-CM | POA: Diagnosis not present

## 2014-10-21 DIAGNOSIS — E785 Hyperlipidemia, unspecified: Secondary | ICD-10-CM | POA: Diagnosis not present

## 2014-10-21 DIAGNOSIS — M1990 Unspecified osteoarthritis, unspecified site: Secondary | ICD-10-CM | POA: Diagnosis not present

## 2014-10-21 DIAGNOSIS — R001 Bradycardia, unspecified: Secondary | ICD-10-CM | POA: Diagnosis not present

## 2014-10-21 DIAGNOSIS — Z7951 Long term (current) use of inhaled steroids: Secondary | ICD-10-CM | POA: Diagnosis not present

## 2014-10-21 DIAGNOSIS — H8109 Meniere's disease, unspecified ear: Secondary | ICD-10-CM | POA: Diagnosis not present

## 2014-10-21 DIAGNOSIS — R079 Chest pain, unspecified: Secondary | ICD-10-CM | POA: Diagnosis not present

## 2014-10-21 NOTE — Telephone Encounter (Signed)
Spoke w/ pt.  She reports severe chest pain since 2-4 am, is c/o weakness & nausea.  States that her grandson is an EMT and her SBP this am was 180, which is very high for her.  She does not know her HR. Pt is requesting an emergency appt in the office to be evaluated. Advised her to either call 911 or have someone take her to the ED. She is agreeable to this and will have her grandson drive her now to the ED.

## 2014-10-21 NOTE — Telephone Encounter (Signed)
Pt c/o of Chest Pain: STAT if CP now or developed within 24 hours  1. Are you having CP right now? no  2. Are you experiencing any other symptoms (ex. SOB, nausea, vomiting, sweating)? Weak and nauseated   3. How long have you been experiencing CP? 2 am to 4 am. Got up to use the restroom and lasted until 4 am   4. Is your CP continuous or coming and going? Just during that time.   5. Have you taken Nitroglycerin? No. She does not know if she has ever taken it.  ? 180 was her BP 15 min ago

## 2014-10-22 ENCOUNTER — Ambulatory Visit: Payer: Self-pay | Admitting: Cardiovascular Disease

## 2014-10-22 DIAGNOSIS — I4891 Unspecified atrial fibrillation: Secondary | ICD-10-CM | POA: Diagnosis not present

## 2014-10-22 DIAGNOSIS — R079 Chest pain, unspecified: Secondary | ICD-10-CM | POA: Diagnosis not present

## 2014-10-22 DIAGNOSIS — R001 Bradycardia, unspecified: Secondary | ICD-10-CM | POA: Diagnosis not present

## 2014-10-22 DIAGNOSIS — I480 Paroxysmal atrial fibrillation: Secondary | ICD-10-CM | POA: Diagnosis not present

## 2014-10-22 DIAGNOSIS — I10 Essential (primary) hypertension: Secondary | ICD-10-CM | POA: Diagnosis not present

## 2014-10-23 DIAGNOSIS — R079 Chest pain, unspecified: Secondary | ICD-10-CM | POA: Diagnosis not present

## 2014-10-23 DIAGNOSIS — I480 Paroxysmal atrial fibrillation: Secondary | ICD-10-CM | POA: Diagnosis not present

## 2014-10-23 DIAGNOSIS — I10 Essential (primary) hypertension: Secondary | ICD-10-CM | POA: Diagnosis not present

## 2014-10-23 DIAGNOSIS — I4891 Unspecified atrial fibrillation: Secondary | ICD-10-CM | POA: Diagnosis not present

## 2014-10-23 DIAGNOSIS — I340 Nonrheumatic mitral (valve) insufficiency: Secondary | ICD-10-CM | POA: Diagnosis not present

## 2014-10-23 DIAGNOSIS — R001 Bradycardia, unspecified: Secondary | ICD-10-CM | POA: Diagnosis not present

## 2014-10-26 ENCOUNTER — Encounter: Payer: Self-pay | Admitting: Internal Medicine

## 2014-10-26 ENCOUNTER — Ambulatory Visit (INDEPENDENT_AMBULATORY_CARE_PROVIDER_SITE_OTHER): Payer: Medicare Other | Admitting: Internal Medicine

## 2014-10-26 VITALS — BP 144/80 | HR 64 | Temp 98.5°F | Wt 141.8 lb

## 2014-10-26 DIAGNOSIS — R079 Chest pain, unspecified: Secondary | ICD-10-CM | POA: Diagnosis not present

## 2014-10-26 DIAGNOSIS — R634 Abnormal weight loss: Secondary | ICD-10-CM

## 2014-10-26 DIAGNOSIS — I471 Supraventricular tachycardia, unspecified: Secondary | ICD-10-CM

## 2014-10-26 NOTE — Progress Notes (Signed)
Subjective:    Patient ID: Leah Hayden, female    DOB: 01/13/1928, 81 y.o.   MRN: [id number]  HPI Here for hospital follow up  Had chest tightness 7 days ago--had been to work that day Went back to sleep and it got better Then recurred Notified family--- EMTs called and to ER Admitted for 2 days Was seen by Dr Leah Hayden No evidence of MI this time Echo showed normal LV function but mild diastolic dysfunction Amiodarone stopped in case that was causing a problem  No palpitations Breathing is okay No dizziness or syncope---but did trip over concrete parking spot marker and chipped teeth.  Current Outpatient Prescriptions on File Prior to Visit  Medication Sig Dispense Refill  . albuterol (PROVENTIL HFA) 108 (90 BASE) MCG/ACT inhaler Inhale 2 puffs into the lungs every 6 (six) hours as needed.      Marland Kitchen atorvastatin (LIPITOR) 80 MG tablet Take 1 tablet (80 mg total) by mouth daily. 30 tablet 6  . Docusate Sodium (STOOL SOFTENER) 100 MG capsule Take 100 mg by mouth as needed.     Marland Kitchen ELIQUIS 5 MG TABS tablet TAKE 1 TABLET BY MOUTH TWICE A DAY 60 tablet 6  . Fluticasone-Salmeterol (ADVAIR DISKUS) 100-50 MCG/DOSE AEPB Inhale 1 puff into the lungs every 12 (twelve) hours.      Marland Kitchen HYDROcodone-acetaminophen (NORCO/VICODIN) 5-325 MG per tablet Take 1 tablet by mouth every 6 (six) hours as needed for moderate pain. 30 tablet 0  . montelukast (SINGULAIR) 10 MG tablet Take 10 mg by mouth daily.      Marland Kitchen amiodarone (PACERONE) 200 MG tablet TAKE 1 TABLET BY MOUTH DAILY (Patient not taking: Reported on 10/26/2014) 30 tablet 6  . BISACODYL PO Take by mouth. Every 2 weeks prn    . erythromycin ophthalmic ointment Place 1 application into both eyes at bedtime.    . mometasone (NASONEX) 50 MCG/ACT nasal spray 2 sprays by Nasal route daily.      . mupirocin ointment (BACTROBAN) 2 % APPLY TO AFFECTED AREA(S) ONCE DAILY (Patient not taking: Reported on 10/26/2014) 22 g 1  . triamcinolone lotion (KENALOG) 0.1 %       No current facility-administered medications on file prior to visit.    Allergies  Allergen Reactions  . Amiodarone Other (See Comments)    Chest discomfort  . Other Rash    oramycin    Past Medical History  Diagnosis Date  . Allergic rhinitis   . Asthma   . HX: breast cancer   . Hx of colonic polyps   . Diverticulosis of colon   . GERD (gastroesophageal reflux disease)   . Meniere's disease   . Osteoarthritis   . Sinus brady-tachy syndrome     a. Post-conversion pause of 7.2 seconds during 11/2012 hospitalization @ [facility].  Marland Kitchen PAF (paroxysmal atrial fibrillation)     a. Eliquis and amio initiated 11/2012 in setting of admission with RVR and NSTEMI  . Biceps tendon tear     a. right->s/p surgical repair winter of 2014.  . NSTEMI (non-ST elevated myocardial infarction)     a. 11/2012 in setting of rapid afib;  b. 11/2012 Cath: nl EF, nonobs CAD->Med Rx;  b. 11/2012 Echo: EF 60-65%, mild LVH, mild MR/TR.  Marland Kitchen Hammer toe of right foot     Past Surgical History  Procedure Laterality Date  . Mastectomy  1984    left   . Mastectomy  1985    right  . Partial hysterectomy  1966    w/ bladder tack  . Correction hammer toe  1997    2nd to left toe  . Esophagogastroduodenoscopy  10/1999    inflammation only   . Toe surgery  2001/2002    2nd/3rd,left   . Rotator cuff repair  10/09    right   . Abdominal hysterectomy    . Foreign body removal Right 01/02/2013    Procedure: FOREIGN BODY REMOVAL RIGHT INDEX FINGER;  Surgeon: Cammie Sickle., MD;  Location: [place];  Service: Orthopedics;  Laterality: Right;  . Cardiac catheterization  11/2013    [facility]    Family History  Problem Relation Age of Onset  . Heart failure Mother   . Gout Mother   . Heart disease Mother     heart failure  . Arthritis Mother   . Heart failure Father   . Heart disease Father     heart failure  . Diabetes Maternal Grandmother   . Colon cancer Maternal Grandfather   . Cancer  Maternal Grandfather     colon  . Arthritis Brother     History   Social History  . Marital Status: Widowed    Spouse Name: N/A  . Number of Children: 2  . Years of Education: N/A   Occupational History  . Lab Scientist, research (life sciences)   Social History Main Topics  . Smoking status: Never Smoker   . Smokeless tobacco: Never Used  . Alcohol Use: No  . Drug Use: No  . Sexual Activity: Not on file   Other Topics Concern  . 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 No distress.  Neck: Normal range of motion. Neck supple. No thyromegaly present.  Cardiovascular: Normal rate, regular rhythm and normal heart sounds.  Exam reveals no gallop.   No murmur heard. Pulmonary/Chest: Effort normal and breath sounds normal. No respiratory distress. She has no wheezes. She has no rales.  Abdominal: Soft. There is no tenderness.  Musculoskeletal: She exhibits no edema or tenderness.  Lymphadenopathy:    She has no cervical adenopathy.  Psychiatric: She has a normal mood and affect. Her behavior is normal.          Assessment & Plan:

## 2014-10-26 NOTE — Progress Notes (Signed)
Pre visit review using our clinic review tool, if applicable. No additional management support is needed unless otherwise documented below in the visit note. 

## 2014-10-26 NOTE — Assessment & Plan Note (Signed)
No tachycardia Off amiodarone for the a fib--with no recent paroxysms

## 2014-10-26 NOTE — Assessment & Plan Note (Signed)
No MI ?non ischemic Amiodarone stopped  No symptoms since discharge Has cardiology follow up coming up [facility] records reviewed

## 2014-10-26 NOTE — Assessment & Plan Note (Signed)
May do better now off the amiodarone

## 2014-10-29 ENCOUNTER — Encounter: Payer: Self-pay | Admitting: Cardiovascular Disease

## 2014-10-29 ENCOUNTER — Ambulatory Visit (INDEPENDENT_AMBULATORY_CARE_PROVIDER_SITE_OTHER): Payer: Medicare Other | Admitting: Cardiovascular Disease

## 2014-10-29 ENCOUNTER — Telehealth: Payer: Self-pay

## 2014-10-29 VITALS — BP 160/68 | HR 53 | Ht 68.0 in | Wt 144.0 lb

## 2014-10-29 DIAGNOSIS — I471 Supraventricular tachycardia: Secondary | ICD-10-CM

## 2014-10-29 DIAGNOSIS — I480 Paroxysmal atrial fibrillation: Secondary | ICD-10-CM | POA: Diagnosis not present

## 2014-10-29 DIAGNOSIS — R079 Chest pain, unspecified: Secondary | ICD-10-CM | POA: Diagnosis not present

## 2014-10-29 DIAGNOSIS — R030 Elevated blood-pressure reading, without diagnosis of hypertension: Secondary | ICD-10-CM | POA: Diagnosis not present

## 2014-10-29 DIAGNOSIS — R001 Bradycardia, unspecified: Secondary | ICD-10-CM | POA: Insufficient documentation

## 2014-10-29 DIAGNOSIS — R634 Abnormal weight loss: Secondary | ICD-10-CM

## 2014-10-29 DIAGNOSIS — R0989 Other specified symptoms and signs involving the circulatory and respiratory systems: Secondary | ICD-10-CM | POA: Insufficient documentation

## 2014-10-29 DIAGNOSIS — E785 Hyperlipidemia, unspecified: Secondary | ICD-10-CM | POA: Diagnosis not present

## 2014-10-29 MED ORDER — RIVAROXABAN 20 MG PO TABS: 20.0000 mg | ORAL_TABLET | Freq: Every day | ORAL | Status: DC

## 2014-10-29 MED ORDER — FLECAINIDE ACETATE 50 MG PO TABS: 50.0000 mg | ORAL_TABLET | Freq: Two times a day (BID) | ORAL | Status: DC

## 2014-10-29 MED ORDER — FLECAINIDE ACETATE 50 MG PO TABS: 50.0000 mg | ORAL_TABLET | Freq: Once | ORAL | Status: DC

## 2014-10-29 NOTE — Telephone Encounter (Signed)
Spoke with Leah Hayden regarding her heart rate today per Dr. Rockey Situ based on her EKG, her heart rate running a bit low at 53 bpm, Dr. Rockey Situ would like the patient to decrease the Flecainide 50 mg down to one tablet daily at bedtime. The patient also wanted [place] to know that she had taken Eliquis today at 6 am and didn't realize that Dr. Rockey Situ wanted her to wait until tomorrow to take the xarelto, and she took xarelto at 12 noon today, per Dr. Rockey Situ instructed she wait until tomorrow afternoon or evening to take the next xarelto tablet. The patient was instructed to monitor her heart rate and BP as well. The patient understands the instructions provided above and will contact our office if any further questions.

## 2014-10-29 NOTE — Assessment & Plan Note (Signed)
[name] presenting today with her concerned about continued weight loss. She is working long hours at work, also involving herself in significant activities after the workday. Recommended she needs to supplement her diet

## 2014-10-29 NOTE — Assessment & Plan Note (Signed)
Recommended she hold her cholesterol pill for a month or 2 to confirm this is not contributing to her facial rash Could restart if no change in her symptoms

## 2014-10-29 NOTE — Assessment & Plan Note (Signed)
Recently seen in the emergency room and capped in the hospital, blood pressure elevated in the ER though likely from anxiety. Hospitalist started HCTZ but this dropped her blood pressure. Without blood pressure pill, systolic pressure running 120-130. Elevated in the office today secondary to anxiety and rushing.

## 2014-10-29 NOTE — Assessment & Plan Note (Signed)
Recently in the hospital for bradycardia, hypertension. Amiodarone held with mild improvement in her heart rate. We'll start on a very low-dose flecainide to maintain normal sinus rhythm. She reports she is unable to cut the pill and we will suggest she take 50 mg once a day at nighttime

## 2014-10-29 NOTE — Assessment & Plan Note (Addendum)
She is concerned about the rash on her face coming from her blood thinner, eliquis or amiodarone.  We have suggested for a trial she start xarelto 20 mg daily, hold the eliquis. We'll continue to hold the amiodarone We will start very low-dose flecainide 50 mg once a day to maintain normal sinus rhythm, trying to avoid excessive bradycardia. She does not want episodes abate her fibrillation. She has had post conversion pauses up to 7 seconds in the past. If well tolerated, could increase flecainide up to 50 mg twice a day at a later date

## 2014-10-29 NOTE — Progress Notes (Signed)
Patient ID: Leah [facility], female    DOB: [birth year], 81 y.o.   MRN: [id number]  HPI Comments: Leah Hayden is a very pleasant 81 year old woman with a history of SVT as well as pauses on Holter, admitted to the hospital August 2012 with atrial fibrillation and converted shortly after to normal sinus rhythm. At least one additional episode of atrial fibrillation since then.  When she converted from afib to sinus, she had a 7.2 second pause, she was asymptomatic.  She was started on antiarrhythmic therapy in order to prevent recurrent afib and subsequently recurrent post-termination pauses. Eliquis was also started.  History of torn biceps of her right arm at the end of September 2014. She wore a splint for 6 weeks. Seen by Dr. Daylene Katayama in [place].  Cardiac catheterization in 2015 showing no significant CAD She presents for routine followup after recent hospitalization for chest pain  She was seen in the hospital 10/21/2014 for chest pain. Cardiac enzymes negative Laboratory work showing total cholesterol 129, LDL 59, HDL 55, normal renal function She was kept in the hospital for hypertension and rule out, asymptomatic bradycardia. Her amiodarone was initially held for bradycardia although she was asymptomatic. Heart rate was 45 bpm at rest She was started on HCTZ during her hospital course for hypertension on arrival though later this was felt to be secondary to anxiety and her blood pressure was dropping low. She has held the HCTZ at home and blood pressure has run between 814 and 481 systolic.  Echocardiogram 10/21/2014 shows ejection fraction 60-65%, mildly dilated left atrium, otherwise a normal study  She is very concerned in follow-up today of having recurrent atrial fibrillation and needing a pacemaker for pauses. She is tolerating anticoagulation, eliquis concerned about a rash on her face which she's had for the past year She is interested in changing some of her medications to see if this  helps the rash She has not been back to work yet but in the past she has reported needing to work for extra money, for company, to avoid being depressed at home.   EKG on today's visit shows normal sinus rhythm with rate 53 bpm, no significant ST or T-wave changes  Other past medical history In the middle of April 2015, she awoke in the middle of the night with recurrent tachypalps and chest burning.  she called EMS the following morning.  She was found to be in afib with rvr and was taken to [facility] for eval.  There, troponin was elevated.  Initial ECG showed subtle anterolateral ST elevation.  She was initially treated with IV dilt and oral BB with rate improvement and later conversion.  In the setting of NSTEMI, she underwent cath, which showed nonobs CAD and nl LV fxn. F/U echo corroborated nl LV fxn.    Allergies  Allergen Reactions  . Amiodarone Other (See Comments)    Chest discomfort  . Other Rash    oramycin    Outpatient Encounter Prescriptions as of 10/29/2014  Medication Sig  . albuterol (PROVENTIL HFA) 108 (90 BASE) MCG/ACT inhaler Inhale 2 puffs into the lungs every 6 (six) hours as needed.    Leah [facility] Sodium (STOOL SOFTENER) 100 MG capsule Take 100 mg by mouth as needed.   . Fluticasone-Salmeterol (ADVAIR DISKUS) 100-50 MCG/DOSE AEPB Inhale 1 puff into the lungs every 12 (twelve) hours.    Marland Kitchen HYDROcodone-acetaminophen (NORCO/VICODIN) 5-325 MG per tablet Take 1 tablet by mouth every 6 (six) hours as needed for moderate  pain.  . montelukast (SINGULAIR) 10 MG tablet Take 10 mg by mouth daily.    . nitroGLYCERIN (NITROSTAT) 0.4 MG SL tablet Place 0.4 mg under the tongue every 5 (five) minutes as needed for chest pain.  Marland Kitchen triamcinolone lotion (KENALOG) 0.1 %   . atorvastatin (LIPITOR) 80 MG tablet Take 1 tablet (80 mg total) by mouth daily.  Marland Kitchen  ELIQUIS 5 MG TABS tablet TAKE 1 TABLET BY MOUTH TWICE A DAY    Past Medical History  Diagnosis Date  . Allergic rhinitis   . Asthma    . HX: breast cancer   . Hx of colonic polyps   . Diverticulosis of colon   . GERD (gastroesophageal reflux disease)   . Meniere's disease   . Osteoarthritis   . Sinus brady-tachy syndrome     a. Post-conversion pause of 7.2 seconds during 11/2012 hospitalization @ [facility].  Marland Kitchen PAF (paroxysmal atrial fibrillation)     a. Eliquis and amio initiated 11/2012 in setting of admission with RVR and NSTEMI  . Biceps tendon tear     a. right->s/p surgical repair winter of 2014.  . NSTEMI (non-ST elevated myocardial infarction)     a. 11/2012 in setting of rapid afib;  b. 11/2012 Cath: nl EF, nonobs CAD->Med Rx;  b. 11/2012 Echo: EF 60-65%, mild LVH, mild MR/TR.  Marland Kitchen Hammer toe of right foot     Past Surgical History  Procedure Laterality Date  . Mastectomy  1984    left   . Mastectomy  1985    right  . Partial hysterectomy  1966    w/ bladder tack  . Correction hammer toe  1997    2nd to left toe  . Esophagogastroduodenoscopy  10/1999    inflammation only   . Toe surgery  2001/2002    2nd/3rd,left   . Rotator cuff repair  10/09    right   . Abdominal hysterectomy    . Foreign body removal Right 01/02/2013    Procedure: FOREIGN BODY REMOVAL RIGHT INDEX FINGER;  Surgeon: Cammie Sickle., MD;  Location: [facility];  Service: Orthopedics;  Laterality: Right;  . Cardiac catheterization  11/2013    [facility]    Social History  reports that she has never smoked. She has never used smokeless tobacco. She reports that she does not drink alcohol or use illicit drugs.  Family History family history includes Arthritis in her brother and mother; Cancer in her maternal grandfather; Colon cancer in her maternal grandfather; Diabetes in her maternal grandmother; Gout in her mother; Heart disease in her father and mother; Heart failure in her father and mother.     Review of Systems  Constitutional: Negative.   Respiratory: Negative.   Cardiovascular: Negative.   Gastrointestinal:  Negative.   Musculoskeletal: Negative.   Skin: Positive for rash.  Neurological: Negative.   Hematological: Negative.   Psychiatric/Behavioral: The patient is nervous/anxious.   All other systems reviewed and are negative.   BP 160/68 mmHg  Pulse 53  Ht 5\' 8"  (1.727 m)  Wt 144 lb (65.318 kg)  BMI 21.90 kg/m2  Physical Exam  Constitutional: She is oriented to person, place, and time. She appears well-developed and well-nourished.  HENT:  Head: Normocephalic.  Nose: Nose normal.  Mouth/Throat: Oropharynx is clear and moist.  Eyes: Conjunctivae are normal. Pupils are equal, round, and reactive to light.  Neck: Normal range of motion. Neck supple. No JVD present.  Cardiovascular: Normal rate, regular rhythm, S1  normal, S2 normal, normal heart sounds and intact distal pulses.  Exam reveals no gallop and no friction rub.   No murmur heard. Pulmonary/Chest: Effort normal and breath sounds normal. No respiratory distress. She has no wheezes. She has no rales. She exhibits no tenderness.  Abdominal: Soft. Bowel sounds are normal. She exhibits no distension. There is no tenderness.  Musculoskeletal: Normal range of motion. She exhibits no edema or tenderness.  Lymphadenopathy:    She has no cervical adenopathy.  Neurological: She is alert and oriented to person, place, and time. Coordination normal.  Skin: Skin is warm and dry. No rash noted. No erythema.  Psychiatric: She has a normal mood and affect. Her behavior is normal. Judgment and thought content normal.    Assessment and Plan  Nursing note and vitals reviewed.

## 2014-10-29 NOTE — Assessment & Plan Note (Signed)
Recent atypical chest pain symptoms, no further workup needed at this time. Cardiac catheterization last year with no significant CAD

## 2014-10-29 NOTE — Patient Instructions (Addendum)
You are doing well.  For the rash, Change the eliquis to xarelto (only one a day) * don't take Eliquis tonight, start Xarelto tomorrow* Hold the amiodarone Start flecainide 50 mg twice a day, to prevent atrial fibrillation Hold the atorvastatin   Watch the rash,  If no improvement, you may want to see dermatology  Please call us if you have new issues that need to be addressed before your next appt.  Your physician wants you to follow-up in: 2 months.

## 2014-10-29 NOTE — Assessment & Plan Note (Signed)
She denies any recent tachycardia. She is now coming off her amiodarone We'll start very low-dose flecainide at her request. She is concerned the amiodarone could cause the rash on her face

## 2014-11-04 ENCOUNTER — Telehealth: Payer: Self-pay | Admitting: Cardiovascular Disease

## 2014-11-04 NOTE — Telephone Encounter (Signed)
Patient has a Quarry manager from insurance.  Patient needs Prior authorization sent to insurance for  Xarelto 20 mg daily.     [phone]  Pharmacy holding until approval and patient has enough until the end of next week.  Please call when sent so patient is aware.

## 2014-11-09 ENCOUNTER — Ambulatory Visit: Payer: Self-pay | Admitting: Cardiovascular Disease

## 2014-11-17 NOTE — Telephone Encounter (Signed)
Notified patient samples of Xarelto 20 mg available to pick up.

## 2014-11-17 NOTE — Telephone Encounter (Signed)
Patient has one Xarelto left for tomorrow.  Pharmacy is still waiting on Prior Auth.  Please call patient to let her know what she should do.

## 2014-11-19 NOTE — Telephone Encounter (Signed)
Notified [facility] Xarelto has been approved through 11/19/2015. Patient is aware of the approval.

## 2014-12-03 ENCOUNTER — Ambulatory Visit (INDEPENDENT_AMBULATORY_CARE_PROVIDER_SITE_OTHER): Payer: Medicare Other | Admitting: Primary Care

## 2014-12-03 ENCOUNTER — Telehealth: Payer: Self-pay

## 2014-12-03 ENCOUNTER — Encounter: Payer: Self-pay | Admitting: Primary Care

## 2014-12-03 VITALS — BP 140/60 | HR 60 | Temp 98.1°F | Ht 68.0 in | Wt 141.1 lb

## 2014-12-03 DIAGNOSIS — L539 Erythematous condition, unspecified: Secondary | ICD-10-CM | POA: Diagnosis not present

## 2014-12-03 DIAGNOSIS — R21 Rash and other nonspecific skin eruption: Secondary | ICD-10-CM

## 2014-12-03 DIAGNOSIS — L218 Other seborrheic dermatitis: Secondary | ICD-10-CM

## 2014-12-03 DIAGNOSIS — L219 Seborrheic dermatitis, unspecified: Secondary | ICD-10-CM

## 2014-12-03 MED ORDER — KETOCONAZOLE 2 % EX SHAM: 1.0000 "application " | MEDICATED_SHAMPOO | CUTANEOUS | Status: DC

## 2014-12-03 MED ORDER — BRIMONIDINE TARTRATE 0.33 % EX GEL: CUTANEOUS | Status: DC

## 2014-12-03 NOTE — Assessment & Plan Note (Signed)
Located to the left upper lateral thigh. Appears to be wheals. Itching. Hydrocortisone cream for rash and itching which patient has at home but has not tried. Follow up in 2 weeks for re-evaluation.

## 2014-12-03 NOTE — Assessment & Plan Note (Signed)
Dry skin that resembles seborrheic dermatitis. Skin intact, no obvious rash except for some irritation to occipital region likely related to scratching. Ketoconazole shampoo twice weekly for 4 weeks. Follow up in 2 weeks.

## 2014-12-03 NOTE — Progress Notes (Signed)
Subjective:    Patient ID: [facility], female    DOB: Jan 24, 1928, 81 y.o.   MRN: [id number]  HPI  Leah [facility] is an 81 year old female who presents today with a chief complaint of multiple rashes and puritis to her scalp. The puritis to her scalp has been present for the past year. She can't tell if here is a rash present, but reports itching daily, denies pain, scabbing, bleeding, and hair loss. She has been using the same shampoo for the past 4-5 years without problems. She also reports a rash to her left hip that she noticed this morning with itching and "wepls". Denies pain. She uses dove soap, and has had no recent changes in soaps, detergents, shampoos, etc. She's not been outside in the woods or garden, but has been outside. She also reports burning, redness, scaling, and stinging to her bilateral cheeks for the past several years.   Review of Systems  Constitutional: Negative for fever and chills.  Respiratory: Negative for shortness of breath.   Cardiovascular: Negative for chest pain.  Skin: Positive for rash.       Puritic scalp, puritic rash to left hip.       Past Medical History  Diagnosis Date  . Allergic rhinitis   . Asthma   . HX: breast cancer   . Hx of colonic polyps   . Diverticulosis of colon   . GERD (gastroesophageal reflux disease)   . Meniere's disease   . Osteoarthritis   . Sinus brady-tachy syndrome     a. Post-conversion pause of 7.2 seconds during 11/2012 hospitalization @ [facility].  Marland Kitchen PAF (paroxysmal atrial fibrillation)     a. Eliquis and amio initiated 11/2012 in setting of admission with RVR and NSTEMI  . Biceps tendon tear     a. right->s/p surgical repair winter of 2014.  . NSTEMI (non-ST elevated myocardial infarction)     a. 11/2012 in setting of rapid afib;  b. 11/2012 Cath: nl EF, nonobs CAD->Med Rx;  b. 11/2012 Echo: EF 60-65%, mild LVH, mild MR/TR.  Marland Kitchen Hammer toe of right foot     History   Social History  . Marital Status: Widowed    Spouse  Name: N/A  . Number of Children: 2  . Years of Education: N/A   Occupational History  . Lab Scientist, research (life sciences)   Social History Main Topics  . Smoking status: Never Smoker   . Smokeless tobacco: Never Used  . Alcohol Use: No  . Drug Use: No  . Sexual Activity: Not on file   Other Topics Concern  . Not on file   Social History Narrative   Now has living will   Sons are health care POAs   Has DNR   No tube feeds if cognitively unaware    Past Surgical History  Procedure Laterality Date  . Mastectomy  1984    left   . Mastectomy  1985    right  . Partial hysterectomy  1966    w/ bladder tack  . Correction hammer toe  1997    2nd to left toe  . Esophagogastroduodenoscopy  10/1999    inflammation only   . Toe surgery  2001/2002    2nd/3rd,left   . Rotator cuff repair  10/09    right   . Abdominal hysterectomy    . Foreign body removal Right 01/02/2013    Procedure: FOREIGN BODY REMOVAL RIGHT INDEX FINGER;  Surgeon: Cammie Sickle., MD;  Location: [facility];  Service: Orthopedics;  Laterality: Right;  . Cardiac catheterization  11/2013    [facility]    Family History  Problem Relation Age of Onset  . Heart failure Mother   . Gout Mother   . Heart disease Mother     heart failure  . Arthritis Mother   . Heart failure Father   . Heart disease Father     heart failure  . Diabetes Maternal Grandmother   . Colon cancer Maternal Grandfather   . Cancer Maternal Grandfather     colon  . Arthritis Brother     Allergies  Allergen Reactions  . Amiodarone Other (See Comments)    Chest discomfort  . Other Rash    oramycin    Current Outpatient Prescriptions on File Prior to Visit  Medication Sig Dispense Refill  . albuterol (PROVENTIL HFA) 108 (90 BASE) MCG/ACT inhaler Inhale 2 puffs into the lungs every 6 (six) hours as needed.      . flecainide (TAMBOCOR) 50 MG tablet Take 1 tablet (50 mg total) by mouth once. 60 tablet 6  .  Fluticasone-Salmeterol (ADVAIR DISKUS) 100-50 MCG/DOSE AEPB Inhale 1 puff into the lungs every 12 (twelve) hours.      . montelukast (SINGULAIR) 10 MG tablet Take 10 mg by mouth daily.      . nitroGLYCERIN (NITROSTAT) 0.4 MG SL tablet Place 0.4 mg under the tongue every 5 (five) minutes as needed for chest pain.    . rivaroxaban (XARELTO) 20 MG TABS tablet Take 1 tablet (20 mg total) by mouth daily with supper. 30 tablet 6  . triamcinolone lotion (KENALOG) 0.1 %     . HYDROcodone-acetaminophen (NORCO/VICODIN) 5-325 MG per tablet Take 1 tablet by mouth every 6 (six) hours as needed for moderate pain. (Patient not taking: Reported on 12/03/2014) 30 tablet 0   No current facility-administered medications on file prior to visit.    BP 140/60 mmHg  Pulse 60  Temp(Src) 98.1 F (36.7 C) (Oral)  Ht 5\' 8"  (1.727 m)  Wt 141 lb 1.9 oz (64.012 kg)  BMI 21.46 kg/m2  SpO2 96%    Objective:   Physical Exam  Constitutional: She is oriented to person, place, and time. She appears well-developed.  Neck: Neck supple.  Cardiovascular: Normal rate and regular rhythm.   Pulmonary/Chest: Effort normal and breath sounds normal.  Lymphadenopathy:    She has no cervical adenopathy.  Neurological: She is alert and oriented to person, place, and time.  Skin: Skin is warm and dry. Rash noted. Rash is urticarial.     Erythema noted to cheeks bilaterally with mild scaling. Suspect rosacea .  Uritaric rash to left hip as noted in drawing, skin intact.  Dry skin noted to scalp with redness to occipital area. Skin intact.          Assessment & Plan:  Rosacea: (erythematotelangiectatic)  Present to bilateral cheeks with erythema, burning/stinging, scaling for several years. Attempted to prescribe a cream, but this was too expensive. We will first treat the 2 other rash complaints and then perhaps refer to dermatology if no improvement.

## 2014-12-03 NOTE — Telephone Encounter (Signed)
Spoke with Leah [facility] and told her not to worry about the medication. We will take care of her itchy scalp and rash to the left leg first. She verbalized understanding.

## 2014-12-03 NOTE — Patient Instructions (Addendum)
For your scalp: Ketoconazole Shampoo: Apply to wet hair, leave on for 5 minutes, then rinse. Do this twice weekly for 4 weeks for itching.  For your cheeks: Brimonidine Gel: Apply thin layer across cheeks once daily. Wash hands immediately after application. Avoid lips, eyes, and open wounds.   For your hip: Hydrocortisone cream over the counter. Apply as directed.  Follow up in 2 weeks for re-evaluation.

## 2014-12-03 NOTE — Telephone Encounter (Signed)
Pt left v/m; pt seen earlier today and brimonidine tartrate gel was $470.00; pt did not get med and wants to know if it is OK with Allie Bossier NP to leave this alone for now and deal with other problems. Pt request cb.

## 2014-12-04 ENCOUNTER — Telehealth: Payer: Self-pay | Admitting: Internal Medicine

## 2014-12-04 MED ORDER — METRONIDAZOLE 0.75 % EX GEL: 1.0000 "application " | Freq: Two times a day (BID) | CUTANEOUS | Status: DC

## 2014-12-04 NOTE — Telephone Encounter (Signed)
Pt left v/m; pt has spoke with Allie Bossier NP but pt wants to know what plan B is for her face; pt request cb.

## 2014-12-04 NOTE — Telephone Encounter (Signed)
Please notify Leah [facility]den that I have sent some Metronidazole gel to her pharmacy for her face. She will gently cleanse her face with warm water and apply the gel twice daily. She may also use a mild skin moisturizer once the medication has absorbed. Avoid the eyes, lips, or open wounds. Wash hands after application.   Please have her call me if she has any questions. I'll see her in 2 weeks.  Thanks!

## 2014-12-04 NOTE — Telephone Encounter (Addendum)
Called and notified the patient of Kate's comments. Patient is worry about the price on the metronidazole gel as well. So [facility] to [facility] is $48.00 I have also let Anda Kraft know the cost of the gel. She stated that if patient did not want the gel, Anda Kraft can referral her to dermatology.   Called the patient back and notified  her know the price of the gel and if she wanted a referral. Patient stated she will try the gel. Declined the referral. Patient will see Anda Kraft in 2 weeks to re-eval.

## 2014-12-04 NOTE — Op Note (Signed)
PATIENT NAME:  Leah [facility], Leah Hayden MR#:  [id number] DATE OF BIRTH:  07-30-1928  DATE OF PROCEDURE:  11/20/2012  PREOPERATIVE DIAGNOSIS:  Senile cataract right eye.  POSTOPERATIVE DIAGNOSIS:  Senile cataract right eye.  PROCEDURE:  Phacoemulsification with posterior chamber intraocular lens implantation of the right eye.  LENS:  SN60WF 20.0-diopter posterior chamber intraocular lens.  ULTRASOUND TIME:  13% of 1 minute, 15 seconds.  CDE 10.0.  SURGEON:  Mali Brasington, MD  ANESTHESIA:  Topical with tetracaine drops and 2% Xylocaine jelly.  COMPLICATIONS:  None.  DESCRIPTION OF PROCEDURE:  The patient was identified in the holding room and transported to the operating room and placed in the supine position under the operating microscope.  The right eye was identified as the operative eye and it was prepped and draped in the usual sterile ophthalmic fashion.  A 1 millimeter clear-corneal paracentesis was made at the 12 o'clock position.  The anterior chamber was filled with Viscoat viscoelastic.  A 2.4 millimeter keratome was used to make a near-clear corneal incision at the 9 o'clock position.  A curvilinear capsulorrhexis was made with a cystotome and capsulorrhexis forceps.  Balanced salt solution was used to hydrodissect and hydrodelineate the nucleus.  Phacoemulsification was then used in stop and chop fashion to remove the lens nucleus and epinucleus.  The remaining cortex was then removed using the irrigation and aspiration handpiece. Provisc was then placed into the capsular bag to distend it for lens placement.  A SN60WF 20.0-diopter lens was then injected into the capsular bag.  The remaining viscoelastic was aspirated.  Wounds were hydrated with balanced salt solution.  The anterior chamber was inflated to a physiologic pressure with balanced salt solution.  0.1 mL of cefuroxime 10 mg/mL were injected into the anterior chamber for a dose of 1 mg of intracameral antibiotic at the completion  of the case.  No wound leaks were noted.  Topical Vigamox drops, timolol drops, and Maxitrol ointment were applied to the eye.  The patient was taken to the recovery room in stable condition without complications of anesthesia or surgery.    ____________________________ Wyonia Hough, MD crb:ms D: 11/20/2012 15:09:27 ET T: 11/20/2012 23:01:02 ET JOB#: [id number]  cc: Wyonia Hough, MD, <Dictator> Leandrew Koyanagi MD ELECTRONICALLY SIGNED 11/27/2012 11:25

## 2014-12-04 NOTE — Telephone Encounter (Signed)
Error

## 2014-12-05 NOTE — Consult Note (Signed)
General Aspect 81 y/o female with a h/o paf and psvt who presented to the ED this AM after awakening last night with tachypalps and chest pain.   Present Illness 81 y/o female with a prior h/o PAF prev on prn amio and propranolol.  She also has a h/o psvt and pauses noted on holter monitoring in the past.  She has no prior h/o CAD, CHF, DM, Stroke, or HTN.  She was on coumadin in the past, but has been on asa 81 for some time now.  She lives by herself and generally does pretty well.  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  She was last seen in clinic earlier this month and was doing well w/o recurrent paf or svt.  She says that her amio was out of date at that visit and was not refilled.  She was in her USOH until last night at about 11:30 PM when she awoke with sudden onset of tachypalpitations and chest burning.  She felt that she was likely in afib but had never had chest burning before.  She thought that it was esophageal in nature.  She says she would have taken an amio tablet but bc they were out of date and not refilled, she did not take anything.  She noted mild nausea w/o dyspnea or diaphoresis.  After about 2 hrs, she felt somewhat better and was able to doze off but then awoke again after 3AM with worsened chest discomfort.  This persisted the remainder of the night/early morning and she contacted our office first thing this morning to arrange f/u today.  An appt was given for 1:15 but b/c she cont to feel poorly, she called EMS.  Upon arrival, she was found to be in afib rvr.  She apparently did not mention c/p to EMTs.  She was taken to [facility] ED where initial ecg did show subtle antlat ST elev.  She became pain free shortly after arrival w/o intervention.  Currently w/o complaints.  She remains in afib with rates in the 80's to low 100's but does not feel it.  Trop 0.15.  Family History  Father died at 53 with a h/o CAD and CHF. Mother died @ 17 with a h/o  CAD. One brother died in a tractor accident while another died of leukemia.  Social History  Lives by herself outside of [place].  She is active around the house. No tob/etoh/drugs. Retired as of Dec 2014.   Physical Exam:  GEN pleasant, nad.   HEENT pink conjunctivae, moist oral mucosa   NECK supple  No masses  no bruits/jvd.   RESP normal resp effort   CARD Irregular rate and rhythm  Normal, S1, S2  No murmur   ABD denies tenderness  soft  normal BS   LYMPH negative neck   EXTR negative cyanosis/clubbing, negative edema, radial/dp's 2+ bilat.  No fem bruits.   SKIN normal to palpation   NEURO follows commands, motor/sensory function intact   PSYCH alert, A+O to time, place, person, good insight   Review of Systems:  Skin: No Complaints   ENT: No Complaints   Eyes: No Complaints   Neck: No Complaints   Respiratory: No Complaints   Cardiovascular: Chest pain or discomfort  Tightness  Palpitations   Gastrointestinal: Heartburn  Nausea   Genitourinary: No Complaints   Vascular: No Complaints   Musculoskeletal: No Complaints   Neurologic: No Complaints   Review of Systems: All  other systems were reviewed and found to be negative   Medications/Allergies Reviewed Medications/Allergies reviewed  allergic to an antibiotic that she took in the 1950's.     Sinus Pauses:    PSVT:    Paroxysmal Afib:    Breast Cancer:    Asthma:    Hysterectomy - Partial:    Rotator Cuff Surgery: right   Mastectomy: BL  Home Medications: Medication Instructions Status  Advair Diskus 100 mcg-50 mcg inhalation powder 1 puff(s) inhaled 2 times a day Active  montelukast 10 mg oral tablet 1 tab(s) orally once a day (in the evening) Active  Proventil HFA CFC free 90 mcg/inh inhalation aerosol 2 puff(s) inhaled 4 times a day, As Needed Active   Lab Results:  Thyroid:  16-Apr-15 10:08   Thyroid Stimulating Hormone 1.45 (0.45-4.50 (International Unit)   ----------------------- Pregnant patients have  different reference  ranges for TSH:  - - - - - - - - - -  Pregnant, first trimetser:  0.36 - 2.50 uIU/mL)  Hepatic:  16-Apr-15 10:08   Bilirubin, Total 0.8  Alkaline Phosphatase 81 (45-117 NOTE: New Reference Range 07/04/13)  SGPT (ALT) 22  SGOT (AST) 22  Total Protein, Serum 7.1  Albumin, Serum 3.5  Routine Chem:  16-Apr-15 10:08   Glucose, Serum  117  BUN  23  Creatinine (comp) 0.60  Sodium, Serum 142  Potassium, Serum 4.4  Chloride, Serum  110  CO2, Serum 29  Calcium (Total), Serum 8.8  Osmolality (calc) 288  eGFR (African American) >60  eGFR (Non-African American) >60 (eGFR values <41mL/min/1.73 m2 may be an indication of chronic kidney disease (CKD). Calculated eGFR is useful in patients with stable renal function. The eGFR calculation will not be reliable in acutely ill patients when serum creatinine is changing rapidly. It is not useful in  patients on dialysis. The eGFR calculation may not be applicable to patients at the low and high extremes of body sizes, pregnant women, and vegetarians.)  Anion Gap  3  Magnesium, Serum 2.1 (1.8-2.4 THERAPEUTIC RANGE: 4-7 mg/dL TOXIC: > 10 mg/dL  -----------------------)  Result Comment Troponin - RESULTS VERIFIED BY REPEAT TESTING.  - called to [place] @ 0626  - on 11/27/13-tms  - READ-BACK PROCESS PERFORMED.  Result(s) reported on 27 Nov 2013 at 10:58AM.  Cardiac:  16-Apr-15 10:08   Troponin I  0.15 (0.00-0.05 0.05 ng/mL or less: NEGATIVE  Repeat testing in 3-6 hrs  if clinically indicated. >0.05 ng/mL: POTENTIAL  MYOCARDIAL INJURY. Repeat  testing in 3-6 hrs if  clinically indicated. NOTE: An increase or decrease  of 30% or more on serial  testing suggests a  clinically important change)  Routine Hem:  16-Apr-15 10:08   WBC (CBC) 5.9  RBC (CBC) 4.78  Hemoglobin (CBC) 14.6  Hematocrit (CBC) 44.9  Platelet Count (CBC) 201 (Result(s) reported on 27 Nov 2013 at 10:27AM.)  MCV 94  MCH 30.5  MCHC 32.5  RDW 13.1   EKG:  EKG Interp. by me   Interpretation afib, 119, poor r prog, left axis, <34mm ST elev V2-V5, twi v6 (new); f/u echg: afib 98, slightly less pronounced St elev (pt pain free)   EKG Comparision Changed from  4.1.15   Radiology Results: XRay:    16-Apr-15 10:31, Chest Portable Single View  Chest Portable Single View   REASON FOR EXAM:    Chest pain  COMMENTS:       PROCEDURE: DXR - DXR PORTABLE CHEST SINGLE VIEW  - Nov 27 2013 10:31AM     CLINICAL DATA:  Chest pain, weakness    EXAM:  PORTABLE CHEST - 1 VIEW    COMPARISON:  CTA chest dated 12/24/2009    FINDINGS:  Lungs are clear.  No pleural effusion or pneumothorax.  Mild cardiomegaly.     IMPRESSION:  No evidence of acute cardiopulmonary disease.      Electronically Signed    By: Julian Hy M.D.    On: 11/27/2013 10:40         Verified By: Julian Hy, M.D.,    oral myicin: Unknown   Impression 1.  NSTEMI:  Pt presented to ED this AM after experiencing tachypalps and midsternal chest discomfort throughout the majority of the night.  Initial ECG did show suble antlat ST elev with twi in V6.  She is pain free currently.  F/U ECG shows sl less pronounced ST changes with persistent twi in V6.  Admit, cycle ce, add hepain, nitrate, bb, asa, high potency statin.  Work on rate control for afib.  Provided that troponins cont to trend up, she will require diagnostic cath.  We will likely arrange for this afternoon.  2.  PAF:  Ss started last night at approx 11:30 PM.  Though initially she noted tachypalps, she remains in afib now and is asymptomatic.  ? degree to which she experiences asymptomatic afib.  Add bb for now.  Rates are now 80's to low 100's.  She may require daily amio.  She is not on oral anticoagulation @ home.  CHA2DS2VASc - 3 and likely 4 in setting of #1.  Add heparin for now but following ischemic w/u, she will need to be placed on  long term oral anticoagulation.  If she ends up requiring asa/p2y12 inhibitor for mgmt of ACS, coumadin would likely be best choice over a NOAC.  If she does not require p2y12 inhibitor, would plan on eliquis.  3.  Asthma/Allergies:  inhalers per IM.   Electronic Signatures for Addendum Section:  Kathlyn Sacramento (MD) (Signed Addendum 16-Apr-15 13:15)  The patient was seen and examined. Agree with the above. She presented with prolonged chest pain and palpitations. She was in A-fib with RVR. Improved with Diltiazem. She is chest pain free now. No murmurs by exam.  ECG with subtle anterolateral ST elevation. TnI is mildly elevated.  Recommend: Cardiac cath today (risks, beneftis and alternatives were explained).  Rate control with Metoprolol. Long term anticoagulation is recommended.   Electronic Signatures: Kathlyn Sacramento (MD)  (Signed 16-Apr-15 13:15)  Co-Signer: Impression/Plan Rogelia Mire (NP)  (Signed 16-Apr-15 12:57)  Authored: General Aspect/Present Illness, History and Physical Exam, Review of System, Past Medical History, Home Medications, Labs, EKG , Radiology, Allergies, Impression/Plan   Last Updated: 16-Apr-15 13:15 by Kathlyn Sacramento (MD)

## 2014-12-05 NOTE — Discharge Summary (Signed)
PATIENT NAME:  Leah Hayden, Leah Hayden MR#:  [id number] DATE OF BIRTH:  [birth year]  DATE OF ADMISSION:  11/27/2013 DATE OF DISCHARGE:  11/29/2013  ADMITTING DIAGNOSIS: Atrial fibrillation with rapid ventricular response.   DISCHARGE DIAGNOSES:  1. Atrial fibrillation with rapid ventricular response, which is in sinus rhythm now. A 6-second pause after conversion, asymptomatic. 2. Elevated troponins, suspected non-Q-wave myocardial infarction, type 2, related to rate, status post cardiac catheterization on 11/27/2013 by Dr. Fletcher Anon. No obstructive coronary artery disease noted. Normal left ventricular ejection fraction. Echocardiogram done on this admission revealed mild left ventricular hypertrophy, mild tricuspid regurgitation, as well as mitral regurgitation. Normal right ventricular systolic pressure. 3. Constipation. 4. Generalized weakness. 5. History of asthma. 6. Breast carcinoma, status post mastectomy.   DISCHARGE CONDITION: Stable.   DISCHARGE MEDICATIONS: The patient is to resume her outpatient medications which are: Advair Diskus 100/50 one puff twice daily, montelukast 10 mg p.o. daily, Proventil 2 puffs 4 times daily as needed. New medications: Amiodarone 200 mg p.o. daily, Eliquis 5 mg p.o. twice daily and atorvastatin 80 mg p.o. daily. The patient is not to take aspirin per cardiologist's recommendations.   HOME OXYGEN: None.   DIET: A 2 gram salt, low fat, low cholesterol, regular consistency.   ACTIVITY LIMITATIONS: As tolerated.    FOLLOWUP APPOINTMENTS: Dr. [name] in 2 days after discharge; Dr. Rockey Situ in 2 days after discharge.   PROCEDURE: Cardiac catheterization, 11/27/2013 by Dr. Fletcher Anon. Results as above.   HOSPITAL COURSE: The patient is a 81 year old Caucasian female with past medical history significant for history of paroxysmal atrial fibrillation, who presents to the hospital with complaints of palpitations as well as chest pains. Please refer to Dr. Boykin Reaper admission note  on 11/27/2013. On arrival to the hospital, the patient was complaining of lightheadedness as well as dizziness and palpitations which started the night before admission. She also noted burning sensation in her throat and her stomach. In the Emergency Room, she was noted to be in atrial fibrillation with RVR with a rate of 140. She received Cardizem IV. She was admitted to the hospital for further evaluation. A cardiology consultation was requested and nurse practitioner, Mr. Murray Hodgkins saw the patient in consultation the same day, 11/27/2013. She was given beta blockers since her CHA2DS2-VASc 3 and likely 4, in the setting of paroxysmal atrial fibrillation, she was also started on heparin and felt that she would benefit from long-term anticoagulation. She was noted to have EKG with subtle anterolateral ST elevations and her troponin was also noted to be mildly elevated. Cardiac catheterization was recommended by Dr. Fletcher Anon. The patient was taken to the procedure room on 11/26/2013, and underwent cardiac catheterization. Cardiac catheterization revealed a coronary angiography that demonstrated minor luminal irregularities; however, left ventricular function was normal. Ejection fraction was 65%. Noted mild mitral regurgitation, which was felt to be likely PVC induced. There was also noted a left ventricular apical diverticulum. Long-term anticoagulation was recommended. As noted above, the patient was initially started on beta blockers; however, later on since she converted into sinus rhythm, decision was made to initiate her on amiodarone. She was also advised to continue Eliquis and continue statin, but no aspirin therapy was recommended due to minimal changes in her coronaries. The patient did well with amiodarone and on 11/29/2013, she was felt to be stable to be discharged.   Her vital signs on the day of discharge, temperature was 98.1, pulse was ranging from 56 to 66. Her respiration rate was ranging  from 15 to 20, and blood pressure ranging from 175 systolic to 102 systolic and 58N to 27P diastolic. Oxygen saturations were 95% on room air at rest. The patient was recommended to continue amiodarone to keep her heart rate in sinus rhythm. She was also recommended Eliquis and Lipitor. Because of hyperglycemia which was noted in the hospital, the patient had a hemoglobin A1c that was checked, which was found to be normal at 5.9. As mentioned above, the patient's mild elevation of troponin to 0.15, 0.26, 0.27 during her three cardiac enzyme sets was felt to be due to rate but not necessarily due to acute coronary syndrome. No aspirin was recommended and a statin only was recommended by cardiologist. The patient had echocardiogram done, which revealed mild LVH, as well as mild MR and TR, and normal right ventricular pressures and normal ejection fraction. In regards to generalized weakness, the patient was seen by a physical therapist and did very well. No physical therapy at home was recommended. For her chronic medical problems such as asthma and breast carcinoma, the patient is to continue her outpatient management. She is being discharged in stable condition with the above-mentioned medications and followup.   TIME SPENT: 40 minutes with the patient.   ____________________________ Theodoro Grist, MD rv:dr D: 11/29/2013 16:42:28 ET T: 11/30/2013 03:01:29 ET JOB#: [id number]  cc: Theodoro Grist, MD, <Dictator> Minna Merritts, MD [place] MD ELECTRONICALLY SIGNED 12/13/2013 18:26

## 2014-12-05 NOTE — H&P (Signed)
PATIENT NAME:  Leah Hayden, Leah Hayden MR#:  [id number] DATE OF BIRTH:  Nov 11, 1927  DATE OF ADMISSION:  11/27/2013  PRIMARY CARE PROVIDER: Dr. [name]  PRIMARY CARDIOLOGIST: Dr. Rockey Situ  CHIEF COMPLAINT: Palpitations, chest pain.   HISTORY OF PRESENT ILLNESS: This is an 81 year old Caucasian female patient with history of asthma, atrial fibrillation, and breast cancer who presents to the hospital complaining of palpitations which started yesterday night. The patient was also feeling lightheaded and dizzy. She did have chest pain with these palpitations that she mentions as burning sensation from her throat to her stomach which had resolved prior to presentation to the ER. In the Emergency Room, the patient was found to have atrial fibrillation with heart rate into the 140s, received a dose of Cardizem IV, and the hospitalist team has been consulted to admit the patient as she has elevated troponin and atrial fibrillation. The case has been discussed with Dr. Fletcher Anon regarding the EKG changes who will be seeing the patient.   PAST MEDICAL HISTORY: 1.  Asthma.  2.  Paroxysmal atrial fibrillation.  3.  Left breast cancer status post mastectomy and right cystic mastitis status post mastectomy.  4.  Partial hysterectomy.  5.  Rotator cuff surgery.   SOCIAL HISTORY: The patient lives alone. Does not smoke. No alcohol. No illicit drugs.   CODE STATUS: DNR/DNI.  ALLERGIES: None.   HOME MEDICATIONS: 1.  Advair 100/50 one puff b.i.d.  2.  Albuterol 2 puffs inhaled 4 times a day as needed.  3.  Montelukast 10 mg daily.   REVIEW OF SYSTEMS: CONSTITUTIONAL: Complains of fatigue and weakness.  EYES: No blurred vision, pain or redness.  ENT: No tinnitus, ear pain, hearing loss.  RESPIRATORY: No cough, wheeze, hemoptysis.  CARDIOVASCULAR: Chest pain present. No orthopnea or edema.  GASTROINTESTINAL: No nausea, vomiting, diarrhea, abdominal pain.  GENITOURINARY: No dysuria, hematuria, frequency.  ENDOCRINE: No  polyuria, nocturia, thyroid problems.  HEMATOLOGIC AND LYMPHATIC: No anemia, easy bruising, bleeding.  INTEGUMENTARY: No acne, rash, lesion.  MUSCULOSKELETAL: Does have arthritis.  NEUROLOGIC: No focal numbness, weakness, seizure. PSYCHIATRY: No anxiety or depression.   FAMILY HISTORY: Hypertension.   PHYSICAL EXAMINATION: VITAL SIGNS: Temperature 98.3, pulse initially of 142 and presently at 105, blood pressure 124/71, and saturating 98% on room.  GENERAL: Elderly Caucasian female patient lying in bed, seems comfortable, conversational, cooperative with exam.  PSYCHIATRIC: Alert and oriented x3. Mood and affect appropriate. Judgment intact.  HEENT: Head is normocephalic, atraumatic. Mucosa moist and pink. External ears and nose normal. No pallor. No icterus. Pupils bilaterally equal and reactive to light.  NECK: Supple. No thyromegaly or palpable lymph nodes. Trachea midline. No carotid bruit, JVD.  CARDIOVASCULAR: S1 and S2 without any murmurs. Peripheral pulses 2+. No edema. Heart rate is irregular.  RESPIRATORY: Normal work of breathing. Clear to auscultation on both sides.  GASTROINTESTINAL: Soft abdomen, nontender. Bowel sounds present. No hepatosplenomegaly palpable.  SKIN: Warm and dry. No petechiae, rash, ulcers.  MUSCULOSKELETAL: No joint swelling, redness, or effusion of the large joints. Normal muscle tone.  NEUROLOGIC: Motor strength 5/5 in upper and lower extremities. Sensation to fine touch intact all over. Cranial nerves II through XII intact.  LYMPH: No cervical lymphadenopathy.   DIAGNOSTIC DATA: Lab studies show glucose 117, BUN 23, creatinine 0.6, sodium 142, potassium 4.4, chloride 110. GFR greater than 60. AST, ALT, alkaline phosphatase, and bilirubin normal. Troponin 0.15. TSH 1.45. WBC 5.9, hemoglobin 14.6, platelets 201,000.   EKG shows atrial fibrillation, rate of 119. Does  have minimal ST elevation of less than 1 mm in V3 and V4. Not NSTEMI. The patient has had  similar changes in the past.   Chest x-ray, portable, single view, shows no acute cardiopulmonary disease.   ASSESSMENT AND PLAN:  1.  Atrial fibrillation with rapid ventricular rate. Per patient She has been in normal sinus rhythm for a long time, is on anticoagulation with aspirin, which will be continued at this time. The patient is not on any rate control medications. Will add low-dose metoprolol to keep her rate under control. If she does convert to normal sinus rhythm, her aspirin can be continued. Will not need any other anticoagulation. Will consult cardiology. She does have elevated troponin. This could be secondary to her fast heart rate. Her chest pain has resolved, but need to rule out acute coronary syndrome. I do not think this is non-ST-elevation myocardial infarction at this time. We will check an echocardiogram for any wall motion abnormalities. The patient will be onto a tele floor.  2.  Extreme fatigue secondary to the atrial fibrillation. Should improve if she converts to normal sinus rhythm.  3.  Asthma. Continue albuterol p.r.n.  4.  Deep vein thrombosis prophylaxis with Lovenox.   CODE STATUS: DNR/DNI.   TIME SPENT: Today on this case was 40 minutes.  ____________________________ Leia Alf Sudini, MD srs:sb D: 11/27/2013 11:58:33 ET T: 11/27/2013 12:19:10 ET JOB#: [id number]  cc: Alveta Heimlich R. Darvin Neighbours, MD, <Dictator> Venia Carbon, MD Minna Merritts, MD Neita Carp MD ELECTRONICALLY SIGNED 11/28/2013 12:51

## 2014-12-13 NOTE — H&P (Signed)
PATIENT NAME:  Leah Hayden, WANEK MR#:  [id number] DATE OF BIRTH:  03-24-1928  DATE OF ADMISSION:  10/21/2014  PRIMARY CARE PHYSICIAN: Venia Carbon, M.D.   CARDIOLOGIST: Minna Merritts, MD  CHIEF COMPLAINT: Chest pain.   HISTORY OF PRESENT ILLNESS: This is an 81 year old female, who woke up to go to the bathroom, came back and developed chest pain in the chest, 2 to 3/10 in intensity, a bloated feeling, some nausea, some weakness, total body. Chest pain lasted about 2 hours and went away on its own with rest. She took a nap. She called Dr. Donivan Scull office this morning. They stated that they could not see her in the office until tomorrow and she came to the ER for further evaluation. The fire department came over, found an elevated blood pressure and a low heart rate, and she came to the ER for further evaluation. In the ER, she had a negative troponin, no further chest pain. Hospitalist services were contacted for further evaluation.   Of note, looking back through laboratory and radiological data in the past, she had a negative cardiac catheterization last year. Blood pressure variable in the ER, as high as 010 systolic. I tried to convince that we can follow up as outpatient. The family is more concerned and wanted her to be watched overnight.   PAST MEDICAL HISTORY: History of SVT, then atrial fibrillation, hyperlipidemia, breast cancer status post surgery in the past, asthma.   PAST SURGICAL HISTORY: Right foot hammertoe, hysterectomy, left mastectomy, right mastectomy.   ALLERGIES: No known drug allergies.   SOCIAL HISTORY: No alcohol. No smoking. No drug use. Lives alone. Still working at [employer].   FAMILY HISTORY: Mother died at 38 of an ulcer. Father died at 34 of heart failure.   MEDICATIONS: Include Advair Diskus 100/50 one inhalation twice a day, amiodarone 200 mg daily, Eliquis 5 mg twice a day, atorvastatin 80 mg daily, Singulair 10 mg in the  evening, Proventil 2 puffs 4 times a day as needed.   REVIEW OF SYSTEMS:  CONSTITUTIONAL: Positive for weakness. No fever, chills, or sweats. No weight gain. No weight loss.  EYES: She does not see as well as she used to.  ENMT: No hearing loss. No sore throat. No difficulty swallowing.  CARDIOVASCULAR: Positive for chest pain. No palpitations.  RESPIRATORY: No shortness of breath. No cough. No sputum. No hemoptysis.  GASTROINTESTINAL: Positive for nausea. No vomiting. No abdominal pain. No diarrhea. Positive for constipation. No bright red blood per rectum. No melena.  GENITOURINARY: No burning on urination or hematuria.  MUSCULOSKELETAL: No joint pain.  INTEGUMENT: Did have a rash on her lower extremities.  PSYCHIATRIC: No anxiety or depression.  ENDOCRINE: No thyroid problems.  HEMATOLOGIC AND LYMPHATIC: No anemia, no easy bruising or bleeding.   PHYSICAL EXAMINATION:  VITAL SIGNS: Temperature 98.1, pulse 51, respirations 15, blood pressure 187/68, pulse oximetry 98% on room air.  GENERAL: No respiratory distress.  EYES: Conjunctivae and lids normal. Pupils equal, round, and reactive to light. Extraocular muscles intact. No nystagmus.  ENMT: Tympanic membranes: No erythema. Nasal mucosa: No erythema. Throat: No erythema, no exudate seen. Lips and gums: No lesions.  NECK: No JVD. No bruits. No lymphadenopathy. No thyromegaly. No thyroid nodules palpated.  RESPIRATORY: Lungs clear to auscultation. No use of accessory muscles to breathe. No rhonchi, rales, or wheeze heard.  CHEST WALL EXAM: No pain to palpation over chest wall. No masses felt on left side of chest where  the patient was having the pain earlier today.  CARDIOVASCULAR: S1 and S2 irregularly irregular, bradycardic. No gallops, rubs, or murmurs heard. Carotid upstrokes 2+ bilaterally. No bruits. Dorsalis pedis pulses, 2+ bilaterally. No edema of the lower extremity.  ABDOMEN: Soft, nontender. No organomegaly or splenomegaly.  Normoactive bowel sounds. No masses felt.  LYMPHATIC: No lymph nodes in the neck.  MUSCULOSKELETAL: No clubbing, edema. Mild cyanosis.  SKIN: No ulcers seen.  NEUROLOGIC: Cranial nerves II through XII grossly intact. Deep tendon reflexes 1+ bilateral lower extremities.  PSYCHIATRIC: The patient is oriented to person, place, and time.   LABORATORY AND RADIOLOGICAL DATA: Chest x-ray negative.   Troponin negative. PT, INR, and PTT normal range.   White blood cell count 4.4, hemoglobin and hematocrit 13.5 and 42.3, platelet count 175,000. Glucose 111, BUN 19, creatinine 0.82, sodium 142, potassium 4.4, chloride 106, CO2 of 30, calcium 9.2, total bilirubin 1.5. Other liver function tests normal range.   ASSESSMENT AND PLAN: 1. Chest pain, less likely heart disease with a negative cardiac catheterization last year. We will check cardiac enzymes. Admit as observation. Could be secondary to accelerated hypertension.  2. Accelerated hypertension. Since systolic blood pressure is elevated, we will start with hydrochlorothiazide 25 mg stat and daily, and continue to monitor here in the hospital. Family concerned about the blood pressure.  3. Atrial fibrillation with bradycardia. I will hold the amiodarone today and then consider decreasing to 100 mg tomorrow. The case discussed with Dr. Rockey Situ. Heart rate has been in the 50s in the office, not much change from previous.  4. Hyperlipidemia, on atorvastatin.  5. Asthma, on medications. Lungs are clear and respiratory status stable, at this point.  6. The patient is anticoagulated with Eliquis for stroke prevention with atrial fibrillation.   CODE STATUS: The patient is a FULL CODE.   TIME SPENT ON OBSERVATION: 55 minutes.    ____________________________ Tana Conch. Leslye Peer, MD rjw:JT D: 10/21/2014 11:33:30 ET T: 10/21/2014 13:25:34 ET JOB#: [id number]  cc: Tana Conch. Leslye Peer, MD, <Dictator> Venia Carbon, MD Minna Merritts, MD Marisue Brooklyn MD ELECTRONICALLY SIGNED 10/30/2014 12:40

## 2014-12-13 NOTE — Consult Note (Signed)
General Aspect Primary Cardiologist: Dr. Rockey Situ, MD _______________  81 year old female with a history of CAD with history NSTEMI in 11/2013 (cath at that time showed nonobstructive disease, NSTEMI felt to be 2/2 a-fib with RVR), SVT as well as pauses on Holter, PAF on Eliquis and amiodarone, HTN, history of breast cancer, asthma, and GERD who presented to [facility] today with chest pain, elevated blood pressures, and asymptomatic bradycardia.  _____________  Past Medical History:  ?? Allergic rhinitis  ?? ??  ?? Asthma ?? ??  ?? HX: breast cancer  ?? ??  ?? Hx of colonic polyps  ?? ??  ?? Diverticulosis of colon  ?? ??  ?? GERD (gastroesophageal reflux disease) ?? ??  ?? Meniere's disease?? ??  ?? Osteoarthritis ?? ??  ?? Sinus brady-tachy syndrome  ?? a. Post-conversion pause of 7.2 seconds during 11/2012 hospitalization @ [facility].  ?? PAF (paroxysmal atrial fibrillation)  ?? a. Eliquis and amio initiated 11/2012 in setting of admission with RVR and NSTEMI  ?? Biceps tendon tear  ?? a. right->s/p surgical repair winter of 2014.  ?? NSTEMI (non-ST elevated myocardial infarction)  ?? a. 11/2012 in setting of rapid afib; ??b. 11/2012 Cath: nl EF, nonobs CAD->Med Rx; ??b. 11/2012 Echo: EF 60-65%, mild LVH, mild MR/TR.  ?? Hammer toe of right foot ?? __________________   Present Illness 81 year old female with the above problem list who presented to [facility] today with the above CC.  She has history of nonobstructive CAD with cardiac cath being done in 11/2013 in the setting of NSTEMI and a-fib with RVR. Her NSTEMI was felt to be 2/2 a-fib RVR. She awoke at that time with tachy-palpitations and chest burning. Cath at that time showed LM normal, LAD moderately calcified with minor luminal irregs, LCx mildly calcified with minor luminal irregs, RCA was dominant, pRCA 10% stenosis, EF 65%. Echo 11/2013 showed EF 60-65%, mild LVH, trivial pericardial effusion, mild MR/TR, normal RVSP. When she converted from a-fib to  sinus rhythm, she had a 7.2 second pause, she was asymptomatic. She was started on antiarrhythmic therapy (amiodarone) in order to prevent recurrent a-fib and subsequently recurrent post-termination pauses. Eliquis was also started. She has tolerated both medications well. She continues to work regularly. She does microscopy work. She does note difficulty with her vision at times. She does not note any changes with her breathing since starting the amiodarone back in April of 2015. At her last office vist with Dr. Rockey Situ in September 2015 she was doing well and without complaints at that time. Her blood pressure was 140/61 at that time, with a pulse of 51. Pulse review from prior office visit noted in [place] shows rates ranging from the low 50s to 60s. Asymptomatic at those times.   She woke up to void during the middle of the night this morning and noted left sided chest pain that was rated a 2 or 3 out of 10. She did not have any associated symptoms at that time. Later on in the morning she did develop some SOB and nausea. She did not think much of the pain and decided to got back to sleep. She got up around 7 AM and called her two sons to notifiy them of her pain. One of them is an EMT. She was taken to the fire house. Their she was noted to be bradycardic with a pulse in the 40s, asymptomatic. Blood pressure was elevated. She was without any chest pain at that time.  She denied any diaphoresis, vomiting, presycnope, or syncope. She states this did not feel like her typical episodes of a-fib. She was advised to call EMS by the fire department 2/2 her bradycardia. Upon her arrival to [facility] she was noted to be hypertensive with blood pressure in the 387F, systolic. Blood pressure has since improved to the 643P systolic. She was bradycardic with pulse into the 40s, again she was asymptomatic. Troponin was negative x 2 thus far. K+ 4.4 with hemolysis noted. CXR negative. Her amiodarone 200 mg has been held with the plan  to possibly discharge her on a lower dose such as 100 mg daily. The only medication she has taken thus far today prior to her admission was her Elquis. She was also started on HCTZ 25 mg daily. She is currently asymptomatic and resting comfortably in her room watching the [employer] vs [employer] game.   Physical Exam:  GEN well developed, no acute distress   HEENT hearing intact to voice, moist oral mucosa   NECK supple  no JVD   RESP normal resp effort  clear BS   CARD Regular rate and rhythm  Murmur   Murmur Systolic   ABD denies tenderness  soft   LYMPH negative neck   EXTR negative edema, chronic hyperpigmentation along bilateral ankles   SKIN normal to palpation   NEURO motor/sensory function intact   PSYCH alert, A+O to time, place, person, good insight   Review of Systems:  Subjective/Chief Complaint chest pain   General: Fatigue  Weakness   Skin: No Complaints   ENT: No Complaints   Eyes: No Complaints   Neck: No Complaints   Respiratory: Short of breath   Cardiovascular: Chest pain or discomfort  Orthopnea  chronic orthopnea   Gastrointestinal: Nausea   Genitourinary: No Complaints   Vascular: No Complaints   Musculoskeletal: No Complaints   Neurologic: No Complaints   Hematologic: No Complaints   Endocrine: No Complaints   Psychiatric: No Complaints   Review of Systems: All other systems were reviewed and found to be negative   Medications/Allergies Reviewed Medications/Allergies reviewed   Family & Social History:  Family and Social History:  Family History Non-Contributory  Coronary Artery Disease   Social History negative tobacco, negative ETOH, negative Illicit drugs   Place of Living Home     Sinus Pauses:    PSVT:    Paroxysmal Afib:    Atrial Fibrillation:    Breast Cancer:    Asthma:    Hysterectomy - Partial:    Rotator Cuff Surgery: right   Mastectomy: BL         Admit Diagnosis:   CHEST PAIN: Onset Date:  21-Oct-2014, Status: Active, Description: CHEST PAIN  Home Medications: Medication Instructions Status  amiodarone 200 mg oral tablet 1 tab(s) orally once a day Active  apixaban 5 mg oral tablet 1 tab(s) orally 2 times a day Active  atorvastatin 80 mg oral tablet 1 tab(s) orally once a day Active  Advair Diskus 100 mcg-50 mcg inhalation powder 1 puff(s) inhaled 2 times a day Active  montelukast 10 mg oral tablet 1 tab(s) orally once a day (in the evening) Active  Proventil HFA CFC free 90 mcg/inh inhalation aerosol 2 puff(s) inhaled 4 times a day, As Needed Active   Lab Results:  Thyroid:  09-Mar-16 13:41   Thyroid Stimulating Hormone 1.537 (0.350-4.500 NOTE: New Reference Range  10/06/14)  Hepatic:  09-Mar-16 09:41   Bilirubin, Total  1.5 (0.3-1.2 NOTE: New Reference  Range  10/06/14)  Alkaline Phosphatase 72 (38-126 NOTE: New Reference Range  10/06/14)  SGPT (ALT) 27 (14-54 NOTE: New Reference Range  10/06/14)  SGOT (AST) 30 (15-41 NOTE: New Reference Range  10/06/14)  Total Protein, Serum 6.5 (6.5-8.1 NOTE: New Reference Range  10/06/14)  Albumin, Serum 3.9 (3.5-5.0 NOTE: New reference range  10/06/14)  Routine Chem:  09-Mar-16 09:41   Glucose, Serum  111 (65-99 NOTE: New Reference Range  10/06/14)  BUN 19 (6-20 NOTE: New Reference Range  10/06/14)  Creatinine (comp) 0.82 (0.44-1.00 NOTE: New Reference Range  10/06/14)  Sodium, Serum 142 (135-145 NOTE: New Reference Range  10/06/14)  Potassium, Serum 4.4 (3.5-5.1 NOTE: New Reference Range  10/06/14)  Chloride, Serum 106 (101-111 NOTE: New Reference Range  10/06/14)  CO2, Serum 30 (22-32 NOTE: New Reference Range  10/06/14)  Calcium (Total), Serum 9.2 (8.9-10.3 NOTE: New Reference Range  10/06/14)  eGFR (African American) >60  eGFR (Non-African American) >60 (eGFR values <28mL/min/1.73 m2 may be an indication of chronic kidney disease (CKD). Calculated eGFR is useful in patients with stable renal  function. The eGFR calculation will not be reliable in acutely ill patients when serum creatinine is changing rapidly. It is not useful in patients on dialysis. The eGFR calculation may not be applicable to patients at the low and high extremes of body sizes, pregnant women, and vegetarians.)  Result Comment - K-HEMOLYSIS AT THIS LEVEL MAY AFFECT THE RESULT  Result(s) reported on 21 Oct 2014 at 10:17AM.  Anion Gap  6  Cardiac:  09-Mar-16 09:41   Troponin I <0.03 (0.00-0.03 0.03 ng/mL or less: NEGATIVE  Repeat testing in 3-6 hrs  if clinically indicated. >0.03 ng/mL: POTENTIAL  MYOCARDIAL INJURY. Repeat  testing in 3-6 hrs if  clinically indicated. NOTE: An increase or decrease  of 30% or more on serial  testing suggests a  clinically important change NOTE: New Reference Range  10/06/14)  CK, Total 63 (38-234 NOTE: New Reference Range  10/06/14)  CPK-MB, Serum 2.6 (0.5-5.0 NOTE: New Reference Range  10/06/14)    13:41   Troponin I <0.03 (0.00-0.03 0.03 ng/mL or less: NEGATIVE  Repeat testing in 3-6 hrs  if clinically indicated. >0.03 ng/mL: POTENTIAL  MYOCARDIAL INJURY. Repeat  testing in 3-6 hrs if  clinically indicated. NOTE: An increase or decrease  of 30% or more on serial  testing suggests a  clinically important change NOTE: New Reference Range  10/06/14)    17:33   Troponin I <0.03 (0.00-0.03 0.03 ng/mL or less: NEGATIVE  Repeat testing in 3-6 hrs  if clinically indicated. >0.03 ng/mL: POTENTIAL  MYOCARDIAL INJURY. Repeat  testing in 3-6 hrs if  clinically indicated. NOTE: An increase or decrease  of 30% or more on serial  testing suggests a  clinically important change NOTE: New Reference Range  10/06/14)  Routine Coag:  09-Mar-16 09:41   Prothrombin 13.5 (11.4-15.0 NOTE: New Reference Range  09/11/14)  INR 1.0 (INR reference interval applies to patients on anticoagulant therapy. A single INR therapeutic range for coumarins is not optimal for  all indications; however, the suggested range for most indications is 2.0 - 3.0. Exceptions to the INR Reference Range may include: Prosthetic heart valves, acute myocardial infarction, prevention of myocardial infarction, and combinations of aspirin and anticoagulant. The need for a higher or lower target INR must be assessed individually. Reference: The Pharmacology and Management of the Vitamin K  antagonists: the seventh ACCP Conference on Antithrombotic and Thrombolytic Therapy. Chest.2004 Sept:126 (3suppl):  4037-5436. A HCT value >55% may artifactually increase the PT.  In one study,  the increase was an average of 25%. Reference:  "Effect on Routine and Special Coagulation Testing Values of Citrate Anticoagulant Adjustment in Patients with High HCT Values." American Journal of Clinical Pathology 2006;126:400-405.)  Activated PTT (APTT) 26.7 (A HCT value >55% may artifactually increase the APTT. In one study, the increase was an average of 19%. Reference: "Effect on Routine and Special Coagulation Testing Values of Citrate Anticoagulant Adjustment in Patients with High HCT Values." American Journal of Clinical Pathology 2006;126:400-405.)  Routine Hem:  09-Mar-16 09:41   WBC (CBC) 4.4  RBC (CBC) 4.47  Hemoglobin (CBC) 13.5  Hematocrit (CBC) 42.3  Platelet Count (CBC) 175 (Result(s) reported on 21 Oct 2014 at 09:54AM.)  MCV 95  MCH 30.2  MCHC 32.0  RDW 13.3   EKG:  EKG Interp. by me   Interpretation EKG shows  sinus bradycardia, 45 bpm, left axis deviation, incomplete RBBB, TWI III, aVF, V2, V3   Radiology Results: XRay:    09-Mar-16 09:46, Chest Portable Single View  Chest Portable Single View   REASON FOR EXAM:    Chest Pain  COMMENTS:       PROCEDURE: DXR - DXR PORTABLE CHEST SINGLE VIEW  - Oct 21 2014  9:46AM     CLINICAL DATA:  Left-sided chest pressure starting at 2 a.m. this  morning, initial encounter.    EXAM:  PORTABLE CHEST - 1 VIEW    COMPARISON:   11/27/2013.    FINDINGS:  Patient is slightly rotated. Trachea appears slightly deviated to  the left. Heart is enlarged, stable. Lungs appear mildly  hyperinflated but clear. Slight blunting of left costophrenic angle  appears chronic.     IMPRESSION:  No acute findings.      Electronically Signed    By: Lorin Picket M.D.    On: 10/21/2014 10:03         Verified By: Luretha Rued, M.D.,    Cologne/Perfume: Other  oral myicin: Unknown  Vital Signs/Nurse's Notes: **Vital Signs.:   09-Mar-16 12:21  Vital Signs Type Admission  Temperature Temperature (F) 97.6  Celsius 36.4  Temperature Source oral  Pulse Pulse 54  Respirations Respirations 20  Systolic BP Systolic BP 067  Diastolic BP (mmHg) Diastolic BP (mmHg) 66  Mean BP 98  Pulse Ox % Pulse Ox % 100  Pulse Ox Activity Level  At rest  Oxygen Delivery Room Air/ 21 %    Impression 81 year old female with a history of CAD with history NSTEMI in 11/2013 (cath at that time showed nonobstructive disease, NSTEMI felt to be 2/2 a-fib with RVR), SVT as well as pauses on Holter, PAF on Eliquis and amiodarone, HTN, history of breast cancer, asthma, and GERD who presented to [facility] today with chest pain, elevated blood pressures, and asymptomatic bradycardia.   1. Chest pain: -atypical in etiology -Cardiac cath 11 months ago that demonstrated nonobstructive disease -Troponin negative x 2 thus far -Could check an echo given both her concern and the patient's daughter-in-laws concern -Other etiologies include her asthma/allergy medications as they may lead to chest pain -No ischemic evaluation planned at this time given recent nonobstructive cath unless final troponin comes back elevated  2. Accelerated HTN: -Review of Epic blood pressure readings demonstrate well controlled readings -She has been started on HCTZ 25 mg daily Possible anxiety an issue  3. Bradycardia: -Asymptomatic -Improved, heart rate upper 50s to  60 -  Not on any AV nodal blocking agents  -Amiodarone has been held, discussed with patient this will take some time for the medication level to decrease in her system -Patient's daught-in-law has concerns for possible amiodarone toxicity. She has been on this for 11 months. It is unlikely to develop this in that time frame, though possible. CXR clear. She does note vision changes. Will check TSH, advise patient follow up with pulmonolgy and ophthalmology as outpatient.  -Could potentially go back on amiodarone 100 mg daily given her history of a-fib with post conversion pause of 7.2 seconds in 2015  4. PAF: -No objective evidence to say this was a-fib at this time -Amiodarone held as above with possible medication changes prior to discharge -Continue Eliquis  -Would avoid AV nodal blocking agents  5. HLD: -Continue Lipitor 80 mg daily  6. Asthma: -Continue current medications   Electronic Signatures: Rise Mu (PA-C)  (Signed 09-Mar-16 17:04)  Authored: General Aspect/Present Illness, History and Physical Exam, Review of System, Family & Social History, Past Medical History, Home Medications, Labs, EKG , Radiology, Allergies, Vital Signs/Nurse's Notes, Impression/Plan Ida Rogue (MD)  (Signed 09-Mar-16 18:22)  Authored: General Aspect/Present Illness, History and Physical Exam, Review of System, Family & Social History, Health Issues, Labs, EKG , Vital Signs/Nurse's Notes, Impression/Plan  Co-Signer: General Aspect/Present Illness, History and Physical Exam, Review of System, Family & Social History, Past Medical History, Home Medications, Labs, EKG , Radiology, Allergies, Vital Signs/Nurse's Notes, Impression/Plan   Last Updated: 09-Mar-16 18:22 by Ida Rogue (MD)

## 2014-12-13 NOTE — Discharge Summary (Signed)
PATIENT NAME:  Leah Hayden, Leah Hayden MR#:  [id number] DATE OF BIRTH:  1928-05-28  DATE OF ADMISSION:  10/21/2014 DATE OF DISCHARGE:  10/23/2014  DISCHARGE DIAGNOSES:  1. Chest pain secondary to bradycardia.  2. Bradycardia. Now she is off the amiodarone.  3. Chronic atrial fibrillation.  4. Hypertension.  asthma;mild intermittent  DISCHARGE MEDICATIONS:  1. Advair Diskus 500/50 one puffs b.i.d. 2. Singulair 10 mg p.o. daily.  3. Proventil 2 puffs 4 times daily.  4. Apixaban 15 mg p.o. b.i.d.  5. Atorvastatin 80 mg daily. 6. Hydrochlorothiazide 25 mg p.o. daily.  7. Nitroglycerin 0.4 mg sublingual p.r.n. for chest pain.   NEW MEDICATIONS: Nitroglycerin is a new medication, and the patient's hydrochlorothiazide is also a new medication.   DISCONTINUED MEDICATION: Stop the following medication: Amiodarone because of her bradycardia.   CONSULTATIONS: Cardiology consult with Dr. Fletcher Anon.  HOSPITAL COURSE:  1. An 81 year old female patient came in because of the chest pain, 2 to 3 out of 10 in severity, on left side of the chest, lasted about an hour to 2 hours in the middle of the night, so the patient was concerned, and then she called Dr. Donivan Scull office, and she came to the Emergency Room because of pain and did not get an appointment. The patient's troponins have been negative x 3. The patient admitted to hospitalist service for chest pain rule out. She was also found to have bradycardia, heart rate was in the 50s, so she was admitted for chest pain. The patient had a normal cardiac catheter last year and her troponins have been negative. The patient was put in observation status. Her blood pressure was elevated, and the patient's blood pressure was 187/68 on arrival. The patient was started on hydrochlorothiazide because of her accelerated hypertension. Regarding atrial fibrillation and bradycardia: She was on amiodarone 200 mg daily, but she consistently had a heart rate of 50s and then 55, so  amiodarone was completely stopped. Today, heart rate improved to 60s, so Dr. Fletcher Anon recommended to see her in follow up as outpatient regarding restarting amiodarone at a lower dose. For chronic atrial fibrillation, she is on Eliquis. Continue that and atrial fibrillation, right now, heart rate in the 60s, so she will follow up with Dr. Fletcher Anon on Monday, regarding restarting amiodarone at a lower dose.  2. Regarding chest pain, it is noncardiac, and we thought this probably is secondary to bradycardia. Chest pain resolved. She wanted a prescription for nitroglycerin, and we gave the nitroglycerin prescription.   3. Chronic obstructive pulmonary disease. She can continue Singulair and Advair and Proventil.   LABORATORY DATA DURING THE HOSPITAL STAY: White count 5.3, hemoglobin 12.9, hematocrit 40.(, platelets 162,000. The patient's sodium 141, potassium 4, chloride 104, bicarbonate 29, BUN 22, creatinine 1, glucose 123. The patient's echocardiogram showed EF of 60% to 65% with normal LV function and also, the patient found to have impaired relaxation with LV diastolic filling defect.  troponis are negaitve.   PHYSICAL EXAMINATION:  GENERAL: The patient is stable today. CVS;s1,s2 regular.no murmur.. LUNGS: Clear to auscultation.  ABDOMEN: Soft, nontender, nondistended. Bowel sounds present.   DISPOSITION:  The patient was discharged home in stable condition.   TIME SPENT ON DISCHARGE PREPARATION: More than 30 minutes.   I discussed the plan with the patient and the patient's family in detail,     ____________________________ Epifanio Lesches, MD sk:mw D: 10/23/2014 13:28:11 ET T: 10/23/2014 19:48:36 ET JOB#: [id number]  cc: Epifanio Lesches, MD, <Dictator> Muhammad A. Fletcher Anon,  MD Epifanio Lesches MD ELECTRONICALLY SIGNED 10/30/2014 13:44

## 2014-12-15 ENCOUNTER — Ambulatory Visit (INDEPENDENT_AMBULATORY_CARE_PROVIDER_SITE_OTHER): Payer: Medicare Other | Admitting: Primary Care

## 2014-12-15 ENCOUNTER — Encounter: Payer: Self-pay | Admitting: Primary Care

## 2014-12-15 VITALS — BP 160/76 | HR 64 | Temp 98.1°F | Ht 68.0 in | Wt 141.8 lb

## 2014-12-15 DIAGNOSIS — R21 Rash and other nonspecific skin eruption: Secondary | ICD-10-CM

## 2014-12-15 DIAGNOSIS — L218 Other seborrheic dermatitis: Secondary | ICD-10-CM | POA: Diagnosis not present

## 2014-12-15 DIAGNOSIS — L219 Seborrheic dermatitis, unspecified: Secondary | ICD-10-CM

## 2014-12-15 MED ORDER — PREDNISONE 10 MG PO TABS: ORAL_TABLET | ORAL | Status: DC

## 2014-12-15 NOTE — Assessment & Plan Note (Signed)
None present to left hip. Presumed rosacea improved with Metronidazole gel. Continue gel. Follow up with Dermatology as scheduled.

## 2014-12-15 NOTE — Patient Instructions (Addendum)
Continue using the Ketoconazole shampoo twice weekly as discussed. Continue using the Metronidazole gel to your face only. Start Prednisone to help reduce rash and inflammation. Take 3 tablets by mouth for 3 days, then 2 tablets by mouth for 3 days, then 1 tablet by mouth for 3 days. You will be contacted regarding your referral to Dermatology.  Please let us know if you have not heard back within one week.  It was a pleasure meeting you.

## 2014-12-15 NOTE — Progress Notes (Signed)
Pre visit review using our clinic review tool, if applicable. No additional management support is needed unless otherwise documented below in the visit note. 

## 2014-12-15 NOTE — Assessment & Plan Note (Signed)
Scalp appears to be improved, however she continues to develop rash to her posterior neck near hairline. Continue ketoconazole shampoo twice weekly. Low dose prednisone taper to help with remaining rash. Referral made to dermatology for further evaluation.

## 2014-12-15 NOTE — Progress Notes (Signed)
Subjective:    Patient ID: Leah Hayden, female    DOB: 05/29/1928, 81 y.o.   MRN: [id number]  HPI  Ms. Leah Hayden is an 81 year old female who presents today for follow up. She was evaluated 2 weeks ago for seborrheic dermatitis of her scalp and rash present to left lateral hip and face, triamcinolone lotion, and metronidazole gel for her face.   1) Seborrheic dermatitis of scalp: She was provided with a prescription for ketoconazole shampoo to use twice daily for which she reports no improvement. She's using the shampoo twice weekly as directed but will still experience a rash to the base of her hairline at her neck. She is frustrated with the lack of progress to her scalp.  2) [place]: Present to bilateral cheeks. She was provided with a prescription for metronidazole gel to apply twice daily. She does report some improvement with this treatment.   3) Rash: Present to left hip which is now completely healed. She was instructed to apply hydrocortisone cream OTC  Review of Systems  Musculoskeletal: Negative for neck pain.  Skin: Positive for rash.  Neurological: Negative for headaches.       Past Medical History  Diagnosis Date  . Allergic rhinitis   . Asthma   . HX: breast cancer   . Hx of colonic polyps   . Diverticulosis of colon   . GERD (gastroesophageal reflux disease)   . Meniere's disease   . Osteoarthritis   . Sinus brady-tachy syndrome     a. Post-conversion pause of 7.2 seconds during 11/2012 hospitalization @ [facility].  Marland Kitchen PAF (paroxysmal atrial fibrillation)     a. Eliquis and amio initiated 11/2012 in setting of admission with RVR and NSTEMI  . Biceps tendon tear     a. right->s/p surgical repair winter of 2014.  . NSTEMI (non-ST elevated myocardial infarction)     a. 11/2012 in setting of rapid afib;  b. 11/2012 Cath: nl EF, nonobs CAD->Med Rx;  b. 11/2012 Echo: EF 60-65%, mild LVH, mild MR/TR.  Marland Kitchen Hammer toe of right foot     History   Social History  . Marital  Status: Widowed    Spouse Name: N/A  . Number of Children: 2  . Years of Education: N/A   Occupational History  . Lab Scientist, research (life sciences)   Social History Main Topics  . Smoking status: Never Smoker   . Smokeless tobacco: Never Used  . Alcohol Use: No  . Drug Use: No  . Sexual Activity: Not on file   Other Topics Concern  . Not on file   Social History Narrative   Now has living will   Sons are health care POAs   Has DNR   No tube feeds if cognitively unaware    Past Surgical History  Procedure Laterality Date  . Mastectomy  1984    left   . Mastectomy  1985    right  . Partial hysterectomy  1966    w/ bladder tack  . Correction hammer toe  1997    2nd to left toe  . Esophagogastroduodenoscopy  10/1999    inflammation only   . Toe surgery  2001/2002    2nd/3rd,left   . Rotator cuff repair  10/09    right   . Abdominal hysterectomy    . Foreign body removal Right 01/02/2013    Procedure: FOREIGN BODY REMOVAL RIGHT INDEX FINGER;  Surgeon: Cammie Sickle., MD;  Location: [place];  Service: Orthopedics;  Laterality: Right;  . Cardiac catheterization  11/2013    [facility]    Family History  Problem Relation Age of Onset  . Heart failure Mother   . Gout Mother   . Heart disease Mother     heart failure  . Arthritis Mother   . Heart failure Father   . Heart disease Father     heart failure  . Diabetes Maternal Grandmother   . Colon cancer Maternal Grandfather   . Cancer Maternal Grandfather     colon  . Arthritis Brother     Allergies  Allergen Reactions  . Amiodarone Other (See Comments)    Chest discomfort  . Other Rash    oramycin    Current Outpatient Prescriptions on File Prior to Visit  Medication Sig Dispense Refill  . albuterol (PROVENTIL HFA) 108 (90 BASE) MCG/ACT inhaler Inhale 2 puffs into the lungs every 6 (six) hours as needed.      . flecainide (TAMBOCOR) 50 MG tablet Take 1 tablet (50 mg total) by  mouth once. 60 tablet 6  . Fluticasone-Salmeterol (ADVAIR DISKUS) 100-50 MCG/DOSE AEPB Inhale 1 puff into the lungs every 12 (twelve) hours.      Marland Kitchen HYDROcodone-acetaminophen (NORCO/VICODIN) 5-325 MG per tablet Take 1 tablet by mouth every 6 (six) hours as needed for moderate pain. 30 tablet 0  . ketoconazole (NIZORAL) 2 % shampoo Apply 1 application topically 2 (two) times a week. 120 mL 0  . metroNIDAZOLE (METROGEL) 0.75 % gel Apply 1 application topically 2 (two) times daily. 45 g 0  . montelukast (SINGULAIR) 10 MG tablet Take 10 mg by mouth daily.      . nitroGLYCERIN (NITROSTAT) 0.4 MG SL tablet Place 0.4 mg under the tongue every 5 (five) minutes as needed for chest pain.    . rivaroxaban (XARELTO) 20 MG TABS tablet Take 1 tablet (20 mg total) by mouth daily with supper. 30 tablet 6  . triamcinolone lotion (KENALOG) 0.1 %      No current facility-administered medications on file prior to visit.    BP 160/76 mmHg  Pulse 64  Temp(Src) 98.1 F (36.7 C) (Oral)  Ht 5\' 8"  (1.727 m)  Wt 141 lb 12.8 oz (64.32 kg)  BMI 21.57 kg/m2  SpO2 98%    Objective:   Physical Exam  Constitutional: She is oriented to person, place, and time. She appears well-developed.  Cardiovascular: Normal rate and regular rhythm.   Pulmonary/Chest: Effort normal and breath sounds normal.  Neurological: She is alert and oriented to person, place, and time.  Skin: Skin is warm and dry.  Small rash noted to left posterior upper neck near hairline. Scalp appears to be improved. Rosacea appears to be improved with some mild redness present. Rash to left hip has dissipated.           Assessment & Plan:

## 2014-12-17 ENCOUNTER — Ambulatory Visit: Payer: Medicare Other | Admitting: Primary Care

## 2014-12-17 DIAGNOSIS — R21 Rash and other nonspecific skin eruption: Secondary | ICD-10-CM | POA: Diagnosis not present

## 2014-12-18 ENCOUNTER — Ambulatory Visit (INDEPENDENT_AMBULATORY_CARE_PROVIDER_SITE_OTHER): Payer: Medicare Other | Admitting: Primary Care

## 2014-12-18 ENCOUNTER — Encounter: Payer: Self-pay | Admitting: Primary Care

## 2014-12-18 VITALS — BP 138/72 | HR 65 | Temp 98.0°F | Ht 68.0 in | Wt 139.1 lb

## 2014-12-18 DIAGNOSIS — N390 Urinary tract infection, site not specified: Secondary | ICD-10-CM

## 2014-12-18 LAB — POCT URINALYSIS DIPSTICK
Bilirubin, UA: NEGATIVE
Blood, UA: NEGATIVE
Glucose, UA: NEGATIVE
Ketones, UA: NEGATIVE
Nitrite, UA: POSITIVE
Protein, UA: NEGATIVE
Spec Grav, UA: 1.025
Urobilinogen, UA: 4
pH, UA: 6

## 2014-12-18 MED ORDER — CEPHALEXIN 500 MG PO CAPS: 500.0000 mg | ORAL_CAPSULE | Freq: Two times a day (BID) | ORAL | Status: DC

## 2014-12-18 NOTE — Progress Notes (Signed)
Subjective:    Patient ID: Leah Hayden, female    DOB: Aug 11, 1928, 81 y.o.   MRN: [id number]  HPI  Leah Hayden is an 81 year old female who presents today with a chief complaint of foul odor to her urine and some urgency that has been present for several weeks with some bed wetting recently at night. She denies dysuria, frequency, fevers, chills. She has not taken anything OTC for foul urine smell and has been drinking plenty of water. Nothing seems to make symptoms worse or better.  Review of Systems  Constitutional: Negative for fever and chills.  Gastrointestinal: Negative for nausea and vomiting.  Genitourinary: Positive for urgency and difficulty urinating. Negative for dysuria, frequency, hematuria, vaginal discharge and vaginal pain.  Neurological: Negative for weakness.       Past Medical History  Diagnosis Date  . Allergic rhinitis   . Asthma   . HX: breast cancer   . Hx of colonic polyps   . Diverticulosis of colon   . GERD (gastroesophageal reflux disease)   . Meniere's disease   . Osteoarthritis   . Sinus brady-tachy syndrome     a. Post-conversion pause of 7.2 seconds during 11/2012 hospitalization @ [facility].  Marland Kitchen PAF (paroxysmal atrial fibrillation)     a. Eliquis and amio initiated 11/2012 in setting of admission with RVR and NSTEMI  . Biceps tendon tear     a. right->s/p surgical repair winter of 2014.  . NSTEMI (non-ST elevated myocardial infarction)     a. 11/2012 in setting of rapid afib;  b. 11/2012 Cath: nl EF, nonobs CAD->Med Rx;  b. 11/2012 Echo: EF 60-65%, mild LVH, mild MR/TR.  Marland Kitchen Hammer toe of right foot     History   Social History  . Marital Status: Widowed    Spouse Name: N/A  . Number of Children: 2  . Years of Education: N/A   Occupational History  . Lab Scientist, research (life sciences)   Social History Main Topics  . Smoking status: Never Smoker   . Smokeless tobacco: Never Used  . Alcohol Use: No  . Drug Use: No  . Sexual Activity:  Not on file   Other Topics Concern  . Not on file   Social History Narrative   Now has living will   Sons are health care POAs   Has DNR   No tube feeds if cognitively unaware    Past Surgical History  Procedure Laterality Date  . Mastectomy  1984    left   . Mastectomy  1985    right  . Partial hysterectomy  1966    w/ bladder tack  . Correction hammer toe  1997    2nd to left toe  . Esophagogastroduodenoscopy  10/1999    inflammation only   . Toe surgery  2001/2002    2nd/3rd,left   . Rotator cuff repair  10/09    right   . Abdominal hysterectomy    . Foreign body removal Right 01/02/2013    Procedure: FOREIGN BODY REMOVAL RIGHT INDEX FINGER;  Surgeon: Cammie Sickle., MD;  Location: [place];  Service: Orthopedics;  Laterality: Right;  . Cardiac catheterization  11/2013    [facility]    Family History  Problem Relation Age of Onset  . Heart failure Mother   . Gout Mother   . Heart disease Mother     heart failure  . Arthritis Mother   .  Heart failure Father   . Heart disease Father     heart failure  . Diabetes Maternal Grandmother   . Colon cancer Maternal Grandfather   . Cancer Maternal Grandfather     colon  . Arthritis Brother     Allergies  Allergen Reactions  . Amiodarone Other (See Comments)    Chest discomfort  . Other Rash    oramycin    Current Outpatient Prescriptions on File Prior to Visit  Medication Sig Dispense Refill  . albuterol (PROVENTIL HFA) 108 (90 BASE) MCG/ACT inhaler Inhale 2 puffs into the lungs every 6 (six) hours as needed.      . flecainide (TAMBOCOR) 50 MG tablet Take 1 tablet (50 mg total) by mouth once. 60 tablet 6  . Fluticasone-Salmeterol (ADVAIR DISKUS) 100-50 MCG/DOSE AEPB Inhale 1 puff into the lungs every 12 (twelve) hours.      Marland Kitchen HYDROcodone-acetaminophen (NORCO/VICODIN) 5-325 MG per tablet Take 1 tablet by mouth every 6 (six) hours as needed for moderate pain. 30 tablet 0  . ketoconazole (NIZORAL)  2 % shampoo Apply 1 application topically 2 (two) times a week. 120 mL 0  . metroNIDAZOLE (METROGEL) 0.75 % gel Apply 1 application topically 2 (two) times daily. 45 g 0  . montelukast (SINGULAIR) 10 MG tablet Take 10 mg by mouth daily.      . nitroGLYCERIN (NITROSTAT) 0.4 MG SL tablet Place 0.4 mg under the tongue every 5 (five) minutes as needed for chest pain.    . predniSONE (DELTASONE) 10 MG tablet Take 3 tablets by mouth daily for 3 days, then take 2 tablets by mouth for 3 days, then 1 tablet by mouth for 3 days. 18 tablet 0  . rivaroxaban (XARELTO) 20 MG TABS tablet Take 1 tablet (20 mg total) by mouth daily with supper. 30 tablet 6  . triamcinolone lotion (KENALOG) 0.1 %      No current facility-administered medications on file prior to visit.    BP 138/72 mmHg  Pulse 65  Temp(Src) 98 F (36.7 C) (Oral)  Ht 5\' 8"  (1.727 m)  Wt 139 lb 1.9 oz (63.104 kg)  BMI 21.16 kg/m2  SpO2 97%    Objective:   Physical Exam  Constitutional: She is oriented to person, place, and time. She appears well-developed. She does not appear ill.  Neck: Neck supple.  Cardiovascular: Normal rate and regular rhythm.   Pulmonary/Chest: Effort normal and breath sounds normal.  Lymphadenopathy:    She has no cervical adenopathy.  Neurological: She is alert and oriented to person, place, and time.  Skin: Skin is warm and dry.          Assessment & Plan:  Urinary tract infection:  Positive for leuks and nitirites. No blood. Treat with Cephalexin BID for 10 days. Push fluids. Will send culture. Follow up if no improvement in 3-4 days.

## 2014-12-18 NOTE — Progress Notes (Signed)
Pre visit review using our clinic review tool, if applicable. No additional management support is needed unless otherwise documented below in the visit note. 

## 2014-12-18 NOTE — Patient Instructions (Signed)
You have a urinary tract infection. Start taking Cephalexin antibiotics. Take 1 tablet by mouth twice daily for 10 days. We will culture your urine to ensure this is the proper antibiotic. If it is not then I will notify you. Call me if no improvement in the next 3-4 days. It was nice to see you!  Urinary Tract Infection Urinary tract infections (UTIs) can develop anywhere along your urinary tract. Your urinary tract is your body's drainage system for removing wastes and extra water. Your urinary tract includes two kidneys, two ureters, a bladder, and a urethra. Your kidneys are a pair of bean-shaped organs. Each kidney is about the size of your fist. They are located below your ribs, one on each side of your spine. CAUSES Infections are caused by microbes, which are microscopic organisms, including fungi, viruses, and bacteria. These organisms are so small that they can only be seen through a microscope. Bacteria are the microbes that most commonly cause UTIs. SYMPTOMS  Symptoms of UTIs may vary by age and gender of the patient and by the location of the infection. Symptoms in young women typically include a frequent and intense urge to urinate and a painful, burning feeling in the bladder or urethra during urination. Older women and men are more likely to be tired, shaky, and weak and have muscle aches and abdominal pain. A fever may mean the infection is in your kidneys. Other symptoms of a kidney infection include pain in your back or sides below the ribs, nausea, and vomiting. DIAGNOSIS To diagnose a UTI, your caregiver will ask you about your symptoms. Your caregiver also will ask to provide a urine sample. The urine sample will be tested for bacteria and white blood cells. White blood cells are made by your body to help fight infection. TREATMENT  Typically, UTIs can be treated with medication. Because most UTIs are caused by a bacterial infection, they usually can be treated with the use of  antibiotics. The choice of antibiotic and length of treatment depend on your symptoms and the type of bacteria causing your infection. HOME CARE INSTRUCTIONS  If you were prescribed antibiotics, take them exactly as your caregiver instructs you. Finish the medication even if you feel better after you have only taken some of the medication.  Drink enough water and fluids to keep your urine clear or pale yellow.  Avoid caffeine, tea, and carbonated beverages. They tend to irritate your bladder.  Empty your bladder often. Avoid holding urine for long periods of time.  Empty your bladder before and after sexual intercourse.  After a bowel movement, women should cleanse from front to back. Use each tissue only once. SEEK MEDICAL CARE IF:   You have back pain.  You develop a fever.  Your symptoms do not begin to resolve within 3 days. SEEK IMMEDIATE MEDICAL CARE IF:   You have severe back pain or lower abdominal pain.  You develop chills.  You have nausea or vomiting.  You have continued burning or discomfort with urination. MAKE SURE YOU:   Understand these instructions.  Will watch your condition.  Will get help right away if you are not doing well or get worse. Document Released: 05/10/2005 Document Revised: 01/30/2012 Document Reviewed: 09/08/2011 [facility] Patient Information 2015 [place], [place]. This information is not intended to replace advice given to you by your health care provider. Make sure you discuss any questions you have with your health care provider.

## 2014-12-18 NOTE — Addendum Note (Signed)
Addended by: Jacqualin Combes on: 12/18/2014 01:36 PM   Modules accepted: Orders

## 2014-12-21 LAB — URINE CULTURE: Colony Count: >=100000

## 2014-12-28 DIAGNOSIS — L659 Nonscarring hair loss, unspecified: Secondary | ICD-10-CM | POA: Diagnosis not present

## 2014-12-28 DIAGNOSIS — D485 Neoplasm of uncertain behavior of skin: Secondary | ICD-10-CM | POA: Diagnosis not present

## 2014-12-28 DIAGNOSIS — D692 Other nonthrombocytopenic purpura: Secondary | ICD-10-CM | POA: Diagnosis not present

## 2014-12-28 DIAGNOSIS — L853 Xerosis cutis: Secondary | ICD-10-CM | POA: Diagnosis not present

## 2014-12-28 DIAGNOSIS — C44619 Basal cell carcinoma of skin of left upper limb, including shoulder: Secondary | ICD-10-CM | POA: Diagnosis not present

## 2014-12-30 ENCOUNTER — Encounter: Payer: Self-pay | Admitting: Cardiovascular Disease

## 2014-12-30 ENCOUNTER — Ambulatory Visit (INDEPENDENT_AMBULATORY_CARE_PROVIDER_SITE_OTHER): Payer: Medicare Other | Admitting: Cardiovascular Disease

## 2014-12-30 VITALS — BP 140/68 | HR 57 | Ht 68.0 in | Wt 138.8 lb

## 2014-12-30 DIAGNOSIS — R21 Rash and other nonspecific skin eruption: Secondary | ICD-10-CM

## 2014-12-30 DIAGNOSIS — I480 Paroxysmal atrial fibrillation: Secondary | ICD-10-CM

## 2014-12-30 DIAGNOSIS — R030 Elevated blood-pressure reading, without diagnosis of hypertension: Secondary | ICD-10-CM | POA: Diagnosis not present

## 2014-12-30 DIAGNOSIS — E785 Hyperlipidemia, unspecified: Secondary | ICD-10-CM | POA: Diagnosis not present

## 2014-12-30 DIAGNOSIS — R0989 Other specified symptoms and signs involving the circulatory and respiratory systems: Secondary | ICD-10-CM

## 2014-12-30 NOTE — Assessment & Plan Note (Signed)
Currently not on a statin °

## 2014-12-30 NOTE — Progress Notes (Signed)
Patient ID: Leah Hayden, female    DOB: June 28, 1928, 81 y.o.   MRN: [id number]  HPI Comments: Ms Leah Hayden is a very pleasant 81 year old woman with a history of SVT as well as pauses on Holter, admitted to the hospital August 2012 with atrial fibrillation and converted shortly after to normal sinus rhythm. At least one additional episode of atrial fibrillation since then.  When she converted from afib to sinus, she had a 7.2 second pause, she was asymptomatic.  She was started on antiarrhythmic therapy in order to prevent recurrent afib and subsequently recurrent post-termination pauses. Eliquis was also started.  History of torn biceps of her right arm at the end of September 2014. She wore a splint for 6 weeks. Seen by Dr. Daylene Katayama in [place].  Cardiac catheterization in 2015 showing no significant CAD She presents for routine followup of her atrial fibrillation  In follow-up, she reports that she is doing well. She takes flecainide 50 mg in the morning, xarelto 20 mg daily. She does have some bruising on her arms is otherwise tolerating these medications well. Denies having any significant tachycardia concerning for breakthrough arrhythmia. She has not had to take extra flecainide  Her rash has significantly improved on her face and legs using a cream provided by dermatology/primary care. She does complain of having hair loss, receding hairline. started on a shampoo She's been out of work for 2 months and would like to go back to work. Reports that she is back at her baseline  EKG on today's visit shows sinus bradycardia with rate 53 bpm, no significant ST or T-wave changes  Other past medical history  in the hospital 10/21/2014 for chest pain. Cardiac enzymes negative Laboratory work showing total cholesterol 129, LDL 59, HDL 55, normal renal function She was kept in the hospital for hypertension and rule out, asymptomatic bradycardia. Her amiodarone was initially held for bradycardia  although she was asymptomatic. Heart rate was 45 bpm at rest  Echocardiogram 10/21/2014 shows ejection fraction 60-65%, mildly dilated left atrium, otherwise a normal study  In the middle of April 2015, she awoke in the middle of the night with recurrent tachypalps and chest burning.  she called EMS the following morning.  She was found to be in afib with rvr and was taken to [facility] for eval.  There, troponin was elevated.  Initial ECG showed subtle anterolateral ST elevation.  She was initially treated with IV dilt and oral BB with rate improvement and later conversion.  In the setting of NSTEMI, she underwent cath, which showed nonobs CAD and nl LV fxn. F/U echo corroborated nl LV fxn.    Allergies  Allergen Reactions  . Amiodarone Other (See Comments)    Chest discomfort  . Other Rash    oramycin    Outpatient Encounter Prescriptions as of 10/29/2014  Medication Sig  . albuterol (PROVENTIL HFA) 108 (90 BASE) MCG/ACT inhaler Inhale 2 puffs into the lungs every 6 (six) hours as needed.    [name] Sodium (STOOL SOFTENER) 100 MG capsule Take 100 mg by mouth as needed.   . Fluticasone-Salmeterol (ADVAIR DISKUS) 100-50 MCG/DOSE AEPB Inhale 1 puff into the lungs every 12 (twelve) hours.    Marland Kitchen HYDROcodone-acetaminophen (NORCO/VICODIN) 5-325 MG per tablet Take 1 tablet by mouth every 6 (six) hours as needed for moderate pain.  . montelukast (SINGULAIR) 10 MG tablet Take 10 mg by mouth daily.    . nitroGLYCERIN (NITROSTAT) 0.4 MG SL tablet Place 0.4 mg under  the tongue every 5 (five) minutes as needed for chest pain.  Marland Kitchen triamcinolone lotion (KENALOG) 0.1 %   . atorvastatin (LIPITOR) 80 MG tablet Take 1 tablet (80 mg total) by mouth daily.  Marland Kitchen  ELIQUIS 5 MG TABS tablet TAKE 1 TABLET BY MOUTH TWICE A DAY    Past Medical History  Diagnosis Date  . Allergic rhinitis   . Asthma   . HX: breast cancer   . Hx of colonic polyps   . Diverticulosis of colon   . GERD (gastroesophageal reflux disease)    . Meniere's disease   . Osteoarthritis   . Sinus brady-tachy syndrome     a. Post-conversion pause of 7.2 seconds during 11/2012 hospitalization @ [facility].  Marland Kitchen PAF (paroxysmal atrial fibrillation)     a. Eliquis and amio initiated 11/2012 in setting of admission with RVR and NSTEMI  . Biceps tendon tear     a. right->s/p surgical repair winter of 2014.  . NSTEMI (non-ST elevated myocardial infarction)     a. 11/2012 in setting of rapid afib;  b. 11/2012 Cath: nl EF, nonobs CAD->Med Rx;  b. 11/2012 Echo: EF 60-65%, mild LVH, mild MR/TR.  Marland Kitchen Hammer toe of right foot     Past Surgical History  Procedure Laterality Date  . Mastectomy  1984    left   . Mastectomy  1985    right  . Partial hysterectomy  1966    w/ bladder tack  . Correction hammer toe  1997    2nd to left toe  . Esophagogastroduodenoscopy  10/1999    inflammation only   . Toe surgery  2001/2002    2nd/3rd,left   . Rotator cuff repair  10/09    right   . Abdominal hysterectomy    . Foreign body removal Right 01/02/2013    Procedure: FOREIGN BODY REMOVAL RIGHT INDEX FINGER;  Surgeon: Cammie Sickle., MD;  Location: [place];  Service: Orthopedics;  Laterality: Right;  . Cardiac catheterization  11/2013    [facility]    Social History  reports that she has never smoked. She has never used smokeless tobacco. She reports that she does not drink alcohol or use illicit drugs.  Family History family history includes Arthritis in her brother and mother; Cancer in her maternal grandfather; Colon cancer in her maternal grandfather; Diabetes in her maternal grandmother; Gout in her mother; Heart disease in her father and mother; Heart failure in her father and mother.   Review of Systems  Constitutional: Negative.   Respiratory: Negative.   Cardiovascular: Negative.   Gastrointestinal: Negative.   Musculoskeletal: Negative.   Skin: Negative.   Neurological: Negative.   Hematological: Negative.    Psychiatric/Behavioral: Negative.   All other systems reviewed and are negative.   BP 140/68 mmHg  Pulse 57  Ht 5\' 8"  (1.727 m)  Wt 138 lb 12 oz (62.937 kg)  BMI 21.10 kg/m2  Physical Exam  Constitutional: She is oriented to person, place, and time. She appears well-developed and well-nourished.  HENT:  Head: Normocephalic.  Nose: Nose normal.  Mouth/Throat: Oropharynx is clear and moist.  Eyes: Conjunctivae are normal. Pupils are equal, round, and reactive to light.  Neck: Normal range of motion. Neck supple. No JVD present.  Cardiovascular: Normal rate, regular rhythm, S1 normal, S2 normal, normal heart sounds and intact distal pulses.  Exam reveals no gallop and no friction rub.   No murmur heard. Pulmonary/Chest: Effort normal and breath sounds normal. No  respiratory distress. She has no wheezes. She has no rales. She exhibits no tenderness.  Abdominal: Soft. Bowel sounds are normal. She exhibits no distension. There is no tenderness.  Musculoskeletal: Normal range of motion. She exhibits no edema or tenderness.  Lymphadenopathy:    She has no cervical adenopathy.  Neurological: She is alert and oriented to person, place, and time. Coordination normal.  Skin: Skin is warm and dry. No rash noted. No erythema.  Psychiatric: She has a normal mood and affect. Her behavior is normal. Judgment and thought content normal.    Assessment and Plan  Nursing note and vitals reviewed.

## 2014-12-30 NOTE — Assessment & Plan Note (Signed)
Blood pressures over the last 2 months reviewed with her. She has provided this in a diary on today's visit. Overall they are within a except range. No medication changes made

## 2014-12-30 NOTE — Assessment & Plan Note (Signed)
She is doing well. No medication changes made Recommended if she has breakthrough arrhythmia that she take extra flecainide but not on a regular basis given her bradycardia

## 2014-12-30 NOTE — Assessment & Plan Note (Signed)
Notes indicate she has rosacea. Symptoms have significantly improved using topical cream

## 2014-12-30 NOTE — Patient Instructions (Signed)
You are doing well. No medication changes were made.  Stay on the same medications  Please call us if you have new issues that need to be addressed before your next appt.  Your physician wants you to follow-up in: 6 months.  You will receive a reminder letter in the mail two months in advance. If you don't receive a letter, please call our office to schedule the follow-up appointment.

## 2015-01-12 DIAGNOSIS — L298 Other pruritus: Secondary | ICD-10-CM | POA: Diagnosis not present

## 2015-01-18 DIAGNOSIS — L570 Actinic keratosis: Secondary | ICD-10-CM | POA: Diagnosis not present

## 2015-01-18 DIAGNOSIS — L309 Dermatitis, unspecified: Secondary | ICD-10-CM | POA: Diagnosis not present

## 2015-01-22 DIAGNOSIS — Z961 Presence of intraocular lens: Secondary | ICD-10-CM | POA: Diagnosis not present

## 2015-01-26 DIAGNOSIS — L2089 Other atopic dermatitis: Secondary | ICD-10-CM | POA: Diagnosis not present

## 2015-01-26 DIAGNOSIS — L570 Actinic keratosis: Secondary | ICD-10-CM | POA: Diagnosis not present

## 2015-01-26 DIAGNOSIS — L659 Nonscarring hair loss, unspecified: Secondary | ICD-10-CM | POA: Diagnosis not present

## 2015-02-02 DIAGNOSIS — H18411 Arcus senilis, right eye: Secondary | ICD-10-CM | POA: Diagnosis not present

## 2015-02-02 DIAGNOSIS — Z961 Presence of intraocular lens: Secondary | ICD-10-CM | POA: Diagnosis not present

## 2015-02-02 DIAGNOSIS — H02839 Dermatochalasis of unspecified eye, unspecified eyelid: Secondary | ICD-10-CM | POA: Diagnosis not present

## 2015-02-02 DIAGNOSIS — H26491 Other secondary cataract, right eye: Secondary | ICD-10-CM | POA: Diagnosis not present

## 2015-02-04 DIAGNOSIS — H1045 Other chronic allergic conjunctivitis: Secondary | ICD-10-CM | POA: Diagnosis not present

## 2015-02-04 DIAGNOSIS — J309 Allergic rhinitis, unspecified: Secondary | ICD-10-CM | POA: Diagnosis not present

## 2015-02-04 DIAGNOSIS — J4530 Mild persistent asthma, uncomplicated: Secondary | ICD-10-CM | POA: Diagnosis not present

## 2015-02-04 DIAGNOSIS — R21 Rash and other nonspecific skin eruption: Secondary | ICD-10-CM | POA: Diagnosis not present

## 2015-02-05 DIAGNOSIS — Z4431 Encounter for fitting and adjustment of external right breast prosthesis: Secondary | ICD-10-CM | POA: Diagnosis not present

## 2015-02-05 DIAGNOSIS — C50111 Malignant neoplasm of central portion of right female breast: Secondary | ICD-10-CM | POA: Diagnosis not present

## 2015-02-10 ENCOUNTER — Telehealth: Payer: Self-pay | Admitting: Cardiovascular Disease

## 2015-02-10 DIAGNOSIS — L570 Actinic keratosis: Secondary | ICD-10-CM | POA: Diagnosis not present

## 2015-02-10 NOTE — Telephone Encounter (Signed)
Please call and verify med discrepancy with Leah Hayden at [facility].  Patient has conflicting rx's for Flecainide and itch cream.  Please confirm if Gollan wants patient to keep taking together or if they should dc itch medication as it interacts with flecainide.

## 2015-02-10 NOTE — Telephone Encounter (Signed)
It is unclear what medication pharmacy is stating interacts with flecainide. She is on multiple topical agents. Would advise patient to continue flecainide and have pharmacy contact the prescribing provider for the cream they are concerned about and get their input on continuing vs discontinuing since they prescribed the medication as an alternative may be required.

## 2015-02-10 NOTE — Telephone Encounter (Signed)
Spoke w/ Lattie Haw, pharmacist and advised her of Ryan's recommendation.  She does clarify that the med in question is hydroxyzine tablet, not cream.  She will contact dermatologist regarding a different rx.

## 2015-02-22 ENCOUNTER — Ambulatory Visit (INDEPENDENT_AMBULATORY_CARE_PROVIDER_SITE_OTHER): Payer: Medicare Other | Admitting: Internal Medicine

## 2015-02-22 ENCOUNTER — Encounter: Payer: Self-pay | Admitting: Internal Medicine

## 2015-02-22 VITALS — BP 130/60 | HR 71 | Temp 97.5°F | Wt 140.0 lb

## 2015-02-22 DIAGNOSIS — N3000 Acute cystitis without hematuria: Secondary | ICD-10-CM | POA: Diagnosis not present

## 2015-02-22 DIAGNOSIS — R300 Dysuria: Secondary | ICD-10-CM

## 2015-02-22 LAB — POCT URINALYSIS DIPSTICK
Bilirubin, UA: NEGATIVE
Glucose, UA: NEGATIVE
Ketones, UA: NEGATIVE
Nitrite, UA: NEGATIVE
Protein, UA: NEGATIVE
Spec Grav, UA: 1.015
Urobilinogen, UA: NEGATIVE
pH, UA: 6

## 2015-02-22 MED ORDER — SULFAMETHOXAZOLE-TRIMETHOPRIM 400-80 MG PO TABS: 1.0000 | ORAL_TABLET | Freq: Two times a day (BID) | ORAL | Status: DC

## 2015-02-22 NOTE — Progress Notes (Signed)
Subjective:    Patient ID: Leah Hayden, female    DOB: [birth year], 81 y.o.   MRN: [id number]  HPI Here due to urinary symptoms Did seem to get better after visit 2 months ago Was awoken ~12-1AM with urgency. Then went 4 more times Some pain if she waits No blood in urine  No fever No back pain No N/V  Current Outpatient Prescriptions on File Prior to Visit  Medication Sig Dispense Refill  . albuterol (PROVENTIL HFA) 108 (90 BASE) MCG/ACT inhaler Inhale 2 puffs into the lungs every 6 (six) hours as needed.      . flecainide (TAMBOCOR) 50 MG tablet Take 50 mg by mouth daily.    . Fluticasone-Salmeterol (ADVAIR DISKUS) 100-50 MCG/DOSE AEPB Inhale 1 puff into the lungs every 12 (twelve) hours.      Marland Kitchen ketoconazole (NIZORAL) 2 % shampoo Apply 1 application topically 2 (two) times a week. 120 mL 0  . metroNIDAZOLE (METROGEL) 0.75 % gel Apply 1 application topically 2 (two) times daily. 45 g 0  . montelukast (SINGULAIR) 10 MG tablet Take 10 mg by mouth daily.      . nitroGLYCERIN (NITROSTAT) 0.4 MG SL tablet Place 0.4 mg under the tongue every 5 (five) minutes as needed for chest pain.    . rivaroxaban (XARELTO) 20 MG TABS tablet Take 1 tablet (20 mg total) by mouth daily with supper. 30 tablet 6   No current facility-administered medications on file prior to visit.    Allergies  Allergen Reactions  . Amiodarone Other (See Comments)    Chest discomfort  . Other Rash    oramycin    Past Medical History  Diagnosis Date  . Allergic rhinitis   . Asthma   . HX: breast cancer   . Hx of colonic polyps   . Diverticulosis of colon   . GERD (gastroesophageal reflux disease)   . Meniere's disease   . Osteoarthritis   . Sinus brady-tachy syndrome     a. Post-conversion pause of 7.2 seconds during 11/2012 hospitalization @ [facility].  Marland Kitchen PAF (paroxysmal atrial fibrillation)     a. Eliquis and amio initiated 11/2012 in setting of admission with RVR and NSTEMI  . Biceps tendon tear     a.  right->s/p surgical repair winter of 2014.  . NSTEMI (non-ST elevated myocardial infarction)     a. 11/2012 in setting of rapid afib;  b. 11/2012 Cath: nl EF, nonobs CAD->Med Rx;  b. 11/2012 Echo: EF 60-65%, mild LVH, mild MR/TR.  Marland Kitchen Hammer toe of right foot     Past Surgical History  Procedure Laterality Date  . Mastectomy  1984    left   . Mastectomy  1985    right  . Partial hysterectomy  1966    w/ bladder tack  . Correction hammer toe  1997    2nd to left toe  . Esophagogastroduodenoscopy  10/1999    inflammation only   . Toe surgery  2001/2002    2nd/3rd,left   . Rotator cuff repair  10/09    right   . Abdominal hysterectomy    . Foreign body removal Right 01/02/2013    Procedure: FOREIGN BODY REMOVAL RIGHT INDEX FINGER;  Surgeon: Cammie Sickle., MD;  Location: [place];  Service: Orthopedics;  Laterality: Right;  . Cardiac catheterization  11/2013    [facility]    Family History  Problem Relation Age of Onset  . Heart failure Mother   .  Gout Mother   . Heart disease Mother     heart failure  . Arthritis Mother   . Heart failure Father   . Heart disease Father     heart failure  . Diabetes Maternal Grandmother   . Colon cancer Maternal Grandfather   . Cancer Maternal Grandfather     colon  . Arthritis Brother     History   Social History  . Marital Status: Widowed    Spouse Name: N/A  . Number of Children: 2  . Years of Education: N/A   Occupational History  . Lab Scientist, research (life sciences)   Social History Main Topics  . Smoking status: Never Smoker   . Smokeless tobacco: Never Used  . Alcohol Use: No  . Drug Use: No  . Sexual Activity: Not on file   Other Topics Concern  . Not on file   Social History Narrative   Now has living will   Sons are health care POAs   Has DNR   No tube feeds if cognitively unaware   Review of Systems Some mild swelling in ankles Mild "not feeling up to par" Eating okay       Objective:   Physical Exam  Constitutional: She appears well-developed and well-nourished. No distress.  Abdominal: Soft. She exhibits no distension. There is no rebound and no guarding.  Mild suprapubic tenderness  Musculoskeletal:  No CVA tenderness          Assessment & Plan:

## 2015-02-22 NOTE — Progress Notes (Signed)
Pre visit review using our clinic review tool, if applicable. No additional management support is needed unless otherwise documented below in the visit note. 

## 2015-02-22 NOTE — Patient Instructions (Addendum)
Please start the antibiotic and take 2 doses today. If your symptoms are gone by tomorrow, you can stop the antibiotic after 3 days (and save it for when you next get urinary symptoms) If your symptoms aren't gone by tomorrow--take the entire 10 days.

## 2015-02-22 NOTE — Assessment & Plan Note (Signed)
Clearly abnormal urine but no real systemic symptoms Will try low dose septra for 3 days and extend if needed

## 2015-03-03 DIAGNOSIS — L570 Actinic keratosis: Secondary | ICD-10-CM | POA: Diagnosis not present

## 2015-04-07 DIAGNOSIS — L570 Actinic keratosis: Secondary | ICD-10-CM | POA: Diagnosis not present

## 2015-04-26 ENCOUNTER — Ambulatory Visit (INDEPENDENT_AMBULATORY_CARE_PROVIDER_SITE_OTHER): Payer: Medicare Other

## 2015-04-26 ENCOUNTER — Ambulatory Visit (INDEPENDENT_AMBULATORY_CARE_PROVIDER_SITE_OTHER): Payer: Medicare Other | Admitting: Podiatry

## 2015-04-26 ENCOUNTER — Encounter: Payer: Self-pay | Admitting: Podiatry

## 2015-04-26 ENCOUNTER — Ambulatory Visit: Payer: Medicare Other

## 2015-04-26 VITALS — BP 138/74 | HR 68 | Resp 12

## 2015-04-26 DIAGNOSIS — M779 Enthesopathy, unspecified: Secondary | ICD-10-CM

## 2015-04-26 DIAGNOSIS — M76822 Posterior tibial tendinitis, left leg: Secondary | ICD-10-CM | POA: Diagnosis not present

## 2015-04-26 NOTE — Progress Notes (Signed)
   Subjective:    Patient ID: Leah Hayden, female    DOB: February 23, 1928, 81 y.o.   MRN: [id number]  HPI: She presents with pain to the left medial side of her foot. She stated and present for several weeks and he seems to be getting worse. She tried ice to no avail. She denies trauma.    Review of Systems  Musculoskeletal: Positive for joint swelling.  Hematological: Bruises/bleeds easily.       Objective:   Physical Exam: 81 year old female in no acute distress today presents vitals signs stable alert and oriented 3. Pulses are strongly palpable bilateral. Neurologic sensorium is intact per Semmes-Weinstein monofilament. Deep tendon reflexes intact bilateral and muscle strength +5 over 5 dorsiflexion plantar flexors and inverters everters on physical musculatures intact. Orthopedic evaluation demonstrates pes planus left. She has tenderness on palpation of the posterior tibial tendon as it courses beneath the navicular tuberosity extending distally. Radiographs taken today demonstrate no fractures soft tissue increase in density along the tendon. Cutaneous evaluation demonstrates supple well-hydrated cutis no erythema edema cellulitis drainage or odor.        Assessment & Plan:  Posterior tibial tendinitis left foot.  Plan: Injected the posterior tibial tendon today with dexamethasone and local and aesthetic point of maximal tenderness distal to the navicular tuberosity. Discussed appropriate shoe gear stretching a size ice therapy shoe modifications area follow-up with her in 1 month if necessary.

## 2015-05-17 ENCOUNTER — Encounter: Payer: Medicare Other | Admitting: Internal Medicine

## 2015-05-17 DIAGNOSIS — L570 Actinic keratosis: Secondary | ICD-10-CM | POA: Diagnosis not present

## 2015-05-17 DIAGNOSIS — C4491 Basal cell carcinoma of skin, unspecified: Secondary | ICD-10-CM | POA: Diagnosis not present

## 2015-05-17 DIAGNOSIS — L218 Other seborrheic dermatitis: Secondary | ICD-10-CM | POA: Diagnosis not present

## 2015-05-17 DIAGNOSIS — L578 Other skin changes due to chronic exposure to nonionizing radiation: Secondary | ICD-10-CM | POA: Diagnosis not present

## 2015-05-24 ENCOUNTER — Ambulatory Visit: Payer: Medicare Other | Admitting: Podiatry

## 2015-06-07 ENCOUNTER — Ambulatory Visit (INDEPENDENT_AMBULATORY_CARE_PROVIDER_SITE_OTHER): Payer: Medicare Other | Admitting: Internal Medicine

## 2015-06-07 ENCOUNTER — Encounter: Payer: Self-pay | Admitting: Internal Medicine

## 2015-06-07 VITALS — BP 122/60 | HR 64 | Temp 98.2°F | Ht 65.5 in | Wt 141.8 lb

## 2015-06-07 DIAGNOSIS — Z0000 Encounter for general adult medical examination without abnormal findings: Secondary | ICD-10-CM

## 2015-06-07 DIAGNOSIS — J4520 Mild intermittent asthma, uncomplicated: Secondary | ICD-10-CM | POA: Diagnosis not present

## 2015-06-07 DIAGNOSIS — I25119 Atherosclerotic heart disease of native coronary artery with unspecified angina pectoris: Secondary | ICD-10-CM | POA: Diagnosis not present

## 2015-06-07 DIAGNOSIS — M159 Polyosteoarthritis, unspecified: Secondary | ICD-10-CM

## 2015-06-07 DIAGNOSIS — I480 Paroxysmal atrial fibrillation: Secondary | ICD-10-CM | POA: Diagnosis not present

## 2015-06-07 DIAGNOSIS — Z7189 Other specified counseling: Secondary | ICD-10-CM

## 2015-06-07 NOTE — Assessment & Plan Note (Signed)
Will rarely use some ibuprofen

## 2015-06-07 NOTE — Progress Notes (Signed)
Pre visit review using our clinic review tool, if applicable. No additional management support is needed unless otherwise documented below in the visit note. 

## 2015-06-07 NOTE — Assessment & Plan Note (Signed)
Controlled with advair Doesn't need rescue much Sees Dr Donneta Romberg

## 2015-06-07 NOTE — Assessment & Plan Note (Signed)
I have personally reviewed the Medicare Annual Wellness questionnaire and have noted 1. The patient's medical and social history 2. Their use of alcohol, tobacco or illicit drugs 3. Their current medications and supplements 4. The patient's functional ability including ADL's, fall risks, home safety risks and hearing or visual             impairment. 5. Diet and physical activities 6. Evidence for depression or mood disorders  The patients weight, height, BMI and visual acuity have been recorded in the chart I have made referrals, counseling and provided education to the patient based review of the above and I have provided the pt with a written personalized care plan for preventive services.  I have provided you with a copy of your personalized plan for preventive services. Please take the time to review along with your updated medication list.  Had flu vaccine at work No cancer screening due to age

## 2015-06-07 NOTE — Assessment & Plan Note (Signed)
Some spells Uses extra flecainide prn On eliquis

## 2015-06-07 NOTE — Assessment & Plan Note (Signed)
Has DNR 

## 2015-06-07 NOTE — Progress Notes (Signed)
Subjective:    Patient ID: Leah Hayden, female    DOB: 04/26/1928, 81 y.o.   MRN: [id number]  HPI Here for Medicare wellness and follow up of chronic medical conditions Reviewed form and advanced directives Reviewed other doctors No tobacco or alchol No set exercise but still does her yard work and works still Vision is okay since cataract surgery--now sees Dr Gloriann Loan Hearing is mildly decreased---not enough for hearing aide Independent with instrumental ADLs Fell once--did have injury No depression or anhedonia--- grandson just got married, this was wonderful No apparent cognitive problems--at most mild memory issues  Doing a little better Awoke at 4 AM with heaviness-- didn't feel palpitations but felt it was another episode Took the flecainide Better after a while Had to take the day off Breathing is okay Hasn't needed the nitro  Still bothered with the rash Right leg swelling with brownish discoloration Taking the medication from the dermatologist Using sunscreen for protection also Had 3 phototherapy sessions also No pain or itching --but doesn't like the way it looks  Bladder symptoms are better Has the antibiotic to take for recurrence Drinking cranberry juice in the morning  Current Outpatient Prescriptions on File Prior to Visit  Medication Sig Dispense Refill  . albuterol (PROVENTIL HFA) 108 (90 BASE) MCG/ACT inhaler Inhale 2 puffs into the lungs every 6 (six) hours as needed.      . flecainide (TAMBOCOR) 50 MG tablet Take 50 mg by mouth 2 (two) times daily.     . Fluticasone-Salmeterol (ADVAIR DISKUS) 100-50 MCG/DOSE AEPB Inhale 1 puff into the lungs every 12 (twelve) hours.      Marland Kitchen ketoconazole (NIZORAL) 2 % shampoo Apply 1 application topically 2 (two) times a week. 120 mL 0  . montelukast (SINGULAIR) 10 MG tablet Take 10 mg by mouth daily.      . nitroGLYCERIN (NITROSTAT) 0.4 MG SL tablet Place 0.4 mg under the tongue every 5 (five) minutes as needed for chest  pain.    . rivaroxaban (XARELTO) 20 MG TABS tablet Take 1 tablet (20 mg total) by mouth daily with supper. 30 tablet 6   No current facility-administered medications on file prior to visit.    Allergies  Allergen Reactions  . Amiodarone Other (See Comments)    Chest discomfort  . Other Rash    oramycin    Past Medical History  Diagnosis Date  . Allergic rhinitis   . Asthma   . HX: breast cancer   . Hx of colonic polyps   . Diverticulosis of colon   . GERD (gastroesophageal reflux disease)   . Meniere's disease   . Osteoarthritis   . Sinus brady-tachy syndrome ([place])     a. Post-conversion pause of 7.2 seconds during 11/2012 hospitalization @ [facility].  Marland Kitchen PAF (paroxysmal atrial fibrillation) ([place])     a. Eliquis and amio initiated 11/2012 in setting of admission with RVR and NSTEMI  . Biceps tendon tear     a. right->s/p surgical repair winter of 2014.  . NSTEMI (non-ST elevated myocardial infarction) ([place])     a. 11/2012 in setting of rapid afib;  b. 11/2012 Cath: nl EF, nonobs CAD->Med Rx;  b. 11/2012 Echo: EF 60-65%, mild LVH, mild MR/TR.  Marland Kitchen Hammer toe of right foot     Past Surgical History  Procedure Laterality Date  . Mastectomy  1984    left   . Mastectomy  1985    right  . Partial hysterectomy  1966  w/ bladder tack  . Correction hammer toe  1997    2nd to left toe  . Esophagogastroduodenoscopy  10/1999    inflammation only   . Toe surgery  2001/2002    2nd/3rd,left   . Rotator cuff repair  10/09    right   . Abdominal hysterectomy    . Foreign body removal Right 01/02/2013    Procedure: FOREIGN BODY REMOVAL RIGHT INDEX FINGER;  Surgeon: Cammie Sickle., MD;  Location: [place];  Service: Orthopedics;  Laterality: Right;  . Cardiac catheterization  11/2013    [facility]  . Hammer toe surgery  11/15    Dr Milinda Pointer    Family History  Problem Relation Age of Onset  . Heart failure Mother   . Gout Mother   . Heart disease Mother     heart failure    . Arthritis Mother   . Heart failure Father   . Heart disease Father     heart failure  . Diabetes Maternal Grandmother   . Colon cancer Maternal Grandfather   . Cancer Maternal Grandfather     colon  . Arthritis Brother     Social History   Social History  . Marital Status: Widowed    Spouse Name: N/A  . Number of Children: 2  . Years of Education: N/A   Occupational History  . Lab Scientist, research (life sciences)   Social History Main Topics  . Smoking status: Never Smoker   . Smokeless tobacco: Never Used  . Alcohol Use: No  . Drug Use: No  . Sexual Activity: Not on file   Other Topics Concern  . Not on file   Social History Narrative   Now has living will   Sons are health care POAs   Has DNR   No tube feeds if cognitively unaware   Review of Systems Appetite is a little better--- weight up slightly Sleeping okay Wears seat belt Broke 2 teeth in a fall--has had repair by the dentist Bowels still slow. Has gas. This is chronic. Rarely uses OTC laxative Mild pain in hands-- no meds for this    Objective:   Physical Exam  Constitutional: She is oriented to person, place, and time. She appears well-developed and well-nourished. No distress.  HENT:  Mouth/Throat: Oropharynx is clear and moist. No oropharyngeal exudate.  Neck: Normal range of motion. Neck supple. No thyromegaly present.  Cardiovascular: Normal rate, regular rhythm, normal heart sounds and intact distal pulses.  Exam reveals no gallop.   No murmur heard. Pulmonary/Chest: Effort normal and breath sounds normal. No respiratory distress. She has no wheezes. She has no rales.  Abdominal: Soft. There is no tenderness.  Musculoskeletal: She exhibits no tenderness.  Lymphadenopathy:    She has no cervical adenopathy.  Neurological: She is alert and oriented to person, place, and time.  President-- "Obama, Bush, ?, Bush" [phone] ? D-l-r-d-w Recall 2/3  Skin:  Brownish  discoloration on calves and forearms No ulcers  Psychiatric: She has a normal mood and affect. Her behavior is normal.          Assessment & Plan:

## 2015-06-07 NOTE — Assessment & Plan Note (Signed)
Gets anginal pain presumably from tachycardia Used flecainide extra--hasn't needed nitro

## 2015-06-08 ENCOUNTER — Encounter: Payer: Self-pay | Admitting: *Deleted

## 2015-06-08 LAB — COMPREHENSIVE METABOLIC PANEL
ALT: 12 U/L (ref 0–35)
AST: 17 U/L (ref 0–37)
Albumin: 3.9 g/dL (ref 3.5–5.2)
Alkaline Phosphatase: 68 U/L (ref 39–117)
BUN: 24 mg/dL — ABNORMAL HIGH (ref 6–23)
CO2: 31 mEq/L (ref 19–32)
Calcium: 9.8 mg/dL (ref 8.4–10.5)
Chloride: 105 mEq/L (ref 96–112)
Creatinine, Ser: 0.92 mg/dL (ref 0.40–1.20)
GFR: 61.61 mL/min (ref >60.00)
Glucose, Bld: 88 mg/dL (ref 70–99)
Potassium: 4.6 mEq/L (ref 3.5–5.1)
Sodium: 142 mEq/L (ref 135–145)
Total Bilirubin: 0.6 mg/dL (ref 0.2–1.2)
Total Protein: 6.5 g/dL (ref 6.0–8.3)

## 2015-06-08 LAB — CBC WITH DIFFERENTIAL/PLATELET
Basophils Absolute: 0 10*3/uL (ref 0.0–0.1)
Basophils Relative: 0.3 % (ref 0.0–3.0)
Eosinophils Absolute: 0.2 10*3/uL (ref 0.0–0.7)
Eosinophils Relative: 2.7 % (ref 0.0–5.0)
HCT: 41.5 % (ref 36.0–46.0)
Hemoglobin: 13.8 g/dL (ref 12.0–15.0)
Lymphocytes Relative: 29.2 % (ref 12.0–46.0)
Lymphs Abs: 1.7 10*3/uL (ref 0.7–4.0)
MCHC: 33.4 g/dL (ref 30.0–36.0)
MCV: 92.4 fl (ref 78.0–100.0)
Monocytes Absolute: 0.5 10*3/uL (ref 0.1–1.0)
Monocytes Relative: 7.8 % (ref 3.0–12.0)
Neutro Abs: 3.5 10*3/uL (ref 1.4–7.7)
Neutrophils Relative %: 60 % (ref 43.0–77.0)
Platelets: 213 10*3/uL (ref 150.0–400.0)
RBC: 4.49 Mil/uL (ref 3.87–5.11)
RDW: 13.2 % (ref 11.5–15.5)
WBC: 5.9 10*3/uL (ref 4.0–10.5)

## 2015-06-08 LAB — T4, FREE: Free T4: 1.05 ng/dL (ref 0.60–1.60)

## 2015-07-01 ENCOUNTER — Telehealth: Payer: Self-pay

## 2015-07-01 NOTE — Telephone Encounter (Signed)
Pt called, states she is having a "nagging headache". States she has been taking ibuprofen. Thinks she needs to see Dr. Rockey Situ before January

## 2015-07-01 NOTE — Telephone Encounter (Signed)
Left message for pt to call back w/ BP readings and that I am placing her on waiting list in the event of a cancellation.  Advised her to contact her PCP if her HA is not associated w/ elevated BP.

## 2015-07-02 ENCOUNTER — Encounter: Payer: Self-pay | Admitting: Nurse Practitioner

## 2015-07-02 ENCOUNTER — Ambulatory Visit (INDEPENDENT_AMBULATORY_CARE_PROVIDER_SITE_OTHER): Payer: PPO | Admitting: Nurse Practitioner

## 2015-07-02 VITALS — BP 142/62 | HR 66 | Ht 68.0 in | Wt 147.5 lb

## 2015-07-02 DIAGNOSIS — I4891 Unspecified atrial fibrillation: Secondary | ICD-10-CM

## 2015-07-02 DIAGNOSIS — I480 Paroxysmal atrial fibrillation: Secondary | ICD-10-CM

## 2015-07-02 NOTE — Telephone Encounter (Signed)
Attempted to contact pt. No answer, no vm on cell #, unable to leave message.  Left message at home # for pt to call back.

## 2015-07-02 NOTE — Telephone Encounter (Signed)
Ignacia Bayley, NP had cancellation today @ 3:30. Pt is agreeable to coming over at that time.

## 2015-07-02 NOTE — Telephone Encounter (Signed)
Pt calling back gave her message from before and asked her to call back with more BP readings, she did not have any at the moment.  Also she mentioned the reason she was calling us and not PCP was that her told her in Oct (see note) that her symptoms seem more like angina then Afib She is concerned over this  Please advise on this and I will also add her on waitlist.

## 2015-07-02 NOTE — Patient Instructions (Signed)
Medication Instructions:  Please continue your current medications  Labwork: None  Testing/Procedures: None  Follow-Up: 3 months w/ Dr. Gollan  If you need a refill on your cardiac medications before your next appointment, please call your pharmacy.   

## 2015-07-02 NOTE — Progress Notes (Signed)
Patient Name: Leah Hayden Date of Encounter: 07/02/2015  Primary Care Provider:  Viviana Simpler, MD Primary Cardiologist:  Johnny Bridge, MD   Chief Complaint  81 y/o female with a h/o PAF who presents for f/u.  Past Medical History   Past Medical History  Diagnosis Date  . Allergic rhinitis   . Asthma   . HX: breast cancer   . Hx of colonic polyps   . Diverticulosis of colon   . GERD (gastroesophageal reflux disease)   . Meniere's disease   . Osteoarthritis   . Sinus brady-tachy syndrome ([place])     a. Post-conversion pause of 7.2 seconds during 11/2012 hospitalization @ [facility].  Marland Kitchen PAF (paroxysmal atrial fibrillation) ([place])     a. Eliquis and amio initiated 11/2012 in setting of admission with RVR and NSTEMI;  b. Subsequently changed to flecainide and xarelto;  c. 11/2013 Echo: EF 60-65%, mild LVH, mold MR/TR.  Marland Kitchen Biceps tendon tear     a. right->s/p surgical repair winter of 2014.  . NSTEMI (non-ST elevated myocardial infarction) ([place])     a. 11/2012 in setting of rapid afib;  b. 11/2012 Cath: nl EF, nonobs CAD->Med Rx;  b. 11/2012 Echo: EF 60-65%, mild LVH, mild MR/TR.  Marland Kitchen Hammer toe of right foot    Past Surgical History  Procedure Laterality Date  . Mastectomy  1984    left   . Mastectomy  1985    right  . Partial hysterectomy  1966    w/ bladder tack  . Correction hammer toe  1997    2nd to left toe  . Esophagogastroduodenoscopy  10/1999    inflammation only   . Toe surgery  2001/2002    2nd/3rd,left   . Rotator cuff repair  10/09    right   . Abdominal hysterectomy    . Foreign body removal Right 01/02/2013    Procedure: FOREIGN BODY REMOVAL RIGHT INDEX FINGER;  Surgeon: Cammie Sickle., MD;  Location: [place];  Service: Orthopedics;  Laterality: Right;  . [facility]iac [facility]  11/2013    [facility]  . Hammer toe surgery  11/15    Dr Milinda Pointer    Allergies  Allergies  Allergen Reactions  . Amiodarone Other (See Comments)    Chest discomfort  .  Other Rash    oramycin    HPI  81 y/o female with the above problem list.  She has a h/o PAF and is currently maintained on flecainide and xarelto.  In 11/2013, in the setting of rapid AF, she suffered a NSTEMI.  Cath @ that time showed nl cors, while echo showed nl LV fxn.    She has been doing reasonably well at home. Over the past year, she believes that she maybe had one paroxysm of atrial fibrillation occurring about 6 weeks ago, for which she took an extra flecainide. She did have mild chest tightness associated with that. She has not had any recurrent paroxysms since. She is fairly active at home and has no prior history of exertional chest pain or dyspnea. She denies PND, orthopnea, dizziness, syncope, edema, or early satiety.  Home Medications  Prior to Admission medications   Medication Sig Start Date End Date Taking? Authorizing Provider  albuterol (PROVENTIL HFA) 108 (90 BASE) MCG/ACT inhaler Inhale 2 puffs into the lungs every 6 (six) hours as needed.     Yes Historical Provider, MD  flecainide (TAMBOCOR) 50 MG tablet Take 50 mg by mouth 2 (two)  times daily.    Yes Historical Provider, MD  Fluticasone-Salmeterol (ADVAIR DISKUS) 100-50 MCG/DOSE AEPB Inhale 1 puff into the lungs every 12 (twelve) hours.     Yes Historical Provider, MD  ketoconazole (NIZORAL) 2 % shampoo Apply 1 application topically 2 (two) times a week. 12/03/14  Yes Pleas Koch, NP  montelukast (SINGULAIR) 10 MG tablet Take 10 mg by mouth daily.     Yes Historical Provider, MD  nitroGLYCERIN (NITROSTAT) 0.4 MG SL tablet Place 0.4 mg under the tongue every 5 (five) minutes as needed for chest pain.   Yes Historical Provider, MD  rivaroxaban (XARELTO) 20 MG TABS tablet Take 1 tablet (20 mg total) by mouth daily with supper. 10/29/14  Yes Minna Merritts, MD    Review of Systems  She did have a brief episode of what she believes was paroxysmal atrial fibrillation so she will chest tightness about 6 weeks ago.   She denies chest pain, palpitations, dyspnea, pnd, orthopnea, n, v, dizziness, syncope, edema, weight gain, or early satiety.  All other systems reviewed and are otherwise negative except as noted above.  Physical Exam  VS:  BP 142/62 mmHg  Pulse 66  Ht 5\' 8"  (1.727 m)  Wt 147 lb 8 oz (66.906 kg)  BMI 22.43 kg/m2 , BMI Body mass index is 22.43 kg/(m^2). GEN: Well nourished, well developed, in no acute distress. HEENT: normal. Neck: Supple, no JVD, carotid bruits, or masses. Cardiac: RRR, 2/6 SEM RUSB, no rubs, or gallops. No clubbing, cyanosis, edema.  Radials/DP/PT 2+ and equal bilaterally.  Respiratory:  Respirations regular and unlabored, clear to auscultation bilaterally. GI: Soft, nontender, nondistended, BS + x 4. MS: no deformity or atrophy. Skin: warm and dry, no rash. Neuro:  Strength and sensation are intact. Psych: Normal affect.  Accessory Clinical Findings  ECG - RSR, 68, LAD, LAFB.  Assessment & Plan  1.  Paroxysmal atrial fibrillation: Patient has been doing recently well on flecainide and so relative. About 6 weeks ago, she believes she had a paroxysm of atrial fibrillation which was fairly short-lived. She did take 1 extra dose of flecainide. There was some associated mild chest tightness. She has not expressed any chest tightness outside of that episode despite being active at home. No changes to medical regimen today.  2. Chest tightness/history of non-STEMI: As above, patient explains mild chest tightness in the setting of paroxysmal atrial fibrillation about 6 weeks ago. Symptoms resolved with resolution of A. fib. She has not had any recurrent symptoms. She is otherwise active and has never had exertional chest pain or dyspnea. She had a catheterization in April 2015 revealing normal coronary arteries. We have collectively decided to forego any further ischemic evaluation at this time but if she were to develop recurrent chest discomfort or chest discomfort in the  absence of A. fib, we should consider pursuing stress testing.  3. Disposition: Follow-up with Dr. Rockey Situ in 4 months.    Murray Hodgkins, NP 07/02/2015, 4:38 PM

## 2015-07-03 ENCOUNTER — Other Ambulatory Visit: Payer: Self-pay | Admitting: Cardiovascular Disease

## 2015-08-04 ENCOUNTER — Other Ambulatory Visit: Payer: Self-pay | Admitting: *Deleted

## 2015-08-04 MED ORDER — FLECAINIDE ACETATE 50 MG PO TABS: 50.0000 mg | ORAL_TABLET | Freq: Two times a day (BID) | ORAL | Status: DC

## 2015-08-25 ENCOUNTER — Ambulatory Visit: Payer: Medicare Other | Admitting: Cardiovascular Disease

## 2015-09-20 DIAGNOSIS — H04123 Dry eye syndrome of bilateral lacrimal glands: Secondary | ICD-10-CM | POA: Diagnosis not present

## 2015-09-23 DIAGNOSIS — H0014 Chalazion left upper eyelid: Secondary | ICD-10-CM | POA: Diagnosis not present

## 2015-09-27 ENCOUNTER — Encounter: Payer: Self-pay | Admitting: Internal Medicine

## 2015-09-27 ENCOUNTER — Ambulatory Visit (INDEPENDENT_AMBULATORY_CARE_PROVIDER_SITE_OTHER): Payer: PPO | Admitting: Internal Medicine

## 2015-09-27 VITALS — BP 110/60 | HR 74 | Temp 98.1°F | Wt 155.0 lb

## 2015-09-27 DIAGNOSIS — M79672 Pain in left foot: Secondary | ICD-10-CM | POA: Diagnosis not present

## 2015-09-27 NOTE — Patient Instructions (Signed)
Please try intermittent ice on your foot. It would be okay to do the soak if that feels better. You can take 200mg  of ibuprofen up to three times daily (with food). Keep the podiatry appointment on Thursday unless your foot is much better.

## 2015-09-27 NOTE — Assessment & Plan Note (Signed)
I don't think there is gout or bony injury Seems to be soft tissue---may have had unknown injury to arch (despite wearing supports) Discussed ice Tid ibuprofen Keep appt with podiatrist on Thursday unless much better

## 2015-09-27 NOTE — Progress Notes (Signed)
Subjective:    Patient ID: Leah Hayden, female    DOB: 04-04-1928, 82 y.o.   MRN: [id number]  HPI Here with DIL---due to sore foot Was looking to get in with Dr Hyatt---but couldn't get in till Thursday  3rd day of left foot pain Swollen Doesn't remember any injury Started using walking boot yesterday (she had left over)  Soaked with epsom salts 4 times per day Ice also Tried 1 ibuprofen-- 200mg . Did help temporarily Has also tried elevation  Current Outpatient Prescriptions on File Prior to Visit  Medication Sig Dispense Refill  . albuterol (PROVENTIL HFA) 108 (90 BASE) MCG/ACT inhaler Inhale 2 puffs into the lungs every 6 (six) hours as needed.      . flecainide (TAMBOCOR) 50 MG tablet Take 1 tablet (50 mg total) by mouth 2 (two) times daily. 180 tablet 3  . Fluticasone-Salmeterol (ADVAIR DISKUS) 100-50 MCG/DOSE AEPB Inhale 1 puff into the lungs every 12 (twelve) hours.      . montelukast (SINGULAIR) 10 MG tablet Take 10 mg by mouth daily.      . nitroGLYCERIN (NITROSTAT) 0.4 MG SL tablet Place 0.4 mg under the tongue every 5 (five) minutes as needed for chest pain.    Marland Kitchen XARELTO 20 MG TABS tablet TAKE 1 TABLET BY MOUTH ONCE DAILY WITH SUPPER 30 tablet 6   No current facility-administered medications on file prior to visit.    Allergies  Allergen Reactions  . Amiodarone Other (See Comments)    Chest discomfort  . Other Rash    oramycin    Past Medical History  Diagnosis Date  . Allergic rhinitis   . Asthma   . HX: breast cancer   . Hx of colonic polyps   . Diverticulosis of colon   . GERD (gastroesophageal reflux disease)   . Meniere's disease   . Osteoarthritis   . Sinus brady-tachy syndrome ([place])     a. Post-conversion pause of 7.2 seconds during 11/2012 hospitalization @ [facility].  Marland Kitchen PAF (paroxysmal atrial fibrillation) ([place])     a. Eliquis and amio initiated 11/2012 in setting of admission with RVR and NSTEMI;  b. Subsequently changed to flecainide and xarelto;   c. 11/2013 Echo: EF 60-65%, mild LVH, mold MR/TR.  Marland Kitchen Biceps tendon tear     a. right->s/p surgical repair winter of 2014.  . NSTEMI (non-ST elevated myocardial infarction) ([place])     a. 11/2012 in setting of rapid afib;  b. 11/2012 Cath: nl EF, nonobs CAD->Med Rx;  b. 11/2012 Echo: EF 60-65%, mild LVH, mild MR/TR.  Marland Kitchen Hammer toe of right foot     Past Surgical History  Procedure Laterality Date  . Mastectomy  1984    left   . Mastectomy  1985    right  . Partial hysterectomy  1966    w/ bladder tack  . Correction hammer toe  1997    2nd to left toe  . Esophagogastroduodenoscopy  10/1999    inflammation only   . Toe surgery  2001/2002    2nd/3rd,left   . Rotator cuff repair  10/09    right   . Abdominal hysterectomy    . Foreign body removal Right 01/02/2013    Procedure: FOREIGN BODY REMOVAL RIGHT INDEX FINGER;  Surgeon: Cammie Sickle., MD;  Location: [place];  Service: Orthopedics;  Laterality: Right;  . [facility]iac [facility]  [facility]/2013    [facility]  . Hammer toe surgery  11/15    Dr  Hyatt    Family History  Problem Relation Age of Onset  . Heart failure Mother   . Gout Mother   . Heart disease Mother     heart failure  . Arthritis Mother   . Heart failure Father   . Heart disease Father     heart failure  . Diabetes Maternal Grandmother   . Colon cancer Maternal Grandfather   . Cancer Maternal Grandfather     colon  . Arthritis Brother     Social History   Social History  . Marital Status: Widowed    Spouse Name: N/A  . Number of Children: 2  . Years of Education: N/A   Occupational History  . Lab Scientist, research (life sciences)   Social History Main Topics  . Smoking status: Never Smoker   . Smokeless tobacco: Never Used  . Alcohol Use: No  . Drug Use: No  . Sexual Activity: Not on file   Other Topics Concern  . Not on file   Social History Narrative   Now has living will   Sons are health care POAs   Has DNR   No tube  feeds if cognitively unaware   Review of Systems  No fever No other joint issues     Objective:   Physical Exam  Musculoskeletal:  Mild swelling just distal to medial malleolus on left No pain at malleoli Full ROM without pain at ankle Slight redness at proximal 1st metatarsal but no distinct tenderness or warmth  Neurological:  Normal gait with full weight bearing but both arches collapse (inverts)          Assessment & Plan:

## 2015-09-27 NOTE — Progress Notes (Signed)
Pre visit review using our clinic review tool, if applicable. No additional management support is needed unless otherwise documented below in the visit note. 

## 2015-09-30 ENCOUNTER — Ambulatory Visit: Payer: Medicare Other | Admitting: Podiatry

## 2015-10-05 ENCOUNTER — Ambulatory Visit (INDEPENDENT_AMBULATORY_CARE_PROVIDER_SITE_OTHER): Payer: PPO | Admitting: Cardiovascular Disease

## 2015-10-05 ENCOUNTER — Encounter: Payer: Self-pay | Admitting: Cardiovascular Disease

## 2015-10-05 VITALS — BP 130/68 | HR 57 | Ht 68.0 in | Wt 153.0 lb

## 2015-10-05 DIAGNOSIS — I480 Paroxysmal atrial fibrillation: Secondary | ICD-10-CM | POA: Diagnosis not present

## 2015-10-05 DIAGNOSIS — E785 Hyperlipidemia, unspecified: Secondary | ICD-10-CM | POA: Diagnosis not present

## 2015-10-05 DIAGNOSIS — R079 Chest pain, unspecified: Secondary | ICD-10-CM

## 2015-10-05 DIAGNOSIS — I25119 Atherosclerotic heart disease of native coronary artery with unspecified angina pectoris: Secondary | ICD-10-CM

## 2015-10-05 DIAGNOSIS — R413 Other amnesia: Secondary | ICD-10-CM | POA: Insufficient documentation

## 2015-10-05 NOTE — Assessment & Plan Note (Signed)
Currently not on a statin. No recent lipid panel apart from 2 years ago

## 2015-10-05 NOTE — Patient Instructions (Signed)
You are doing well. No medication changes were made.  Please call us if you have new issues that need to be addressed before your next appt.  Your physician wants you to follow-up in: 6 months.  You will receive a reminder letter in the mail two months in advance. If you don't receive a letter, please call our office to schedule the follow-up appointment.   

## 2015-10-05 NOTE — Assessment & Plan Note (Signed)
Previous cardiac catheterization with nonobstructive disease in 2015 Episode of tachycardia 2 months ago, more likely arrhythmia No further cardiac workup at this time

## 2015-10-05 NOTE — Progress Notes (Signed)
Patient ID: Leah Hayden, female    DOB: July 29, 1928, 82 y.o.   MRN: [id number]  HPI Comments: Leah Hayden is a very pleasant 82 year old woman with a history of SVT as well as pauses on Holter, admitted to the hospital August 2012 with atrial fibrillation and converted shortly after to normal sinus rhythm. At least one additional episode of atrial fibrillation since then.  When she converted from afib to sinus, she had a 7.2 second pause, she was asymptomatic.  She was started on antiarrhythmic therapy in order to prevent recurrent afib and subsequently recurrent post-termination pauses. Eliquis was also started.  Cardiac catheterization in 2015 showing no significant CAD She presents for routine followup of her atrial fibrillation  In follow-up today, family presents with her Family reports she has been having some memory problems, difficulty at work remembering formulas, sometimes making mistakes She's had a difficult 2 weeks and has been out of work Initially had diarrhea for 2 days then developed a infection on her eyelid, and had problems with her left foot arch region with severe discomfort  She reports only having one episode of arrhythmia lasting a short period of time about 2 months ago. She felt palpitations but instead of taking extra flecainide, she took nitroglycerin pill under the tongue. Symptoms did not last very long  She is thinking of retiring at the end of March 2017  EKG on today's visit shows normal sinus rhythm with rate 57 bpm, left axis deviation  Other past medical history  in the hospital 10/21/2014 for chest pain. Cardiac enzymes negative Laboratory work showing total cholesterol 129, LDL 59, HDL 55, normal renal function She was kept in the hospital for hypertension and rule out, asymptomatic bradycardia. Her amiodarone was initially held for bradycardia although she was asymptomatic. Heart rate was 45 bpm at rest  Echocardiogram 10/21/2014 shows ejection fraction  60-65%, mildly dilated left atrium, otherwise a normal study  In the middle of April 2015, she awoke in the middle of the night with recurrent tachypalps and chest burning.  she called EMS the following morning.  She was found to be in afib with rvr and was taken to [facility] for eval.  There, troponin was elevated.  Initial ECG showed subtle anterolateral ST elevation.  She was initially treated with IV dilt and oral BB with rate improvement and later conversion.  In the setting of NSTEMI, she underwent cath, which showed nonobs CAD and nl LV fxn. F/U echo corroborated nl LV fxn.   History of torn biceps of her right arm at the end of September 2014. She wore a splint for 6 weeks. Seen by Dr. Daylene Katayama in [place].    Allergies  Allergen Reactions  . Amiodarone Other (See Comments)    Chest discomfort  . Other Rash    oramycin    Outpatient Encounter Prescriptions as of 10/29/2014  Medication Sig  . albuterol (PROVENTIL HFA) 108 (90 BASE) MCG/ACT inhaler Inhale 2 puffs into the lungs every 6 (six) hours as needed.    [name] Sodium (STOOL SOFTENER) 100 MG capsule Take 100 mg by mouth as needed.   . Fluticasone-Salmeterol (ADVAIR DISKUS) 100-50 MCG/DOSE AEPB Inhale 1 puff into the lungs every 12 (twelve) hours.    Marland Kitchen HYDROcodone-acetaminophen (NORCO/VICODIN) 5-325 MG per tablet Take 1 tablet by mouth every 6 (six) hours as needed for moderate pain.  . montelukast (SINGULAIR) 10 MG tablet Take 10 mg by mouth daily.    . nitroGLYCERIN (NITROSTAT) 0.4 MG  SL tablet Place 0.4 mg under the tongue every 5 (five) minutes as needed for chest pain.  Marland Kitchen triamcinolone lotion (KENALOG) 0.1 %   . atorvastatin (LIPITOR) 80 MG tablet Take 1 tablet (80 mg total) by mouth daily.  Marland Kitchen  ELIQUIS 5 MG TABS tablet TAKE 1 TABLET BY MOUTH TWICE A DAY    Past Medical History  Diagnosis Date  . Allergic rhinitis   . Asthma   . HX: breast cancer   . Hx of colonic polyps   . Diverticulosis of colon   . GERD  (gastroesophageal reflux disease)   . Meniere's disease   . Osteoarthritis   . Sinus brady-tachy syndrome ([place])     a. Post-conversion pause of 7.2 seconds during 11/2012 hospitalization @ [facility].  Marland Kitchen PAF (paroxysmal atrial fibrillation) ([place])     a. Eliquis and amio initiated 11/2012 in setting of admission with RVR and NSTEMI;  b. Subsequently changed to flecainide and xarelto;  c. 11/2013 Echo: EF 60-65%, mild LVH, mold MR/TR.  Marland Kitchen Biceps tendon tear     a. right->s/p surgical repair winter of 2014.  . NSTEMI (non-ST elevated myocardial infarction) ([place])     a. 11/2012 in setting of rapid afib;  b. 11/2012 Cath: nl EF, nonobs CAD->Med Rx;  b. 11/2012 Echo: EF 60-65%, mild LVH, mild MR/TR.  Marland Kitchen Hammer toe of right foot     Past Surgical History  Procedure Laterality Date  . Mastectomy  1984    left   . Mastectomy  1985    right  . Partial hysterectomy  1966    w/ bladder tack  . Correction hammer toe  1997    2nd to left toe  . Esophagogastroduodenoscopy  10/1999    inflammation only   . Toe surgery  2001/2002    2nd/3rd,left   . Rotator cuff repair  10/09    right   . Abdominal hysterectomy    . Foreign body removal Right 01/02/2013    Procedure: FOREIGN BODY REMOVAL RIGHT INDEX FINGER;  Surgeon: Cammie Sickle., MD;  Location: [place];  Service: Orthopedics;  Laterality: Right;  . [facility]iac [facility]  [facility]/2013    [facility]  . Hammer toe surgery  11/15    Dr Milinda Pointer    Social History  reports that she has never smoked. She has never used smokeless tobacco. She reports that she does not drink alcohol or use illicit drugs.  Family History family history includes Arthritis in her brother and mother; Cancer in her maternal grandfather; Colon cancer in her maternal grandfather; Diabetes in her maternal grandmother; Gout in her mother; Heart disease in her father and mother; Heart failure in her father and mother.   Review of Systems  Constitutional: Negative.    Respiratory: Negative.   Cardiovascular: Negative.   Gastrointestinal: Negative.   Musculoskeletal: Negative.   Neurological: Negative.   Hematological: Negative.   Psychiatric/Behavioral: Negative.   All other systems reviewed and are negative.   BP 130/68 mmHg  Pulse 57  Ht 5\' 8"  (1.727 m)  Wt 153 lb (69.4 kg)  BMI 23.27 kg/m2  Physical Exam  Constitutional: She is oriented to person, place, and time. She appears well-developed and well-nourished.  HENT:  Head: Normocephalic.  Nose: Nose normal.  Mouth/Throat: Oropharynx is clear and moist.  Eyes: Conjunctivae are normal. Pupils are equal, round, and reactive to light.  Neck: Normal range of motion. Neck supple. No JVD present.  Cardiovascular: Normal rate, regular rhythm,  S1 normal, S2 normal, normal heart sounds and intact distal pulses.  Exam reveals no gallop and no friction rub.   No murmur heard. Pulmonary/Chest: Effort normal and breath sounds normal. No respiratory distress. She has no wheezes. She has no rales. She exhibits no tenderness.  Abdominal: Soft. Bowel sounds are normal. She exhibits no distension. There is no tenderness.  Musculoskeletal: Normal range of motion. She exhibits no edema or tenderness.  Lymphadenopathy:    She has no cervical adenopathy.  Neurological: She is alert and oriented to person, place, and time. Coordination normal.  Skin: Skin is warm and dry. No rash noted. No erythema.  Psychiatric: She has a normal mood and affect. Her behavior is normal. Judgment and thought content normal.    Assessment and Plan  Nursing note and vitals reviewed.

## 2015-10-05 NOTE — Assessment & Plan Note (Signed)
Long discussion concerning her memory and memory loss. Family is convinced she is having problems. Forgetting things, repeating things. Unclear if some of this could be exacerbated by stress or anxiety. Suggested they talk with Dr. [name] concerning a workup. They wanted to know if testing could be done. Otherwise she is relatively high functioning.   Total encounter time more than 25 minutes  Greater than 50% was spent in counseling and coordination of care with the patient

## 2015-10-05 NOTE — Assessment & Plan Note (Signed)
Rare episodes of arrhythmia Encouraged her to stay on her current regimen Unclear if the flecainide is causing any memory problems Certainly we could do a flecainide vacation for 2 or 3 weeks and see if this makes any difference She does have significant termination pauses which would be a concern if she develops frequent paroxysmal atrial fibrillation

## 2015-10-06 ENCOUNTER — Telehealth: Payer: Self-pay | Admitting: Internal Medicine

## 2015-10-06 NOTE — Telephone Encounter (Signed)
Pt's daughter in law called and says pt is having some memory issues and they would like to discuss this w/you over the phone prior to pt's Friday apptmt.  She says they would rather not say all of it in front of pt.  [name] requests c/b [phone]

## 2015-10-07 NOTE — Telephone Encounter (Signed)
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 Not getting lost---remembers intricacies of trip to [place], etc Will review all tomorrow at visit

## 2015-10-08 ENCOUNTER — Ambulatory Visit (INDEPENDENT_AMBULATORY_CARE_PROVIDER_SITE_OTHER): Payer: PPO | Admitting: Internal Medicine

## 2015-10-08 ENCOUNTER — Encounter: Payer: Self-pay | Admitting: Internal Medicine

## 2015-10-08 VITALS — BP 130/74 | HR 67 | Temp 97.7°F | Wt 152.5 lb

## 2015-10-08 DIAGNOSIS — F0150 Vascular dementia without behavioral disturbance: Secondary | ICD-10-CM | POA: Insufficient documentation

## 2015-10-08 DIAGNOSIS — R413 Other amnesia: Secondary | ICD-10-CM

## 2015-10-08 DIAGNOSIS — F0151 Vascular dementia with behavioral disturbance: Secondary | ICD-10-CM | POA: Insufficient documentation

## 2015-10-08 DIAGNOSIS — F01518 Vascular dementia, unspecified severity, with other behavioral disturbance: Secondary | ICD-10-CM | POA: Insufficient documentation

## 2015-10-08 LAB — CBC WITH DIFFERENTIAL/PLATELET
Basophils Absolute: 0 K/uL (ref 0.0–0.1)
Basophils Relative: 0.5 % (ref 0.0–3.0)
Eosinophils Absolute: 0.2 K/uL (ref 0.0–0.7)
Eosinophils Relative: 3.7 % (ref 0.0–5.0)
HCT: 40 % (ref 36.0–46.0)
Hemoglobin: 13.3 g/dL (ref 12.0–15.0)
Lymphocytes Relative: 33.9 % (ref 12.0–46.0)
Lymphs Abs: 1.8 K/uL (ref 0.7–4.0)
MCHC: 33.2 g/dL (ref 30.0–36.0)
MCV: 92 fl (ref 78.0–100.0)
Monocytes Absolute: 0.5 K/uL (ref 0.1–1.0)
Monocytes Relative: 9 % (ref 3.0–12.0)
Neutro Abs: 2.9 K/uL (ref 1.4–7.7)
Neutrophils Relative %: 52.9 % (ref 43.0–77.0)
Platelets: 217 K/uL (ref 150.0–400.0)
RBC: 4.35 Mil/uL (ref 3.87–5.11)
RDW: 13.3 % (ref 11.5–15.5)
WBC: 5.5 K/uL (ref 4.0–10.5)

## 2015-10-08 LAB — COMPREHENSIVE METABOLIC PANEL WITH GFR
ALT: 11 U/L (ref 0–35)
AST: 17 U/L (ref 0–37)
Albumin: 4.2 g/dL (ref 3.5–5.2)
Alkaline Phosphatase: 66 U/L (ref 39–117)
BUN: 23 mg/dL (ref 6–23)
CO2: 30 meq/L (ref 19–32)
Calcium: 9.8 mg/dL (ref 8.4–10.5)
Chloride: 105 meq/L (ref 96–112)
Creatinine, Ser: 0.88 mg/dL (ref 0.40–1.20)
GFR: 64.8 mL/min
Glucose, Bld: 102 mg/dL — ABNORMAL HIGH (ref 70–99)
Potassium: 4.6 meq/L (ref 3.5–5.1)
Sodium: 141 meq/L (ref 135–145)
Total Bilirubin: 0.7 mg/dL (ref 0.2–1.2)
Total Protein: 6.8 g/dL (ref 6.0–8.3)

## 2015-10-08 LAB — VITAMIN B12: Vitamin B-12: 99 pg/mL — ABNORMAL LOW (ref 211–911)

## 2015-10-08 LAB — T4, FREE: Free T4: 1.11 ng/dL (ref 0.60–1.60)

## 2015-10-08 MED ORDER — LORAZEPAM 0.5 MG PO TABS: 0.5000 mg | ORAL_TABLET | Freq: Once | ORAL | Status: DC

## 2015-10-08 NOTE — Progress Notes (Signed)
Pre visit review using our clinic review tool, if applicable. No additional management support is needed unless otherwise documented below in the visit note. 

## 2015-10-08 NOTE — Progress Notes (Signed)
Subjective:    Patient ID: Leah Hayden, female    DOB: 1927-12-25, 82 y.o.   MRN: [id number]  HPI Here with DIL See her handwritten note Spoke to son yesterday Reviewed Dr Donivan Scull notes  She knows that the family is concerned about her memory She has noted problems at work--but hasn't had issues there Has lost things--- flushed keys down toilet at restaurant by accident Lost cell phone also  Still working--but plans to retire at end of March Hasn't gotten lost in car DIL is now starting to help with checkbook, etc Trouble understanding insurance, disability info (never an issue before)  Current Outpatient Prescriptions on File Prior to Visit  Medication Sig Dispense Refill  . albuterol (PROVENTIL HFA) 108 (90 BASE) MCG/ACT inhaler Inhale 2 puffs into the lungs every 6 (six) hours as needed.      . Cholecalciferol (VITAMIN D3) 5000 units CAPS Take by mouth daily.    . diphenhydrAMINE (BENADRYL) 25 MG tablet Take 25 mg by mouth every 6 (six) hours as needed.    . flecainide (TAMBOCOR) 50 MG tablet Take 1 tablet (50 mg total) by mouth 2 (two) times daily. 180 tablet 3  . Fluticasone-Salmeterol (ADVAIR DISKUS) 100-50 MCG/DOSE AEPB Inhale 1 puff into the lungs every 12 (twelve) hours.      . montelukast (SINGULAIR) 10 MG tablet Take 10 mg by mouth daily.      . nitroGLYCERIN (NITROSTAT) 0.4 MG SL tablet Place 0.4 mg under the tongue every 5 (five) minutes as needed for chest pain.    Marland Kitchen XARELTO 20 MG TABS tablet TAKE 1 TABLET BY MOUTH ONCE DAILY WITH SUPPER 30 tablet 6   No current facility-administered medications on file prior to visit.    Allergies  Allergen Reactions  . Amiodarone Other (See Comments)    Chest discomfort  . Other Rash    oramycin    Past Medical History  Diagnosis Date  . Allergic rhinitis   . Asthma   . HX: breast cancer   . Hx of colonic polyps   . Diverticulosis of colon   . GERD (gastroesophageal reflux disease)   . Meniere's disease   .  Osteoarthritis   . Sinus brady-tachy syndrome ([place])     a. Post-conversion pause of 7.2 seconds during 11/2012 hospitalization @ [facility].  Marland Kitchen PAF (paroxysmal atrial fibrillation) ([place])     a. Eliquis and amio initiated 11/2012 in setting of admission with RVR and NSTEMI;  b. Subsequently changed to flecainide and xarelto;  c. 11/2013 Echo: EF 60-65%, mild LVH, mold MR/TR.  Marland Kitchen Biceps tendon tear     a. right->s/p surgical repair winter of 2014.  . NSTEMI (non-ST elevated myocardial infarction) ([place])     a. 11/2012 in setting of rapid afib;  b. 11/2012 Cath: nl EF, nonobs CAD->Med Rx;  b. 11/2012 Echo: EF 60-65%, mild LVH, mild MR/TR.  Marland Kitchen Hammer toe of right foot     Past Surgical History  Procedure Laterality Date  . Mastectomy  1984    left   . Mastectomy  1985    right  . Partial hysterectomy  1966    w/ bladder tack  . Correction hammer toe  1997    2nd to left toe  . Esophagogastroduodenoscopy  10/1999    inflammation only   . Toe surgery  2001/2002    2nd/3rd,left   . Rotator cuff repair  10/09    right   . Abdominal hysterectomy    .  Foreign body removal Right 01/02/2013    Procedure: FOREIGN BODY REMOVAL RIGHT INDEX FINGER;  Surgeon: Cammie Sickle., MD;  Location: [facility];  Service: Orthopedics;  Laterality: Right;  . [facility]iac [facility]  11/2013    [facility]  . Hammer toe surgery  11/15    Dr Milinda Pointer    Family History  Problem Relation Age of Onset  . Heart failure Mother   . Gout Mother   . Heart disease Mother     heart failure  . Arthritis Mother   . Heart failure Father   . Heart disease Father     heart failure  . Diabetes Maternal Grandmother   . Colon cancer Maternal Grandfather   . Cancer Maternal Grandfather     colon  . Arthritis Brother     Social History   Social History  . Marital Status: Widowed    Spouse Name: N/A  . Number of Children: 2  . Years of Education: N/A   Occupational History  . Lab Musician   Social History Main Topics  . Smoking status: Never Smoker   . Smokeless tobacco: Never Used  . Alcohol Use: No  . Drug Use: No  . Sexual Activity: Not on file   Other Topics Concern  . Not on file   Social History Narrative   Now has living will   Sons are health care POAs   Has DNR   No tube feeds if cognitively unaware   Review of Systems Gets enough sleep Appetite is good now Weight now stabilized No apparent depression or sadness Walks okay No urinary incontinence    Objective:   Physical Exam  Constitutional: She is oriented to person, place, and time.  Neurological: She is alert and oriented to person, place, and time.  President-- "Trump, Obama, Clinton"  Couldn't get Bush [phone]-- 41, I can't do it" D-l-o-r-w Recall 3/3          Assessment & Plan:

## 2015-10-08 NOTE — Assessment & Plan Note (Signed)
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

## 2015-10-09 ENCOUNTER — Other Ambulatory Visit: Payer: Self-pay | Admitting: Internal Medicine

## 2015-10-09 DIAGNOSIS — E538 Deficiency of other specified B group vitamins: Secondary | ICD-10-CM

## 2015-10-11 ENCOUNTER — Ambulatory Visit: Payer: PPO | Admitting: Internal Medicine

## 2015-10-12 ENCOUNTER — Encounter: Payer: Self-pay | Admitting: *Deleted

## 2015-10-18 ENCOUNTER — Ambulatory Visit
Admission: RE | Admit: 2015-10-18 | Discharge: 2015-10-18 | Disposition: A | Discharge status: Home / Self Care | Payer: PPO | Source: Ambulatory Visit | Attending: Internal Medicine | Admitting: Internal Medicine

## 2015-10-18 DIAGNOSIS — R413 Other amnesia: Secondary | ICD-10-CM | POA: Diagnosis not present

## 2015-10-22 ENCOUNTER — Telehealth: Payer: Self-pay | Admitting: *Deleted

## 2015-10-22 NOTE — Telephone Encounter (Signed)
Left message for Leah Hayden on cell.

## 2015-10-22 NOTE — Telephone Encounter (Signed)
I am fine with extending her FMLA Make sure she comes back for the B12 level to see if she will need to start the shots

## 2015-10-22 NOTE — Telephone Encounter (Signed)
[name] returned Shannon's call.  Please call [name] back at [phone].

## 2015-10-22 NOTE — Telephone Encounter (Signed)
Leah Hayden contacted office stating she spoke with [name] regarding MRI results which were normal. Pt is currently on FMLA, to return to work on Wednesday, but is scheduled to retire at the end of the month. Leah Hayden states she does not think that pt feels well enough to return to work, due to B12 beinig low, and is requesting Dr [name] extend her FMLA to extend until she is to retire. Requesting a call back to discuss

## 2015-10-22 NOTE — Telephone Encounter (Signed)
Spoke to Leah Hayden. Created a letter stating she is to continue on FMLA until 11-12-15. I will advise [name] when it is signed and ready for pickup.

## 2015-10-22 NOTE — Telephone Encounter (Signed)
Spoke to [place]. Advised letter was up front.

## 2015-11-03 ENCOUNTER — Other Ambulatory Visit (INDEPENDENT_AMBULATORY_CARE_PROVIDER_SITE_OTHER): Payer: PPO

## 2015-11-03 ENCOUNTER — Telehealth: Payer: Self-pay

## 2015-11-03 DIAGNOSIS — E538 Deficiency of other specified B group vitamins: Secondary | ICD-10-CM

## 2015-11-03 LAB — VITAMIN B12: Vitamin B-12: 336 pg/mL (ref 211–911)

## 2015-11-03 NOTE — Telephone Encounter (Signed)
Pt was in [facility] for labs; 7:15 AM this morning pt washed face and small spot on nose began to spot bleeding, not running blood but pt blotting small spots of blood since washed face. Applied pressure with gauze pad for 10 mins. Spot stopped bleeding; pt declined a spot bandaid; pt said skin extremely sensitive. Gave pt couple of packaged gauze pads incase starts to bleed again; if spotting reoccurs try applying constant pressure for 10 mins and then if continues problem pt will cb. Advised pt when cleansing an area that is fragile try patting with wash cloth rather than rubbing.pt voiced understanding. Pt states she has appt with Dr [name] next week.

## 2015-11-03 NOTE — Telephone Encounter (Signed)
Please check on her later to be sure this has stopped bleeding

## 2015-11-03 NOTE — Telephone Encounter (Signed)
Spoke to patient. She said it has stopped bleeding.

## 2015-11-09 ENCOUNTER — Ambulatory Visit: Payer: PPO | Admitting: Internal Medicine

## 2015-11-10 ENCOUNTER — Ambulatory Visit (INDEPENDENT_AMBULATORY_CARE_PROVIDER_SITE_OTHER): Payer: PPO | Admitting: Internal Medicine

## 2015-11-10 ENCOUNTER — Encounter: Payer: Self-pay | Admitting: Internal Medicine

## 2015-11-10 VITALS — BP 146/84 | HR 61 | Temp 98.3°F | Wt 152.0 lb

## 2015-11-10 DIAGNOSIS — E538 Deficiency of other specified B group vitamins: Secondary | ICD-10-CM | POA: Diagnosis not present

## 2015-11-10 DIAGNOSIS — I480 Paroxysmal atrial fibrillation: Secondary | ICD-10-CM

## 2015-11-10 DIAGNOSIS — I25119 Atherosclerotic heart disease of native coronary artery with unspecified angina pectoris: Secondary | ICD-10-CM

## 2015-11-10 DIAGNOSIS — R413 Other amnesia: Secondary | ICD-10-CM

## 2015-11-10 NOTE — Progress Notes (Signed)
Subjective:    Patient ID: Leah Hayden, female    DOB: February 14, 1928, 82 y.o.   MRN: [id number]  HPI Here for follow up of memory problems With DIL again Reviewed MRI  Hasn't been to work [employer] retired as of 3/31  No clear improvement in memory Clearly seems more social--getting out more Back in choir at church  No functional problems Drives and not getting lost Does shopping, housework, etc Grandson lives next door--helps with yard work, Producer, television/film/video, etc Has trouble with numbers--DIL helping with bank statements, etc  Has spell of tightness under left breast recently No palpitations No SOB No diaphoresis or nausea Took a flecainide--- resolved when she went to sleep  Current Outpatient Prescriptions on File Prior to Visit  Medication Sig Dispense Refill  . albuterol (PROVENTIL HFA) 108 (90 BASE) MCG/ACT inhaler Inhale 2 puffs into the lungs every 6 (six) hours as needed.      . Cholecalciferol (VITAMIN D3) 5000 units CAPS Take by mouth daily.    . diphenhydrAMINE (BENADRYL) 25 MG tablet Take 25 mg by mouth every 6 (six) hours as needed.    . flecainide (TAMBOCOR) 50 MG tablet Take 1 tablet (50 mg total) by mouth 2 (two) times daily. 180 tablet 3  . Fluticasone-Salmeterol (ADVAIR DISKUS) 100-50 MCG/DOSE AEPB Inhale 1 puff into the lungs every 12 (twelve) hours.      . montelukast (SINGULAIR) 10 MG tablet Take 10 mg by mouth daily.      . nitroGLYCERIN (NITROSTAT) 0.4 MG SL tablet Place 0.4 mg under the tongue every 5 (five) minutes as needed for chest pain.    Marland Kitchen XARELTO 20 MG TABS tablet TAKE 1 TABLET BY MOUTH ONCE DAILY WITH SUPPER 30 tablet 6   No current facility-administered medications on file prior to visit.    Allergies  Allergen Reactions  . Amiodarone Other (See Comments)    Chest discomfort  . Other Rash    oramycin    Past Medical History  Diagnosis Date  . Allergic rhinitis   . Asthma   . HX: breast cancer   . Hx of colonic polyps   . Diverticulosis  of colon   . GERD (gastroesophageal reflux disease)   . Meniere's disease   . Osteoarthritis   . Sinus brady-tachy syndrome ([place])     a. Post-conversion pause of 7.2 seconds during 11/2012 hospitalization @ [facility].  Marland Kitchen PAF (paroxysmal atrial fibrillation) ([place])     a. Eliquis and amio initiated 11/2012 in setting of admission with RVR and NSTEMI;  b. Subsequently changed to flecainide and xarelto;  c. 11/2013 Echo: EF 60-65%, mild LVH, mold MR/TR.  Marland Kitchen Biceps tendon tear     a. right->s/p surgical repair winter of 2014.  . NSTEMI (non-ST elevated myocardial infarction) ([place])     a. 11/2012 in setting of rapid afib;  b. 11/2012 Cath: nl EF, nonobs CAD->Med Rx;  b. 11/2012 Echo: EF 60-65%, mild LVH, mild MR/TR.  Marland Kitchen Hammer toe of right foot     Past Surgical History  Procedure Laterality Date  . Mastectomy  1984    left   . Mastectomy  1985    right  . Partial hysterectomy  1966    w/ bladder tack  . Correction hammer toe  1997    2nd to left toe  . Esophagogastroduodenoscopy  10/1999    inflammation only   . Toe surgery  2001/2002    2nd/3rd,left   . Rotator cuff repair  10/09  right   . Abdominal hysterectomy    . Foreign body removal Right 01/02/2013    Procedure: FOREIGN BODY REMOVAL RIGHT INDEX FINGER;  Surgeon: Cammie Sickle., MD;  Location: [facility];  Service: Orthopedics;  Laterality: Right;  . [facility]  [facility]/2013    [facility]  . Hammer toe surgery  11/15    Dr Milinda Pointer    Family History  Problem Relation Age of Onset  . Heart failure Mother   . Gout Mother   . Heart disease Mother     heart failure  . Arthritis Mother   . Heart failure Father   . Heart disease Father     heart failure  . Diabetes Maternal Grandmother   . Colon cancer Maternal Grandfather   . Cancer Maternal Grandfather     colon  . Arthritis Brother     Social History   Social History  . Marital Status: Widowed    Spouse Name: N/A  . Number of Children: 2  . Years of  Education: N/A   Occupational History  . Lab Scientist, research (life sciences)   Social History Main Topics  . Smoking status: Never Smoker   . Smokeless tobacco: Never Used  . Alcohol Use: No  . Drug Use: No  . Sexual Activity: Not on file   Other Topics Concern  . Not on file   Social History Narrative   Now has living will   Sons are health care POAs   Has DNR   No tube feeds if cognitively unaware   Review of Systems  Appetite is pretty good Weight is stable now--but gained from her nadir last year Sleeps well     Objective:   Physical Exam  Constitutional: She appears well-developed and well-nourished. No distress.  Neck: Normal range of motion. Neck supple. No thyromegaly present.  Cardiovascular: Normal rate, regular rhythm and normal heart sounds.  Exam reveals no gallop.   No murmur heard. Pulmonary/Chest: Effort normal and breath sounds normal. No respiratory distress. She has no wheezes. She has no rales.  Lymphadenopathy:    She has no cervical adenopathy.  Neurological:  Normal appearance, speech and engagement          Assessment & Plan:

## 2015-11-10 NOTE — Assessment & Plan Note (Signed)
Recent spell Not sure if caused by a fib--but she took her flecainide She has nitro at home also

## 2015-11-10 NOTE — Progress Notes (Signed)
Pre visit review using our clinic review tool, if applicable. No additional management support is needed unless otherwise documented below in the visit note. 

## 2015-11-10 NOTE — Assessment & Plan Note (Signed)
?  1 spell recently Continues on xarelto

## 2015-11-10 NOTE — Assessment & Plan Note (Signed)
Has stabilized No major findings on MRI More socially outgoing again--so DIL relieved Consider neuro eval if deteriorates again

## 2015-11-10 NOTE — Assessment & Plan Note (Signed)
May have caused cognitive decline Will continue the sublingual replacement

## 2016-01-07 DIAGNOSIS — Z1283 Encounter for screening for malignant neoplasm of skin: Secondary | ICD-10-CM | POA: Diagnosis not present

## 2016-01-07 DIAGNOSIS — Z08 Encounter for follow-up examination after completed treatment for malignant neoplasm: Secondary | ICD-10-CM | POA: Diagnosis not present

## 2016-01-07 DIAGNOSIS — Z85828 Personal history of other malignant neoplasm of skin: Secondary | ICD-10-CM | POA: Diagnosis not present

## 2016-01-07 DIAGNOSIS — L570 Actinic keratosis: Secondary | ICD-10-CM | POA: Diagnosis not present

## 2016-01-07 DIAGNOSIS — L720 Epidermal cyst: Secondary | ICD-10-CM | POA: Diagnosis not present

## 2016-01-19 DIAGNOSIS — Z961 Presence of intraocular lens: Secondary | ICD-10-CM | POA: Diagnosis not present

## 2016-01-27 DIAGNOSIS — R21 Rash and other nonspecific skin eruption: Secondary | ICD-10-CM | POA: Diagnosis not present

## 2016-01-27 DIAGNOSIS — J4530 Mild persistent asthma, uncomplicated: Secondary | ICD-10-CM | POA: Diagnosis not present

## 2016-01-27 DIAGNOSIS — J309 Allergic rhinitis, unspecified: Secondary | ICD-10-CM | POA: Diagnosis not present

## 2016-01-27 DIAGNOSIS — H1045 Other chronic allergic conjunctivitis: Secondary | ICD-10-CM | POA: Diagnosis not present

## 2016-02-05 ENCOUNTER — Other Ambulatory Visit: Payer: Self-pay | Admitting: Cardiovascular Disease

## 2016-04-07 ENCOUNTER — Encounter (INDEPENDENT_AMBULATORY_CARE_PROVIDER_SITE_OTHER): Payer: Self-pay

## 2016-04-07 ENCOUNTER — Encounter: Payer: Self-pay | Admitting: Cardiovascular Disease

## 2016-04-07 ENCOUNTER — Ambulatory Visit (INDEPENDENT_AMBULATORY_CARE_PROVIDER_SITE_OTHER): Payer: PPO | Admitting: Cardiovascular Disease

## 2016-04-07 VITALS — BP 140/68 | HR 64 | Ht 68.0 in | Wt 147.0 lb

## 2016-04-07 DIAGNOSIS — I480 Paroxysmal atrial fibrillation: Secondary | ICD-10-CM | POA: Diagnosis not present

## 2016-04-07 DIAGNOSIS — I471 Supraventricular tachycardia: Secondary | ICD-10-CM | POA: Diagnosis not present

## 2016-04-07 DIAGNOSIS — I25119 Atherosclerotic heart disease of native coronary artery with unspecified angina pectoris: Secondary | ICD-10-CM | POA: Diagnosis not present

## 2016-04-07 NOTE — Patient Instructions (Addendum)
Medication Instructions:   No medication changes  Labwork:  No labs needed  Testing/Procedures:  No testing needed  Follow-Up: It was a pleasure seeing you in the office today. Please call us if you have new issues that need to be addressed before your next appt.  [phone]  Your physician wants you to follow-up in: 12 months.  You will receive a reminder letter in the mail two months in advance. If you don't receive a letter, please call our office to schedule the follow-up appointment.  If you need a refill on your cardiac medications before your next appointment, please call your pharmacy.     

## 2016-04-07 NOTE — Progress Notes (Signed)
Cardiology Office Note  Date:  04/07/2016   ID:  Leah Hayden, DOB Dec 31, 1927, MRN [id number]  PCP:  Viviana Simpler, MD   Chief Complaint  Patient presents with  . Other    C/o leg/ankles edema. Meds reviewed verbally with pt.    HPI:  Leah Hayden is a very pleasant 82 year old woman with a history of SVT as well as pauses on Holter, admitted to the hospital August 2012 with atrial fibrillation and converted shortly after to normal sinus rhythm. At least one additional episode of atrial fibrillation since then.  When she converted from afib to sinus, she had a 7.2 second pause, she was asymptomatic.  She was started on antiarrhythmic therapy in order to prevent recurrent afib and subsequently recurrent post-termination pauses. Eliquis was also started.  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 Cardiac enzymes negative Laboratory work showing total cholesterol 129, LDL 59, HDL 55, normal renal function She was kept in the hospital for hypertension and rule out, asymptomatic bradycardia. Her amiodarone was initially held for bradycardia although she was asymptomatic. Heart rate was 45 bpm at rest  Echocardiogram 10/21/2014 shows ejection fraction 60-65%, mildly dilated left atrium, otherwise a normal study  In the middle of April 2015, she awoke in the middle of the night with recurrent tachypalps and chest burning.  she called EMS the  following morning.  She was found to be in afib with rvr and was taken to [facility] for eval.  There, troponin was elevated.  Initial ECG showed subtle anterolateral ST elevation.  She was initially treated with IV dilt and oral BB with rate improvement and later conversion.  In the setting of NSTEMI, she underwent cath, which showed nonobs CAD and nl LV fxn. F/U echo corroborated nl LV fxn.   History of torn biceps of her right arm at the end of September 2014. She wore a splint for 6 weeks. Seen by Dr. Daylene Katayama in [place].    PMH:   has a past medical history of Allergic rhinitis; Asthma; Biceps tendon tear; Diverticulosis of colon; GERD (gastroesophageal reflux disease); Hammer toe of right foot; colonic polyps; breast cancer; Meniere's disease; NSTEMI (non-ST elevated myocardial infarction) ([place]); Osteoarthritis; PAF (paroxysmal atrial fibrillation) ([place]); and Sinus brady-tachy syndrome ([place]).  PSH:    Past Surgical History:  Procedure Laterality Date  . ABDOMINAL HYSTERECTOMY    . CARDIAC CATHETERIZATION  11/2013   [facility]  . [place]   2nd to left toe  . ESOPHAGOGASTRODUODENOSCOPY  10/1999   inflammation only   . FOREIGN BODY REMOVAL Right 01/02/2013   Procedure: FOREIGN BODY REMOVAL RIGHT INDEX FINGER;  Surgeon: Cammie Sickle., MD;  Location: [facility];  Service: Orthopedics;  Laterality: Right;  . HAMMER TOE SURGERY  11/15   Dr Milinda Pointer  . MASTECTOMY  1984   left   . MASTECTOMY  1985   right  . PARTIAL HYSTERECTOMY  1966   w/ bladder tack  .  ROTATOR CUFF REPAIR  10/09   right   . TOE SURGERY  2001/2002   2nd/3rd,left     Current Outpatient Prescriptions  Medication Sig Dispense Refill  . albuterol (PROVENTIL HFA) 108 (90 BASE) MCG/ACT inhaler Inhale 2 puffs into the lungs every 6 (six) hours as needed.      . cholecalciferol (VITAMIN D) 1000 units tablet Take 5,000 Units by mouth daily.    . cyanocobalamin 500 MCG tablet Take 500 mcg by mouth  daily.    . diphenhydrAMINE (BENADRYL) 25 MG tablet Take 25 mg by mouth every 6 (six) hours as needed.    . flecainide (TAMBOCOR) 50 MG tablet Take 1 tablet (50 mg total) by mouth 2 (two) times daily. 180 tablet 3  . Fluticasone-Salmeterol (ADVAIR DISKUS) 100-50 MCG/DOSE AEPB Inhale 1 puff into the lungs every 12 (twelve) hours.      . montelukast (SINGULAIR) 10 MG tablet Take 10 mg by mouth daily.      . nitroGLYCERIN (NITROSTAT) 0.4 MG SL tablet Place 0.4 mg under the tongue every 5 (five) minutes as needed for chest pain.    Marland Kitchen XARELTO 20 MG TABS tablet TAKE 1 TABLET BY MOUTH EVERY DAY WITH SUPPER 30 tablet 3   No current facility-administered medications for this visit.      Allergies:   Amiodarone and Other   Social History:  The patient  reports that she has never smoked. She has never used smokeless tobacco. She reports that she does not drink alcohol or use drugs.   Family History:   family history includes Arthritis in her brother and mother; Cancer in her maternal grandfather; Colon cancer in her maternal grandfather; Diabetes in her maternal grandmother; Gout in her mother; Heart disease in her father and mother; Heart failure in her father and mother.    Review of Systems: Review of Systems  Constitutional: Positive for malaise/fatigue.  Respiratory: Negative.   Cardiovascular: Negative.   Gastrointestinal: Negative.   Musculoskeletal: Negative.   Skin: Positive for rash.  Neurological: Negative.   Psychiatric/Behavioral: Negative.   All other systems reviewed and are negative.    PHYSICAL EXAM: VS:  BP 140/68 (BP Location: Left Arm, Patient Position: Sitting, Cuff Size: Normal)   Pulse 64   Ht 5\' 8"  (1.727 m)   Wt 147 lb (66.7 kg)   BMI 22.35 kg/m  , BMI Body mass index is 22.35 kg/m. GEN: Well nourished, well developed, in no acute distress  HEENT: normal  Neck: no JVD, carotid bruits, or masses Cardiac: RRR; no murmurs, rubs, or gallops,no edema   Respiratory:  clear to auscultation bilaterally, normal work of breathing GI: soft, nontender, nondistended, + BS Leah: no deformity or atrophy  Skin: warm and dry, no rash Neuro:  Strength and sensation are intact Psych: euthymic mood, full affect    Recent Labs: 10/08/2015: ALT 11; BUN 23; Creatinine, Ser 0.88; Hemoglobin 13.3; Platelets 217.0; Potassium 4.6; Sodium 141    Lipid Panel Lab Results  Component Value Date   CHOL 122 01/16/2014   HDL 70 01/16/2014   LDLCALC 38 01/16/2014   TRIG 70 01/16/2014      Wt Readings from Last 3 Encounters:  04/07/16 147 lb (66.7 kg)  11/10/15 152 lb (68.9 kg)  10/18/15 152 lb (68.9 kg)       ASSESSMENT AND PLAN:  PAF (paroxysmal atrial fibrillation) ([place]) - Plan: EKG 12-Lead Denies having significant arrhythmia, no changes to her medications  ideally would like to add low-dose  beta blocker with the flecainide,Heart rate has been low in the past. We'll hold off on any medications at this time  SVT/ PSVT/ PAT Denies having any significant arrhythmia, no medication changes made  Atherosclerosis of native coronary artery of native heart with angina pectoris  (HCC) Currently with no symptoms of angina. No further workup at this time. Continue current medication regimen.  Memory loss:  Discussed retirement, Recommended regular exercise program, stating social, reading, doing memory exercises    Total encounter time more than 25 minutes  Greater than 50% was spent in counseling and coordination of care with the patient    Disposition:   F/U  12 months   Orders Placed This Encounter  Procedures  . EKG 12-Lead     Signed, Esmond Plants, M.D., Ph.D. 04/07/2016  [place], [place]

## 2016-06-14 ENCOUNTER — Ambulatory Visit (INDEPENDENT_AMBULATORY_CARE_PROVIDER_SITE_OTHER): Payer: PPO | Admitting: Primary Care

## 2016-06-14 ENCOUNTER — Encounter: Payer: PPO | Admitting: Internal Medicine

## 2016-06-14 ENCOUNTER — Encounter: Payer: Self-pay | Admitting: Primary Care

## 2016-06-14 VITALS — BP 140/80 | HR 71 | Temp 98.0°F | Ht 68.0 in | Wt 148.0 lb

## 2016-06-14 DIAGNOSIS — Z0000 Encounter for general adult medical examination without abnormal findings: Secondary | ICD-10-CM

## 2016-06-14 DIAGNOSIS — E2839 Other primary ovarian failure: Secondary | ICD-10-CM

## 2016-06-14 DIAGNOSIS — E538 Deficiency of other specified B group vitamins: Secondary | ICD-10-CM

## 2016-06-14 DIAGNOSIS — R21 Rash and other nonspecific skin eruption: Secondary | ICD-10-CM

## 2016-06-14 DIAGNOSIS — I2510 Atherosclerotic heart disease of native coronary artery without angina pectoris: Secondary | ICD-10-CM | POA: Diagnosis not present

## 2016-06-14 DIAGNOSIS — Z7189 Other specified counseling: Secondary | ICD-10-CM

## 2016-06-14 DIAGNOSIS — Z23 Encounter for immunization: Secondary | ICD-10-CM

## 2016-06-14 DIAGNOSIS — E785 Hyperlipidemia, unspecified: Secondary | ICD-10-CM

## 2016-06-14 LAB — BASIC METABOLIC PANEL
BUN: 17 mg/dL (ref 6–23)
CO2: 30 mEq/L (ref 19–32)
Calcium: 9.9 mg/dL (ref 8.4–10.5)
Chloride: 105 mEq/L (ref 96–112)
Creatinine, Ser: 0.82 mg/dL (ref 0.40–1.20)
GFR: 70.19 mL/min (ref >60.00)
Glucose, Bld: 105 mg/dL — ABNORMAL HIGH (ref 70–99)
Potassium: 4.5 mEq/L (ref 3.5–5.1)
Sodium: 141 mEq/L (ref 135–145)

## 2016-06-14 LAB — LIPID PANEL
Cholesterol: 196 mg/dL (ref 0–200)
HDL: 65.3 mg/dL (ref >39.00)
LDL Cholesterol: 115 mg/dL — ABNORMAL HIGH (ref 0–99)
NonHDL: 130.74
Total CHOL/HDL Ratio: 3
Triglycerides: 80 mg/dL (ref 0.0–149.0)
VLDL: 16 mg/dL (ref 0.0–40.0)

## 2016-06-14 LAB — VITAMIN B12: Vitamin B-12: 1051 pg/mL — ABNORMAL HIGH (ref 211–911)

## 2016-06-14 MED ORDER — TRIAMCINOLONE ACETONIDE 0.1 % EX CREA: 1.0000 "application " | TOPICAL_CREAM | Freq: Two times a day (BID) | CUTANEOUS | 0 refills | Status: DC

## 2016-06-14 NOTE — Progress Notes (Signed)
Patient ID: Leah Hayden, female   DOB: 1927/10/08, 82 y.o.   MRN: [id number]  Leah Hayden is an 82 year old female who presents today for her annual wellness visit  HPI:  Past Medical History:  Diagnosis Date  . Allergic rhinitis   . Asthma   . Biceps tendon tear    a. right->s/p surgical repair winter of 2014.  . Diverticulosis of colon   . GERD (gastroesophageal reflux disease)   . Hammer toe of right foot   . Hx of colonic polyps   . HX: breast cancer   . Meniere's disease   . NSTEMI (non-ST elevated myocardial infarction) ([place])    a. 11/2012 in setting of rapid afib;  b. 11/2012 Cath: nl EF, nonobs CAD->Med Rx;  b. 11/2012 Echo: EF 60-65%, mild LVH, mild MR/TR.  . Osteoarthritis   . PAF (paroxysmal atrial fibrillation) ([place])    a. Eliquis and amio initiated 11/2012 in setting of admission with RVR and NSTEMI;  b. Subsequently changed to flecainide and xarelto;  c. 11/2013 Echo: EF 60-65%, mild LVH, mold MR/TR.  Marland Kitchen Sinus brady-tachy syndrome ([place])    a. Post-conversion pause of 7.2 seconds during 11/2012 hospitalization @ [facility].    Current Outpatient Prescriptions  Medication Sig Dispense Refill  . albuterol (PROVENTIL HFA) 108 (90 BASE) MCG/ACT inhaler Inhale 2 puffs into the lungs every 6 (six) hours as needed.      . cholecalciferol (VITAMIN D) 1000 units tablet Take 5,000 Units by mouth daily.    . cyanocobalamin 500 MCG tablet Take 500 mcg by mouth daily.    . diphenhydrAMINE (BENADRYL) 25 MG tablet Take 25 mg by mouth every 6 (six) hours as needed.    . flecainide (TAMBOCOR) 50 MG tablet Take 1 tablet (50 mg total) by mouth 2 (two) times daily. 180 tablet 3  . Fluticasone-Salmeterol (ADVAIR DISKUS) 100-50 MCG/DOSE AEPB Inhale 1 puff into the lungs every 12 (twelve) hours.      . montelukast (SINGULAIR) 10 MG tablet Take 10 mg by mouth daily.      . nitroGLYCERIN (NITROSTAT) 0.4 MG SL tablet Place 0.4 mg under the tongue every 5 (five) minutes as needed for chest pain.    Marland Kitchen XARELTO  20 MG TABS tablet TAKE 1 TABLET BY MOUTH EVERY DAY WITH SUPPER 30 tablet 3   No current facility-administered medications for this visit.     Allergies  Allergen Reactions  . Amiodarone Other (See Comments)    Chest discomfort  . Other Rash    oramycin    Family History  Problem Relation Age of Onset  . Heart failure Mother   . Gout Mother   . Heart disease Mother     heart failure  . Arthritis Mother   . Heart failure Father   . Heart disease Father     heart failure  . Diabetes Maternal Grandmother   . Colon cancer Maternal Grandfather   . Cancer Maternal Grandfather     colon  . Arthritis Brother     Social History   Social History  . Marital status: Widowed    Spouse name: N/A  . Number of children: 2  . Years of education: N/A   Occupational History  . Lab Scientist, research (life sciences)   Social History Main Topics  . Smoking status: Never Smoker  . Smokeless tobacco: Never Used  . Alcohol use No  . Drug use: No  . Sexual activity: Not  on file   Other Topics Concern  . Not on file   Social History Narrative   Now has living will   Sons are health care POAs   Has DNR   No tube feeds if cognitively unaware    Hospitiliaztions: None  Health Maintenance:    Flu: Due.  Tetanus: Completed in 2009  Pneumovax: Completed in 1999  Prevnar: Completed in 2015  Zostavax: Completed in 2011  Bone Density: Completed years ago.   Colonoscopy: Completed in 2009  Eye Doctor: Completes annually. Recent visit June 2017.  Dental Exam: Completes semi-annually  Mammogram: Bilateral Mastectomy   Pap: Hysterectomy      Providers: Dr. [name], PCP; Dr. Donneta Romberg, Allergist; Dr. Rockey Situ, Cardiology   I have personally reviewed and have noted: 1. The patient's medical and social history 2. Their use of alcohol, tobacco or illicit drugs 3. Their current medications and supplements 4. The patient's functional ability including ADL's, fall risks, home safety  risks  and hearing or visual impairment. 5. Diet and physical activities 6. Evidence for depression or mood disorder  Subjective:   Review of Systems:   Constitutional: Denies fever, malaise, fatigue, headache or abrupt weight changes.  HEENT: Denies eye pain, eye redness, ear pain, ringing in the ears, wax buildup, runny nose, nasal congestion, bloody nose, or sore throat. Respiratory: Denies difficulty breathing, shortness of breath, cough or sputum production.   Cardiovascular: Denies chest pain, chest tightness, palpitations or swelling in the hands or feet.  Gastrointestinal: Denies abdominal pain, bloating, diarrhea or blood in the stool. She does experience constipation (3 times weekly). GU: Denies urgency, frequency, pain with urination, burning sensation, blood in urine, odor or discharge. Musculoskeletal: Denies decrease in range of motion, difficulty with gait, muscle pain or joint pain and swelling.  Skin: Rash to right lower extremity x 1 year. Some improvement with OTC eczema cream. She denies lesions or ulcercations.  Neurological: Denies dizziness, difficulty with speech or problems with balance and coordination. She does experience some difficulty with memory.   No other specific complaints in a complete review of systems (except as listed in HPI above).  Objective:  PE:   Ht 5\' 8"  (1.727 m)   Wt 148 lb (67.1 kg)   BMI 22.50 kg/m  Wt Readings from Last 3 Encounters:  06/14/16 148 lb (67.1 kg)  04/07/16 147 lb (66.7 kg)  11/10/15 152 lb (68.9 kg)    General: Appears their stated age, well developed, well nourished in NAD. Skin: Warm, dry and intact. No lesions or ulcerations noted. Mild erythema with rash to anterior right lower extremity. No open wounds.  HEENT: Head: normal shape and size; Eyes: sclera white, no icterus, conjunctiva pink, PERRLA and EOMs intact; Ears: Tm's gray and intact, normal light reflex; Nose: mucosa pink and moist, septum midline;  Throat/Mouth: Teeth present, mucosa pink and moist, no exudate, lesions or ulcerations noted.  Neck: Normal range of motion. Neck supple, trachea midline. No massses, lumps or thyromegaly present.  Cardiovascular: Normal rate and rhythm. S1,S2 noted.  No rubs or gallops noted. No JVD or BLE edema. No carotid bruits noted. Mitral murmur present on exam. Pulmonary/Chest: Normal effort and positive vesicular breath sounds. No respiratory distress. No wheezes, rales or ronchi noted.  Abdomen: Soft and nontender. Normal bowel sounds, no bruits noted. No distention or masses noted. Liver, spleen and kidneys non palpable. Musculoskeletal: Normal range of motion. No signs of joint swelling. No difficulty with gait.  Neurological: Alert and oriented. Cranial  nerves II-XII intact. Coordination normal. +DTRs bilaterally. Psychiatric: Mood and affect normal. Behavior is normal. Judgment and thought content normal.     BMET    Component Value Date/Time   NA 141 10/08/2015 0940   NA 142 11/28/2013 1022   K 4.6 10/08/2015 0940   K 4.1 11/28/2013 1022   CL 105 10/08/2015 0940   CL 112 (H) 11/28/2013 1022   CO2 30 10/08/2015 0940   CO2 28 11/28/2013 1022   GLUCOSE 102 (H) 10/08/2015 0940   GLUCOSE 126 (H) 11/28/2013 1022   BUN 23 10/08/2015 0940   BUN 18 11/28/2013 1022   CREATININE 0.88 10/08/2015 0940   CREATININE 0.68 11/28/2013 1022   CALCIUM 9.8 10/08/2015 0940   CALCIUM 8.3 (L) 11/28/2013 1022   GFRNONAA >60 11/28/2013 1022   GFRAA >60 11/28/2013 1022    Lipid Panel     Component Value Date/Time   CHOL 122 01/16/2014 0843   TRIG 70 01/16/2014 0843   HDL 70 01/16/2014 0843   CHOLHDL 1.7 01/16/2014 0843   LDLCALC 38 01/16/2014 0843    CBC    Component Value Date/Time   WBC 5.5 10/08/2015 0940   RBC 4.35 10/08/2015 0940   HGB 13.3 10/08/2015 0940   HGB 12.0 11/29/2013 0523   HCT 40.0 10/08/2015 0940   HCT 36.0 11/29/2013 0523   PLT 217.0 10/08/2015 0940   PLT 156 11/29/2013  0523   MCV 92.0 10/08/2015 0940   MCV 93 11/29/2013 0523   MCH 31.0 11/29/2013 0523   MCHC 33.2 10/08/2015 0940   RDW 13.3 10/08/2015 0940   RDW 13.4 11/29/2013 0523   LYMPHSABS 1.8 10/08/2015 0940   LYMPHSABS 1.5 11/29/2013 0523   MONOABS 0.5 10/08/2015 0940   MONOABS 0.5 11/29/2013 0523   EOSABS 0.2 10/08/2015 0940   EOSABS 0.2 11/29/2013 0523   BASOSABS 0.0 10/08/2015 0940   BASOSABS 0.0 11/29/2013 0523    Hgb A1C Lab Results  Component Value Date   HGBA1C 5.9 11/28/2013      Assessment and Plan:   Medicare Annual Wellness Visit:  Diet: She endorses a healthy diet. Breakfast: Oatmeal, egg, sausage Lunch: Sandwich (peanut butter, ham, tomato, cheese) Dinner: Scientist, research (physical sciences), vegetable, startch Snacks: Candy Desserts: Daily Beverages: Water, occasional soda Physical activity: Active Depression/mood screen: Negative Hearing: Intact to whispered voice Visual acuity: Grossly normal, performs annual eye exam  ADLs: Capable Fall risk: Yes, discussed precautions. Home safety: Feels safe at home. Family lives down the road. Cognitive evaluation: Intact to orientation, naming, recall and repetition EOL planning: Adv directives, HCPA [place] and [employer]. No Code, has DNR.   Preventative Medicine: Immunizations UTD, influenza vaccination provided today. Completes annual eye exam. Colonoscopy UTD. Bone density scan ordered as she is ambulatory and active. Overall fair diet, discussed to reduce sweets, increase vegetables. Active in her community and also around her house. Exam with cerumen impaction to right canal, unable to irrigate given lack of tolerability. Discussed Debrox drops. Exam unremarkable. Labs pending.  Next appointment: 1 year for [place].

## 2016-06-14 NOTE — Assessment & Plan Note (Signed)
On file. DNR.

## 2016-06-14 NOTE — Assessment & Plan Note (Signed)
Eczema like rash to right lower anterior extremity.  Some improvement with OTC "eczema cream". Low dose triamcinolone cream sent to pharmacy. Follow up PRN.

## 2016-06-14 NOTE — Patient Instructions (Addendum)
Complete lab work prior to leaving today. I will notify you of your results once received.   You were provided with an influenza vaccination today.  Increase consumption of vegetables, fruit, whole grains. Reduce intake of sweets/desserts.  Continue regular activity.  You will be contacted regarding your bone density testing.  Please let us know if you have not heard back within one week.   I sent a prescription for Triamcinolone cream to your pharmacy. Apply this twice daily to your rash as needed.   Follow up as typically scheduled with Dr. [name]. It was a pleasure to see you today!

## 2016-06-14 NOTE — Assessment & Plan Note (Signed)
Lipid panel pending. No current medication management.

## 2016-06-14 NOTE — Progress Notes (Signed)
Pre visit review using our clinic review tool, if applicable. No additional management support is needed unless otherwise documented below in the visit note. 

## 2016-06-14 NOTE — Assessment & Plan Note (Signed)
Immunizations UTD, influenza vaccination provided today. Completes annual eye exam. Colonoscopy UTD. Bone density scan ordered as she is ambulatory and active. Overall fair diet, discussed to reduce sweets, increase vegetables. Active in her community and also around her house. Exam with cerumen impaction to right canal, unable to irrigate given lack of tolerability. Discussed Debrox drops. Exam unremarkable. Labs pending.  I have personally reviewed and have noted: 1. The patient's medical and social history 2. Their use of alcohol, tobacco or illicit drugs 3. Their current medications and supplements 4. The patient's functional ability including ADL's, fall  risks, home safety risks and hearing or visual  impairment. 5. Diet and physical activities 6. Evidence for depression or mood disorder  Follow up in 1 year for repeat physical.

## 2016-06-26 ENCOUNTER — Telehealth: Payer: Self-pay | Admitting: Internal Medicine

## 2016-06-26 NOTE — Telephone Encounter (Signed)
Spoke with pt about scheduling her bone density.  She wanted to know at her age does she really need to have this done?

## 2016-06-26 NOTE — Telephone Encounter (Signed)
This is completely up to her. If she doesn't wish to pursue this then I understand.  I ordered it as she is very active and mobile.

## 2016-06-27 NOTE — Telephone Encounter (Signed)
Noted  

## 2016-06-27 NOTE — Telephone Encounter (Signed)
Pt declined a bone density test

## 2016-06-27 NOTE — Telephone Encounter (Signed)
Per DPR, left detail message left for patient to return my call. 

## 2016-07-07 ENCOUNTER — Other Ambulatory Visit: Payer: Self-pay | Admitting: Cardiovascular Disease

## 2016-07-12 ENCOUNTER — Other Ambulatory Visit: Payer: Self-pay | Admitting: Cardiovascular Disease

## 2016-10-04 ENCOUNTER — Ambulatory Visit: Payer: PPO | Admitting: Cardiovascular Disease

## 2016-10-20 DIAGNOSIS — H6123 Impacted cerumen, bilateral: Secondary | ICD-10-CM | POA: Diagnosis not present

## 2016-10-20 DIAGNOSIS — H903 Sensorineural hearing loss, bilateral: Secondary | ICD-10-CM | POA: Diagnosis not present

## 2016-10-30 ENCOUNTER — Ambulatory Visit (INDEPENDENT_AMBULATORY_CARE_PROVIDER_SITE_OTHER): Payer: PPO | Admitting: Internal Medicine

## 2016-10-30 ENCOUNTER — Encounter (INDEPENDENT_AMBULATORY_CARE_PROVIDER_SITE_OTHER): Payer: Self-pay

## 2016-10-30 ENCOUNTER — Encounter: Payer: Self-pay | Admitting: Internal Medicine

## 2016-10-30 VITALS — BP 150/76 | HR 67 | Temp 98.0°F | Wt 146.5 lb

## 2016-10-30 DIAGNOSIS — N3000 Acute cystitis without hematuria: Secondary | ICD-10-CM

## 2016-10-30 DIAGNOSIS — N3090 Cystitis, unspecified without hematuria: Secondary | ICD-10-CM | POA: Insufficient documentation

## 2016-10-30 LAB — POC URINALSYSI DIPSTICK (AUTOMATED)
Bilirubin, UA: NEGATIVE
Glucose, UA: NEGATIVE
Ketones, UA: NEGATIVE
Protein, UA: NEGATIVE
Spec Grav, UA: 1.01 (ref 1.030–1.035)
Urobilinogen, UA: 0.2 (ref ?–2.0)
pH, UA: 6 (ref 5.0–8.0)

## 2016-10-30 MED ORDER — CEPHALEXIN 500 MG PO CAPS: 500.0000 mg | ORAL_CAPSULE | Freq: Three times a day (TID) | ORAL | 1 refills | Status: DC

## 2016-10-30 NOTE — Progress Notes (Signed)
Pre visit review using our clinic review tool, if applicable. No additional management support is needed unless otherwise documented below in the visit note. 

## 2016-10-30 NOTE — Progress Notes (Signed)
Subjective:    Patient ID: Leah Hayden, female    DOB: Oct 27, 1927, 83 y.o.   MRN: [id number]  HPI Has been generally doing well but now having urinary symptoms  Having "miserable" time when voiding---but now at other times Started 4 days ago and bad 3 days ago Tried OTC phenazopyridine  No fever No N/V No back pain--but it feels achy  Current Outpatient Prescriptions on File Prior to Visit  Medication Sig Dispense Refill  . albuterol (PROVENTIL HFA) 108 (90 BASE) MCG/ACT inhaler Inhale 2 puffs into the lungs every 6 (six) hours as needed.      . cholecalciferol (VITAMIN D) 1000 units tablet Take 5,000 Units by mouth daily.    . cyanocobalamin 500 MCG tablet Take 500 mcg by mouth daily.    . diphenhydrAMINE (BENADRYL) 25 MG tablet Take 25 mg by mouth every 6 (six) hours as needed.    . flecainide (TAMBOCOR) 50 MG tablet TAKE 1 TABLET (50 MG TOTAL) BY MOUTH 2 (TWO) TIMES DAILY. 180 tablet 3  . Fluticasone-Salmeterol (ADVAIR DISKUS) 100-50 MCG/DOSE AEPB Inhale 1 puff into the lungs every 12 (twelve) hours.      . montelukast (SINGULAIR) 10 MG tablet Take 10 mg by mouth daily.      . nitroGLYCERIN (NITROSTAT) 0.4 MG SL tablet Place 0.4 mg under the tongue every 5 (five) minutes as needed for chest pain.    Marland Kitchen triamcinolone cream (KENALOG) 0.1 % Apply 1 application topically 2 (two) times daily. 30 g 0  . XARELTO 20 MG TABS tablet TAKE 1 TABLET BY MOUTH EVERY DAY WITH SUPPER 30 tablet 3   No current facility-administered medications on file prior to visit.     Allergies  Allergen Reactions  . Amiodarone Other (See Comments)    Chest discomfort  . Other Rash    oramycin    Past Medical History:  Diagnosis Date  . Allergic rhinitis   . Asthma   . Biceps tendon tear    a. right->s/p surgical repair winter of 2014.  . Diverticulosis of colon   . GERD (gastroesophageal reflux disease)   . Hammer toe of right foot   . Hx of colonic polyps   . HX: breast cancer   . Meniere's  disease   . NSTEMI (non-ST elevated myocardial infarction) ([place])    a. 11/2012 in setting of rapid afib;  b. 11/2012 Cath: nl EF, nonobs CAD->Med Rx;  b. 11/2012 Echo: EF 60-65%, mild LVH, mild MR/TR.  . Osteoarthritis   . PAF (paroxysmal atrial fibrillation) ([place])    a. Eliquis and amio initiated 11/2012 in setting of admission with RVR and NSTEMI;  b. Subsequently changed to flecainide and xarelto;  c. 11/2013 Echo: EF 60-65%, mild LVH, mold MR/TR.  Marland Kitchen Sinus brady-tachy syndrome ([place])    a. Post-conversion pause of 7.2 seconds during 11/2012 hospitalization @ [facility].    Past Surgical History:  Procedure Laterality Date  . ABDOMINAL HYSTERECTOMY    . CARDIAC CATHETERIZATION  11/2013   [facility]  . [place]   2nd to left toe  . ESOPHAGOGASTRODUODENOSCOPY  10/1999   inflammation only   . FOREIGN BODY REMOVAL Right 01/02/2013   Procedure: FOREIGN BODY REMOVAL RIGHT INDEX FINGER;  Surgeon: Cammie Sickle., MD;  Location: [facility];  Service: Orthopedics;  Laterality: Right;  . HAMMER TOE SURGERY  11/15   Dr Milinda Pointer  . MASTECTOMY  1984   left   . MASTECTOMY  1985   right  . PARTIAL HYSTERECTOMY  1966   w/ bladder tack  . ROTATOR CUFF REPAIR  10/09   right   . TOE SURGERY  2001/2002   2nd/3rd,left     Family History  Problem Relation Age of Onset  . Heart failure Mother   . Gout Mother   . Heart disease Mother     heart failure  . Arthritis Mother   . Heart failure Father   . Heart disease Father     heart failure  . Diabetes Maternal Grandmother   . Colon cancer Maternal Grandfather   . Cancer Maternal Grandfather     colon  . Arthritis Brother     Social History   Social History  . Marital status: Widowed    Spouse name: N/A  . Number of children: 2  . Years of education: N/A   Occupational History  . Lab Scientist, research (life sciences)   Social History Main Topics  . Smoking status: Never Smoker  . Smokeless tobacco: Never  Used  . Alcohol use No  . Drug use: No  . Sexual activity: Not on file   Other Topics Concern  . Not on file   Social History Narrative   Now has living will   Sons are health care POAs   Has DNR   No tube feeds if cognitively unaware   Review of Systems Some loose stools this AM Recent cerumen removal from right ear by Dr Pryor Ochoa Appetite fair--not big    Objective:   Physical Exam  Constitutional: She appears well-nourished. No distress.  Abdominal:  Slight suprapubic tenderness  Musculoskeletal:  No CVA tenderness          Assessment & Plan:

## 2016-10-30 NOTE — Assessment & Plan Note (Signed)
No real systemic symptoms Will give 3 days of cephalexin Would send culture if symptoms persist or recur soon

## 2016-10-30 NOTE — Addendum Note (Signed)
Addended by: Modena Nunnery on: 10/30/2016 02:27 PM   Modules accepted: Orders

## 2016-10-30 NOTE — Patient Instructions (Signed)
Please take the antibiotic for 3 days. Refill and take it 3 more days if your symptoms are not completely gone. Let me know if they continue. Please set up an appointment for your Wellness visit for about 3 months.

## 2016-11-01 ENCOUNTER — Other Ambulatory Visit: Payer: Self-pay | Admitting: Cardiovascular Disease

## 2016-11-06 ENCOUNTER — Other Ambulatory Visit: Payer: Self-pay | Admitting: Cardiovascular Disease

## 2016-11-21 ENCOUNTER — Ambulatory Visit (INDEPENDENT_AMBULATORY_CARE_PROVIDER_SITE_OTHER): Payer: PPO | Admitting: Family Medicine

## 2016-11-21 ENCOUNTER — Encounter: Payer: Self-pay | Admitting: Family Medicine

## 2016-11-21 VITALS — BP 152/62 | HR 59 | Temp 98.6°F | Ht 68.0 in | Wt 145.2 lb

## 2016-11-21 DIAGNOSIS — R410 Disorientation, unspecified: Secondary | ICD-10-CM

## 2016-11-21 DIAGNOSIS — N3000 Acute cystitis without hematuria: Secondary | ICD-10-CM

## 2016-11-21 LAB — POC URINALSYSI DIPSTICK (AUTOMATED)
Bilirubin, UA: NEGATIVE
Blood, UA: NEGATIVE
Glucose, UA: NEGATIVE
Ketones, UA: NEGATIVE
Nitrite, UA: NEGATIVE
Protein, UA: NEGATIVE
Spec Grav, UA: 1.01 (ref 1.030–1.035)
Urobilinogen, UA: 0.2 (ref ?–2.0)
pH, UA: 6.5 (ref 5.0–8.0)

## 2016-11-21 MED ORDER — CEPHALEXIN 500 MG PO CAPS: 500.0000 mg | ORAL_CAPSULE | Freq: Three times a day (TID) | ORAL | 0 refills | Status: DC

## 2016-11-21 NOTE — Patient Instructions (Addendum)
I think your urine infection may be partially treated  I am sending your urine for a culture  Drink lots of water -this helps  Continue the antibiotic (cephalexin) three times daily until we get your culture back    (I sent it in to the [facility] in [place])  Take it with meals  If your symptoms worsen in the meantime- let us know  Take it easy if you feel wobbly - keep family with you        Urinary Tract Infection, Adult A urinary tract infection (UTI) is an infection of any part of the urinary tract. The urinary tract includes the:  Kidneys.  Ureters.  Bladder.  Urethra. These organs make, store, and get rid of pee (urine) in the body. Follow these instructions at home:  Take over-the-counter and prescription medicines only as told by your doctor.  If you were prescribed an antibiotic medicine, take it as told by your doctor. Do not stop taking the antibiotic even if you start to feel better.  Avoid the following drinks:  Alcohol.  Caffeine.  Tea.  Carbonated drinks.  Drink enough fluid to keep your pee clear or pale yellow.  Keep all follow-up visits as told by your doctor. This is important.  Make sure to:  Empty your bladder often and completely. Do not to hold pee for long periods of time.  Empty your bladder before and after sex.  Wipe from front to back after a bowel movement if you are female. Use each tissue one time when you wipe. Contact a doctor if:  You have back pain.  You have a fever.  You feel sick to your stomach (nauseous).  You throw up (vomit).  Your symptoms do not get better after 3 days.  Your symptoms go away and then come back. Get help right away if:  You have very bad back pain.  You have very bad lower belly (abdominal) pain.  You are throwing up and cannot keep down any medicines or water. This information is not intended to replace advice given to you by your health care provider. Make sure you discuss any questions  you have with your health care provider. Document Released: 01/17/2008 Document Revised: 01/06/2016 Document Reviewed: 06/21/2015 Elsevier Interactive Patient Education  2017 [employer].

## 2016-11-21 NOTE — Assessment & Plan Note (Signed)
First episode resolved in march with 3 d of cephalexin Some symptoms have returned and she started another course- but ran out  UA not as positive as expected with partial tx  Sent for cx  Will take cephalexin tid until result  Will watch closely for inc confusion/ balance problems/ nausea and worsened urinary symptoms  She will update if any symptoms worsen and family is helping her out

## 2016-11-21 NOTE — Progress Notes (Signed)
Pre visit review using our clinic review tool, if applicable. No additional management support is needed unless otherwise documented below in the visit note. 

## 2016-11-21 NOTE — Progress Notes (Signed)
Subjective:    Patient ID: Leah Hayden, female    DOB: 05-28-1928, 83 y.o.   MRN: [id number]  HPI 83 yo female pt of Dr [name] here with uti symptoms   She was treated for uti at a visit 10/30/16 with 3 d of cephalexin  (UA with mod leuk and nitrites and rbc) It is unclear whether she took all of the medication as directed    Last week she felt worn out and nauseated - also burning to urinate  Frequent urination-that is now improved  She refilled the cephalexin- has taken it partially (all but 3 d) She reports fatigue Also more confusion (trouble concentrating) - feels "a little crazy" Also wobbly - son is helping her   No blood in urine  No urine odor  No incontinence  Low back is a bit achey/sore -improved yesterday  No fever  Bladder felt full and tight yesterday- this is now improved  She took 2 doses of cephalexin yesterday-missed 3rd dose and did not take one today    Today UA shows trace of leukocytes  Results for orders placed or performed in visit on 11/21/16  POCT Urinalysis Dipstick (Automated)  Result Value Ref Range   Color, UA Light Yellow    Clarity, UA Clear    Glucose, UA Negative    Bilirubin, UA Negative    Ketones, UA Negative    Spec Grav, UA 1.010 1.030 - 1.035   Blood, UA Negative    pH, UA 6.5 5.0 - 8.0   Protein, UA Negative    Urobilinogen, UA 0.2 Negative - 2.0   Nitrite, UA Negative    Leukocytes, UA Trace (A) Negative    Patient Active Problem List   Diagnosis Date Noted  . Acute cystitis without hematuria 10/30/2016  . Medicare annual wellness visit, subsequent 06/14/2016  . Vitamin B12 deficiency 11/10/2015  . Memory loss 10/08/2015  . Episodic memory loss 10/05/2015  . Left foot pain 09/27/2015  . Atherosclerotic heart disease of native coronary artery with angina pectoris ([place]) 06/07/2015  . Advance directive discussed with patient 06/07/2015  . Bradycardia 10/29/2014  . Rash and nonspecific skin eruption 05/26/2014  .  Abnormal weight loss 05/26/2014  . Hyperlipidemia 05/13/2014  . Hammertoe 04/23/2014  . PAF (paroxysmal atrial fibrillation) ([place])   . Routine general medical examination at a health care facility 05/03/2012  . SVT/ PSVT/ PAT 01/10/2010  . SICK SINUS/ TACHY-BRADY SYNDROME 01/10/2010  . Generalized osteoarthrosis, involving multiple sites 12/18/2007  . ACTINIC KERATOSIS 03/14/2007  . ALLERGIC RHINITIS 03/07/2007  . Mild intermittent asthma in adult without complication 78/29/5621  . GERD 03/07/2007  . DIVERTICULOSIS, COLON 03/07/2007  . BREAST CANCER, HX OF 03/07/2007  . COLONIC POLYPS, HX OF 03/07/2007   Past Medical History:  Diagnosis Date  . Allergic rhinitis   . Asthma   . Biceps tendon tear    a. right->s/p surgical repair winter of 2014.  . Diverticulosis of colon   . GERD (gastroesophageal reflux disease)   . Hammer toe of right foot   . Hx of colonic polyps   . HX: breast cancer   . Meniere's disease   . NSTEMI (non-ST elevated myocardial infarction) ([place])    a. 11/2012 in setting of rapid afib;  b. 11/2012 Cath: nl EF, nonobs CAD->Med Rx;  b. 11/2012 Echo: EF 60-65%, mild LVH, mild MR/TR.  . Osteoarthritis   . PAF (paroxysmal atrial fibrillation) ([facility])    a. Eliquis and amio initiated  11/2012 in setting of admission with RVR and NSTEMI;  b. Subsequently changed to flecainide and xarelto;  c. 11/2013 Echo: EF 60-65%, mild LVH, mold MR/TR.  Marland Kitchen Sinus brady-tachy syndrome ([place])    a. Post-conversion pause of 7.2 seconds during 11/2012 hospitalization @ [facility].   Past Surgical History:  Procedure Laterality Date  . ABDOMINAL HYSTERECTOMY    . CARDIAC CATHETERIZATION  11/2013   [facility]  . [place]   2nd to left toe  . ESOPHAGOGASTRODUODENOSCOPY  10/1999   inflammation only   . FOREIGN BODY REMOVAL Right 01/02/2013   Procedure: FOREIGN BODY REMOVAL RIGHT INDEX FINGER;  Surgeon: Cammie Sickle., MD;  Location: [facility];  Service: Orthopedics;   Laterality: Right;  . HAMMER TOE SURGERY  11/15   Dr Milinda Pointer  . MASTECTOMY  1984   left   . MASTECTOMY  1985   right  . PARTIAL HYSTERECTOMY  1966   w/ bladder tack  . ROTATOR CUFF REPAIR  10/09   right   . TOE SURGERY  2001/2002   2nd/3rd,left    Social History  Substance Use Topics  . Smoking status: Never Smoker  . Smokeless tobacco: Never Used  . Alcohol use No   Family History  Problem Relation Age of Onset  . Heart failure Mother   . Gout Mother   . Heart disease Mother     heart failure  . Arthritis Mother   . Heart failure Father   . Heart disease Father     heart failure  . Diabetes Maternal Grandmother   . Colon cancer Maternal Grandfather   . Cancer Maternal Grandfather     colon  . Arthritis Brother    Allergies  Allergen Reactions  . Amiodarone Other (See Comments)    Chest discomfort  . Other Rash    oramycin   Current Outpatient Prescriptions on File Prior to Visit  Medication Sig Dispense Refill  . albuterol (PROVENTIL HFA) 108 (90 BASE) MCG/ACT inhaler Inhale 2 puffs into the lungs every 6 (six) hours as needed.      . cholecalciferol (VITAMIN D) 1000 units tablet Take 5,000 Units by mouth daily.    . cyanocobalamin 500 MCG tablet Take 500 mcg by mouth daily.    . diphenhydrAMINE (BENADRYL) 25 MG tablet Take 25 mg by mouth every 6 (six) hours as needed.    . flecainide (TAMBOCOR) 50 MG tablet TAKE 1 TABLET (50 MG TOTAL) BY MOUTH 2 (TWO) TIMES DAILY. 180 tablet 3  . Fluticasone-Salmeterol (ADVAIR DISKUS) 100-50 MCG/DOSE AEPB Inhale 1 puff into the lungs every 12 (twelve) hours.      . montelukast (SINGULAIR) 10 MG tablet Take 10 mg by mouth daily.      . nitroGLYCERIN (NITROSTAT) 0.4 MG SL tablet Place 0.4 mg under the tongue every 5 (five) minutes as needed for chest pain.    Marland Kitchen triamcinolone cream (KENALOG) 0.1 % Apply 1 application topically 2 (two) times daily. 30 g 0  . XARELTO 20 MG TABS tablet TAKE 1 TABLET BY MOUTH EVERY DAY WITH SUPPER 30  tablet 3   No current facility-administered medications on file prior to visit.      Review of Systems Review of Systems  Constitutional: Negative for fever,  and unexpected weight change. pos for fatigue/malaise and loss of appetite  Eyes: Negative for pain and visual disturbance.  Respiratory: Negative for cough and shortness of breath.   Cardiovascular: Negative for cp or  palpitations    Gastrointestinal: Negative for , diarrhea and constipation. pos for nausea that is improved today Genitourinary: pos for urgency and frequency. neg for dysuria today Skin: Negative for pallor or rash   Neurological: Negative for weakness, light-headedness, numbness and headaches.  Hematological: Negative for adenopathy. Does not bruise/bleed easily.  Psychiatric/Behavioral: Negative for dysphoric mood. The patient is not nervous/anxious.  pos for mental fuzziness/confusion at times        Objective:   Physical Exam  Constitutional: She appears well-developed and well-nourished. No distress.  Frail appearing elderly female  HENT:  Head: Normocephalic and atraumatic.  Eyes: Conjunctivae and EOM are normal. Pupils are equal, round, and reactive to light.  Neck: Normal range of motion. Neck supple.  Cardiovascular: Normal rate and normal heart sounds.   Pulmonary/Chest: Effort normal and breath sounds normal.  Abdominal: Soft. Bowel sounds are normal. She exhibits no distension. There is tenderness. There is no rebound.  No cva tenderness  Mild suprapubic tenderness  Musculoskeletal: She exhibits no edema.  Lymphadenopathy:    She has no cervical adenopathy.  Neurological: She is alert. No cranial nerve deficit. She exhibits normal muscle tone. Coordination normal.  Skin: No rash noted.  Psychiatric: She has a normal mood and affect.  Talkative and pleasant  Suspect mild cognitive slowing           Assessment & Plan:   Problem List Items Addressed This Visit      Genitourinary    Acute cystitis without hematuria - Primary    First episode resolved in march with 3 d of cephalexin Some symptoms have returned and she started another course- but ran out  UA not as positive as expected with partial tx  Sent for cx  Will take cephalexin tid until result  Will watch closely for inc confusion/ balance problems/ nausea and worsened urinary symptoms  She will update if any symptoms worsen and family is helping her out      Relevant Orders   Urine culture    Other Visit Diagnoses    Confusion       Relevant Orders   POCT Urinalysis Dipstick (Automated) (Completed)

## 2016-11-22 LAB — URINE CULTURE: Organism ID, Bacteria: NO GROWTH

## 2016-11-27 ENCOUNTER — Encounter: Payer: Self-pay | Admitting: Internal Medicine

## 2016-11-27 ENCOUNTER — Ambulatory Visit (INDEPENDENT_AMBULATORY_CARE_PROVIDER_SITE_OTHER): Payer: PPO | Admitting: Internal Medicine

## 2016-11-27 VITALS — BP 124/62 | HR 67 | Temp 98.5°F | Wt 146.8 lb

## 2016-11-27 DIAGNOSIS — R300 Dysuria: Secondary | ICD-10-CM | POA: Diagnosis not present

## 2016-11-27 DIAGNOSIS — R5383 Other fatigue: Secondary | ICD-10-CM

## 2016-11-27 MED ORDER — ESTRADIOL 0.1 MG/GM VA CREA
1.0000 | TOPICAL_CREAM | VAGINAL | 5 refills | Status: DC
Start: 2016-11-27 — End: 2018-04-12

## 2016-11-27 NOTE — Assessment & Plan Note (Signed)
May be vaginal  Will try estrogen cream

## 2016-11-27 NOTE — Progress Notes (Signed)
Subjective:    Patient ID: Leah Hayden, female    DOB: 1928/02/24, 83 y.o.   MRN: [id number]  HPI Here for follow up of urinary symptoms Here with DIL  No energy--though she feels she is eating okay Some confusion in past--- some better with the B12 supplements Some pain in right lower back Nausea while at church yesterday BP up slightly--but then back to normal soon after  Bottom was sore this morning---then it resolved Has some burning but seems to be in the vagina  No chest pain No SOB Dizzy at times  Current Outpatient Prescriptions on File Prior to Visit  Medication Sig Dispense Refill  . albuterol (PROVENTIL HFA) 108 (90 BASE) MCG/ACT inhaler Inhale 2 puffs into the lungs every 6 (six) hours as needed.      . cholecalciferol (VITAMIN D) 1000 units tablet Take 5,000 Units by mouth daily.    . cyanocobalamin 500 MCG tablet Take 500 mcg by mouth daily.    . diphenhydrAMINE (BENADRYL) 25 MG tablet Take 25 mg by mouth every 6 (six) hours as needed.    . flecainide (TAMBOCOR) 50 MG tablet TAKE 1 TABLET (50 MG TOTAL) BY MOUTH 2 (TWO) TIMES DAILY. 180 tablet 3  . Fluticasone-Salmeterol (ADVAIR DISKUS) 100-50 MCG/DOSE AEPB Inhale 1 puff into the lungs every 12 (twelve) hours.      . montelukast (SINGULAIR) 10 MG tablet Take 10 mg by mouth daily.      . nitroGLYCERIN (NITROSTAT) 0.4 MG SL tablet Place 0.4 mg under the tongue every 5 (five) minutes as needed for chest pain.    Marland Kitchen triamcinolone cream (KENALOG) 0.1 % Apply 1 application topically 2 (two) times daily. 30 g 0  . XARELTO 20 MG TABS tablet TAKE 1 TABLET BY MOUTH EVERY DAY WITH SUPPER 30 tablet 3   No current facility-administered medications on file prior to visit.     Allergies  Allergen Reactions  . Amiodarone Other (See Comments)    Chest discomfort  . Other Rash    oramycin    Past Medical History:  Diagnosis Date  . Allergic rhinitis   . Asthma   . Biceps tendon tear    a. right->s/p surgical repair  winter of 2014.  . Diverticulosis of colon   . GERD (gastroesophageal reflux disease)   . Hammer toe of right foot   . Hx of colonic polyps   . HX: breast cancer   . Meniere's disease   . NSTEMI (non-ST elevated myocardial infarction) ([place])    a. 11/2012 in setting of rapid afib;  b. 11/2012 Cath: nl EF, nonobs CAD->Med Rx;  b. 11/2012 Echo: EF 60-65%, mild LVH, mild MR/TR.  . Osteoarthritis   . PAF (paroxysmal atrial fibrillation) ([place])    a. Eliquis and amio initiated 11/2012 in setting of admission with RVR and NSTEMI;  b. Subsequently changed to flecainide and xarelto;  c. 11/2013 Echo: EF 60-65%, mild LVH, mold MR/TR.  Marland Kitchen Sinus brady-tachy syndrome ([place])    a. Post-conversion pause of 7.2 seconds during 11/2012 hospitalization @ [facility].    Past Surgical History:  Procedure Laterality Date  . ABDOMINAL HYSTERECTOMY    . CARDIAC CATHETERIZATION  [facility]   [facility]  . [place]   2nd to left toe  . ESOPHAGOGASTRODUODENOSCOPY  10/1999   inflammation only   . FOREIGN BODY REMOVAL Right 01/02/2013   Procedure: FOREIGN BODY REMOVAL RIGHT INDEX FINGER;  Surgeon: Cammie Sickle., MD;  Location:  [facility];  Service: Orthopedics;  Laterality: Right;  . HAMMER TOE SURGERY  11/15   Dr Milinda Pointer  . MASTECTOMY  1984   left   . MASTECTOMY  1985   right  . PARTIAL HYSTERECTOMY  1966   w/ bladder tack  . ROTATOR CUFF REPAIR  10/09   right   . TOE SURGERY  2001/2002   2nd/3rd,left     Family History  Problem Relation Age of Onset  . Heart failure Mother   . Gout Mother   . Heart disease Mother     heart failure  . Arthritis Mother   . Heart failure Father   . Heart disease Father     heart failure  . Diabetes Maternal Grandmother   . Colon cancer Maternal Grandfather   . Cancer Maternal Grandfather     colon  . Arthritis Brother     Social History   Social History  . Marital status: Widowed    Spouse name: N/A  . Number of children: 2  . Years of  education: N/A   Occupational History  . Lab Scientist, research (life sciences)   Social History Main Topics  . Smoking status: Never Smoker  . Smokeless tobacco: Never Used  . Alcohol use No  . Drug use: No  . Sexual activity: Not on file   Other Topics Concern  . Not on file   Social History Narrative   Now has living will   Sons are health care POAs   Has DNR   No tube feeds if cognitively unaware   Review of Systems No trouble sleeping appetite is okay Weight is stable No edema Bloated in stomach at times Constipated--nothing new (usually every 2-3 days) Retired now for about 2 years--not doing a lot    Objective:   Physical Exam  Constitutional: She appears well-nourished. No distress.  HENT:  Mouth/Throat: Oropharynx is clear and moist. No oropharyngeal exudate.  Neck: No thyromegaly present.  Cardiovascular: Normal rate and regular rhythm.  Exam reveals no gallop.   Soft aortic systolic murmur  Pulmonary/Chest: Effort normal and breath sounds normal. No respiratory distress. She has no wheezes. She has no rales.  Abdominal: Soft. There is no tenderness.  Genitourinary:  Genitourinary Comments: Irritation and atrophy of labia and vaginal entrance---but no lesions  Musculoskeletal: She exhibits no edema.  Lymphadenopathy:    She has no cervical adenopathy.  Skin: No rash noted. No erythema.  Psychiatric: She has a normal mood and affect. Her behavior is normal.          Assessment & Plan:

## 2016-11-27 NOTE — Progress Notes (Signed)
Pre visit review using our clinic review tool, if applicable. No additional management support is needed unless otherwise documented below in the visit note. 

## 2016-11-27 NOTE — Assessment & Plan Note (Signed)
Non specific No recent weight loss, fever, etc ?related to being stuck inside Will check labs Exam is reassuring No clear mood issues--but may be an issue Does have some early cognitive decline--but wouldn't explain her symptoms Okay taking her meds

## 2016-11-28 ENCOUNTER — Telehealth: Payer: Self-pay

## 2016-11-28 LAB — COMPREHENSIVE METABOLIC PANEL
ALT: 11 U/L (ref 0–35)
AST: 15 U/L (ref 0–37)
Albumin: 4.2 g/dL (ref 3.5–5.2)
Alkaline Phosphatase: 69 U/L (ref 39–117)
BUN: 20 mg/dL (ref 6–23)
CO2: 33 mEq/L — ABNORMAL HIGH (ref 19–32)
Calcium: 9.8 mg/dL (ref 8.4–10.5)
Chloride: 101 mEq/L (ref 96–112)
Creatinine, Ser: 0.93 mg/dL (ref 0.40–1.20)
GFR: 60.64 mL/min (ref >60.00)
Glucose, Bld: 92 mg/dL (ref 70–99)
Potassium: 4.6 mEq/L (ref 3.5–5.1)
Sodium: 138 mEq/L (ref 135–145)
Total Bilirubin: 0.9 mg/dL (ref 0.2–1.2)
Total Protein: 6.9 g/dL (ref 6.0–8.3)

## 2016-11-28 LAB — CBC WITH DIFFERENTIAL/PLATELET
Basophils Absolute: 0 10*3/uL (ref 0.0–0.1)
Basophils Relative: 0.4 % (ref 0.0–3.0)
Eosinophils Absolute: 0.1 10*3/uL (ref 0.0–0.7)
Eosinophils Relative: 1.4 % (ref 0.0–5.0)
HCT: 40.8 % (ref 36.0–46.0)
Hemoglobin: 13.4 g/dL (ref 12.0–15.0)
Lymphocytes Relative: 14.5 % (ref 12.0–46.0)
Lymphs Abs: 1.4 10*3/uL (ref 0.7–4.0)
MCHC: 32.9 g/dL (ref 30.0–36.0)
MCV: 93.1 fl (ref 78.0–100.0)
Monocytes Absolute: 0.7 10*3/uL (ref 0.1–1.0)
Monocytes Relative: 7.3 % (ref 3.0–12.0)
Neutro Abs: 7.2 10*3/uL (ref 1.4–7.7)
Neutrophils Relative %: 76.4 % (ref 43.0–77.0)
Platelets: 203 10*3/uL (ref 150.0–400.0)
RBC: 4.39 Mil/uL (ref 3.87–5.11)
RDW: 13.4 % (ref 11.5–15.5)
WBC: 9.4 10*3/uL (ref 4.0–10.5)

## 2016-11-28 NOTE — Telephone Encounter (Signed)
Pt was seen yesterday and wants to verify that estradiol cream is an estrogen cream. I verified that it is and pt voiced understanding and did not need anything else.

## 2016-11-29 LAB — T4, FREE: Free T4: 0.92 ng/dL (ref 0.60–1.60)

## 2016-11-30 ENCOUNTER — Encounter: Payer: Self-pay | Admitting: *Deleted

## 2016-12-25 ENCOUNTER — Ambulatory Visit (INDEPENDENT_AMBULATORY_CARE_PROVIDER_SITE_OTHER): Payer: PPO | Admitting: Internal Medicine

## 2016-12-25 ENCOUNTER — Encounter: Payer: Self-pay | Admitting: Internal Medicine

## 2016-12-25 VITALS — BP 132/70 | HR 62 | Temp 98.1°F | Wt 142.0 lb

## 2016-12-25 DIAGNOSIS — I480 Paroxysmal atrial fibrillation: Secondary | ICD-10-CM | POA: Diagnosis not present

## 2016-12-25 DIAGNOSIS — I25119 Atherosclerotic heart disease of native coronary artery with unspecified angina pectoris: Secondary | ICD-10-CM | POA: Diagnosis not present

## 2016-12-25 DIAGNOSIS — S96911A Strain of unspecified muscle and tendon at ankle and foot level, right foot, initial encounter: Secondary | ICD-10-CM

## 2016-12-25 DIAGNOSIS — R300 Dysuria: Secondary | ICD-10-CM | POA: Diagnosis not present

## 2016-12-25 MED ORDER — NITROGLYCERIN 0.4 MG SL SUBL: 0.4000 mg | SUBLINGUAL_TABLET | SUBLINGUAL | 0 refills | Status: DC | PRN

## 2016-12-25 NOTE — Progress Notes (Signed)
Subjective:    Patient ID: Leah Hayden, female    DOB: 04/05/1928, 83 y.o.   MRN: [id number]  HPI Here for follow up of fatigue  She feels better Moving around better Energy is better May be related to the weather  Now having right foot pain Sore in instep Feels it may have been injured riding the mower too long (pressing down on pedal) Noticed it 2 days--- and had mowed 2 days before Tried soaking in epsom salts May be some better today  No recent chest pain NTG is expired She requests more to have on hand No palpitations Hasn't seen cardiologist lately  Current Outpatient Prescriptions on File Prior to Visit  Medication Sig Dispense Refill  . albuterol (PROVENTIL HFA) 108 (90 BASE) MCG/ACT inhaler Inhale 2 puffs into the lungs every 6 (six) hours as needed.      . cholecalciferol (VITAMIN D) 1000 units tablet Take 5,000 Units by mouth daily.    . cyanocobalamin 500 MCG tablet Take 500 mcg by mouth every other day.     . diphenhydrAMINE (BENADRYL) 25 MG tablet Take 25 mg by mouth every 6 (six) hours as needed.    Marland Kitchen estradiol (ESTRACE) 0.1 MG/GM vaginal cream Place 1 Applicatorful vaginally 2 (two) times a week. 42.5 g 5  . flecainide (TAMBOCOR) 50 MG tablet TAKE 1 TABLET (50 MG TOTAL) BY MOUTH 2 (TWO) TIMES DAILY. 180 tablet 3  . Fluticasone-Salmeterol (ADVAIR DISKUS) 100-50 MCG/DOSE AEPB Inhale 1 puff into the lungs every 12 (twelve) hours.      . montelukast (SINGULAIR) 10 MG tablet Take 10 mg by mouth daily.      . nitroGLYCERIN (NITROSTAT) 0.4 MG SL tablet Place 0.4 mg under the tongue every 5 (five) minutes as needed for chest pain.    Marland Kitchen XARELTO 20 MG TABS tablet TAKE 1 TABLET BY MOUTH EVERY DAY WITH SUPPER 30 tablet 3   No current facility-administered medications on file prior to visit.     Allergies  Allergen Reactions  . Amiodarone Other (See Comments)    Chest discomfort  . Other Rash    oramycin    Past Medical History:  Diagnosis Date  . Allergic  rhinitis   . Asthma   . Biceps tendon tear    a. right->s/p surgical repair winter of 2014.  . Diverticulosis of colon   . GERD (gastroesophageal reflux disease)   . Hammer toe of right foot   . Hx of colonic polyps   . HX: breast cancer   . Meniere's disease   . NSTEMI (non-ST elevated myocardial infarction) ([place])    a. 11/2012 in setting of rapid afib;  b. 11/2012 Cath: nl EF, nonobs CAD->Med Rx;  b. 11/2012 Echo: EF 60-65%, mild LVH, mild MR/TR.  . Osteoarthritis   . PAF (paroxysmal atrial fibrillation) ([place])    a. Eliquis and amio initiated 11/2012 in setting of admission with RVR and NSTEMI;  b. Subsequently changed to flecainide and xarelto;  c. 11/2013 Echo: EF 60-65%, mild LVH, mold MR/TR.  Marland Kitchen Sinus brady-tachy syndrome ([place])    a. Post-conversion pause of 7.2 seconds during 11/2012 hospitalization @ [facility].    Past Surgical History:  Procedure Laterality Date  . ABDOMINAL HYSTERECTOMY    . CARDIAC [facility]HETERIZATION  [facility]   [facility]  . [place]   2nd to left toe  . ESOPHAGOGASTRODUODENOSCOPY  10/1999   inflammation only   . FOREIGN BODY REMOVAL Right 01/02/2013  Procedure: FOREIGN BODY REMOVAL RIGHT INDEX FINGER;  Surgeon: Cammie Sickle., MD;  Location: [facility];  Service: Orthopedics;  Laterality: Right;  . HAMMER TOE SURGERY  11/15   Dr Milinda Pointer  . MASTECTOMY  1984   left   . MASTECTOMY  1985   right  . PARTIAL HYSTERECTOMY  1966   w/ bladder tack  . ROTATOR CUFF REPAIR  10/09   right   . TOE SURGERY  2001/2002   2nd/3rd,left     Family History  Problem Relation Age of Onset  . Heart failure Mother   . Gout Mother   . Heart disease Mother        heart failure  . Arthritis Mother   . Heart failure Father   . Heart disease Father        heart failure  . Diabetes Maternal Grandmother   . Colon cancer Maternal Grandfather   . Cancer Maternal Grandfather        colon  . Arthritis Brother     Social History   Social History    . Marital status: Widowed    Spouse name: N/A  . Number of children: 2  . Years of education: N/A   Occupational History  . Lab Scientist, research (life sciences)   Social History Main Topics  . Smoking status: Never Smoker  . Smokeless tobacco: Never Used  . Alcohol use No  . Drug use: No  . Sexual activity: Not on file   Other Topics Concern  . Not on file   Social History Narrative   Now has living will   Sons are health care POAs   Has DNR   No tube feeds if cognitively unaware   Review of Systems  No fever Doesn't feel sick Has had treatment by dermatologist    Objective:   Physical Exam  Constitutional: No distress.  Musculoskeletal:  Right foot--- no ankle tenderness and normal ROM Mild tenderness in soft tissue along medial arch of foot. No tenderness along any metatarsals or foot bones  Psychiatric: She has a normal mood and affect. Her behavior is normal.          Assessment & Plan:

## 2016-12-25 NOTE — Assessment & Plan Note (Signed)
Mild and seems to be soft tissue only Discussed heat Discussed limiting time on the mower

## 2016-12-25 NOTE — Assessment & Plan Note (Signed)
No recent chest pain Needs refill of her nitro though

## 2016-12-25 NOTE — Progress Notes (Signed)
Pre visit review using our clinic review tool, if applicable. No additional management support is needed unless otherwise documented below in the visit note. 

## 2016-12-25 NOTE — Assessment & Plan Note (Signed)
No symptoms On xarelto 

## 2017-01-16 DIAGNOSIS — J300 Vasomotor rhinitis: Secondary | ICD-10-CM | POA: Diagnosis not present

## 2017-01-16 DIAGNOSIS — R21 Rash and other nonspecific skin eruption: Secondary | ICD-10-CM | POA: Diagnosis not present

## 2017-01-16 DIAGNOSIS — H1045 Other chronic allergic conjunctivitis: Secondary | ICD-10-CM | POA: Diagnosis not present

## 2017-01-16 DIAGNOSIS — Z961 Presence of intraocular lens: Secondary | ICD-10-CM | POA: Diagnosis not present

## 2017-01-16 DIAGNOSIS — J4530 Mild persistent asthma, uncomplicated: Secondary | ICD-10-CM | POA: Diagnosis not present

## 2017-01-22 ENCOUNTER — Other Ambulatory Visit: Payer: Self-pay | Admitting: Internal Medicine

## 2017-01-31 ENCOUNTER — Ambulatory Visit (INDEPENDENT_AMBULATORY_CARE_PROVIDER_SITE_OTHER): Payer: PPO | Admitting: Internal Medicine

## 2017-01-31 ENCOUNTER — Encounter: Payer: Self-pay | Admitting: Internal Medicine

## 2017-01-31 VITALS — BP 124/66 | HR 89 | Temp 98.7°F | Wt 135.5 lb

## 2017-01-31 DIAGNOSIS — E441 Mild protein-calorie malnutrition: Secondary | ICD-10-CM

## 2017-01-31 DIAGNOSIS — F0391 Unspecified dementia with behavioral disturbance: Secondary | ICD-10-CM | POA: Diagnosis not present

## 2017-01-31 NOTE — Assessment & Plan Note (Signed)
Discussed adding supplements--samples given Will recheck in 2-3 weeks

## 2017-01-31 NOTE — Patient Instructions (Signed)
Please have 1-2 cans of ensure (or boost) daily
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
## 2017-01-31 NOTE — Assessment & Plan Note (Signed)
Clear decline and functional problems now Discussed meds/neurology---will hold off Discussed supplements for her weight loss Needs more support at home---aide for instrumental ADLs, med set up, etc

## 2017-02-21 ENCOUNTER — Encounter: Payer: Self-pay | Admitting: Internal Medicine

## 2017-02-21 ENCOUNTER — Ambulatory Visit (INDEPENDENT_AMBULATORY_CARE_PROVIDER_SITE_OTHER): Payer: PPO | Admitting: Internal Medicine

## 2017-02-21 VITALS — BP 128/62 | HR 56 | Temp 98.0°F | Wt 139.0 lb

## 2017-02-21 DIAGNOSIS — I25119 Atherosclerotic heart disease of native coronary artery with unspecified angina pectoris: Secondary | ICD-10-CM | POA: Diagnosis not present

## 2017-02-21 DIAGNOSIS — N3090 Cystitis, unspecified without hematuria: Secondary | ICD-10-CM | POA: Diagnosis not present

## 2017-02-21 DIAGNOSIS — F0390 Unspecified dementia without behavioral disturbance: Secondary | ICD-10-CM | POA: Diagnosis not present

## 2017-02-21 DIAGNOSIS — I480 Paroxysmal atrial fibrillation: Secondary | ICD-10-CM

## 2017-02-21 LAB — POC URINALSYSI DIPSTICK (AUTOMATED)
Bilirubin, UA: NEGATIVE
Blood, UA: NEGATIVE
Glucose, UA: NEGATIVE
Ketones, UA: NEGATIVE
Nitrite, UA: POSITIVE
Protein, UA: NEGATIVE
Spec Grav, UA: >=1.03 — AB (ref 1.010–1.025)
Urobilinogen, UA: 0.2 E.U./dL
pH, UA: 6 (ref 5.0–8.0)

## 2017-02-21 MED ORDER — DONEPEZIL HCL 5 MG PO TABS: 5.0000 mg | ORAL_TABLET | Freq: Every day | ORAL | 11 refills | Status: DC

## 2017-02-21 NOTE — Progress Notes (Signed)
Subjective:    Patient ID: Leah Hayden, female    DOB: 01-01-1928, 83 y.o.   MRN: [id number]  HPI Here with DIL again for follow up of dementia and weight loss  Has been taking her supplements --usually bid Has gained some weight back  She is still doing her medications--will get off track on this at times Has had 2 episodes of increased confusion Seems to have anxiety at times also  Did have spell where her heart didn't feel right Palpitations and pressure sensation Did not remember to try nitro ?low BP (grandson checked) May have taken extra flecainide without realizing  Current Outpatient Prescriptions on File Prior to Visit  Medication Sig Dispense Refill  . albuterol (PROVENTIL HFA) 108 (90 BASE) MCG/ACT inhaler Inhale 2 puffs into the lungs every 6 (six) hours as needed.      . cholecalciferol (VITAMIN D) 1000 units tablet Take 5,000 Units by mouth daily.    . cyanocobalamin 500 MCG tablet Take 500 mcg by mouth every other day.     . estradiol (ESTRACE) 0.1 MG/GM vaginal cream Place 1 Applicatorful vaginally 2 (two) times a week. 42.5 g 5  . flecainide (TAMBOCOR) 50 MG tablet TAKE 1 TABLET (50 MG TOTAL) BY MOUTH 2 (TWO) TIMES DAILY. 180 tablet 3  . Fluticasone-Salmeterol (ADVAIR DISKUS) 100-50 MCG/DOSE AEPB Inhale 1 puff into the lungs every 12 (twelve) hours.      . montelukast (SINGULAIR) 10 MG tablet Take 10 mg by mouth daily.      . nitroGLYCERIN (NITROSTAT) 0.4 MG SL tablet Place 1 tablet (0.4 mg total) under the tongue every 5 (five) minutes as needed for chest pain. 25 tablet 0  . XARELTO 20 MG TABS tablet TAKE 1 TABLET BY MOUTH EVERY DAY WITH SUPPER 30 tablet 3   No current facility-administered medications on file prior to visit.     Allergies  Allergen Reactions  . Amiodarone Other (See Comments)    Chest discomfort  . Other Rash    oramycin    Past Medical History:  Diagnosis Date  . Allergic rhinitis   . Asthma   . Biceps tendon tear    a.  right->s/p surgical repair winter of 2014.  . Diverticulosis of colon   . GERD (gastroesophageal reflux disease)   . Hammer toe of right foot   . Hx of colonic polyps   . HX: breast cancer   . Meniere's disease   . NSTEMI (non-ST elevated myocardial infarction) ([place])    a. 11/2012 in setting of rapid afib;  b. 11/2012 Cath: nl EF, nonobs CAD->Med Rx;  b. 11/2012 Echo: EF 60-65%, mild LVH, mild MR/TR.  . Osteoarthritis   . PAF (paroxysmal atrial fibrillation) ([place])    a. Eliquis and amio initiated 11/2012 in setting of admission with RVR and NSTEMI;  b. Subsequently changed to flecainide and xarelto;  c. 11/2013 Echo: EF 60-65%, mild LVH, mold MR/TR.  Marland Kitchen Sinus brady-tachy syndrome ([place])    a. Post-conversion pause of 7.2 seconds during 11/2012 hospitalization @ [facility].    Past Surgical History:  Procedure Laterality Date  . ABDOMINAL HYSTERECTOMY    . CARDIAC CATHETERIZATION  11/2013   [facility]  . [place]   2nd to left toe  . ESOPHAGOGASTRODUODENOSCOPY  10/1999   inflammation only   . FOREIGN BODY REMOVAL Right 01/02/2013   Procedure: FOREIGN BODY REMOVAL RIGHT INDEX FINGER;  Surgeon: Cammie Sickle., MD;  Location: [facility];  Service: Orthopedics;  Laterality: Right;  . HAMMER TOE SURGERY  11/15   Dr Milinda Pointer  . MASTECTOMY  1984   left   . MASTECTOMY  1985   right  . PARTIAL HYSTERECTOMY  1966   w/ bladder tack  . ROTATOR CUFF REPAIR  10/09   right   . TOE SURGERY  2001/2002   2nd/3rd,left     Family History  Problem Relation Age of Onset  . Heart failure Mother   . Gout Mother   . Heart disease Mother        heart failure  . Arthritis Mother   . Heart failure Father   . Heart disease Father        heart failure  . Diabetes Maternal Grandmother   . Colon cancer Maternal Grandfather   . Cancer Maternal Grandfather        colon  . Arthritis Brother     Social History   Social History  . Marital status: Widowed    Spouse name: N/A  .  Number of children: 2  . Years of education: N/A   Occupational History  . Lab Scientist, research (life sciences)   Social History Main Topics  . Smoking status: Never Smoker  . Smokeless tobacco: Never Used  . Alcohol use No  . Drug use: No  . Sexual activity: Not on file   Other Topics Concern  . Not on file   Social History Narrative   Now has living will   Sons are health care POAs   Has DNR   No tube feeds if cognitively unaware   Review of Systems  Intermittent dysuria still --okay now Appetite is better No N/V     Objective:   Physical Exam  Constitutional: No distress.  Neck: No thyromegaly present.  Cardiovascular: Normal rate, regular rhythm and normal heart sounds.  Exam reveals no gallop.   No murmur heard. Pulmonary/Chest: Effort normal and breath sounds normal. No respiratory distress. She has no wheezes. She has no rales.  Lymphadenopathy:    She has no cervical adenopathy.  Neurological:  Mild confusion  Psychiatric: She has a normal mood and affect. Her behavior is normal.          Assessment & Plan:

## 2017-02-21 NOTE — Assessment & Plan Note (Signed)
No current pain Okay to resume the estrogen cream twice a week

## 2017-02-21 NOTE — Assessment & Plan Note (Signed)
Some chest pressure but didn't take nitro discussed

## 2017-02-21 NOTE — Assessment & Plan Note (Signed)
Worsening functional problems Discussed neuro eval--they will consider Will try donepezil Family needs to set up meds May need supervision soon

## 2017-02-21 NOTE — Patient Instructions (Signed)
You can use the estrogen cream twice a week. Please let your family set up your medications and help you keep your house up. If you try to do too much, it will increase your confusion. Try the donepezil to see if it helps your memory---let me know if it upsets your stomach or makes you feel bad.

## 2017-02-21 NOTE — Assessment & Plan Note (Signed)
May have some paroxysms On the xarelto

## 2017-02-27 ENCOUNTER — Telehealth: Payer: Self-pay | Admitting: Cardiovascular Disease

## 2017-02-27 NOTE — Telephone Encounter (Signed)
Front desk left message for pt's daughter to see if she can come in tomorrow to see Ignacia Bayley, NP.

## 2017-02-27 NOTE — Telephone Encounter (Signed)
Per Grandson mental status has declined in the last couple of months patient has had dizzy weak spells  He is concerned that patient may be going in and out of afib with these episodes and grandson brandon would light long term monitor   He also wants to know if patient could have had a slight stroke causing some of these issues.  He would like patient seen sooner   Grandson not on dpr .  Called jean daughter to schedule sooner appt. Karen Kays to reach [name] for her to call office to confirm sooner appt on 7/25 at 2 pm.

## 2017-02-27 NOTE — Telephone Encounter (Signed)
Grandson would like to see if you will order a long term monitor on her and see if she can be worked in to be seen sooner.

## 2017-03-06 NOTE — Progress Notes (Signed)
Cardiology Office Note  Date:  03/07/2017   ID:  Leah Hayden, DOB 1928/02/05, MRN [id number]  PCP:  Leah Carbon, MD   Chief Complaint  Patient presents with  . other    Pt. c/o being dizzy, off balance, thinks going in and out of A-Fib and is weak with about 3 weeks ago had some chest tightness. Meds reviewed by the pt. verbally.     HPI:  Leah Hayden is a very pleasant 83 year old woman with a history of  SVT, pauses on Holter,  admitted to the hospital August 2012 with atrial fibrillation  converted shortly after to normal sinus rhythm At least one additional episode of atrial fibrillation since then converted from afib to sinus, she had a 7.2 second pause, she was asymptomatic.  She was started on antiarrhythmic therapy in order to prevent recurrent afib and subsequently recurrent post-termination pauses. Eliquis was also started.  Cardiac catheterization in 2015 showing no significant CAD She presents for routine followup of her atrial fibrillation  In follow-up today she presents with her son and a friend They report that her Memory declining, on aricept, maybe little better  They are most troubled by periodic episodes of profound weakness that take her significant amount of time to recover from Unclear if she is having episodes of atrial fibrillation causing hypotension, there worried about low blood pressures during these episodes When she has an episode pulse seems to go up, blood pressure goes down She feels heavy in the chest, has nausea Seems to "hit her" hard she has to sit down Sometimes after she takes extra flecainide, lays down, symptoms seem to resolve somewhat Episodes happening approximately every 3 weeks  Friends and family report episodes Jan12th March June July 5th  Retired 1 year ago  EKG on today's visit shows normal sinus rhythm with rate 61 bpm, left axis deviation  Other past medical history  in the hospital 10/21/2014 for chest pain.  Cardiac enzymes negative Laboratory work showing total cholesterol 129, LDL 59, HDL 55, normal renal function She was kept in the hospital for hypertension and rule out, asymptomatic bradycardia. Her amiodarone was initially held for bradycardia although she was asymptomatic. Heart rate was 45 bpm at rest  Echocardiogram 10/21/2014 shows ejection fraction 60-65%, mildly dilated left atrium, otherwise a normal study  In the middle of April 2015, she awoke in the middle of the night with recurrent tachypalps and chest burning.  she called EMS the following morning.  She was found to be in afib with rvr and was taken to [facility] for eval.  There, troponin was elevated.  Initial ECG showed subtle anterolateral ST elevation.  She was initially treated with IV dilt and oral BB with rate improvement and later conversion.  In the setting of NSTEMI, she underwent cath, which showed nonobs CAD and nl LV fxn. F/U echo corroborated nl LV fxn.   History of torn biceps of her right arm at the end of September 2014. She wore a splint for 6 weeks. Seen by Dr. Daylene Katayama in [place].    PMH:   has a past medical history of Allergic rhinitis; Asthma; Biceps tendon tear; Diverticulosis of colon; GERD (gastroesophageal reflux disease); Hammer toe of right foot; colonic polyps; breast cancer; Meniere's disease; NSTEMI (non-ST elevated myocardial infarction) ([place]); Osteoarthritis; PAF (paroxysmal atrial fibrillation) ([place]); and Sinus brady-tachy syndrome ([place]).  PSH:    Past Surgical History:  Procedure Laterality Date  . ABDOMINAL HYSTERECTOMY    . [facility]IAC [facility]IZATION  [facility]   [facility]  . [place]   2nd to left toe  . ESOPHAGOGASTRODUODENOSCOPY  10/1999   inflammation only   . FOREIGN BODY REMOVAL Right 01/02/2013   Procedure: FOREIGN BODY REMOVAL RIGHT INDEX FINGER;  Surgeon: Cammie Sickle., MD;  Location: [facility];  Service: Orthopedics;  Laterality: Right;  . HAMMER TOE  SURGERY  11/15   Dr Milinda Pointer  . MASTECTOMY  1984   left   . MASTECTOMY  1985   right  . PARTIAL HYSTERECTOMY  1966   w/ bladder tack  . ROTATOR CUFF REPAIR  10/09   right   . TOE SURGERY  2001/2002   2nd/3rd,left     Current Outpatient Prescriptions  Medication Sig Dispense Refill  . albuterol (PROVENTIL HFA) 108 (90 BASE) MCG/ACT inhaler Inhale 2 puffs into the lungs every 6 (six) hours as needed.      . cholecalciferol (VITAMIN D) 1000 units tablet Take 5,000 Units by mouth daily.    . cyanocobalamin 500 MCG tablet Take 500 mcg by mouth every other day.     . donepezil (ARICEPT) 5 MG tablet Take 1 tablet (5 mg total) by mouth at bedtime. 30 tablet 11  . estradiol (ESTRACE) 0.1 MG/GM vaginal cream Place 1 Applicatorful vaginally 2 (two) times a week. 42.5 g 5  . flecainide (TAMBOCOR) 50 MG tablet TAKE 1 TABLET (50 MG TOTAL) BY MOUTH 2 (TWO) TIMES DAILY. 180 tablet 3  . Fluticasone-Salmeterol (ADVAIR DISKUS) 100-50 MCG/DOSE AEPB Inhale 1 puff into the lungs every 12 (twelve) hours.      . montelukast (SINGULAIR) 10 MG tablet Take 10 mg by mouth daily.      . nitroGLYCERIN (NITROSTAT) 0.4 MG SL tablet Place 1 tablet (0.4 mg total) under the tongue every 5 (five) minutes as needed for chest pain. 25 tablet 0  . XARELTO 20 MG TABS tablet TAKE 1 TABLET BY MOUTH EVERY DAY WITH SUPPER 30 tablet 3   No current facility-administered medications for this visit.      Allergies:   Amiodarone and Other   Social History:  The patient  reports that she has never smoked. She has never used smokeless tobacco. She reports that she does not drink alcohol or use drugs.   Family History:   family history includes Arthritis in her brother and mother; Cancer in her maternal grandfather; Colon cancer in her maternal grandfather; Diabetes in her maternal grandmother; Gout in her mother; Heart disease in her father and mother; Heart failure in her father and mother.    Review of Systems: Review of Systems   Constitutional: Positive for malaise/fatigue.  Respiratory: Negative.   Cardiovascular: Negative.   Gastrointestinal: Negative.   Musculoskeletal: Negative.   Skin: Negative.   Neurological: Positive for weakness.  Psychiatric/Behavioral: Positive for memory loss.  All other systems reviewed and are negative.    PHYSICAL EXAM: VS:  BP 132/62 (BP Location: Left Arm, Patient Position: Sitting, Cuff Size: Normal)   Pulse 61   Ht 5\' 7"  (1.702 m)   Wt 137 lb 8 oz (62.4 kg)   BMI 21.54 kg/m  , BMI Body mass index is 21.54 kg/m. GEN: Well nourished, well developed, in no acute distress  HEENT: normal  Neck: no JVD, carotid bruits, or masses Cardiac: RRR; no murmurs, rubs, or gallops,no edema  Respiratory:  clear to auscultation bilaterally, normal work of breathing GI: soft, nontender, nondistended, + BS Leah: no deformity or atrophy  Skin: warm and dry, no rash Neuro:  Strength and sensation are intact Psych: euthymic mood, full affect    Recent Labs: 11/27/2016: ALT 11; BUN 20; Creatinine, Ser 0.93; Hemoglobin 13.4; Platelets 203.0; Potassium 4.6; Sodium 138    Lipid Panel Lab Results  Component Value Date   CHOL 196 06/14/2016   HDL 65.30 06/14/2016   LDLCALC 115 (H) 06/14/2016   TRIG 80.0 06/14/2016      Wt Readings from Last 3 Encounters:  03/07/17 137 lb 8 oz (62.4 kg)  02/21/17 139 lb (63 kg)  01/31/17 135 lb 8 oz (61.5 kg)       ASSESSMENT AND PLAN:  PAF (paroxysmal atrial fibrillation) ([place]) - Plan: EKG 12-Lead Family concerned that she is having paroxysmal episodes causing profound weakness Certainly possible this could be causing hypotension, tachycardia 30 day monitor has been ordered for clarification before we increase the dose of her flecainide or change to alternate antiarrhythmic.  SVT/ PSVT/ PAT Denies having any significant arrhythmia, no medication changes made  Atherosclerosis of native coronary artery of native heart with angina pectoris   ([employer]) She does report periodic chest tightness possibly in the setting of atrial fibrillation 30 day monitor as above  Memory loss:  Managed by primary care, started on Aricept Needing assistance at home Mild cognitive decline in the past year  Long discussion with family concerning various symptoms and workup available  Total encounter time more than 45 minutes  Greater than 50% was spent in counseling and coordination of care with the patient   Disposition:   F/U  12 months   Orders Placed This Encounter  Procedures  . EKG 12-Lead     Signed, Esmond Plants, M.D., Ph.D. 03/07/2017  [place], [place]

## 2017-03-07 ENCOUNTER — Ambulatory Visit (INDEPENDENT_AMBULATORY_CARE_PROVIDER_SITE_OTHER): Payer: PPO | Admitting: Cardiovascular Disease

## 2017-03-07 ENCOUNTER — Encounter: Payer: Self-pay | Admitting: Cardiovascular Disease

## 2017-03-07 VITALS — BP 132/62 | HR 61 | Ht 67.0 in | Wt 137.5 lb

## 2017-03-07 DIAGNOSIS — I480 Paroxysmal atrial fibrillation: Secondary | ICD-10-CM | POA: Diagnosis not present

## 2017-03-07 DIAGNOSIS — I471 Supraventricular tachycardia: Secondary | ICD-10-CM

## 2017-03-07 DIAGNOSIS — E782 Mixed hyperlipidemia: Secondary | ICD-10-CM | POA: Diagnosis not present

## 2017-03-07 DIAGNOSIS — I495 Sick sinus syndrome: Secondary | ICD-10-CM

## 2017-03-07 NOTE — Patient Instructions (Addendum)
Medication Instructions:   No medication changes made  Labwork:  No new labs needed  Testing/Procedures:  We will order  A 30 day monitor for atrial fibrillation, chest heaviness, weakness You will receive a call from [employer] to verify your address before they mail the monitor to you.  It is very important that you answer this call.  Once you receive the monitor, please call the 1-800 # located on the box.  You will be provided w/ instruction on how to apply, as well as activate the monitor.   Follow-Up: It was a pleasure seeing you in the office today. Please call us if you have new issues that need to be addressed before your next appt.  [phone]  Your physician wants you to follow-up in:  Please call with any more episodes We will call you with the results of the 30 day monitor  If you need a refill on your cardiac medications before your next appointment, please call your pharmacy.     Cardiac Event Monitoring A cardiac event monitor is a small recording device that is used to detect abnormal heart rhythms (arrhythmias). The monitor is used to record your heart rhythm when you have symptoms, such as:  Fast heartbeats (palpitations), such as heart racing or fluttering.  Dizziness.  Fainting or light-headedness.  Unexplained weakness.  Some monitors are wired to electrodes placed on your chest. Electrodes are flat, sticky disks that attach to your skin. Other monitors may be hand-held or worn on the wrist. The monitor can be worn for up to 30 days. If the monitor is attached to your chest, a technician will prepare your chest for the electrode placement and show you how to work the monitor. Take time to practice using the monitor before you leave the office. Make sure you understand how to send the information from the monitor to your health care provider. In some cases, you may need to use a landline telephone instead of a cell phone. What are the  risks? Generally, this device is safe to use, but it possible that the skin under the electrodes will become irritated. How to use your cardiac event monitor  Wear your monitor at all times, except when you are in water: ? Do not let the monitor get wet. ? Take the monitor off when you bathe. Do not swim or use a hot tub with it on.  Keep your skin clean. Do not put body lotion or moisturizer on your chest.  Change the electrodes as told by your health care provider or any time they stop sticking to your skin. You may need to use medical tape to keep them on.  Try to put the electrodes in slightly different places on your chest to help prevent skin irritation. They must remain in the area under your left breast and in the upper right section of your chest.  Make sure the monitor is safely clipped to your clothing or in a location close to your body that your health care provider recommends.  Press the button to record as soon as you feel heart-related symptoms, such as: ? Dizziness. ? Weakness. ? Light-headedness. ? Palpitations. ? Thumping or pounding in your chest. ? Shortness of breath. ? Unexplained weakness.  Keep a diary of your activities, such as walking, doing chores, and taking medicine. It is very important to note what you were doing when you pushed the button to record your symptoms. This will help your health care provider determine what might  be contributing to your symptoms.  Send the recorded information as recommended by your health care provider. It may take some time for your health care provider to process the results.  Change the batteries as told by your health care provider.  Keep electronic devices away from your monitor. This includes: ? Tablets. ? MP3 players. ? Cell phones.  While wearing your monitor you should avoid: ? Electric blankets. ? Armed forces operational officer. ? Electric toothbrushes. ? Microwave ovens. ? Magnets. ? Metal detectors. Get help right  away if:  You have chest pain.  You have extreme difficulty breathing or shortness of breath.  You develop a very fast heartbeat that persists.  You develop dizziness that does not go away.  You faint or constantly feel like you are about to faint. Summary  A cardiac event monitor is a small recording device that is used to help detect abnormal heart rhythms (arrhythmias).  The monitor is used to record your heart rhythm when you have heart-related symptoms.  Make sure you understand how to send the information from the monitor to your health care provider.  It is important to press the button on the monitor when you have any heart-related symptoms.  Keep a diary of your activities, such as walking, doing chores, and taking medicine. It is very important to note what you were doing when you pushed the button to record your symptoms. This will help your health care provider learn what might be causing your symptoms. This information is not intended to replace advice given to you by your health care provider. Make sure you discuss any questions you have with your health care provider. Document Released: 05/09/2008 Document Revised: 07/15/2016 Document Reviewed: 07/15/2016 Elsevier Interactive Patient Education  2017 [employer].

## 2017-03-12 ENCOUNTER — Encounter (INDEPENDENT_AMBULATORY_CARE_PROVIDER_SITE_OTHER): Payer: PPO

## 2017-03-12 DIAGNOSIS — I495 Sick sinus syndrome: Secondary | ICD-10-CM

## 2017-03-12 DIAGNOSIS — E782 Mixed hyperlipidemia: Secondary | ICD-10-CM | POA: Diagnosis not present

## 2017-03-12 DIAGNOSIS — I4891 Unspecified atrial fibrillation: Secondary | ICD-10-CM | POA: Diagnosis not present

## 2017-03-12 DIAGNOSIS — I471 Supraventricular tachycardia: Secondary | ICD-10-CM | POA: Diagnosis not present

## 2017-03-12 DIAGNOSIS — I480 Paroxysmal atrial fibrillation: Secondary | ICD-10-CM

## 2017-03-12 DIAGNOSIS — R531 Weakness: Secondary | ICD-10-CM | POA: Diagnosis not present

## 2017-03-27 ENCOUNTER — Ambulatory Visit: Payer: PPO | Admitting: Internal Medicine

## 2017-04-05 ENCOUNTER — Ambulatory Visit: Payer: PPO | Admitting: Cardiovascular Disease

## 2017-04-17 DIAGNOSIS — D0471 Carcinoma in situ of skin of right lower limb, including hip: Secondary | ICD-10-CM | POA: Diagnosis not present

## 2017-04-17 DIAGNOSIS — D485 Neoplasm of uncertain behavior of skin: Secondary | ICD-10-CM | POA: Diagnosis not present

## 2017-04-17 DIAGNOSIS — L570 Actinic keratosis: Secondary | ICD-10-CM | POA: Diagnosis not present

## 2017-04-17 DIAGNOSIS — D1801 Hemangioma of skin and subcutaneous tissue: Secondary | ICD-10-CM | POA: Diagnosis not present

## 2017-04-17 DIAGNOSIS — X32XXXA Exposure to sunlight, initial encounter: Secondary | ICD-10-CM | POA: Diagnosis not present

## 2017-04-17 DIAGNOSIS — I872 Venous insufficiency (chronic) (peripheral): Secondary | ICD-10-CM | POA: Diagnosis not present

## 2017-04-24 ENCOUNTER — Telehealth: Payer: Self-pay | Admitting: Cardiovascular Disease

## 2017-04-24 NOTE — Telephone Encounter (Signed)
Patient daughter in law would like patient seen sooner.  She was offered a sooner appt with APP staff but declined. Added to wait list for Dr. Rockey Situ.

## 2017-05-07 DIAGNOSIS — D0471 Carcinoma in situ of skin of right lower limb, including hip: Secondary | ICD-10-CM | POA: Diagnosis not present

## 2017-05-10 ENCOUNTER — Telehealth: Payer: Self-pay | Admitting: Cardiovascular Disease

## 2017-05-10 NOTE — Telephone Encounter (Signed)
Patient called to schedule for a sooner appt   Declined to come in today

## 2017-05-31 DIAGNOSIS — H01009 Unspecified blepharitis unspecified eye, unspecified eyelid: Secondary | ICD-10-CM | POA: Diagnosis not present

## 2017-05-31 DIAGNOSIS — M3501 Sicca syndrome with keratoconjunctivitis: Secondary | ICD-10-CM | POA: Diagnosis not present

## 2017-05-31 DIAGNOSIS — H04123 Dry eye syndrome of bilateral lacrimal glands: Secondary | ICD-10-CM | POA: Diagnosis not present

## 2017-06-01 NOTE — Progress Notes (Signed)
Cardiology Office Note  Date:  06/04/2017   ID:  Leah Hayden, DOB 10-28-1927, MRN [id number]  PCP:  Venia Carbon, MD   Chief Complaint  Patient presents with  . other    F/u holter c/o back pain. Meds reviewed verbally with pt.    HPI:  Leah Hayden is a very pleasant 83 year old woman with a history of  SVT, pauses on Holter,  admitted to the hospital August 2012 with atrial fibrillation  converted shortly after to normal sinus rhythm At least one additional episode of atrial fibrillation since then converted from afib to sinus, she had a 7.2 second pause, she was asymptomatic.  She was started on antiarrhythmic therapy in order to prevent recurrent afib and subsequently recurrent post-termination pauses. Eliquis was also started.  Cardiac catheterization in 2015 showing no significant CAD She presents for routine followup of her atrial fibrillation  In follow-up today she presents with neighbors and friends Memory declining, on aricept,  Reports she is stable  Lives alone, denies any medication confusion  Rarely continues to have weak spells,  Does not know what they are, seems to acutely get weak needs to sit down Previous 30-day monitor to capture a week spell with no arrhythmia No significant atrial fibrillation on 30-day monitor through August 2018  Eye problem on the left Has seen eye specialist  Previously for atrial fibrillation was having fluttering and nausea, none recently Denies any anginal symptoms  Previous weak spells Jan12th March June July 5th  Retired from [employer]  EKG on today's visit shows normal sinus rhythm with rate 64 bpm, left axis deviation, T wave abnormality 3, aVF, V1 through V4, noted on previous EKG  Other past medical history  in the hospital 10/21/2014 for chest pain. Cardiac enzymes negative Laboratory work showing total cholesterol 129, LDL 59, HDL 55, normal renal function She was kept in the hospital for hypertension and  rule out, asymptomatic bradycardia. Her amiodarone was initially held for bradycardia although she was asymptomatic. Heart rate was 45 bpm at rest  Echocardiogram 10/21/2014 shows ejection fraction 60-65%, mildly dilated left atrium, otherwise a normal study  In the middle of April 2015, she awoke in the middle of the night with recurrent tachypalps and chest burning.  she called EMS the following morning.  She was found to be in afib with rvr and was taken to [facility] for eval.  There, troponin was elevated.  Initial ECG showed subtle anterolateral ST elevation.  She was initially treated with IV dilt and oral BB with rate improvement and later conversion.  In the setting of NSTEMI, she underwent cath, which showed nonobs CAD and nl LV fxn. F/U echo corroborated nl LV fxn.   History of torn biceps of her right arm at the end of September 2014. She wore a splint for 6 weeks. Seen by Dr. Daylene Katayama in [place].    PMH:   has a past medical history of Allergic rhinitis; Asthma; Biceps tendon tear; Diverticulosis of colon; GERD (gastroesophageal reflux disease); Hammer toe of right foot; colonic polyps; breast cancer; Meniere's disease; NSTEMI (non-ST elevated myocardial infarction) ([place]); Osteoarthritis; PAF (paroxysmal atrial fibrillation) ([place]); and Sinus brady-tachy syndrome ([place]).  PSH:    Past Surgical History:  Procedure Laterality Date  . ABDOMINAL HYSTERECTOMY    . CARDIAC CATHETERIZATION  11/2013   [facility]  . [place]   2nd to left toe  . ESOPHAGOGASTRODUODENOSCOPY  10/1999   inflammation only   . FOREIGN BODY  REMOVAL Right 01/02/2013   Procedure: FOREIGN BODY REMOVAL RIGHT INDEX FINGER;  Surgeon: Cammie Sickle., MD;  Location: [facility];  Service: Orthopedics;  Laterality: Right;  . HAMMER TOE SURGERY  11/15   Dr Milinda Pointer  . MASTECTOMY  1984   left   . MASTECTOMY  1985   right  . PARTIAL HYSTERECTOMY  1966   w/ bladder tack  . ROTATOR CUFF REPAIR   10/09   right   . TOE SURGERY  2001/2002   2nd/3rd,left     Current Outpatient Prescriptions  Medication Sig Dispense Refill  . albuterol (PROVENTIL HFA) 108 (90 BASE) MCG/ACT inhaler Inhale 2 puffs into the lungs every 6 (six) hours as needed.      . cholecalciferol (VITAMIN D) 1000 units tablet Take 5,000 Units by mouth daily.    . cyanocobalamin 500 MCG tablet Take 500 mcg by mouth every other day.     . donepezil (ARICEPT) 5 MG tablet Take 1 tablet (5 mg total) by mouth at bedtime. 30 tablet 11  . estradiol (ESTRACE) 0.1 MG/GM vaginal cream Place 1 Applicatorful vaginally 2 (two) times a week. 42.5 g 5  . flecainide (TAMBOCOR) 50 MG tablet TAKE 1 TABLET (50 MG TOTAL) BY MOUTH 2 (TWO) TIMES DAILY. 180 tablet 3  . fluorometholone (FML) 0.1 % ophthalmic suspension Place 1 drop into both eyes 4 (four) times daily.     . Fluticasone-Salmeterol (ADVAIR DISKUS) 100-50 MCG/DOSE AEPB Inhale 1 puff into the lungs every 12 (twelve) hours.      . montelukast (SINGULAIR) 10 MG tablet Take 10 mg by mouth daily.      . nitroGLYCERIN (NITROSTAT) 0.4 MG SL tablet Place 1 tablet (0.4 mg total) under the tongue every 5 (five) minutes as needed for chest pain. 25 tablet 1  . XARELTO 20 MG TABS tablet TAKE 1 TABLET BY MOUTH EVERY DAY WITH SUPPER 30 tablet 3   No current facility-administered medications for this visit.      Allergies:   Amiodarone and Other   Social History:  The patient  reports that she has never smoked. She has never used smokeless tobacco. She reports that she does not drink alcohol or use drugs.   Family History:   family history includes Arthritis in her brother and mother; Cancer in her maternal grandfather; Colon cancer in her maternal grandfather; Diabetes in her maternal grandmother; Gout in her mother; Heart disease in her father and mother; Heart failure in her father and mother.    Review of Systems: Review of Systems  Constitutional: Negative.   Respiratory: Negative.    Cardiovascular: Negative.   Gastrointestinal: Negative.   Musculoskeletal: Negative.   Skin: Negative.   Psychiatric/Behavioral: Positive for memory loss.  All other systems reviewed and are negative.    PHYSICAL EXAM: VS:  BP 138/68 (BP Location: Left Arm, Patient Position: Sitting, Cuff Size: Normal)   Pulse 64   Ht 5\' 8"  (1.727 m)   Wt 136 lb 8 oz (61.9 kg)   BMI 20.75 kg/m  , BMI Body mass index is 20.75 kg/m.  No significant change compared to previous office visit  GEN: Well nourished, well developed, in no acute distress  HEENT: normal  Neck: no JVD, carotid bruits, or masses Cardiac: RRR; no murmurs, rubs, or gallops,no edema  Respiratory:  clear to auscultation bilaterally, normal work of breathing GI: soft, nontender, nondistended, + BS Leah: no deformity or atrophy  Skin: warm and dry, no rash  Neuro:  Strength and sensation are intact Psych: euthymic mood, full affect    Recent Labs: 11/27/2016: ALT 11; BUN 20; Creatinine, Ser 0.93; Hemoglobin 13.4; Platelets 203.0; Potassium 4.6; Sodium 138    Lipid Panel Lab Results  Component Value Date   CHOL 196 06/14/2016   HDL 65.30 06/14/2016   LDLCALC 115 (H) 06/14/2016   TRIG 80.0 06/14/2016      Wt Readings from Last 3 Encounters:  06/04/17 136 lb 8 oz (61.9 kg)  03/07/17 137 lb 8 oz (62.4 kg)  02/21/17 139 lb (63 kg)       ASSESSMENT AND PLAN:  PAF (paroxysmal atrial fibrillation) (HCC) - Plan: EKG 12-Lead 30-day monitor as detailed above with no significant arrhythmia No medications changes made  SVT/ PSVT/ PAT Denies having any significant arrhythmia, no medication changes made Continue current medications  Atherosclerosis of native coronary artery of native heart with angina pectoris  (HCC) Previous chest tightness, none recently She is not requiring nitro Previous cardiac catheterization with no significant disease  Memory loss:  Managed by primary care, started on Aricept Needing  assistance at home Mild cognitive decline Recommended regular walking program  Long discussion with family concerning various symptoms and workup available  Total encounter time more than 25 minutes  Greater than 50% was spent in counseling and coordination of care with the patient   Disposition:   F/U  12 months   Orders Placed This Encounter  Procedures  . EKG 12-Lead     Signed, Esmond Plants, M.D., Ph.D. 06/04/2017  [place], [place]

## 2017-06-04 ENCOUNTER — Ambulatory Visit (INDEPENDENT_AMBULATORY_CARE_PROVIDER_SITE_OTHER): Payer: PPO | Admitting: Cardiovascular Disease

## 2017-06-04 ENCOUNTER — Encounter: Payer: Self-pay | Admitting: Cardiovascular Disease

## 2017-06-04 VITALS — BP 138/68 | HR 64 | Ht 68.0 in | Wt 136.5 lb

## 2017-06-04 DIAGNOSIS — I495 Sick sinus syndrome: Secondary | ICD-10-CM

## 2017-06-04 DIAGNOSIS — Z23 Encounter for immunization: Secondary | ICD-10-CM

## 2017-06-04 DIAGNOSIS — I471 Supraventricular tachycardia: Secondary | ICD-10-CM

## 2017-06-04 DIAGNOSIS — E782 Mixed hyperlipidemia: Secondary | ICD-10-CM

## 2017-06-04 DIAGNOSIS — I480 Paroxysmal atrial fibrillation: Secondary | ICD-10-CM | POA: Diagnosis not present

## 2017-06-04 MED ORDER — NITROGLYCERIN 0.4 MG SL SUBL: 0.4000 mg | SUBLINGUAL_TABLET | SUBLINGUAL | 1 refills | Status: DC | PRN

## 2017-06-04 NOTE — Patient Instructions (Addendum)

## 2017-06-12 ENCOUNTER — Telehealth: Payer: Self-pay | Admitting: Cardiovascular Disease

## 2017-06-12 NOTE — Telephone Encounter (Signed)
Spoke w/ Hassan Rowan, pt's daughter-in-law. She states that pt wore 30 day [employer] monitor in July and they received a bill stating that it was not prior authorized. She would like to know how to rectify this and get pt's insurance to cover it. Advised her that I spoke w/ our pre-cert dept and that the company should have taken care of this.  Spoke w/ Mitch @ [employer].  He states that he is almost certain that pt's ins will cover this and she will owe almost nothing. He will check on this and call me back.

## 2017-06-12 NOTE — Telephone Encounter (Signed)
Pt's daughter in law is calling, states pt monitor needs prior authorization. Please call.

## 2017-06-13 NOTE — Telephone Encounter (Signed)
Spoke w/ Karn Pickler.  He states that pt's bill has not been completely processed yet and that it looks like they only had 1 of pt's ins on file. He asks that I email pt's ins cards to him to verify her info and get this processed. Spoke w/ Hassan Rowan and advised her to throw away the denial letter that she got and that pt's bill will be almost nothing.  She is appreciative of the call.   Emailed pt's ins cards to [email or web address].

## 2017-06-15 ENCOUNTER — Encounter: Payer: Self-pay | Admitting: Internal Medicine

## 2017-06-15 ENCOUNTER — Ambulatory Visit (INDEPENDENT_AMBULATORY_CARE_PROVIDER_SITE_OTHER): Payer: PPO | Admitting: Internal Medicine

## 2017-06-15 VITALS — BP 110/82 | HR 60 | Temp 98.1°F | Ht 65.5 in | Wt 134.0 lb

## 2017-06-15 DIAGNOSIS — I480 Paroxysmal atrial fibrillation: Secondary | ICD-10-CM

## 2017-06-15 DIAGNOSIS — F0150 Vascular dementia without behavioral disturbance: Secondary | ICD-10-CM | POA: Diagnosis not present

## 2017-06-15 DIAGNOSIS — Z0000 Encounter for general adult medical examination without abnormal findings: Secondary | ICD-10-CM | POA: Diagnosis not present

## 2017-06-15 DIAGNOSIS — E441 Mild protein-calorie malnutrition: Secondary | ICD-10-CM | POA: Diagnosis not present

## 2017-06-15 DIAGNOSIS — Z7189 Other specified counseling: Secondary | ICD-10-CM | POA: Diagnosis not present

## 2017-06-15 NOTE — Progress Notes (Signed)
Hearing Screening   Method: Audiometry   125Hz  250Hz  500Hz  1000Hz  2000Hz  3000Hz  4000Hz  6000Hz  8000Hz   Right ear:   40 0 40  0    Left ear:   20 40 0  0    Vision Screening Comments: October 2018

## 2017-06-15 NOTE — Progress Notes (Signed)
Subjective:    Patient ID: Leah Hayden, female    DOB: 01-14-1928, 83 y.o.   MRN: [id number]  HPI Here for Medicare wellness visit and follow up of chronic health conditions With daughter-in-law Reviewed form and advanced directives Reviewed other doctors Not depressed or anhedonic. Going to church again, out to eat with family and occasionally friends No alcohol and no tobacco Vision is okay-some better after recent steroid drops (distance is good). Mostly just dry eye Hearing is not good No falls  Has been on the donepezil  Seems to be tolerating Does have some constipation/diarrhea---not new though She and daughter notice a little improvement Still has trouble scheduling medications--daughter makes her a calendar Still drives locally --hasn't gotten lost Doing all instrumental ADLs still --- but DIL cooking for her mostly Generally continent in day--but trouble with urinary incontinence at night Does have alert button  No trouble with heart lately Has had palpitations in past--but not recently No chest pian No SOB other than DOE with steps or fast walking No syncope. slight dizziness if she turns too fast  Appetite is okay Maintaining weight now   Current Outpatient Prescriptions on File Prior to Visit  Medication Sig Dispense Refill  . albuterol (PROVENTIL HFA) 108 (90 BASE) MCG/ACT inhaler Inhale 2 puffs into the lungs every 6 (six) hours as needed.      . cholecalciferol (VITAMIN D) 1000 units tablet Take 5,000 Units by mouth daily.    . cyanocobalamin 500 MCG tablet Take 500 mcg by mouth every other day.     . donepezil (ARICEPT) 5 MG tablet Take 1 tablet (5 mg total) by mouth at bedtime. 30 tablet 11  . estradiol (ESTRACE) 0.1 MG/GM vaginal cream Place 1 Applicatorful vaginally 2 (two) times a week. 42.5 g 5  . flecainide (TAMBOCOR) 50 MG tablet TAKE 1 TABLET (50 MG TOTAL) BY MOUTH 2 (TWO) TIMES DAILY. 180 tablet 3  . fluorometholone (FML) 0.1 % ophthalmic  suspension Place 1 drop into both eyes 4 (four) times daily.     . Fluticasone-Salmeterol (ADVAIR DISKUS) 100-50 MCG/DOSE AEPB Inhale 1 puff into the lungs every 12 (twelve) hours.      . montelukast (SINGULAIR) 10 MG tablet Take 10 mg by mouth daily.      . nitroGLYCERIN (NITROSTAT) 0.4 MG SL tablet Place 1 tablet (0.4 mg total) under the tongue every 5 (five) minutes as needed for chest pain. 25 tablet 1  . XARELTO 20 MG TABS tablet TAKE 1 TABLET BY MOUTH EVERY DAY WITH SUPPER 30 tablet 3   No current facility-administered medications on file prior to visit.     Allergies  Allergen Reactions  . Amiodarone Other (See Comments)    Chest discomfort  . Other Rash    oramycin    Past Medical History:  Diagnosis Date  . Allergic rhinitis   . Asthma   . Biceps tendon tear    a. right->s/p surgical repair winter of 2014.  . Diverticulosis of colon   . GERD (gastroesophageal reflux disease)   . Hammer toe of right foot   . Hx of colonic polyps   . HX: breast cancer   . Meniere's disease   . NSTEMI (non-ST elevated myocardial infarction) ([place])    a. 11/2012 in setting of rapid afib;  b. 11/2012 Cath: nl EF, nonobs CAD->Med Rx;  b. 11/2012 Echo: EF 60-65%, mild LVH, mild MR/TR.  . Osteoarthritis   . PAF (paroxysmal atrial fibrillation) ([place])  a. Eliquis and amio initiated 11/2012 in setting of admission with RVR and NSTEMI;  b. Subsequently changed to flecainide and xarelto;  c. 11/2013 Echo: EF 60-65%, mild LVH, mold MR/TR.  Marland Kitchen Sinus brady-tachy syndrome ([place])    a. Post-conversion pause of 7.2 seconds during 11/2012 hospitalization @ [facility]    Past Surgical History:  Procedure Laterality Date  . ABDOMINAL HYSTERECTOMY    . CARDIAC CATHETERIZATION  11/2013   [facility]  . [place]   2nd to left toe  . ESOPHAGOGASTRODUODENOSCOPY  10/1999   inflammation only   . FOREIGN BODY REMOVAL Right 01/02/2013   Procedure: FOREIGN BODY REMOVAL RIGHT INDEX FINGER;  Surgeon: Cammie Sickle., MD;  Location: [facility];  Service: Orthopedics;  Laterality: Right;  . HAMMER TOE SURGERY  11/15   Dr Milinda Pointer  . MASTECTOMY  1984   left   . MASTECTOMY  1985   right  . PARTIAL HYSTERECTOMY  1966   w/ bladder tack  . ROTATOR CUFF REPAIR  10/09   right   . TOE SURGERY  2001/2002   2nd/3rd,left     Family History  Problem Relation Age of Onset  . Heart failure Mother   . Gout Mother   . Heart disease Mother        heart failure  . Arthritis Mother   . Heart failure Father   . Heart disease Father        heart failure  . Diabetes Maternal Grandmother   . Colon cancer Maternal Grandfather   . Cancer Maternal Grandfather        colon  . Arthritis Brother     Social History   Social History  . Marital status: Widowed    Spouse name: N/A  . Number of children: 2  . Years of education: N/A   Occupational History  . Lab Scientist, research (life sciences)   Social History Main Topics  . Smoking status: Never Smoker  . Smokeless tobacco: Never Used  . Alcohol use No  . Drug use: No  . Sexual activity: Not on file   Other Topics Concern  . Not on file   Social History Narrative   Now has living will   Sons are health care POAs   Has DNR   No tube feeds if cognitively unaware   Review of Systems Sleeps okay Bowels are the same. No blood Wears seat belt Teeth okay--keeps up with dentist Mild low back pain and stiffness. No meds Gets indigestion at times--- goes away on its own. Not daily. No dysphagia Ongoing follow up with derm. Some drainage at last treatment spot (6 weeks ago) Also had 5-FU on face--this is better    Objective:   Physical Exam  Constitutional: She is oriented to person, place, and time. No distress.  HENT:  Mouth/Throat: Oropharynx is clear and moist. No oropharyngeal exudate.  Neck: No thyromegaly present.  Cardiovascular: Normal rate, regular rhythm, normal heart sounds and intact distal pulses.  Exam  reveals no gallop.   No murmur heard. Pulmonary/Chest: Effort normal and breath sounds normal. No respiratory distress. She has no wheezes. She has no rales.  Abdominal: Soft. She exhibits no distension. There is no tenderness. There is no rebound and no guarding.  Musculoskeletal: She exhibits no edema or tenderness.  Lymphadenopathy:    She has no cervical adenopathy.  Neurological: She is alert and oriented to person, place, and time.  President-- "Daisy Floro, Obama, Bush" [phone]  Couldn't do it backwards Recall 3/3  Skin: No rash noted. No erythema.  Psychiatric: She has a normal mood and affect. Her behavior is normal.          Assessment & Plan:

## 2017-06-15 NOTE — Assessment & Plan Note (Signed)
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

## 2017-06-15 NOTE — Assessment & Plan Note (Signed)
Weight has stabilized  Has slacked off on supplements--discussed

## 2017-06-15 NOTE — Assessment & Plan Note (Signed)
I have personally reviewed the Medicare Annual Wellness questionnaire and have noted 1. The patient's medical and social history 2. Their use of alcohol, tobacco or illicit drugs 3. Their current medications and supplements 4. The patient's functional ability including ADL's, fall risks, home safety risks and hearing or visual             impairment. 5. Diet and physical activities 6. Evidence for depression or mood disorders  The patients weight, height, BMI and visual acuity have been recorded in the chart I have made referrals, counseling and provided education to the patient based review of the above and I have provided the pt with a written personalized care plan for preventive services.  I have provided you with a copy of your personalized plan for preventive services. Please take the time to review along with your updated medication list.  No cancer screening Discussed regular exercise, adequate diet, etc Had flu vaccine already

## 2017-06-15 NOTE — Assessment & Plan Note (Signed)
Has DNR 

## 2017-06-15 NOTE — Assessment & Plan Note (Signed)
Rate fine On xarelto

## 2017-07-07 ENCOUNTER — Other Ambulatory Visit: Payer: Self-pay | Admitting: Cardiovascular Disease

## 2017-07-09 NOTE — Telephone Encounter (Signed)
Please review for refill, Thanks !  

## 2017-07-12 DIAGNOSIS — M3501 Sicca syndrome with keratoconjunctivitis: Secondary | ICD-10-CM | POA: Diagnosis not present

## 2017-07-12 DIAGNOSIS — H01009 Unspecified blepharitis unspecified eye, unspecified eyelid: Secondary | ICD-10-CM | POA: Diagnosis not present

## 2017-07-12 DIAGNOSIS — H04123 Dry eye syndrome of bilateral lacrimal glands: Secondary | ICD-10-CM | POA: Diagnosis not present

## 2017-09-05 ENCOUNTER — Other Ambulatory Visit: Payer: Self-pay | Admitting: Cardiovascular Disease

## 2017-10-29 DIAGNOSIS — M3501 Sicca syndrome with keratoconjunctivitis: Secondary | ICD-10-CM | POA: Diagnosis not present

## 2017-10-29 DIAGNOSIS — H04123 Dry eye syndrome of bilateral lacrimal glands: Secondary | ICD-10-CM | POA: Diagnosis not present

## 2017-11-04 ENCOUNTER — Other Ambulatory Visit: Payer: Self-pay | Admitting: Cardiovascular Disease

## 2017-11-05 DIAGNOSIS — D2262 Melanocytic nevi of left upper limb, including shoulder: Secondary | ICD-10-CM | POA: Diagnosis not present

## 2017-11-05 DIAGNOSIS — D225 Melanocytic nevi of trunk: Secondary | ICD-10-CM | POA: Diagnosis not present

## 2017-11-05 DIAGNOSIS — Z85828 Personal history of other malignant neoplasm of skin: Secondary | ICD-10-CM | POA: Diagnosis not present

## 2017-11-05 DIAGNOSIS — D2261 Melanocytic nevi of right upper limb, including shoulder: Secondary | ICD-10-CM | POA: Diagnosis not present

## 2017-12-12 ENCOUNTER — Ambulatory Visit (INDEPENDENT_AMBULATORY_CARE_PROVIDER_SITE_OTHER): Payer: PPO | Admitting: Internal Medicine

## 2017-12-12 ENCOUNTER — Encounter: Payer: Self-pay | Admitting: Internal Medicine

## 2017-12-12 VITALS — BP 122/70 | HR 65 | Temp 98.2°F | Ht 66.0 in | Wt 128.0 lb

## 2017-12-12 DIAGNOSIS — F0150 Vascular dementia without behavioral disturbance: Secondary | ICD-10-CM

## 2017-12-12 DIAGNOSIS — I480 Paroxysmal atrial fibrillation: Secondary | ICD-10-CM

## 2017-12-12 DIAGNOSIS — I471 Supraventricular tachycardia, unspecified: Secondary | ICD-10-CM

## 2017-12-12 DIAGNOSIS — E441 Mild protein-calorie malnutrition: Secondary | ICD-10-CM

## 2017-12-12 DIAGNOSIS — Z23 Encounter for immunization: Secondary | ICD-10-CM | POA: Diagnosis not present

## 2017-12-12 NOTE — Assessment & Plan Note (Signed)
Seems stable No functional decline--home alone with family help On xarelto still

## 2017-12-12 NOTE — Assessment & Plan Note (Signed)
No symptomatic recurrence Recent 30 day monitor for this and a fib reportedly benign

## 2017-12-12 NOTE — Progress Notes (Signed)
Subjective:    Patient ID: Leah Hayden, female    DOB: 01-15-1928, 84 y.o.   MRN: [id number]  HPI Here with DIL for follow up of dementia and other conditions  Getting along okay DIL shops for her and fixes some food Uses the equate supplement Eats well when she goes out Still drives local Handles the housework and piddles in the garden  No palpitations No chest pain No dizziness or syncope No edema  Cut right arm pruning a woody plant about 2 weeks ago Hasn't healed up yet  Current Outpatient Medications on File Prior to Visit  Medication Sig Dispense Refill  . albuterol (PROVENTIL HFA) 108 (90 BASE) MCG/ACT inhaler Inhale 2 puffs into the lungs every 6 (six) hours as needed.      . cholecalciferol (VITAMIN D) 1000 units tablet Take 5,000 Units by mouth daily.    . cyanocobalamin 500 MCG tablet Take 500 mcg by mouth every other day.     . donepezil (ARICEPT) 5 MG tablet Take 1 tablet (5 mg total) by mouth at bedtime. 30 tablet 11  . estradiol (ESTRACE) 0.1 MG/GM vaginal cream Place 1 Applicatorful vaginally 2 (two) times a week. 42.5 g 5  . flecainide (TAMBOCOR) 50 MG tablet TAKE 1 TABLET (50 MG TOTAL) BY MOUTH 2 (TWO) TIMES DAILY. 180 tablet 3  . Fluticasone-Salmeterol (ADVAIR DISKUS) 100-50 MCG/DOSE AEPB Inhale 1 puff into the lungs every 12 (twelve) hours.      . Ketotifen Fumarate (REFRESH EYE ITCH RELIEF OP) Apply to eye.    . montelukast (SINGULAIR) 10 MG tablet Take 10 mg by mouth daily.      . nitroGLYCERIN (NITROSTAT) 0.4 MG SL tablet Place 1 tablet (0.4 mg total) under the tongue every 5 (five) minutes as needed for chest pain. 25 tablet 1  . XARELTO 20 MG TABS tablet TAKE 1 TABLET BY MOUTH EVERY DAY WITH SUPPER 30 tablet 3   No current facility-administered medications on file prior to visit.     Allergies  Allergen Reactions  . Amiodarone Other (See Comments)    Chest discomfort  . Other Rash    oramycin    Past Medical History:  Diagnosis Date  .  Allergic rhinitis   . Asthma   . Biceps tendon tear    a. right->s/p surgical repair winter of 2014.  . Diverticulosis of colon   . GERD (gastroesophageal reflux disease)   . Hammer toe of right foot   . Hx of colonic polyps   . HX: breast cancer   . Meniere's disease   . NSTEMI (non-ST elevated myocardial infarction) ([place])    a. 11/2012 in setting of rapid afib;  b. 11/2012 Cath: nl EF, nonobs CAD->Med Rx;  b. 11/2012 Echo: EF 60-65%, mild LVH, mild MR/TR.  . Osteoarthritis   . PAF (paroxysmal atrial fibrillation) ([place])    a. Eliquis and amio initiated 11/2012 in setting of admission with RVR and NSTEMI;  b. Subsequently changed to flecainide and xarelto;  c. 11/2013 Echo: EF 60-65%, mild LVH, mold MR/TR.  Marland Kitchen Sinus brady-tachy syndrome ([place])    a. Post-conversion pause of 7.2 seconds during 11/2012 hospitalization @ [facility].    Past Surgical History:  Procedure Laterality Date  . ABDOMINAL HYSTERECTOMY    . CARDIAC CATHETERIZATION  11/2013   [facility]  . [place]   2nd to left toe  . ESOPHAGOGASTRODUODENOSCOPY  10/1999   inflammation only   . FOREIGN BODY REMOVAL Right  01/02/2013   Procedure: FOREIGN BODY REMOVAL RIGHT INDEX FINGER;  Surgeon: Cammie Sickle., MD;  Location: [place];  Service: Orthopedics;  Laterality: Right;  . HAMMER TOE SURGERY  11/15   Dr Milinda Pointer  . MASTECTOMY  1984   left   . MASTECTOMY  1985   right  . PARTIAL HYSTERECTOMY  1966   w/ bladder tack  . ROTATOR CUFF REPAIR  10/09   right   . TOE SURGERY  2001/2002   2nd/3rd,left     Family History  Problem Relation Age of Onset  . Heart failure Mother   . Gout Mother   . Heart disease Mother        heart failure  . Arthritis Mother   . Heart failure Father   . Heart disease Father        heart failure  . Diabetes Maternal Grandmother   . Colon cancer Maternal Grandfather   . Cancer Maternal Grandfather        colon  . Arthritis Brother     Social History    Socioeconomic History  . Marital status: Widowed    Spouse name: Not on file  . Number of children: 2  . Years of education: Not on file  . Highest education level: Not on file  Occupational History  . 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 Financial resource strain: Not on file  . Food insecurity:    Worry: Not on file    Inability: Not on file  . Transportation needs:    Medical: Not on file    Non-medical: Not on file  Tobacco Use  . Smoking status: Never Smoker  . Smokeless tobacco: Never Used  Substance and Sexual Activity  . Alcohol use: No    Alcohol/week: 0.0 oz  . Drug use: No  . Sexual activity: Not on file  Lifestyle  . Physical activity:    Days per week: Not on file    Minutes per session: Not on file  . Stress: Not on file  Relationships  . Social connections:    Talks on phone: Not on file    Gets together: Not on file    Attends religious service: Not on file    Active member of club or organization: Not on file    Attends meetings of clubs or organizations: Not on file    Relationship status: Not on file  . Intimate partner violence:    Fear of current or ex partner: Not on file    Emotionally abused: Not on file    Physically abused: Not on file    Forced sexual activity: Not on file  Other Topics Concern  . Not on file  Social History Narrative   Now has living will   Sons are health care POAs   Has DNR   No tube feeds if cognitively unaware   Review of Systems Weight down 6# Sleeps well No depression     Objective:   Physical Exam  Constitutional: No distress.  Cardiovascular: Normal rate, regular rhythm and normal heart sounds. Exam reveals no friction rub.  No murmur heard. Pulmonary/Chest: Effort normal and breath sounds normal. No stridor. No respiratory distress. She has no wheezes. She has no rales.  Abdominal: Soft. There is no tenderness.  Musculoskeletal: She exhibits no edema.   Psychiatric: She has a normal mood and affect. Her behavior is normal.  Assessment & Plan:

## 2017-12-12 NOTE — Assessment & Plan Note (Signed)
Regular today On flecainide and xarelto

## 2017-12-12 NOTE — Assessment & Plan Note (Signed)
Has lost more weight Discussed supplements, monitoring it at home, etc

## 2017-12-12 NOTE — Addendum Note (Signed)
Addended by: Pilar Grammes on: 12/12/2017 12:27 PM   Modules accepted: Orders

## 2018-01-17 DIAGNOSIS — J300 Vasomotor rhinitis: Secondary | ICD-10-CM | POA: Diagnosis not present

## 2018-01-17 DIAGNOSIS — J4530 Mild persistent asthma, uncomplicated: Secondary | ICD-10-CM | POA: Diagnosis not present

## 2018-01-17 DIAGNOSIS — H1045 Other chronic allergic conjunctivitis: Secondary | ICD-10-CM | POA: Diagnosis not present

## 2018-01-17 DIAGNOSIS — R21 Rash and other nonspecific skin eruption: Secondary | ICD-10-CM | POA: Diagnosis not present

## 2018-02-08 ENCOUNTER — Other Ambulatory Visit: Payer: Self-pay | Admitting: Internal Medicine

## 2018-02-18 DIAGNOSIS — Z961 Presence of intraocular lens: Secondary | ICD-10-CM | POA: Diagnosis not present

## 2018-02-20 NOTE — Progress Notes (Signed)
Cardiology Office Note  Date:  02/22/2018   ID:  Leah Hayden, DOB 05-08-1928, MRN [id number]  PCP:  Venia Carbon, MD   Chief Complaint  Patient presents with  . Other    6 month follow up. Patient denies chest pain and SOB. Meds reviewed verbally with patient.     HPI:  Leah Hayden is a very pleasant 84 year old woman with a history of  SVT, pauses on Holter,  admitted to the hospital August 2012 with atrial fibrillation  converted shortly after to normal sinus rhythm At least one additional episode of atrial fibrillation since then converted from afib to sinus, she had a 7.2 second pause, she was asymptomatic.  She was started on antiarrhythmic therapy in order to prevent recurrent afib and subsequently recurrent post-termination pauses. Eliquis was also started.  Cardiac catheterization in 2015 showing no significant CAD She presents for routine followup of her atrial fibrillation  In follow-up today she presents with neighbors and friends Memory is getting worse on aricept,  Reports she is stable Lives alone, denies any medication confusion  One episode spent too much time outside in the heat in the garden Had weak spell low blood pressure which improved with drinking fluids  Denies any near-syncope or syncope No chest pain or shortness of breath  Previous 30-day monitor to capture a week spell with no arrhythmia No significant atrial fibrillation on 30-day monitor through August 2018  Eye problem on the left Has seen eye specialist  Previously for atrial fibrillation was having fluttering and nausea, none recently  Retired from [employer]  EKG personally reviewed by myself on todays visit  shows normal sinus rhythm rate 56 bpm no significant ST or T-wave changes  Other past medical history  in the hospital 10/21/2014 for chest pain. Cardiac enzymes negative Laboratory work showing total cholesterol 129, LDL 59, HDL 55, normal renal function She was kept in the  hospital for hypertension and rule out, asymptomatic bradycardia. Her amiodarone was initially held for bradycardia although she was asymptomatic. Heart rate was 45 bpm at rest  Echocardiogram 10/21/2014 shows ejection fraction 60-65%, mildly dilated left atrium, otherwise a normal study  In the middle of April 2015, she awoke in the middle of the night with recurrent tachypalps and chest burning.  she called EMS the following morning.  She was found to be in afib with rvr and was taken to [facility] for eval.  There, troponin was elevated.  Initial ECG showed subtle anterolateral ST elevation.  She was initially treated with IV dilt and oral BB with rate improvement and later conversion.  In the setting of NSTEMI, she underwent cath, which showed nonobs CAD and nl LV fxn. F/U echo corroborated nl LV fxn.   History of torn biceps of her right arm at the end of September 2014. She wore a splint for 6 weeks. Seen by Dr. Daylene Katayama in [place].    PMH:   has a past medical history of Allergic rhinitis, Asthma, Biceps tendon tear, Diverticulosis of colon, GERD (gastroesophageal reflux disease), Hammer toe of right foot, colonic polyps, breast cancer, Meniere's disease, NSTEMI (non-ST elevated myocardial infarction) ([place]), Osteoarthritis, PAF (paroxysmal atrial fibrillation) ([place]), and Sinus brady-tachy syndrome ([place]).  PSH:    Past Surgical History:  Procedure Laterality Date  . ABDOMINAL HYSTERECTOMY    . CARDIAC CATHETERIZATION  [facility]   [facility]  . [place]   2nd to left toe  . ESOPHAGOGASTRODUODENOSCOPY  10/1999   inflammation only   .  FOREIGN BODY REMOVAL Right 01/02/2013   Procedure: FOREIGN BODY REMOVAL RIGHT INDEX FINGER;  Surgeon: Cammie Sickle., MD;  Location: [place];  Service: Orthopedics;  Laterality: Right;  . HAMMER TOE SURGERY  11/15   Dr Milinda Pointer  . MASTECTOMY  1984   left   . MASTECTOMY  1985   right  . PARTIAL HYSTERECTOMY  1966   w/ bladder  tack  . ROTATOR CUFF REPAIR  10/09   right   . TOE SURGERY  2001/2002   2nd/3rd,left     Current Outpatient Medications  Medication Sig Dispense Refill  . albuterol (PROVENTIL HFA) 108 (90 BASE) MCG/ACT inhaler Inhale 2 puffs into the lungs every 6 (six) hours as needed.      . cholecalciferol (VITAMIN D) 1000 units tablet Take 5,000 Units by mouth daily.    . cyanocobalamin 500 MCG tablet Take 500 mcg by mouth every other day.     . donepezil (ARICEPT) 5 MG tablet TAKE 1 TABLET (5 MG TOTAL) BY MOUTH AT BEDTIME. 90 tablet 3  . estradiol (ESTRACE) 0.1 MG/GM vaginal cream Place 1 Applicatorful vaginally 2 (two) times a week. 42.5 g 5  . flecainide (TAMBOCOR) 50 MG tablet TAKE 1 TABLET (50 MG TOTAL) BY MOUTH 2 (TWO) TIMES DAILY. 180 tablet 3  . Fluticasone-Salmeterol (ADVAIR DISKUS) 100-50 MCG/DOSE AEPB Inhale 1 puff into the lungs every 12 (twelve) hours.      . Ketotifen Fumarate (REFRESH EYE ITCH RELIEF OP) Apply to eye.    . montelukast (SINGULAIR) 10 MG tablet Take 10 mg by mouth daily.      . nitroGLYCERIN (NITROSTAT) 0.4 MG SL tablet Place 1 tablet (0.4 mg total) under the tongue every 5 (five) minutes as needed for chest pain. 25 tablet 1  . XARELTO 20 MG TABS tablet TAKE 1 TABLET BY MOUTH EVERY DAY WITH SUPPER 30 tablet 3   No current facility-administered medications for this visit.      Allergies:   Amiodarone and Other   Social History:  The patient  reports that she has never smoked. She has never used smokeless tobacco. She reports that she does not drink alcohol or use drugs.   Family History:   family history includes Arthritis in her brother and mother; Cancer in her maternal grandfather; Colon cancer in her maternal grandfather; Diabetes in her maternal grandmother; Gout in her mother; Heart disease in her father and mother; Heart failure in her father and mother.    Review of Systems: Review of Systems  Constitutional: Negative.   Respiratory: Negative.    Cardiovascular: Negative.   Gastrointestinal: Negative.   Musculoskeletal: Negative.   Skin: Negative.   Psychiatric/Behavioral: Positive for memory loss.  All other systems reviewed and are negative.    PHYSICAL EXAM: VS:  BP (!) 154/72 (BP Location: Left Arm, Patient Position: Sitting, Cuff Size: Normal)   Pulse (!) 56   Ht 5\' 8"  (1.727 m)   Wt 128 lb (58.1 kg)   BMI 19.46 kg/m  , BMI Body mass index is 19.46 kg/m.  No significant change compared to previous office visit  GEN: Well nourished, well developed, in no acute distress  HEENT: normal  Neck: no JVD, carotid bruits, or masses Cardiac: RRR; no murmurs, rubs, or gallops,no edema  Respiratory:  clear to auscultation bilaterally, normal work of breathing GI: soft, nontender, nondistended, + BS Leah: no deformity or atrophy  Skin: warm and dry, no rash Neuro:  Strength and sensation  are intact Psych: euthymic mood, full affect    Recent Labs: No results found for requested labs within last 8760 hours.    Lipid Panel Lab Results  Component Value Date   CHOL 196 06/14/2016   HDL 65.30 06/14/2016   LDLCALC 115 (H) 06/14/2016   TRIG 80.0 06/14/2016      Wt Readings from Last 3 Encounters:  02/22/18 128 lb (58.1 kg)  12/12/17 128 lb (58.1 kg)  06/15/17 134 lb (60.8 kg)       ASSESSMENT AND PLAN:  PAF (paroxysmal atrial fibrillation) ([place]) - Plan: EKG 12-Lead 30-day monitor as detailed above with no significant arrhythmia No medications changes made Denies any near-syncope or syncope, no tachycardia  SVT/ PSVT/ PAT Denies having any significant arrhythmia, no medication changes made Continue current medications. Stable  Atherosclerosis of native coronary artery of native heart with angina pectoris  (HCC) Previous chest tightness, none recently Previous cardiac catheterization with no significant disease She is requesting refill of her nitroglycerin No further workup needed  Memory loss:  Managed  by primary care, started on Aricept Needing assistance at home Mild cognitive decline Recommended regular walking program, puzzles and reading   Total encounter time more than 25 minutes  Greater than 50% was spent in counseling and coordination of care with the patient   Disposition:   F/U  12 months   No orders of the defined types were placed in this encounter.    Signed, Esmond Plants, M.D., Ph.D. 02/22/2018  [place], [place]

## 2018-02-22 ENCOUNTER — Encounter: Payer: Self-pay | Admitting: Cardiovascular Disease

## 2018-02-22 ENCOUNTER — Ambulatory Visit: Payer: PPO | Admitting: Cardiovascular Disease

## 2018-02-22 VITALS — BP 154/72 | HR 56 | Ht 68.0 in | Wt 128.0 lb

## 2018-02-22 DIAGNOSIS — I480 Paroxysmal atrial fibrillation: Secondary | ICD-10-CM | POA: Diagnosis not present

## 2018-02-22 DIAGNOSIS — I471 Supraventricular tachycardia: Secondary | ICD-10-CM

## 2018-02-22 DIAGNOSIS — I495 Sick sinus syndrome: Secondary | ICD-10-CM

## 2018-02-22 DIAGNOSIS — E782 Mixed hyperlipidemia: Secondary | ICD-10-CM

## 2018-02-22 DIAGNOSIS — I25119 Atherosclerotic heart disease of native coronary artery with unspecified angina pectoris: Secondary | ICD-10-CM | POA: Diagnosis not present

## 2018-02-22 DIAGNOSIS — F0150 Vascular dementia without behavioral disturbance: Secondary | ICD-10-CM

## 2018-02-22 MED ORDER — NITROGLYCERIN 0.4 MG SL SUBL: 0.4000 mg | SUBLINGUAL_TABLET | SUBLINGUAL | 1 refills | Status: AC | PRN

## 2018-02-22 NOTE — Patient Instructions (Signed)

## 2018-03-13 ENCOUNTER — Other Ambulatory Visit: Payer: Self-pay

## 2018-03-13 ENCOUNTER — Other Ambulatory Visit: Payer: Self-pay | Admitting: Cardiovascular Disease

## 2018-03-13 DIAGNOSIS — Z7901 Long term (current) use of anticoagulants: Secondary | ICD-10-CM

## 2018-03-13 NOTE — Telephone Encounter (Signed)
Please review for refill, thanks ! 

## 2018-04-12 ENCOUNTER — Inpatient Hospital Stay
Admission: EM | Admit: 2018-04-12 | Discharge: 2018-04-15 | DRG: 309 | Disposition: A | Discharge status: Home Health | Payer: PPO | Attending: Internal Medicine | Admitting: Internal Medicine

## 2018-04-12 ENCOUNTER — Encounter: Payer: Self-pay | Admitting: Emergency Medicine

## 2018-04-12 ENCOUNTER — Other Ambulatory Visit: Payer: Self-pay

## 2018-04-12 ENCOUNTER — Inpatient Hospital Stay (HOSPITAL_COMMUNITY)
Admit: 2018-04-12 | Discharge: 2018-04-12 | Disposition: A | Discharge status: Home / Self Care | Payer: PPO | Attending: Family Medicine | Admitting: Family Medicine

## 2018-04-12 DIAGNOSIS — I499 Cardiac arrhythmia, unspecified: Secondary | ICD-10-CM | POA: Diagnosis present

## 2018-04-12 DIAGNOSIS — Z881 Allergy status to other antibiotic agents status: Secondary | ICD-10-CM

## 2018-04-12 DIAGNOSIS — Z79890 Hormone replacement therapy (postmenopausal): Secondary | ICD-10-CM

## 2018-04-12 DIAGNOSIS — J45909 Unspecified asthma, uncomplicated: Secondary | ICD-10-CM | POA: Diagnosis not present

## 2018-04-12 DIAGNOSIS — I4891 Unspecified atrial fibrillation: Secondary | ICD-10-CM

## 2018-04-12 DIAGNOSIS — H9190 Unspecified hearing loss, unspecified ear: Secondary | ICD-10-CM | POA: Diagnosis present

## 2018-04-12 DIAGNOSIS — I248 Other forms of acute ischemic heart disease: Secondary | ICD-10-CM | POA: Diagnosis present

## 2018-04-12 DIAGNOSIS — I252 Old myocardial infarction: Secondary | ICD-10-CM | POA: Diagnosis not present

## 2018-04-12 DIAGNOSIS — N3000 Acute cystitis without hematuria: Secondary | ICD-10-CM | POA: Diagnosis not present

## 2018-04-12 DIAGNOSIS — R001 Bradycardia, unspecified: Secondary | ICD-10-CM | POA: Diagnosis not present

## 2018-04-12 DIAGNOSIS — Z8249 Family history of ischemic heart disease and other diseases of the circulatory system: Secondary | ICD-10-CM

## 2018-04-12 DIAGNOSIS — I2510 Atherosclerotic heart disease of native coronary artery without angina pectoris: Secondary | ICD-10-CM | POA: Diagnosis not present

## 2018-04-12 DIAGNOSIS — R112 Nausea with vomiting, unspecified: Secondary | ICD-10-CM | POA: Diagnosis not present

## 2018-04-12 DIAGNOSIS — F0280 Dementia in other diseases classified elsewhere without behavioral disturbance: Secondary | ICD-10-CM | POA: Diagnosis not present

## 2018-04-12 DIAGNOSIS — Z66 Do not resuscitate: Secondary | ICD-10-CM | POA: Diagnosis present

## 2018-04-12 DIAGNOSIS — Z9010 Acquired absence of unspecified breast and nipple: Secondary | ICD-10-CM | POA: Diagnosis not present

## 2018-04-12 DIAGNOSIS — Z7951 Long term (current) use of inhaled steroids: Secondary | ICD-10-CM | POA: Diagnosis not present

## 2018-04-12 DIAGNOSIS — Z7901 Long term (current) use of anticoagulants: Secondary | ICD-10-CM | POA: Diagnosis not present

## 2018-04-12 DIAGNOSIS — Z853 Personal history of malignant neoplasm of breast: Secondary | ICD-10-CM

## 2018-04-12 DIAGNOSIS — R531 Weakness: Secondary | ICD-10-CM

## 2018-04-12 DIAGNOSIS — F0390 Unspecified dementia without behavioral disturbance: Secondary | ICD-10-CM | POA: Diagnosis not present

## 2018-04-12 DIAGNOSIS — I10 Essential (primary) hypertension: Secondary | ICD-10-CM | POA: Diagnosis not present

## 2018-04-12 DIAGNOSIS — I361 Nonrheumatic tricuspid (valve) insufficiency: Secondary | ICD-10-CM

## 2018-04-12 DIAGNOSIS — G301 Alzheimer's disease with late onset: Secondary | ICD-10-CM

## 2018-04-12 DIAGNOSIS — I495 Sick sinus syndrome: Principal | ICD-10-CM | POA: Diagnosis present

## 2018-04-12 DIAGNOSIS — R262 Difficulty in walking, not elsewhere classified: Secondary | ICD-10-CM | POA: Diagnosis not present

## 2018-04-12 DIAGNOSIS — I480 Paroxysmal atrial fibrillation: Secondary | ICD-10-CM | POA: Diagnosis not present

## 2018-04-12 DIAGNOSIS — Z79899 Other long term (current) drug therapy: Secondary | ICD-10-CM

## 2018-04-12 DIAGNOSIS — N390 Urinary tract infection, site not specified: Secondary | ICD-10-CM | POA: Diagnosis not present

## 2018-04-12 DIAGNOSIS — K219 Gastro-esophageal reflux disease without esophagitis: Secondary | ICD-10-CM | POA: Diagnosis not present

## 2018-04-12 DIAGNOSIS — Z888 Allergy status to other drugs, medicaments and biological substances status: Secondary | ICD-10-CM | POA: Diagnosis not present

## 2018-04-12 LAB — CBC WITH DIFFERENTIAL/PLATELET
Basophils Absolute: 0 10*3/uL (ref 0–0.1)
Basophils Relative: 0 %
Eosinophils Absolute: 0.1 10*3/uL (ref 0–0.7)
Eosinophils Relative: 2 %
HCT: 39.4 % (ref 35.0–47.0)
Hemoglobin: 13.6 g/dL (ref 12.0–16.0)
Lymphocytes Relative: 29 %
Lymphs Abs: 1.4 10*3/uL (ref 1.0–3.6)
MCH: 32.1 pg (ref 26.0–34.0)
MCHC: 34.7 g/dL (ref 32.0–36.0)
MCV: 92.5 fL (ref 80.0–100.0)
Monocytes Absolute: 0.4 10*3/uL (ref 0.2–0.9)
Monocytes Relative: 8 %
Neutro Abs: 3.1 10*3/uL (ref 1.4–6.5)
Neutrophils Relative %: 61 %
Platelets: 169 10*3/uL (ref 150–440)
RBC: 4.25 MIL/uL (ref 3.80–5.20)
RDW: 13.2 % (ref 11.5–14.5)
WBC: 5 10*3/uL (ref 3.6–11.0)

## 2018-04-12 LAB — COMPREHENSIVE METABOLIC PANEL
ALT: 21 U/L (ref 0–44)
AST: 23 U/L (ref 15–41)
Albumin: 3.9 g/dL (ref 3.5–5.0)
Alkaline Phosphatase: 61 U/L (ref 38–126)
Anion gap: 8 (ref 5–15)
BUN: 21 mg/dL (ref 8–23)
CO2: 31 mmol/L (ref 22–32)
Calcium: 9.5 mg/dL (ref 8.9–10.3)
Chloride: 102 mmol/L (ref 98–111)
Creatinine, Ser: 0.85 mg/dL (ref 0.44–1.00)
GFR calc Af Amer: >60 mL/min (ref >60)
GFR calc non Af Amer: 59 mL/min — ABNORMAL LOW (ref >60)
Glucose, Bld: 102 mg/dL — ABNORMAL HIGH (ref 70–99)
Potassium: 4 mmol/L (ref 3.5–5.1)
Sodium: 141 mmol/L (ref 135–145)
Total Bilirubin: 1.2 mg/dL (ref 0.3–1.2)
Total Protein: 6.5 g/dL (ref 6.5–8.1)

## 2018-04-12 LAB — URINALYSIS, COMPLETE (UACMP) WITH MICROSCOPIC
Bilirubin Urine: NEGATIVE
Glucose, UA: NEGATIVE mg/dL
Ketones, ur: NEGATIVE mg/dL
Nitrite: NEGATIVE
Protein, ur: NEGATIVE mg/dL
Specific Gravity, Urine: 1.003 — ABNORMAL LOW (ref 1.005–1.030)
pH: 7 (ref 5.0–8.0)

## 2018-04-12 LAB — TSH: TSH: 1.143 u[IU]/mL (ref 0.350–4.500)

## 2018-04-12 LAB — TROPONIN I
Troponin I: <0.03 ng/mL (ref <0.03)
Troponin I: 0.06 ng/mL (ref <0.03)
Troponin I: 0.05 ng/mL (ref <0.03)

## 2018-04-12 LAB — GLUCOSE, CAPILLARY: Glucose-Capillary: 120 mg/dL — ABNORMAL HIGH (ref 70–99)

## 2018-04-12 LAB — MRSA PCR SCREENING: MRSA by PCR: NEGATIVE

## 2018-04-12 MED ORDER — ONDANSETRON HCL 4 MG PO TABS: 4.0000 mg | ORAL_TABLET | Freq: Four times a day (QID) | ORAL | Status: DC | PRN

## 2018-04-12 MED ORDER — NITROGLYCERIN 0.4 MG SL SUBL: 0.4000 mg | SUBLINGUAL_TABLET | SUBLINGUAL | Status: DC | PRN

## 2018-04-12 MED ORDER — VITAMIN D3 25 MCG (1000 UNIT) PO TABS
5000.0000 [IU] | ORAL_TABLET | Freq: Every day | ORAL | Status: DC
  Administered 2018-04-12 – 2018-04-15 (×4): 5000 [IU] via ORAL
  Filled 2018-04-12 (×4): qty 5

## 2018-04-12 MED ORDER — DILTIAZEM HCL 100 MG IV SOLR: 5.0000 mg/h | Freq: Once | INTRAVENOUS | Status: DC

## 2018-04-12 MED ORDER — ACETAMINOPHEN 325 MG PO TABS: 650.0000 mg | ORAL_TABLET | Freq: Four times a day (QID) | ORAL | Status: DC | PRN

## 2018-04-12 MED ORDER — HYDROCODONE-ACETAMINOPHEN 5-325 MG PO TABS: 1.0000 | ORAL_TABLET | ORAL | Status: DC | PRN

## 2018-04-12 MED ORDER — MOMETASONE FURO-FORMOTEROL FUM 100-5 MCG/ACT IN AERO
2.0000 | INHALATION_SPRAY | Freq: Two times a day (BID) | RESPIRATORY_TRACT | Status: DC
  Filled 2018-04-12 (×3): qty 8.8

## 2018-04-12 MED ORDER — SODIUM CHLORIDE 0.9% FLUSH: 3.0000 mL | INTRAVENOUS | Status: DC | PRN

## 2018-04-12 MED ORDER — ADULT MULTIVITAMIN W/MINERALS CH: 1.0000 | ORAL_TABLET | Freq: Every day | ORAL | Status: DC

## 2018-04-12 MED ORDER — ACETAMINOPHEN 650 MG RE SUPP: 650.0000 mg | Freq: Four times a day (QID) | RECTAL | Status: DC | PRN

## 2018-04-12 MED ORDER — POLYETHYLENE GLYCOL 3350 17 G PO PACK: 17.0000 g | PACK | Freq: Every day | ORAL | Status: DC | PRN

## 2018-04-12 MED ORDER — DONEPEZIL HCL 5 MG PO TABS
5.0000 mg | ORAL_TABLET | Freq: Every day | ORAL | Status: DC
  Administered 2018-04-12 – 2018-04-14 (×3): 5 mg via ORAL
  Filled 2018-04-12 (×5): qty 1

## 2018-04-12 MED ORDER — ALBUTEROL SULFATE (2.5 MG/3ML) 0.083% IN NEBU: 3.0000 mL | INHALATION_SOLUTION | Freq: Four times a day (QID) | RESPIRATORY_TRACT | Status: DC | PRN

## 2018-04-12 MED ORDER — SODIUM CHLORIDE 0.9% FLUSH
3.0000 mL | Freq: Two times a day (BID) | INTRAVENOUS | Status: DC
  Administered 2018-04-12 – 2018-04-14 (×4): 3 mL via INTRAVENOUS

## 2018-04-12 MED ORDER — MAGNESIUM SULFATE 2 GM/50ML IV SOLN
2.0000 g | Freq: Once | INTRAVENOUS | Status: AC
  Administered 2018-04-12: 2 g via INTRAVENOUS
  Filled 2018-04-12: qty 50

## 2018-04-12 MED ORDER — ONDANSETRON HCL 4 MG/2ML IJ SOLN
4.0000 mg | Freq: Once | INTRAMUSCULAR | Status: AC
  Administered 2018-04-12: 4 mg via INTRAVENOUS
  Filled 2018-04-12: qty 2

## 2018-04-12 MED ORDER — RIVAROXABAN 15 MG PO TABS
15.0000 mg | ORAL_TABLET | Freq: Every day | ORAL | Status: DC
  Administered 2018-04-12 – 2018-04-14 (×3): 15 mg via ORAL
  Filled 2018-04-12 (×4): qty 1

## 2018-04-12 MED ORDER — DILTIAZEM HCL 25 MG/5ML IV SOLN: 10.0000 mg | Freq: Once | INTRAVENOUS | Status: DC

## 2018-04-12 MED ORDER — FLECAINIDE ACETATE 50 MG PO TABS
50.0000 mg | ORAL_TABLET | Freq: Two times a day (BID) | ORAL | Status: DC
  Filled 2018-04-12 (×2): qty 1

## 2018-04-12 MED ORDER — SODIUM CHLORIDE 0.9 % IV SOLN
INTRAVENOUS | Status: DC
  Administered 2018-04-12 – 2018-04-13 (×2): via INTRAVENOUS

## 2018-04-12 MED ORDER — SODIUM CHLORIDE 0.9 % IV SOLN
1.0000 g | INTRAVENOUS | Status: AC
  Administered 2018-04-12 – 2018-04-14 (×3): 1 g via INTRAVENOUS
  Filled 2018-04-12: qty 1
  Filled 2018-04-12: qty 10
  Filled 2018-04-12 (×2): qty 1

## 2018-04-12 MED ORDER — ONDANSETRON HCL 4 MG/2ML IJ SOLN: 4.0000 mg | Freq: Four times a day (QID) | INTRAMUSCULAR | Status: DC | PRN

## 2018-04-12 MED ORDER — FLECAINIDE ACETATE 50 MG PO TABS
50.0000 mg | ORAL_TABLET | Freq: Once | ORAL | Status: DC
  Filled 2018-04-12: qty 1

## 2018-04-12 MED ORDER — SODIUM CHLORIDE 0.9 % IV BOLUS
1000.0000 mL | Freq: Once | INTRAVENOUS | Status: AC
  Administered 2018-04-12: 1000 mL via INTRAVENOUS

## 2018-04-12 MED ORDER — ADULT MULTIVITAMIN W/MINERALS CH
1.0000 | ORAL_TABLET | Freq: Every day | ORAL | Status: DC
  Administered 2018-04-12 – 2018-04-15 (×4): 1 via ORAL
  Filled 2018-04-12 (×4): qty 1

## 2018-04-12 MED ORDER — MONTELUKAST SODIUM 10 MG PO TABS
10.0000 mg | ORAL_TABLET | Freq: Every evening | ORAL | Status: DC
  Administered 2018-04-12 – 2018-04-14 (×3): 10 mg via ORAL
  Filled 2018-04-12 (×3): qty 1

## 2018-04-12 MED ORDER — CEPHALEXIN 500 MG PO CAPS
500.0000 mg | ORAL_CAPSULE | Freq: Once | ORAL | Status: AC
  Administered 2018-04-12: 500 mg via ORAL
  Filled 2018-04-12: qty 1

## 2018-04-12 MED ORDER — SODIUM CHLORIDE 0.9 % IV SOLN: 250.0000 mL | INTRAVENOUS | Status: DC | PRN

## 2018-04-12 MED ORDER — VITAMIN B-12 1000 MCG PO TABS
500.0000 ug | ORAL_TABLET | ORAL | Status: DC
  Administered 2018-04-12 – 2018-04-14 (×2): 500 ug via ORAL
  Filled 2018-04-12: qty 5
  Filled 2018-04-12: qty 1
  Filled 2018-04-12 (×2): qty 5

## 2018-04-12 NOTE — ED Triage Notes (Signed)
EMS pt to rm 24 with 2 days of weakness and diarrhea x 2 episodes this morning.

## 2018-04-12 NOTE — ED Provider Notes (Addendum)
[facility] Emergency Department Provider Note  ____________________________________________  Time seen: Approximately 7:33 AM  I have reviewed the triage vital signs and the nursing notes.   HISTORY  Chief Complaint Emesis and Weakness   HPI Leah Hayden is a 84 y.o. female with a history of atrial fibrillation on Eliquis, asthma, CAD, diverticulosis who presents for evaluation of generalized weakness.  Patient reports intermittent diarrhea over the last several weeks.  She had 2 episodes yesterday and 2 today.  She reports that the stool looks green.  No melena, hematemesis or hematochezia.  She has had nausea but no vomiting.  No fever or chills.  No abdominal pain.  No cough, chest pain or shortness of breath.  No history of C. difficile.  Patient reports generalized weakness which has been progressive over the last 2 weeks.  Patient lives alone but her grandson lives next door.  No falls.  Past Medical History:  Diagnosis Date  . Allergic rhinitis   . Asthma   . Biceps tendon tear    a. right->s/p surgical repair winter of 2014.  . Diverticulosis of colon   . GERD (gastroesophageal reflux disease)   . Hammer toe of right foot   . Hx of colonic polyps   . HX: breast cancer   . Meniere's disease   . NSTEMI (non-ST elevated myocardial infarction) ([place])    a. 11/2012 in setting of rapid afib;  b. 11/2012 Cath: nl EF, nonobs CAD->Med Rx;  b. 11/2012 Echo: EF 60-65%, mild LVH, mild MR/TR.  . Osteoarthritis   . PAF (paroxysmal atrial fibrillation) ([place])    a. Eliquis and amio initiated 11/2012 in setting of admission with RVR and NSTEMI;  b. Subsequently changed to flecainide and xarelto;  c. 11/2013 Echo: EF 60-65%, mild LVH, mold MR/TR.  Marland Kitchen Sinus brady-tachy syndrome ([place])    a. Post-conversion pause of 7.2 seconds during 11/2012 hospitalization @ [facility].    Patient Active Problem List   Diagnosis Date Noted  . Medicare annual wellness visit, subsequent  06/14/2016  . Vitamin B12 deficiency 11/10/2015  . Vascular dementia, uncomplicated 81/85/6314  . Advance directive discussed with patient 06/07/2015  . Malnutrition of mild degree ([place]) 05/26/2014  . Hyperlipidemia 05/13/2014  . PAF (paroxysmal atrial fibrillation) ([place])   . Routine general medical examination at a health care facility 05/03/2012  . SVT/ PSVT/ PAT 01/10/2010  . SICK SINUS/ TACHY-BRADY SYNDROME 01/10/2010  . Generalized osteoarthrosis, involving multiple sites 12/18/2007  . ALLERGIC RHINITIS 03/07/2007  . Mild intermittent asthma in adult without complication 97/09/6376  . GERD 03/07/2007  . DIVERTICULOSIS, COLON 03/07/2007  . BREAST CANCER, HX OF 03/07/2007    Past Surgical History:  Procedure Laterality Date  . ABDOMINAL HYSTERECTOMY    . CARDIAC CATHETERIZATION  11/2013   [facility]  . [place]   2nd to left toe  . ESOPHAGOGASTRODUODENOSCOPY  10/1999   inflammation only   . FOREIGN BODY REMOVAL Right 01/02/2013   Procedure: FOREIGN BODY REMOVAL RIGHT INDEX FINGER;  Surgeon: Cammie Sickle., MD;  Location: [place];  Service: Orthopedics;  Laterality: Right;  . HAMMER TOE SURGERY  11/15   Dr Milinda Pointer  . MASTECTOMY  1984   left   . MASTECTOMY  1985   right  . PARTIAL HYSTERECTOMY  1966   w/ bladder tack  . ROTATOR CUFF REPAIR  10/09   right   . TOE SURGERY  2001/2002   2nd/3rd,left  Prior to Admission medications   Medication Sig Start Date End Date Taking? Authorizing Provider  albuterol (PROVENTIL HFA) 108 (90 BASE) MCG/ACT inhaler Inhale 2 puffs into the lungs every 6 (six) hours as needed.      [provider]  cholecalciferol (VITAMIN D) 1000 units tablet Take 5,000 Units by mouth daily.    [provider]  cyanocobalamin 500 MCG tablet Take 500 mcg by mouth every other day.     [provider]  donepezil (ARICEPT) 5 MG tablet TAKE 1 TABLET (5 MG TOTAL) BY MOUTH AT BEDTIME. 02/08/18    Venia Carbon, MD  estradiol (ESTRACE) 0.1 MG/GM vaginal cream Place 1 Applicatorful vaginally 2 (two) times a week. 11/27/16   Venia Carbon, MD  flecainide (TAMBOCOR) 50 MG tablet TAKE 1 TABLET (50 MG TOTAL) BY MOUTH 2 (TWO) TIMES DAILY. 09/05/17   Minna Merritts, MD  Fluticasone-Salmeterol (ADVAIR DISKUS) 100-50 MCG/DOSE AEPB Inhale 1 puff into the lungs every 12 (twelve) hours.      [provider]  Ketotifen Fumarate ([place] [facility]) Apply to eye.    [provider]  montelukast (SINGULAIR) 10 MG tablet Take 10 mg by mouth daily.      [provider]  nitroGLYCERIN (NITROSTAT) 0.4 MG SL tablet Place 1 tablet (0.4 mg total) under the tongue every 5 (five) minutes as needed for chest pain. 02/22/18   Gollan, Kathlene November, MD  XARELTO 20 MG TABS tablet TAKE 1 TABLET BY MOUTH EVERY DAY WITH SUPPER 03/13/18   Minna Merritts, MD    Allergies Amiodarone and Other  Family History  Problem Relation Age of Onset  . Heart failure Mother   . Gout Mother   . Heart disease Mother        heart failure  . Arthritis Mother   . Heart failure Father   . Heart disease Father        heart failure  . Diabetes Maternal Grandmother   . Colon cancer Maternal Grandfather   . Cancer Maternal Grandfather        colon  . Arthritis Brother     Social History Social History   Tobacco Use  . Smoking status: Never Smoker  . Smokeless tobacco: Never Used  Substance Use Topics  . Alcohol use: No    Alcohol/week: 0.0 standard drinks  . Drug use: No    Review of Systems  Constitutional: Negative for fever. + generalized weakness Eyes: Negative for visual changes. ENT: Negative for sore throat. Neck: No neck pain  Cardiovascular: Negative for chest pain. Respiratory: Negative for shortness of breath. Gastrointestinal: Negative for abdominal pain, vomiting. + nausea and diarrhea. Genitourinary: Negative for dysuria. Musculoskeletal: Negative for back  pain. Skin: Negative for rash. Neurological: Negative for headaches, weakness or numbness. Psych: No SI or HI  ____________________________________________   PHYSICAL EXAM:  VITAL SIGNS: ED Triage Vitals  Enc Vitals Group     BP 04/12/18 0652 (!) 194/67     Pulse Rate 04/12/18 0652 (!) 57     Resp 04/12/18 0652 18     Temp 04/12/18 0652 97.6 F (36.4 C)     Temp Source 04/12/18 0652 Oral     SpO2 04/12/18 0652 99 %     Weight 04/12/18 0653 125 lb (56.7 kg)     Height 04/12/18 0653 5\' 4"  (1.626 m)     Head Circumference --      Peak Flow --  Pain Score 04/12/18 0652 0     Pain Loc --      Pain Edu? --      Excl. in [place]? --     Constitutional: Alert and oriented. Well appearing and in no apparent distress. HEENT:      Head: Normocephalic and atraumatic.         Eyes: Conjunctivae are normal. Sclera is non-icteric.       Mouth/Throat: Mucous membranes are moist.       Neck: Supple with no signs of meningismus. Cardiovascular: Regular rate and rhythm. No murmurs, gallops, or rubs. 2+ symmetrical distal pulses are present in all extremities. No JVD. Respiratory: Normal respiratory effort. Lungs are clear to auscultation bilaterally. No wheezes, crackles, or rhonchi.  Gastrointestinal: Soft, non tender, and non distended with positive bowel sounds. No rebound or guarding. Musculoskeletal: Nontender with normal range of motion in all extremities. No edema, cyanosis, or erythema of extremities. Neurologic: Normal speech and language. Face is symmetric. Moving all extremities. No gross focal neurologic deficits are appreciated. Skin: Skin is warm, dry and intact. No rash noted. Psychiatric: Mood and affect are normal. Speech and behavior are normal.  ____________________________________________   LABS (all labs ordered are listed, but only abnormal results are displayed)  Labs Reviewed  URINALYSIS, COMPLETE (UACMP) WITH MICROSCOPIC - Abnormal; Notable for the following  components:      Result Value   Color, Urine STRAW (*)    APPearance CLEAR (*)    Specific Gravity, Urine 1.003 (*)    Hgb urine dipstick MODERATE (*)    Leukocytes, UA TRACE (*)    Bacteria, UA FEW (*)    All other components within normal limits  COMPREHENSIVE METABOLIC PANEL - Abnormal; Notable for the following components:   Glucose, Bld 102 (*)    GFR calc non Af Amer 59 (*)    All other components within normal limits  URINE CULTURE  CBC WITH DIFFERENTIAL/PLATELET  CBG MONITORING, ED   ____________________________________________  EKG  ED ECG REPORT I, Rudene Re, the attending physician, personally viewed and interpreted this ECG.  Sinus bradycardia, rate of 55, normal intervals, normal axis, no ST elevations or depressions, T wave inversions in 3, aVF, and V3.  No significant changes when compared to prior.  10:26 -normal sinus rhythm, rate of 63, right bundle branch block, normal QRS, left axis deviation, no ST elevations or depressions. ____________________________________________  RADIOLOGY  none  ____________________________________________   PROCEDURES  Procedure(s) performed: None Procedures Critical Care performed:  None ____________________________________________   INITIAL IMPRESSION / ASSESSMENT AND PLAN / ED COURSE  84 y.o. female with a history of atrial fibrillation on Eliquis, asthma, CAD, diverticulosis who presents for evaluation of generalized weakness in the setting of intermittent diarrhea and nausea for the last few weeks.  Patient is well-appearing, no distress, she has normal vital signs, her abdomen is soft with no tenderness throughout, EKG shows no evidence of dysrhythmias or ischemia.  Will check labs to rule out anemia, dehydration, AKI, electrolyte abnormalities.  Will give IV fluids and Zofran.  Clinical Course as of Apr 12 1028  Fri Apr 12, 2018  0937 Patient felt improved after IV fluids and Zofran.  Her UA shows bacteria  and leuk esterases therefore she was started on Keflex.  Urine culture is pending.  I went back to reassess and patient was found to be in A. fib.  She had not taken her flecainide this morning.  Flecainide was given.  We will  continue to monitor.   [CV]  5520 Patient noted to have a 5s pause on telemetry and immediately after that converted from afib to sinus bradycardia with rate in the 50s. Normal BP and mentation. Will admit to telemetry and cardiology evaluation for possible pacemaker placement.   [CV]    Clinical Course User Index [CV] Alfred Levins [place], MD     As part of my medical decision making, I reviewed the following data within the [place] History obtained from family, Nursing notes reviewed and incorporated, Labs reviewed , EKG interpreted , Old EKG reviewed, Old chart reviewed, Radiograph reviewed , Discussed with admitting physician , Notes from prior ED visits and [place] Controlled Substance Database    Pertinent labs & imaging results that were available during my care of the patient were reviewed by me and considered in my medical decision making (see chart for details).    ____________________________________________   FINAL CLINICAL IMPRESSION(S) / ED DIAGNOSES  Final diagnoses:  Generalized weakness  Sinus node dysfunction (HCC)      NEW MEDICATIONS STARTED DURING THIS VISIT:  ED Discharge Orders    None       Note:  This document was prepared using Dragon voice recognition software and may include unintentional dictation errors.    Rudene Re, MD 04/12/18 [place], [place], MD 04/12/18 1031

## 2018-04-12 NOTE — ED Notes (Signed)
.   Pt is resting, Respirations even and unlabored, NAD. Stretcher lowest postion and locked. Call bell within reach. Denies any needs at this time RN will continue to monitor.    

## 2018-04-12 NOTE — ED Notes (Signed)
Pt was given home medication of Tamabocor per verbal order. Verbal order to give Cardizem if rate still high . RN will continue to monitor.

## 2018-04-12 NOTE — Progress Notes (Signed)
Pharmacist - Prescriber Communication  Xarelto dose modified from 20 mg po daily to 15 mg po daily with supper due to 1. Indication is AF and 2. CrCl 15 to 50 mL/min.   Nathan A. Dawson, [place].D., BCPS Clinical Pharmacist 04/12/2018 16:36

## 2018-04-12 NOTE — ED Notes (Signed)
.  Pt placed on bedpan.  Pt received peri care at this time.  Pt brief was changed and pt was repositioned.  RN will monitor.

## 2018-04-12 NOTE — Progress Notes (Signed)
*  PRELIMINARY RESULTS* Echocardiogram 2D Echocardiogram has been performed.  [place] 04/12/2018, 10:20 PM

## 2018-04-12 NOTE — Consult Note (Signed)
Cardiology Consult    Patient ID: Leah Hayden MRN: [id number], DOB/AGE: [birth year]   Admit date: 04/12/2018 Date of Consult: 04/12/2018  Primary Physician: Venia Carbon, MD Primary Cardiologist: Ida Rogue, MD Requesting Provider: Ashok Norris, MD  Patient Profile    Leah Hayden is a 84 y.o. female with a history of paroxysmal atrial fibrillation on chronic flecainide and Xarelto, tachybradycardia syndrome, non-STEMI in April 2014 with nonobstructive CAD, PSVT, GERD, asthma, and breast cancer, who is being seen today for the evaluation of A. fib with RVR and postconversion pause at the request of Dr. Jerelyn Charles.  Past Medical History   Past Medical History:  Diagnosis Date  . Allergic rhinitis   . Asthma   . Biceps tendon tear    a. right->s/p surgical repair winter of 2014.  . Diverticulosis of colon   . GERD (gastroesophageal reflux disease)   . Hammer toe of right foot   . Hx of colonic polyps   . HX: breast cancer   . Meniere's disease   . NSTEMI (non-ST elevated myocardial infarction) ([place])    a. 11/2012 in setting of rapid afib;  b. 11/2012 Cath: nl EF, nonobs CAD->Med Rx;  b. 11/2012 Echo: EF 60-65%, mild LVH, mild MR/TR.  . Osteoarthritis   . PAF (paroxysmal atrial fibrillation) ([place])    a. Eliquis and amio initiated 11/2012 in setting of admission with RVR and NSTEMI;  b. Subsequently changed to flecainide and xarelto;  c. 11/2013 Echo: EF 60-65%, mild LVH, mod MR/TR.  [place] Kitchen Sinus brady-tachy syndrome ([place])    a. Post-conversion pause of 7.2 seconds during 11/2012 hospitalization @ [facility]; b. 04/2017 Event Monitor: No significant arrhythmias.    Past Surgical History:  Procedure Laterality Date  . ABDOMINAL HYSTERECTOMY    . CARDIAC CATHETERIZATION  11/2013   [facility]  . [place]   2nd to left toe  . ESOPHAGOGASTRODUODENOSCOPY  10/1999   inflammation only   . FOREIGN BODY REMOVAL Right 01/02/2013   Procedure: FOREIGN BODY REMOVAL RIGHT INDEX FINGER;   Surgeon: Cammie Sickle., MD;  Location: [place];  Service: Orthopedics;  Laterality: Right;  . HAMMER TOE SURGERY  11/15   Dr Milinda Pointer  . MASTECTOMY  1984   left   . MASTECTOMY  1985   right  . PARTIAL HYSTERECTOMY  1966   w/ bladder tack  . ROTATOR CUFF REPAIR  10/09   right   . TOE SURGERY  2001/2002   2nd/3rd,left      Allergies  Allergies  Allergen Reactions  . Amiodarone Other (See Comments)    Chest discomfort  . Other Rash    oramycin    History of Present Illness    84 year old female with the above complex past medical history including paroxysmal atrial fibrillation, tachybradycardia syndrome, PSVT, non-STEMI, nonobstructive CAD, GERD, asthma, and breast cancer.  Atrial fibrillation was initially managed with amiodarone and Eliquis in April 2014.  It was during that admission that she ruled in for non-STEMI and underwent diagnostic catheterization which showed nonobstructive CAD.  Echo showed an EF of 60 to 65%.  She was also noted during that admission to have a 7.2-second postconversion pause.  She was later switched to flecainide and Xarelto which she has been on since.  Echocardiogram in April 2015 continues to show normal LV function with an EF of 60 to 65%, mild LVH, and moderate MR and TR.  September 2018, she underwent event monitoring which  did not show any significant arrhythmias.  She was last seen in cardiology clinic on July 12 of this year, at which time she was doing well and no changes were made to her medical regimen.  She lives by herself locally but her family lives next door and often assist in her care.  Her daughter generally arranges all of her medicines for the week.  Over the past few weeks, Leah Hayden has had intermittent diarrhea.  Over the past few days she has had mild nausea.  She reports good p.o. intake.  This morning, she awoke and says she just felt "sick-ish" and weak.  She was not having any pain or dyspnea.  She just did  not feel "right."  She called her daughter who came over.  A nephew who is also an EMT came over and checked her vital signs.  Her heart rate was in the 40s while her blood pressures were in the 170s-180s.  EMS was called and patient was taken to [facility] ED.  EKG on arrival showed sinus bradycardia at a rate of 55.  Initial lab work showed stable renal function, electrolytes, H&H, and glucose.  Shortly after 9 AM, her family helped to sit her more upright in the stretcher and following this, she went into rapid atrial fibrillation.  Ms. Cronce says that she does not think she was aware that she was tachycardic.  She was not having any chest pain or dyspnea.  She was seen by internal medicine and as she was due for her morning dose of flecainide, she was advised to take a dose in the ER.  She was also given some intravenous magnesium by the ER staff.  Approximately 35 minutes after taking flecainide, she had a 5.9-second pause followed by conversion to sinus bradycardia.  Per pts dtr, she had a blank stare during the pause, however it does not appear that Leah Hayden ever lost consciousness/became hemodynamically unstable.  She has had no recurrence of Afib or prolonged pauses.  She remains bradycardic, currently with rates in the 50's.  She denies chest pain, palpitations, dyspnea, pnd, orthopnea, dizziness, edema, weight gain, or early satiety.   Home Medications    Prior to Admission medications   Medication Sig Start Date End Date Taking? Authorizing Provider  albuterol (PROVENTIL HFA) 108 (90 BASE) MCG/ACT inhaler Inhale 2 puffs into the lungs every 6 (six) hours as needed.     Yes [provider]  cholecalciferol (VITAMIN D) 1000 units tablet Take 5,000 Units by mouth daily.   Yes [provider]  cyanocobalamin 500 MCG tablet Take 500 mcg by mouth every other day.    Yes [provider]  donepezil (ARICEPT) 5 MG tablet TAKE 1 TABLET (5 MG TOTAL) BY MOUTH AT BEDTIME. 02/08/18   Yes Venia Carbon, MD  flecainide (TAMBOCOR) 50 MG tablet TAKE 1 TABLET (50 MG TOTAL) BY MOUTH 2 (TWO) TIMES DAILY. 09/05/17  Yes Gollan, Kathlene November, MD  Fluticasone-Salmeterol (ADVAIR DISKUS) 100-50 MCG/DOSE AEPB Inhale 1 puff into the lungs every 12 (twelve) hours.     Yes [provider]  montelukast (SINGULAIR) 10 MG tablet Take 10 mg by mouth every evening.    Yes [provider]  Multiple Vitamins-Minerals (CENTRUM SILVER 50+WOMEN) TABS Take 1 tablet by mouth daily.   Yes [provider]  nitroGLYCERIN (NITROSTAT) 0.4 MG SL tablet Place 1 tablet (0.4 mg total) under the tongue every 5 (five) minutes as needed for chest pain. 02/22/18  Yes Gollan, Kathlene November, MD  XARELTO 20 MG TABS tablet TAKE 1 TABLET BY MOUTH EVERY DAY WITH SUPPER 03/13/18  Yes Gollan, Kathlene November, MD      Family History    Family History  Problem Relation Age of Onset  . Heart failure Mother   . Gout Mother   . Heart disease Mother        heart failure  . Arthritis Mother   . Heart failure Father   . Heart disease Father        heart failure  . Diabetes Maternal Grandmother   . Colon cancer Maternal Grandfather   . Cancer Maternal Grandfather        colon  . Arthritis Brother    She indicated that her mother is deceased. She indicated that her father is deceased. She indicated that only one of her two brothers is alive. She indicated that her maternal grandmother is deceased. She indicated that her maternal grandfather is deceased.   Social History    Social History   Socioeconomic History  . Marital status: Widowed    Spouse name: Not on file  . Number of children: 2  . Years of education: Not on file  . Highest education level: Not on file  Occupational History  . 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 Financial resource strain: Not on file  . Food insecurity:    Worry: Not on file    Inability: Not on file  . Transportation  needs:    Medical: Not on file    Non-medical: Not on file  Tobacco Use  . Smoking status: Never Smoker  . Smokeless tobacco: Never Used  Substance and Sexual Activity  . Alcohol use: No    Alcohol/week: 0.0 standard drinks  . Drug use: No  . Sexual activity: Not on file  Lifestyle  . Physical activity:    Days per week: Not on file    Minutes per session: Not on file  . Stress: Not on file  Relationships  . Social connections:    Talks on phone: Not on file    Gets together: Not on file    Attends religious service: Not on file    Active member of club or organization: Not on file    Attends meetings of clubs or organizations: Not on file    Relationship status: Not on file  . Intimate partner violence:    Fear of current or ex partner: Not on file    Emotionally abused: Not on file    Physically abused: Not on file    Forced sexual activity: Not on file  Other Topics Concern  . Not on file  Social History Narrative   Now has living will   Sons are health care POAs   Has DNR   No tube feeds if cognitively unaware     Review of Systems    General:  +++ malaise/wkns this AM.  No chills, fever, night sweats or weight changes.  Cardiovascular:  No chest pain, dyspnea on exertion, edema, orthopnea, palpitations, paroxysmal nocturnal dyspnea. Dermatological: No rash, lesions/masses Respiratory: No cough, dyspnea Urologic: No hematuria, dysuria Abdominal:   +++ intermittent nausea and diarrhea over the past 2 wks or so.  No vomiting, bright red blood per rectum, melena, or hematemesis Neurologic:  No visual changes, +++ wkns, changes in mental status. Some increase in confusion/advancing of dementia noted by dtr. All other systems reviewed  and are otherwise negative except as noted above.  Physical Exam    Blood pressure (!) 117/45, pulse (!) 47, temperature 97.6 F (36.4 C), temperature source Oral, resp. rate 16, height 5\' 4"  (1.626 m), weight 56.7 kg, SpO2 99 %.    General: Pleasant, NAD Psych: Normal affect. Neuro: Alert and oriented X 3. Moves all extremities spontaneously. HEENT: Normal  Neck: Supple without JVD.  Soft left bruit vs radiated murmur. Lungs:  Resp regular and unlabored, CTA. Heart: RRR 2/6 syst murmur @ upper sternal borders - radiates to L neck.  No s3, s4. Abdomen: Soft, non-tender, non-distended, BS + x 4.  Extremities: No clubbing, cyanosis or edema. DP/PT/Radials 2+ and equal bilaterally.  Labs     Recent Labs    04/12/18 1119  TROPONINI <0.03   Lab Results  Component Value Date   WBC 5.0 04/12/2018   HGB 13.6 04/12/2018   HCT 39.4 04/12/2018   MCV 92.5 04/12/2018   PLT 169 04/12/2018    Recent Labs  Lab 04/12/18 0738  NA 141  K 4.0  CL 102  CO2 31  BUN 21  CREATININE 0.85  CALCIUM 9.5  PROT 6.5  BILITOT 1.2  ALKPHOS 61  ALT 21  AST 23  GLUCOSE 102*   Lab Results  Component Value Date   CHOL 196 06/14/2016   HDL 65.30 06/14/2016   LDLCALC 115 (H) 06/14/2016   TRIG 80.0 06/14/2016    Radiology Studies    No results found.  ECG & Cardiac Imaging    Initial ECG showed sinus bradycardia, 55, leftward axis with old anteroseptal infarct.  She subsequently went into atrial fibrillation at 150 bpm with an IVCD.  Assessment & Plan    1.  Paroxysmal atrial fibrillation with posttermination pause/tachybradycardia syndrome: Patient with a prior history of paroxysmal atrial fibrillation with 7.2-second pause in April 2014.  Following work-up at that time, she was initially placed on amiodarone and subsequently flecainide with a plan to prevent A. fib therefore preventing posttermination pauses.  She has been on flecainide and Xarelto for some time now.  She says that she might occasionally miss a dose of either of those 2 medicines but her daughter says that she definitely did not miss any doses recently as she has been feeling her medicine box and all of the pills have been taken.  She wore an event  monitor in September 2018 which did not show any significant arrhythmias.  This morning, she presented to the emergency department secondary to feeling somewhat weak and sick-ish.  She was found to be hypertensive and bradycardic at home with heart rates in the 40s.  While in the emergency department, she developed rapid atrial fibrillation and subsequently took a flecainide 50 mg tablet with subsequent conversion to sinus rhythm following a 5.9-second pause, likely 2/2 increased atrial and avn refractory periods following flecainide.  Since then, she has remained bradycardic initially with rates in the 40s now in the 50s.  She is hemodynamically stable.  Flecainide does not generally impact sinus rate.  Recommend observation on telemetry tonight to reevaluate for recurrent heart block, A. fib, or posttermination pauses.  We would need to consider EP referral for PPM if she is noted to have recurrence of the latter.  If not, would plan on repeat 30 day event monitoring.  Signed, Murray Hodgkins, NP 04/12/2018, 4:11 PM  For questions or updates, please contact   Please consult [email or web address] for contact info under Cardiology/STEMI.

## 2018-04-12 NOTE — ED Notes (Signed)
EDP has captured episode and current EKG showing NSR.

## 2018-04-12 NOTE — Consult Note (Signed)
Name: Leah Hayden MRN: [id number] DOB: 09/21/1927     CONSULTATION DATE: 04/12/2018     HISTORY OF PRESENT ILLNESS:   84 years old lady with history of paroxysmal atrial fibrillation on anticoagulation, tachybradycardia syndrome, nonobstructing coronary artery disease, GERD, colonic polyps, osteoarthritis, breast CA, and Mnire's disease.  Patient presented to ED with 2-week history of generalized weakness, was found to be in A. fib with RVR, received Cardizem followed by episodes of bradycardia concerning for heart block. Patient was evaluated by cardiology, was found to have postconversion pause. All history was obtained from Dr. Jerelyn Charles admitting physicians and EMR. Patient arrived to the ice unit awake on nasal cannula, in no distress and no chest pain.  PAST MEDICAL HISTORY :   has a past medical history of Allergic rhinitis, Asthma, Biceps tendon tear, Diverticulosis of colon, GERD (gastroesophageal reflux disease), Hammer toe of right foot, colonic polyps, breast cancer, Meniere's disease, NSTEMI (non-ST elevated myocardial infarction) ([place]), Osteoarthritis, PAF (paroxysmal atrial fibrillation) ([place]), and Sinus brady-tachy syndrome ([place]).  has a past surgical history that includes Mastectomy (1984); Mastectomy (1985); Partial hysterectomy (1966); Correction hammer toe (1997); Esophagogastroduodenoscopy (10/1999); Toe Surgery (2001/2002); Rotator cuff repair (10/09); Abdominal hysterectomy; Foreign Body Removal (Right, 01/02/2013); Cardiac catheterization (11/2013); and Hammer toe surgery (11/15). Prior to Admission medications   Medication Sig Start Date End Date Taking? Authorizing Provider  albuterol (PROVENTIL HFA) 108 (90 BASE) MCG/ACT inhaler Inhale 2 puffs into the lungs every 6 (six) hours as needed.     Yes [provider]  cholecalciferol (VITAMIN D) 1000 units tablet Take 5,000 Units by mouth daily.   Yes [provider]  cyanocobalamin 500 MCG tablet Take  500 mcg by mouth every other day.    Yes [provider]  donepezil (ARICEPT) 5 MG tablet TAKE 1 TABLET (5 MG TOTAL) BY MOUTH AT BEDTIME. 02/08/18  Yes Venia Carbon, MD  flecainide (TAMBOCOR) 50 MG tablet TAKE 1 TABLET (50 MG TOTAL) BY MOUTH 2 (TWO) TIMES DAILY. 09/05/17  Yes Gollan, Kathlene November, MD  Fluticasone-Salmeterol (ADVAIR DISKUS) 100-50 MCG/DOSE AEPB Inhale 1 puff into the lungs every 12 (twelve) hours.     Yes [provider]  montelukast (SINGULAIR) 10 MG tablet Take 10 mg by mouth every evening.    Yes [provider]  Multiple Vitamins-Minerals (CENTRUM SILVER 50+WOMEN) TABS Take 1 tablet by mouth daily.   Yes [provider]  nitroGLYCERIN (NITROSTAT) 0.4 MG SL tablet Place 1 tablet (0.4 mg total) under the tongue every 5 (five) minutes as needed for chest pain. 02/22/18  Yes Gollan, Kathlene November, MD  XARELTO 20 MG TABS tablet TAKE 1 TABLET BY MOUTH EVERY DAY WITH SUPPER 03/13/18  Yes Gollan, Kathlene November, MD   Allergies  Allergen Reactions  . Amiodarone Other (See Comments)    Chest discomfort  . Other Rash    oramycin    FAMILY HISTORY:  family history includes Arthritis in her brother and mother; Cancer in her maternal grandfather; Colon cancer in her maternal grandfather; Diabetes in her maternal grandmother; Gout in her mother; Heart disease in her father and mother; Heart failure in her father and mother. SOCIAL HISTORY:  reports that she has never smoked. She has never used smokeless tobacco. She reports that she does not drink alcohol or use drugs.  REVIEW OF SYSTEMS:   Unable to obtain due to critical illness   VITAL SIGNS: Temp:  [97.6 F (36.4 C)-98.5 F (36.9 C)] 98.5 F (36.9 C) (  08/30 1650) Pulse Rate:  [45-128] 50 (08/30 1700) Resp:  [12-23] 15 (08/30 1700) BP: (111-202)/(43-110) 137/82 (08/30 1700) SpO2:  [99 %-100 %] 100 % (08/30 1700) Weight:  [56.1 kg-56.7 kg] 56.1 kg (08/30 1650)  Physical Examination:  Awake and  oriented with nonfocal neurological exam Tolerating nasal cannula, no distress, able to talk in full sentences, bilateral equal air entry and no adventitious sounds S1 & S2 are audible with no murmur Benign abdominal exam with normal peristalsis Wasted extremities with no edema   ASSESSMENT / PLAN: *Atrial fibrillation with RVR and post conversion pause. History of paroxysmal A. fib on flecainide and Xarelto. Tachybradycardia syndrome. Nonobstructive CAD and non-STEMI 11/2012. -Monitor as per cardiology.  Plan for monitoring, considering EP referral for placement of PPM  *Asymptomatic UTI -No need to treat.  Considering DC Rocephin  *Asthma on Singulair  DNR  Supportive care  Critical care time 45 minutes

## 2018-04-12 NOTE — ED Notes (Signed)
Pt had a rate change pt went to AFIB. EDP at bedside. RN will monitor

## 2018-04-12 NOTE — ED Notes (Addendum)
.  Pt placed on bedpan.  Pt received peri care at this time.  Pt brief was changed and pt was repositioned.  RN will monitor.

## 2018-04-12 NOTE — H&P (Signed)
[name] [place] NAME: Leah Hayden    MR#:  [id number]  DATE OF BIRTH:  1928/05/06  DATE OF ADMISSION:  04/12/2018  PRIMARY CARE PHYSICIAN: Venia Carbon, MD   REQUESTING/REFERRING PHYSICIAN:   CHIEF COMPLAINT:   Chief Complaint  Patient presents with  . Emesis  . Weakness    HISTORY OF PRESENT ILLNESS: Leah Hayden  is a 84 y.o. female with a known history per below which includes tachybradycardia syndrome with sinus pause, resenting to the emergency room with 2-week history of generalized weakness/fatigue, during this time patient also had intermittent episodes of diarrhea, in the emergency room patient was found to have A. fib with RVR with heart rate in the 140s-patient was treated with IV diltiazem, while in the emergency room patient had several episodes of sinus pauses with bradycardia with heart rate in the 20s/30s, ER work-up noted for questionable UTI, case was discussed between ED attending and Dr. Gollan/cardiology-recommended outpatient follow-up versus inpatient evaluation over the weekend, family elects to stay in the hospital for further evaluation given continued episodes of severe bradycardia, per ED attending-cardiology believes patient may not be compliant with flecainide as directed at home, patient evaluated at the bedside, daughter-in-law/2 sons at the bedside, patient denies any pain, shortness of breath, patient is now being admitted for acute recurrent tachybradycardia syndrome, acute probable UTI, patient wishes to be DNR going forward.  PAST MEDICAL HISTORY:   Past Medical History:  Diagnosis Date  . Allergic rhinitis   . Asthma   . Biceps tendon tear    a. right->s/p surgical repair winter of 2014.  . Diverticulosis of colon   . GERD (gastroesophageal reflux disease)   . Hammer toe of right foot   . Hx of colonic polyps   . HX: breast cancer   . Meniere's disease   . NSTEMI (non-ST elevated myocardial  infarction) ([place])    a. 11/2012 in setting of rapid afib;  b. 11/2012 Cath: nl EF, nonobs CAD->Med Rx;  b. 11/2012 Echo: EF 60-65%, mild LVH, mild MR/TR.  . Osteoarthritis   . PAF (paroxysmal atrial fibrillation) ([place])    a. Eliquis and amio initiated 11/2012 in setting of admission with RVR and NSTEMI;  b. Subsequently changed to flecainide and xarelto;  c. 11/2013 Echo: EF 60-65%, mild LVH, mold MR/TR.  Marland Kitchen Sinus brady-tachy syndrome ([place])    a. Post-conversion pause of 7.2 seconds during 11/2012 hospitalization @ [facility].    PAST SURGICAL HISTORY:  Past Surgical History:  Procedure Laterality Date  . ABDOMINAL HYSTERECTOMY    . CARDIAC CATHETERIZATION  11/2013   [facility]  . [place]   2nd to left toe  . ESOPHAGOGASTRODUODENOSCOPY  10/1999   inflammation only   . FOREIGN BODY REMOVAL Right 01/02/2013   Procedure: FOREIGN BODY REMOVAL RIGHT INDEX FINGER;  Surgeon: Cammie Sickle., MD;  Location: [facility];  Service: Orthopedics;  Laterality: Right;  . HAMMER TOE SURGERY  11/15   Dr Milinda Pointer  . MASTECTOMY  1984   left   . MASTECTOMY  1985   right  . PARTIAL HYSTERECTOMY  1966   w/ bladder tack  . ROTATOR CUFF REPAIR  10/09   right   . TOE SURGERY  2001/2002   2nd/3rd,left     SOCIAL HISTORY:  Social History   Tobacco Use  . Smoking status: Never Smoker  . Smokeless tobacco: Never Used  Substance Use Topics  .  Alcohol use: No    Alcohol/week: 0.0 standard drinks    FAMILY HISTORY:  Family History  Problem Relation Age of Onset  . Heart failure Mother   . Gout Mother   . Heart disease Mother        heart failure  . Arthritis Mother   . Heart failure Father   . Heart disease Father        heart failure  . Diabetes Maternal Grandmother   . Colon cancer Maternal Grandfather   . Cancer Maternal Grandfather        colon  . Arthritis Brother     DRUG ALLERGIES:  Allergies  Allergen Reactions  . Amiodarone Other (See Comments)    Chest  discomfort  . Other Rash    oramycin    REVIEW OF SYSTEMS:   CONSTITUTIONAL: No fever,+ fatigue, weakness.  EYES: No blurred or double vision.  EARS, NOSE, AND THROAT: No tinnitus or ear pain.  RESPIRATORY: No cough, shortness of breath, wheezing or hemoptysis.  CARDIOVASCULAR: No chest pain, orthopnea, edema.  GASTROINTESTINAL: No nausea, vomiting, diarrhea or abdominal pain.  GENITOURINARY: No dysuria, hematuria.  ENDOCRINE: No polyuria, nocturia,  HEMATOLOGY: No anemia, easy bruising or bleeding SKIN: No rash or lesion. MUSCULOSKELETAL: No joint pain or arthritis.   NEUROLOGIC: No tingling, numbness, weakness.  PSYCHIATRY: No anxiety or depression.   MEDICATIONS AT HOME:  Prior to Admission medications   Medication Sig Start Date End Date Taking? Authorizing Provider  albuterol (PROVENTIL HFA) 108 (90 BASE) MCG/ACT inhaler Inhale 2 puffs into the lungs every 6 (six) hours as needed.     Yes [provider]  cholecalciferol (VITAMIN D) 1000 units tablet Take 5,000 Units by mouth daily.   Yes [provider]  cyanocobalamin 500 MCG tablet Take 500 mcg by mouth every other day.    Yes [provider]  donepezil (ARICEPT) 5 MG tablet TAKE 1 TABLET (5 MG TOTAL) BY MOUTH AT BEDTIME. 02/08/18  Yes Venia Carbon, MD  flecainide (TAMBOCOR) 50 MG tablet TAKE 1 TABLET (50 MG TOTAL) BY MOUTH 2 (TWO) TIMES DAILY. 09/05/17  Yes Gollan, Kathlene November, MD  Fluticasone-Salmeterol (ADVAIR DISKUS) 100-50 MCG/DOSE AEPB Inhale 1 puff into the lungs every 12 (twelve) hours.     Yes [provider]  montelukast (SINGULAIR) 10 MG tablet Take 10 mg by mouth every evening.    Yes [provider]  Multiple Vitamins-Minerals (CENTRUM SILVER 50+WOMEN) TABS Take 1 tablet by mouth daily.   Yes [provider]  nitroGLYCERIN (NITROSTAT) 0.4 MG SL tablet Place 1 tablet (0.4 mg total) under the tongue every 5 (five) minutes as needed for chest pain. 02/22/18  Yes  Gollan, Kathlene November, MD  XARELTO 20 MG TABS tablet TAKE 1 TABLET BY MOUTH EVERY DAY WITH SUPPER 03/13/18  Yes Gollan, Kathlene November, MD      PHYSICAL EXAMINATION:   VITAL SIGNS: Blood pressure (!) 137/56, pulse 64, temperature 97.6 F (36.4 C), temperature source Oral, resp. rate 19, height 5\' 4"  (1.626 m), weight 56.7 kg, SpO2 100 %.  GENERAL:  84 y.o.-year-old patient lying in the bed with no acute distress.  Frail-appearing EYES: Pupils equal, round, reactive to light and accommodation. No scleral icterus. Extraocular muscles intact.  HEENT: Head atraumatic, normocephalic. Oropharynx and nasopharynx clear.  NECK:  Supple, no jugular venous distention. No thyroid enlargement, no tenderness.  LUNGS: Normal breath sounds bilaterally, no wheezing, rales,rhonchi or crepitation. No use of accessory muscles of respiration.  CARDIOVASCULAR: S1, S2 normal. No murmurs, rubs, or gallops.  ABDOMEN: Soft, nontender, nondistended. Bowel sounds present. No organomegaly or mass.  EXTREMITIES: No pedal edema, cyanosis, or clubbing.  Diffuse muscular atrophy NEUROLOGIC: Cranial nerves II through XII are intact. MAES. Gait not checked.  PSYCHIATRIC: The patient is alert and oriented x 3.  SKIN: No obvious rash, lesion, or ulcer.   LABORATORY PANEL:   CBC Recent Labs  Lab 04/12/18 0738  WBC 5.0  HGB 13.6  HCT 39.4  PLT 169  MCV 92.5  MCH 32.1  MCHC 34.7  RDW 13.2  LYMPHSABS 1.4  MONOABS 0.4  EOSABS 0.1  BASOSABS 0.0   ------------------------------------------------------------------------------------------------------------------  Chemistries  Recent Labs  Lab 04/12/18 0738  NA 141  K 4.0  CL 102  CO2 31  GLUCOSE 102*  BUN 21  CREATININE 0.85  CALCIUM 9.5  AST 23  ALT 21  ALKPHOS 61  BILITOT 1.2   ------------------------------------------------------------------------------------------------------------------ estimated creatinine clearance is 39.5 mL/min (by C-G formula based  on SCr of 0.85 mg/dL). ------------------------------------------------------------------------------------------------------------------ No results for input(s): TSH, T4TOTAL, T3FREE, THYROIDAB in the last 72 hours.  Invalid input(s): FREET3   Coagulation profile No results for input(s): INR, PROTIME in the last 168 hours. ------------------------------------------------------------------------------------------------------------------- No results for input(s): DDIMER in the last 72 hours. -------------------------------------------------------------------------------------------------------------------  Cardiac Enzymes Recent Labs  Lab 04/12/18 1119  TROPONINI <0.03   ------------------------------------------------------------------------------------------------------------------ Invalid input(s): POCBNP  ---------------------------------------------------------------------------------------------------------------  Urinalysis    Component Value Date/Time   COLORURINE STRAW (A) 04/12/2018 0738   APPEARANCEUR CLEAR (A) 04/12/2018 0738   APPEARANCEUR Clear 11/27/2013 1233   LABSPEC 1.003 (L) 04/12/2018 0738   LABSPEC 1.016 11/27/2013 1233   PHURINE 7.0 04/12/2018 0738   GLUCOSEU NEGATIVE 04/12/2018 0738   GLUCOSEU Negative 11/27/2013 1233   HGBUR MODERATE (A) 04/12/2018 0738   HGBUR moderate 10/15/2009 1440   BILIRUBINUR NEGATIVE 04/12/2018 0738   BILIRUBINUR negative 02/21/2017 1120   BILIRUBINUR Negative 11/27/2013 1233   KETONESUR NEGATIVE 04/12/2018 0738   PROTEINUR NEGATIVE 04/12/2018 0738   UROBILINOGEN 0.2 02/21/2017 1120   UROBILINOGEN 0.2 10/15/2009 1440   NITRITE NEGATIVE 04/12/2018 0738   LEUKOCYTESUR TRACE (A) 04/12/2018 0738   LEUKOCYTESUR Trace 11/27/2013 1233     RADIOLOGY: No results found.  EKG: Orders placed or performed during the hospital encounter of 04/12/18  . EKG 12-Lead  . EKG 12-Lead  . ED EKG  . ED EKG  . EKG 12-Lead  . EKG  12-Lead  . EKG 12-Lead  . EKG 12-Lead  . EKG 12-Lead  . EKG 12-Lead    IMPRESSION AND PLAN: *Acute recurrent tachybradycardia syndrome Initial heart rate was in the 140s, with treatment/IV Cardizem patient's heart rate dropped into the 20s-30s with sinus pauses noted Admit to stepdown unit, case discussed with intensivist, Dr. Rockey Situ consulted for continuity of care as patient may require pacemaker placement, rule out acute coronary syndrome with cardiac enzymes x3 sets, continue flecainide, check echocardiogram, TSH, currently rate controlled on no AV nodal blocking agents, on Xarelto, continue flecainide, supplemental oxygen PRN, and continue close medical monitoring  *History of paroxysmal A. Fib On Xarelto, all other plans as stated above  *History of asthma without exacerbation Stable Continue Advair, breathing treatments as needed  *Chronic GERD without esophagitis Stable PPI daily  *History of dementia Stable Increase nursing care PRN, aspiration/fall/skin care precautions while in house, continue Aricept  All the records are reviewed and case discussed with ED provider. Management plans discussed with the patient, family and they are  in agreement.  CODE STATUS:dnr Advance Directive Documentation     Most Recent Value  Type of Advance Directive  Living will  Pre-existing out of facility DNR order (yellow form or pink MOST form)  -  "MOST" Form in Place?  -       TOTAL TIME TAKING CARE OF THIS PATIENT: 45 minutes.    Avel Peace Salary M.D on 04/12/2018   Between 7am to 6pm - Pager - [phone]  After 6pm go to [email or web address] - password [email or web address] [facility]  Office  [phone]  CC: Primary care physician; Venia Carbon, MD   Note: This dictation was prepared with Dragon dictation along with smaller phrase technology. Any transcriptional errors that result from this process are unintentional.

## 2018-04-12 NOTE — Progress Notes (Signed)
Family Meeting Note  Advance Directive:yes  Today a meeting took place with the Patient.  Patient is able to participate   The following clinical team members were present during this meeting:MD  The following were discussed:Patient's diagnosis: Acute recurrent tachybradycardia syndrome, asthma, diverticulosis, GERD, MI, paroxysmal A. fib, sinus pauses, Patient's progosis: Unable to determine and Goals for treatment: DNR  Additional follow-up to be provided: prn  Time spent during discussion:20 minutes  Gorden Harms, MD

## 2018-04-12 NOTE — ED Notes (Signed)
1019  Pt had afib on the monitor then 5 sec run of asystole then went to NSR. EDP at bedside.

## 2018-04-13 DIAGNOSIS — R001 Bradycardia, unspecified: Secondary | ICD-10-CM

## 2018-04-13 LAB — BASIC METABOLIC PANEL
Anion gap: 5 (ref 5–15)
BUN: 22 mg/dL (ref 8–23)
CO2: 29 mmol/L (ref 22–32)
Calcium: 8.8 mg/dL — ABNORMAL LOW (ref 8.9–10.3)
Chloride: 106 mmol/L (ref 98–111)
Creatinine, Ser: 0.89 mg/dL (ref 0.44–1.00)
GFR calc Af Amer: >60 mL/min (ref >60)
GFR calc non Af Amer: 56 mL/min — ABNORMAL LOW (ref >60)
Glucose, Bld: 103 mg/dL — ABNORMAL HIGH (ref 70–99)
Potassium: 4.1 mmol/L (ref 3.5–5.1)
Sodium: 140 mmol/L (ref 135–145)

## 2018-04-13 LAB — MAGNESIUM: Magnesium: 2.2 mg/dL (ref 1.7–2.4)

## 2018-04-13 LAB — ECHOCARDIOGRAM COMPLETE
Height: 65 in
Weight: 1978.85 oz

## 2018-04-13 LAB — PHOSPHORUS: Phosphorus: 3.8 mg/dL (ref 2.5–4.6)

## 2018-04-13 LAB — TROPONIN I: Troponin I: 0.04 ng/mL (ref <0.03)

## 2018-04-13 NOTE — Progress Notes (Signed)
Patient alert, no complaints of chest pain or shortness of breath. Tolerating diet and using bedpan. Dr Rockey Situ assessed patient in the am. Placed orders for transfer. Parameters entered per Dr Rockey Situ. Daughter called with new room number. Patient being transferred.

## 2018-04-13 NOTE — Progress Notes (Signed)
[facility] [place] NAME: Leah Hayden    MR#:  [id number]  DATE OF BIRTH:  Jun 17, 1928  SUBJECTIVE:  CHIEF COMPLAINT:   Chief Complaint  Patient presents with  . Emesis  . Weakness  Patient seen and evaluated today No chest pain No palpitations No new episodes of nausea and vomiting  REVIEW OF SYSTEMS:    ROS  CONSTITUTIONAL: No documented fever. Has fatigue, weakness. No weight gain, no weight loss.  EYES: No blurry or double vision.  ENT: No tinnitus. No postnasal drip. No redness of the oropharynx.  RESPIRATORY: No cough, no wheeze, no hemoptysis. No dyspnea.  CARDIOVASCULAR: No chest pain. No orthopnea. No palpitations. No syncope.  GASTROINTESTINAL: No nausea, no vomiting or diarrhea. No abdominal pain. No melena or hematochezia.  GENITOURINARY: No dysuria or hematuria.  ENDOCRINE: No polyuria or nocturia. No heat or cold intolerance.  HEMATOLOGY: No anemia. No bruising. No bleeding.  INTEGUMENTARY: No rashes. No lesions.  MUSCULOSKELETAL: No arthritis. No swelling. No gout.  NEUROLOGIC: No numbness, tingling, or ataxia. No seizure-type activity.  PSYCHIATRIC: No anxiety. No insomnia. No ADD.   DRUG ALLERGIES:   Allergies  Allergen Reactions  . Amiodarone Other (See Comments)    Chest discomfort  . Other Rash    oramycin    VITALS:  Blood pressure 123/62, pulse (!) 57, temperature (!) 97.5 F (36.4 C), resp. rate 19, height 5\' 5"  (1.651 m), weight 56.1 kg, SpO2 100 %.  PHYSICAL EXAMINATION:   Physical Exam  GENERAL:  84 y.o.-year-old patient lying in the bed with no acute distress.  EYES: Pupils equal, round, reactive to light and accommodation. No scleral icterus. Extraocular muscles intact.  HEENT: Head atraumatic, normocephalic. Oropharynx and nasopharynx clear.  NECK:  Supple, no jugular venous distention. No thyroid enlargement, no tenderness.  LUNGS: Normal breath sounds bilaterally, no wheezing, rales,  rhonchi. No use of accessory muscles of respiration.  CARDIOVASCULAR: S1, S2 bradycardia present. No murmurs, rubs, or gallops.  ABDOMEN: Soft, nontender, nondistended. Bowel sounds present. No organomegaly or mass.  EXTREMITIES: No cyanosis, clubbing or edema b/l.    NEUROLOGIC: Cranial nerves II through XII are intact. No focal Motor or sensory deficits b/l.   Hard of hearing Has dementia PSYCHIATRIC: The patient is alert and oriented x 3.  SKIN: No obvious rash, lesion, or ulcer.   LABORATORY PANEL:   CBC Recent Labs  Lab 04/12/18 0738  WBC 5.0  HGB 13.6  HCT 39.4  PLT 169   ------------------------------------------------------------------------------------------------------------------ Chemistries  Recent Labs  Lab 04/12/18 0738 04/13/18 0434  NA 141 140  K 4.0 4.1  CL 102 106  CO2 31 29  GLUCOSE 102* 103*  BUN 21 22  CREATININE 0.85 0.89  CALCIUM 9.5 8.8*  MG  --  2.2  AST 23  --   ALT 21  --   ALKPHOS 61  --   BILITOT 1.2  --    ------------------------------------------------------------------------------------------------------------------  Cardiac Enzymes Recent Labs  Lab 04/13/18 0434  TROPONINI 0.04*   ------------------------------------------------------------------------------------------------------------------  RADIOLOGY:  No results found.   ASSESSMENT AND PLAN:  84 year old elderly female patient with history of tachybradycardia syndrome, GERD, breast cancer, myocardial infarction, Mnire's disease, proximal atrial fibrillation currently on anticoagulation with Xarelto under hospitalist service for recurrent tachycardia bradycardia syndrome  -Acute recurrent tachybradycardia syndrome cardiology follow-up Flecainide on hold Anticoagulation with Xarelto  -History of proximal atrial fibrillation Continue anticoagulations Xarelto  -Bronchial asthma stable Continue home dose inhalers  -  Chronic GERD Oral PPI  -Dementia Supportive  care May need home health services and physical therapy services at disposition   All the records are reviewed and case discussed with Care Management/Social Worker. Management plans discussed with the patient, family and they are in agreement.  CODE STATUS: DNR  DVT Prophylaxis: SCDs  TOTAL TIME TAKING CARE OF THIS PATIENT: 35 minutes.   POSSIBLE D/C IN 2 to 3 DAYS, DEPENDING ON CLINICAL CONDITION.  Saundra Shelling M.D on 04/13/2018 at 1:07 PM  Between 7am to 6pm - Pager - [phone]  After 6pm go to [email or web address] - password [email or web address] [facility]  Office  [phone]  CC: Primary care physician; Venia Carbon, MD  Note: This dictation was prepared with Dragon dictation along with smaller phrase technology. Any transcriptional errors that result from this process are unintentional.

## 2018-04-13 NOTE — Progress Notes (Signed)
Progress Note  Patient Name: Leah Hayden Date of Encounter: 04/13/2018  Primary Cardiologist: Leah Rogue, MD   Subjective   No significant events overnight On review of telemetry Heart rate improved into the 50s and a stable manner Rare episodes heart rate down into the 30s with quick recovery Blood pressure stable No atrial fibrillation Ate a good breakfast  Inpatient Medications    Scheduled Meds: . cholecalciferol  5,000 Units Oral Daily  . donepezil  5 mg Oral QHS  . mometasone-formoterol  2 puff Inhalation BID  . montelukast  10 mg Oral QPM  . multivitamin with minerals  1 tablet Oral Daily  . Rivaroxaban  15 mg Oral Q supper  . sodium chloride flush  3 mL Intravenous Q12H  . vitamin B-12  500 mcg Oral QODAY   Continuous Infusions: . sodium chloride    . sodium chloride 50 mL/hr at 04/13/18 0600  . cefTRIAXone (ROCEPHIN)  IV Stopped (04/12/18 1751)   PRN Meds: sodium chloride, acetaminophen **OR** acetaminophen, albuterol, HYDROcodone-acetaminophen, nitroGLYCERIN, ondansetron **OR** ondansetron (ZOFRAN) IV, polyethylene glycol, sodium chloride flush   Vital Signs    Vitals:   04/13/18 1000 04/13/18 1100 04/13/18 1200 04/13/18 1300  BP: 123/62 (!) 161/64 (!) 149/56 (!) 133/55  Pulse: (!) 57 (!) 53 (!) 54 (!) 56  Resp: 19 20 20  (!) 22  Temp:      TempSrc:      SpO2: 100% 100% 100% 100%  Weight:      Height:        Intake/Output Summary (Last 24 hours) at 04/13/2018 1616 Last data filed at 04/13/2018 1008 Gross per 24 hour  Intake 654.33 ml  Output 1700 ml  Net -1045.67 ml   Filed Weights   04/12/18 0653 04/12/18 1650  Weight: 56.7 kg 56.1 kg    Telemetry    Sinus bradycardia - Personally Reviewed  ECG      Physical Exam   GEN: No acute distress.   Neck: No JVD Cardiac: RRR, no murmurs, rubs, or gallops.  Respiratory: Clear to auscultation bilaterally. GI: Soft, nontender, non-distended  MS: No edema; No deformity. Neuro:   Nonfocal  Psych: Normal affect   Labs    Chemistry Recent Labs  Lab 04/12/18 0738 04/13/18 0434  NA 141 140  K 4.0 4.1  CL 102 106  CO2 31 29  GLUCOSE 102* 103*  BUN 21 22  CREATININE 0.85 0.89  CALCIUM 9.5 8.8*  PROT 6.5  --   ALBUMIN 3.9  --   AST 23  --   ALT 21  --   ALKPHOS 61  --   BILITOT 1.2  --   GFRNONAA 59* 56*  GFRAA >60 >60  ANIONGAP 8 5     Hematology Recent Labs  Lab 04/12/18 0738  WBC 5.0  RBC 4.25  HGB 13.6  HCT 39.4  MCV 92.5  MCH 32.1  MCHC 34.7  RDW 13.2  PLT 169    Cardiac Enzymes Recent Labs  Lab 04/12/18 1119 04/12/18 1703 04/12/18 2222 04/13/18 0434  TROPONINI <0.03 0.06* 0.05* 0.04*   No results for input(s): TROPIPOC in the last 168 hours.   BNPNo results for input(s): BNP, PROBNP in the last 168 hours.   DDimer No results for input(s): DDIMER in the last 168 hours.   Radiology    No results found.  Cardiac Studies     Patient Profile      Ms Leah Hayden is a very pleasant 84 year old woman  with a history of  SVT, pauses on Holter,  admitted to the hospital August 2012 with atrial fibrillation  converted shortly after to normal sinus rhythm At least one additional episode of atrial fibrillation since then converted from afib to sinus, she had a 7.2 second pause, she was asymptomatic. She was started on antiarrhythmic therapy in order to prevent recurrent afib and subsequently recurrent post-termination pauses. Eliquis was also started.  Cardiac catheterization in 2015 showing no significant CAD Progressive  dementia Presenting to the hospital with weakness and general malaise  Assessment & Plan    A/P: Paroxysmal atrial fibrillation Heart rate in the 40s on arrival Flecainide held with improvement of rate up into the 50s She has been relatively asymptomatic Blood pressure stable to elevated -if she has episodes of paroxysmal atrial fibrillation, would treat with diltiazem 30 mg pills -Unclear if she would  tolerate chronic atrial fibrillation with rate control For frequent episodes of paroxysmal atrial fibrillation and conversion pauses might even need a pacemaker -Challenging to start alternate antiarrhythmic given her bradycardia  Dementia Likely predominant reason for her admission Patient trying to live independently but having difficulty  progressive over the past year Essentially is housebound and should not be driving Unable to go get her groceries and appears to be losing weight Needs case manager consult, possibly home social worker visit Home PT, Meals on Wheels  Long discussion with family at the bedside concerning the above Discussed extensively with nursing  Total encounter time more than 35 minutes  Greater than 50% was spent in counseling and coordination of care with the patient    For questions or updates, please contact [facility] Please consult [email or web address] for contact info under Cardiology/STEMI.      Signed, Leah Rogue, MD  04/13/2018, 4:16 PM

## 2018-04-14 LAB — URINE CULTURE
Culture: >=100000 — AB
Culture: <10000 — AB

## 2018-04-14 MED ORDER — LOSARTAN POTASSIUM 50 MG PO TABS
50.0000 mg | ORAL_TABLET | Freq: Every day | ORAL | Status: DC
  Administered 2018-04-14 – 2018-04-15 (×2): 50 mg via ORAL
  Filled 2018-04-14 (×2): qty 1

## 2018-04-14 NOTE — Progress Notes (Signed)
Patient resting in the bed, alert , denies any pain, family at bedside, ambu;ate with PT as well as we staff  Using a walker, bo distress noted

## 2018-04-14 NOTE — Evaluation (Signed)
Physical Therapy Evaluation Patient Details Name: Leah Hayden MRN: [id number] DOB: [birth year] Today's Date: 04/14/2018   History of Present Illness  Pt is an 84 y.o. female presenting to hospital 04/12/18 with weakness x2 weeks and emesis.  Pt noted to be in a-fib with RVR with HR in 140's in ED and then pt with several episodes sinus pauses with bradycardia with HR 20's/30's.  Pt admitted with acute recurrent tachybradycardia syndrome and asymptomatic UTI.  PMH includes a-fib on Eliquis, asthma, CAD, diverticulitis, h/o breast CA, NSTEMI, Meniere's disease, PAF, sinus bradytachy syndrome, B mastectomy, R RCR, and dementia.  Clinical Impression  Prior to hospital admission, pt was modified independent with functional mobility (alternated between no AD vs cane vs walking stick depending on how she was feeling and what she was doing).  Pt lives alone on main level of home (reports someone can assist with laundry in basement); 2 steps to enter with R railing.  Currently pt is modified independent with bed mobility, CGA with transfers, and CGA ambulating around nursing loop with RW.  Trialed pt with no AD but pt with narrow BOS and unsteady with ambulation; pt appeared much safer using RW (no loss of balance with mobility using RW during session).  Pt's HR 58 bpm-76 bpm during session's activities.  No c/o pain.  Pt did demonstrate some confusion during session (per chart pt with h/o dementia).  Pt would benefit from skilled PT to address noted impairments and functional limitations (see below for any additional details).  Anticipate pt would do best in her own familiar setting.  Upon hospital discharge, recommend pt discharge with HHPT, use of RW, and intermittent supervision for safety.    Follow Up Recommendations Home health PT;Supervision - Intermittent    Equipment Recommendations  Rolling walker with 5" wheels    Recommendations for Other Services OT consult     Precautions / Restrictions  Precautions Precautions: Fall Restrictions Weight Bearing Restrictions: No      Mobility  Bed Mobility Overal bed mobility: Modified Independent             General bed mobility comments: Supine to/from sit with mild increased effort on own.  Transfers Overall transfer level: Needs assistance Equipment used: Rolling walker (2 wheeled) Transfers: Sit to/from Stand Sit to Stand: Min guard         General transfer comment: increased effort to stand on own but steady with RW use  Ambulation/Gait Ambulation/Gait assistance: Min guard Gait Distance (Feet): 200 Feet Assistive device: Rolling walker (2 wheeled)   Gait velocity: decreased   General Gait Details: decreased BOS; trialed no AD x20 feet but pt demonstrating narrow BOS and unsteady (min assist) so utilized RW for safety; steady with RW use (CGA)  Stairs            Wheelchair Mobility    Modified Rankin (Stroke Patients Only)       Balance Overall balance assessment: Needs assistance Sitting-balance support: No upper extremity supported;Feet supported Sitting balance-[name]: Normal Sitting balance - Comments: steady sitting reaching outside BOS   Standing balance support: No upper extremity supported Standing balance-[name]: Good Standing balance comment: steady static standing and reaching within BOS                             Pertinent Vitals/Pain Pain Assessment: No/denies pain  O2 sats WFL during session on room air    Home Living Family/patient expects to  be discharged to:: Private residence Living Arrangements: Alone Available Help at Discharge: Family;Available PRN/intermittently Type of Home: House Home Access: Stairs to enter Entrance Stairs-Rails: Right Entrance Stairs-Number of Steps: 2 Home Layout: One level;Laundry or work area in [place]: Environmental consultant - 4 wheels;Cane - single point;Other (comment);Shower seat;Bedside commode(walking stick;  unsure if has RW or SW)      Prior Function Level of Independence: Independent         Comments: (+) driving; denies any recent falls; pt reports she does not have to go to basement to do laundry (someone else can assist)     Hand Dominance        Extremity/Trunk Assessment   Upper Extremity Assessment Upper Extremity Assessment: Generalized weakness    Lower Extremity Assessment Lower Extremity Assessment: Generalized weakness    Cervical / Trunk Assessment Cervical / Trunk Assessment: Normal  Communication   Communication: No difficulties  Cognition Arousal/Alertness: Awake/alert Behavior During Therapy: WFL for tasks assessed/performed Overall Cognitive Status: (Oriented to person and place; partially to situation)                                 General Comments: Pt intermittently appearing confused during session      General Comments General comments (skin integrity, edema, etc.): Pt resting in bed upon PT entry (no alarm on bed)--pt reporting just getting back to bed from chair; pt's son present during session.  Nursing cleared pt for participation in physical therapy.  Pt agreeable to PT session.    Exercises  Gait training   Assessment/Plan    PT Assessment Patient needs continued PT services  PT Problem List Decreased strength;Decreased activity tolerance;Decreased balance;Decreased mobility;Decreased knowledge of use of DME;Decreased knowledge of precautions       PT Treatment Interventions DME instruction;Gait training;Stair training;Functional mobility training;Therapeutic activities;Therapeutic exercise;Balance training;Patient/family education    PT Goals (Current goals can be found in the Care Plan section)  Acute Rehab PT Goals Patient Stated Goal: to go home PT Goal Formulation: With patient Time For Goal Achievement: 04/28/18 Potential to Achieve Goals: Good    Frequency Min 2X/week   Barriers to discharge         Co-evaluation               AM-PAC PT "6 Clicks" Daily Activity  Outcome Measure Difficulty turning over in bed (including adjusting bedclothes, sheets and blankets)?: None Difficulty moving from lying on back to sitting on the side of the bed? : A Little Difficulty sitting down on and standing up from a chair with arms (e.g., wheelchair, bedside commode, etc,.)?: Unable Help needed moving to and from a bed to chair (including a wheelchair)?: A Little Help needed walking in hospital room?: A Little Help needed climbing 3-5 steps with a railing? : A Little 6 Click Score: 17    End of Session Equipment Utilized During Treatment: Gait belt Activity Tolerance: Patient tolerated treatment well Patient left: in bed;with call bell/phone within reach;with bed alarm set;with nursing/sitter in room;with family/visitor present Nurse Communication: Mobility status;Precautions;Other (comment)(Pt's HR status during session) PT Visit Diagnosis: Unsteadiness on feet (R26.81);Other abnormalities of gait and mobility (R26.89);Muscle weakness (generalized) (M62.81)    Time: 5397-6734 PT Time Calculation (min) (ACUTE ONLY): 24 min   Charges:   PT Evaluation $PT Eval Low Complexity: 1 Low PT Treatments $Gait Training: 8-22 mins       Leitha Bleak, PT 04/14/18, 5:01  PM [phone]

## 2018-04-14 NOTE — Progress Notes (Signed)
Progress Note  Patient Name: Leah Hayden Date of Encounter: 04/14/2018  Primary Cardiologist: Leah Rogue, MD   Subjective   No significant events overnight On review of telemetry Heart rate improved into the 50s to 60s, stable Blood pressure elevated No atrial fibrillation Ate a good breakfast Lots of family at the bedside, Both sisters Lots of concern about her dementia and taking care of herself at home Questions concerning various supports that might be available family concerned that "something is happened"  As she seems to have taken a step back in terms of her functionality  Inpatient Medications    Scheduled Meds: . cholecalciferol  5,000 Units Oral Daily  . donepezil  5 mg Oral QHS  . losartan  50 mg Oral Daily  . mometasone-formoterol  2 puff Inhalation BID  . montelukast  10 mg Oral QPM  . multivitamin with minerals  1 tablet Oral Daily  . Rivaroxaban  15 mg Oral Q supper  . sodium chloride flush  3 mL Intravenous Q12H  . vitamin B-12  500 mcg Oral QODAY   Continuous Infusions: . sodium chloride    . sodium chloride 50 mL/hr at 04/13/18 2100  . cefTRIAXone (ROCEPHIN)  IV Stopped (04/13/18 2016)   PRN Meds: sodium chloride, acetaminophen **OR** acetaminophen, albuterol, HYDROcodone-acetaminophen, nitroGLYCERIN, ondansetron **OR** ondansetron (ZOFRAN) IV, polyethylene glycol, sodium chloride flush   Vital Signs    Vitals:   04/13/18 1655 04/13/18 1932 04/14/18 0512 04/14/18 0801  BP: (!) 178/66 (!) 157/56 (!) 169/76 (!) 155/73  Pulse: (!) 57 (!) 57 (!) 58 67  Resp: 18   16  Temp: 97.8 F (36.6 C) 98.1 F (36.7 C) 97.7 F (36.5 C) 98.4 F (36.9 C)  TempSrc: Oral Oral Oral Oral  SpO2: 100% 98% 97% 99%  Weight:      Height:        Intake/Output Summary (Last 24 hours) at 04/14/2018 1508 Last data filed at 04/14/2018 1416 Gross per 24 hour  Intake 1620 ml  Output 301 ml  Net 1319 ml   Filed Weights   04/12/18 0653 04/12/18 1650  Weight: 56.7  kg 56.1 kg    Telemetry    Sinus bradycardia - Personally Reviewed  ECG      Physical Exam   Constitutional:  oriented to person, place, and time. No distress.  HENT:  Head: Normocephalic and atraumatic.  Eyes:  no discharge. No scleral icterus.  Neck: Normal range of motion. Neck supple. No JVD present.  Cardiovascular: Normal rate, regular rhythm, normal heart sounds and intact distal pulses. Exam reveals no gallop and no friction rub. No edema No murmur heard. Pulmonary/Chest: Effort normal and breath sounds normal. No stridor. No respiratory distress.  no wheezes.  no rales.  no tenderness.  Abdominal: Soft.  no distension.  no tenderness.  Musculoskeletal: Normal range of motion.  no  tenderness or deformity.  Neurological:  normal muscle tone. Coordination normal. No atrophy Skin: Skin is warm and dry. No rash noted. not diaphoretic.  Psychiatric:  Quiet, communicative when asked question, increasingly alert over the past 2 days   Labs    Chemistry Recent Labs  Lab 04/12/18 0738 04/13/18 0434  NA 141 140  K 4.0 4.1  CL 102 106  CO2 31 29  GLUCOSE 102* 103*  BUN 21 22  CREATININE 0.85 0.89  CALCIUM 9.5 8.8*  PROT 6.5  --   ALBUMIN 3.9  --   AST 23  --   ALT  21  --   ALKPHOS 61  --   BILITOT 1.2  --   GFRNONAA 59* 56*  GFRAA >60 >60  ANIONGAP 8 5     Hematology Recent Labs  Lab 04/12/18 0738  WBC 5.0  RBC 4.25  HGB 13.6  HCT 39.4  MCV 92.5  MCH 32.1  MCHC 34.7  RDW 13.2  PLT 169    Cardiac Enzymes Recent Labs  Lab 04/12/18 1119 04/12/18 1703 04/12/18 2222 04/13/18 0434  TROPONINI <0.03 0.06* 0.05* 0.04*   No results for input(s): TROPIPOC in the last 168 hours.   BNPNo results for input(s): BNP, PROBNP in the last 168 hours.   DDimer No results for input(s): DDIMER in the last 168 hours.   Radiology    No results found.  Cardiac Studies     Patient Profile      [name] is a very pleasant 84 year old woman with a  history of  SVT, pauses on Holter,  admitted to the hospital August 2012 with atrial fibrillation  converted shortly after to normal sinus rhythm At least one additional episode of atrial fibrillation since then converted from afib to sinus, she had a 7.2 second pause, she was asymptomatic. She was started on antiarrhythmic therapy in order to prevent recurrent afib and subsequently recurrent post-termination pauses. Eliquis was also started.  Cardiac catheterization in 2015 showing no significant CAD Progressive  dementia Presenting to the hospital with weakness and general malaise  Assessment & Plan    A/P: Sinus bradycardia Improved by holding flecainide Will continue to hold flecainide at discharge No beta blockers or AV node blocking agents  Paroxysmal atrial fibrillation Heart rate in the 40s on arrival Flecainide held with improvement of rate up into the 50s -if she has episodes of paroxysmal atrial fibrillation, would treat with diltiazem 30 mg pills For frequent episodes of paroxysmal atrial fibrillation and conversion pauses might even need a pacemaker Continue anticoagulation  Hypertension Recommended losartan 50 mg daily  Dementia Likely predominant reason for her admission Patient trying to live independently but having difficulty  progressive over the past year, has been rapid Essentially is housebound and should not be driving Unable to go get her groceries and appears to be losing weight Family trying to help but leading independent lives Recommend Electrical engineer for home supports PT consult, may need home PT Consider Meals on Wheels as she should not be driving and appears to be losing weight Not sure family has been keeping up with her food. Patient eats crackers Has some diarrhea past several weeks, unclear if this is dietary --Condition will likely worsen,  family will likely need to increase their assistance  Long discussion with family at  the bedside concerning the above  Total encounter time more than 35 minutes  Greater than 50% was spent in counseling and coordination of care with the patient   For questions or updates, please contact [facility] Please consult [email or web address] for contact info under Cardiology/STEMI.      Signed, Leah Rogue, MD  04/14/2018, 3:08 PM

## 2018-04-14 NOTE — Progress Notes (Signed)
[place] [facility] [place] NAME: Leah Hayden    MR#:  [id number]  DATE OF BIRTH:  November 18, 1927  SUBJECTIVE:  CHIEF COMPLAINT:   Chief Complaint  Patient presents with  . Emesis  . Weakness  Patient seen and evaluated today No chest pain No palpitations Tolerating diet well  REVIEW OF SYSTEMS:    ROS  CONSTITUTIONAL: No documented fever. Has fatigue, weakness. No weight gain, no weight loss.  EYES: No blurry or double vision.  ENT: No tinnitus. No postnasal drip. No redness of the oropharynx.  RESPIRATORY: No cough, no wheeze, no hemoptysis. No dyspnea.  CARDIOVASCULAR: No chest pain. No orthopnea. No palpitations. No syncope.  GASTROINTESTINAL: No nausea, no vomiting or diarrhea. No abdominal pain. No melena or hematochezia.  GENITOURINARY: No dysuria or hematuria.  ENDOCRINE: No polyuria or nocturia. No heat or cold intolerance.  HEMATOLOGY: No anemia. No bruising. No bleeding.  INTEGUMENTARY: No rashes. No lesions.  MUSCULOSKELETAL: No arthritis. No swelling. No gout.  NEUROLOGIC: No numbness, tingling, or ataxia. No seizure-type activity.  PSYCHIATRIC: No anxiety. No insomnia. No ADD.   DRUG ALLERGIES:   Allergies  Allergen Reactions  . Amiodarone Other (See Comments)    Chest discomfort  . Other Rash    oramycin    VITALS:  Blood pressure (!) 155/73, pulse 67, temperature 98.4 F (36.9 C), temperature source Oral, resp. rate 16, height 5\' 5"  (1.651 m), weight 56.1 kg, SpO2 99 %.  PHYSICAL EXAMINATION:   Physical Exam  GENERAL:  84 y.o.-year-old patient lying in the bed with no acute distress.  EYES: Pupils equal, round, reactive to light and accommodation. No scleral icterus. Extraocular muscles intact.  HEENT: Head atraumatic, normocephalic. Oropharynx and nasopharynx clear.  NECK:  Supple, no jugular venous distention. No thyroid enlargement, no tenderness.  LUNGS: Normal breath sounds bilaterally, no wheezing, rales,  rhonchi. No use of accessory muscles of respiration.  CARDIOVASCULAR: S1, S2 regular. No murmurs, rubs, or gallops.  ABDOMEN: Soft, nontender, nondistended. Bowel sounds present. No organomegaly or mass.  EXTREMITIES: No cyanosis, clubbing or edema b/l.    NEUROLOGIC: Cranial nerves II through XII are intact. No focal Motor or sensory deficits b/l.   Hard of hearing Has dementia PSYCHIATRIC: The patient is alert and oriented x 3.  SKIN: No obvious rash, lesion, or ulcer.   LABORATORY PANEL:   CBC Recent Labs  Lab 04/12/18 0738  WBC 5.0  HGB 13.6  HCT 39.4  PLT 169   ------------------------------------------------------------------------------------------------------------------ Chemistries  Recent Labs  Lab 04/12/18 0738 04/13/18 0434  NA 141 140  K 4.0 4.1  CL 102 106  CO2 31 29  GLUCOSE 102* 103*  BUN 21 22  CREATININE 0.85 0.89  CALCIUM 9.5 8.8*  MG  --  2.2  AST 23  --   ALT 21  --   ALKPHOS 61  --   BILITOT 1.2  --    ------------------------------------------------------------------------------------------------------------------  Cardiac Enzymes Recent Labs  Lab 04/13/18 0434  TROPONINI 0.04*   ------------------------------------------------------------------------------------------------------------------  RADIOLOGY:  No results found.   ASSESSMENT AND PLAN:  84 year old elderly female patient with history of tachybradycardia syndrome, GERD, breast cancer, myocardial infarction, Mnire's disease, proximal atrial fibrillation currently on anticoagulation with Xarelto under hospitalist service for recurrent tachycardia bradycardia syndrome  -Acute recurrent tachybradycardia syndrome Bradycardia resolved cardiology follow-up appreciated Flecainide on hold Anticoagulation with Xarelto  -Uncontrolled hypertension Add losartan today  -Acute cystitis Completed course of abx  -History of proximal atrial  fibrillation Continue anticoagulations  Xarelto  -Bronchial asthma stable Continue home dose inhalers  -Chronic GERD Oral PPI  -Dementia Supportive care May need home health services and physical therapy services at disposition PT evaluation today   All the records are reviewed and case discussed with Care Management/Social Worker. Management plans discussed with the patient, family and they are in agreement.  CODE STATUS: DNR  DVT Prophylaxis: SCDs  TOTAL TIME TAKING CARE OF THIS PATIENT: 33 minutes.   POSSIBLE D/C IN 2 to 3 DAYS, DEPENDING ON CLINICAL CONDITION.  Saundra Shelling M.D on 04/14/2018 at 12:00 PM  Between 7am to 6pm - Pager - [phone]  After 6pm go to [email or web address] - password [email or web address] [facility]  Office  [phone]  CC: Primary care physician; Venia Carbon, MD  Note: This dictation was prepared with Dragon dictation along with smaller phrase technology. Any transcriptional errors that result from this process are unintentional.

## 2018-04-15 MED ORDER — LOSARTAN POTASSIUM 50 MG PO TABS: 50.0000 mg | ORAL_TABLET | Freq: Every day | ORAL | 0 refills | Status: DC

## 2018-04-15 NOTE — Progress Notes (Signed)
Pt to be discharged home with home health today. Iv and tele removed. disch instructions and prescrip given to pt's family . disch via w.c. Accompanied by family

## 2018-04-15 NOTE — Care Management Note (Signed)
Case Management Note  Patient Details  Name: Leah Hayden MRN: [id number] Date of Birth: [birth year]  Subjective/Objective:                   RNCM met with patient and her daughter-in-law Leah Hayden (owns [facility]) to discuss discharge today. RNCM has updated PCP. Patient will go to son's house "for a few days" Leah Hayden and Leah Hayden [place] [phone]. RNCM explained to Leah Hayden that PT is recommending intermittent assistance in the home as patient is from home alone. She has a rolling walker, rollator, and bedside commode available to use at home.  Action/Plan:  Home health and private duty list provided to patient for review. Leah Hayden picked [facility] and patient agreed. Per [facility] they cannot take patient for home health due to "staffing shortage".  Leah Hayden with [facility] was her second choice and they are out of network. RNCM spoke with Leah Hayden with [facility] home health and they will take patient. Patient/Leah Hayden updated.   Expected Discharge Date:  04/15/18               Expected Discharge Plan:     In-House Referral:     Discharge planning Services  CM Consult  Post Acute Care Choice:  Home Health Choice offered to:  Patient, Adult Children  DME Arranged:    DME Agency:     HH Arranged:    [place] Agency:  [place]  Status of Service:  Completed, signed off  If discussed at [place] of Stay Meetings, dates discussed:    Additional Comments:  Leah Garfinkel, RN 04/15/2018, 10:37 AM

## 2018-04-15 NOTE — Plan of Care (Signed)

## 2018-04-15 NOTE — Care Management Important Message (Signed)
Important Message  Patient Details  Name: Leah Hayden MRN: [id number] Date of Birth: Jan 27, 1928   Medicare Important Message Given:  Yes    Juliann Pulse A Allmond 04/15/2018, 9:51 AM

## 2018-04-15 NOTE — Discharge Summary (Signed)
[facility] NAME: Leah Hayden    MR#:  [id number]  DATE OF BIRTH:  Mar 30, 1928  DATE OF ADMISSION:  04/12/2018 ADMITTING PHYSICIAN: Gorden Harms, MD  DATE OF DISCHARGE: 04/15/2018  PRIMARY CARE PHYSICIAN: Venia Carbon, MD   ADMISSION DIAGNOSIS:  Sinus node dysfunction ([place]) [I49.5] Generalized weakness [R53.1] Acute recurrent tachybradycardia syndrome History of paroxysmal atrial fibrillation Bronchial asthma GERD Dementia DISCHARGE DIAGNOSIS:  Active Problems:   Arrhythmia Recurrent tachybradycardia syndrome Paroxysmal atrial fibrillation Bronchial asthma GERD  SECONDARY DIAGNOSIS:   Past Medical History:  Diagnosis Date  . Allergic rhinitis   . Asthma   . Biceps tendon tear    a. right->s/p surgical repair winter of 2014.  . Diverticulosis of colon   . GERD (gastroesophageal reflux disease)   . Hammer toe of right foot   . Hx of colonic polyps   . HX: breast cancer   . Meniere's disease   . NSTEMI (non-ST elevated myocardial infarction) ([place])    a. 11/2012 in setting of rapid afib;  b. 11/2012 Cath: nl EF, nonobs CAD->Med Rx;  b. 11/2012 Echo: EF 60-65%, mild LVH, mild MR/TR.  . Osteoarthritis   . PAF (paroxysmal atrial fibrillation) ([place])    a. Eliquis and amio initiated 11/2012 in setting of admission with RVR and NSTEMI;  b. Subsequently changed to flecainide and xarelto;  c. 11/2013 Echo: EF 60-65%, mild LVH, mod MR/TR.  Marland Kitchen Sinus brady-tachy syndrome ([place])    a. Post-conversion pause of 7.2 seconds during 11/2012 hospitalization @ [facility]; b. 04/2017 Event Monitor: No significant arrhythmias.     ADMITTING HISTORY Leah Hayden  is a 84 y.o. female with a known history per below which includes tachybradycardia syndrome with sinus pause, resenting to the emergency room with 2-week history of generalized weakness/fatigue, during this time patient also had intermittent episodes of diarrhea, in the emergency room  patient was found to have A. fib with RVR with heart rate in the 140s-patient was treated with IV diltiazem, while in the emergency room patient had several episodes of sinus pauses with bradycardia with heart rate in the 20s/30s, ER work-up noted for questionable UTI, case was discussed between ED attending and Dr. Gollan/cardiology-recommended outpatient follow-up versus inpatient evaluation over the weekend, family elects to stay in the hospital for further evaluation given continued episodes of severe bradycardia, per ED attending-cardiology believes patient may not be compliant with flecainide as directed at home, patient evaluated at the bedside, daughter-in-law/2 sons at the bedside, patient denies any pain, shortness of breath, patient is now being admitted for acute recurrent tachybradycardia syndrome, acute probable UTI, patient wishes to be DNR going forward.   HOSPITAL COURSE:  Patient admitted to stepdown unit initially.  Flecainide was stopped secondary to bradycardia.  Patient in the emergency room received IV diltiazem for A. fib with rapid rate.  She then developed bradycardia with sinus pause heart rate was around 20 to 30 bpm.  She was put in stepdown unit and flecainide was stopped diltiazem was stopped.  Patient's heart rate improved.  He was transferred to telemetry.  Cardiology evaluation was done.  Troponins were mildly elevated secondary to demand ischemia from atrial fibrillation.  Urine culture during the hospitalization showed insignificant growth.  Patient was worked up with echocardiogram.  Left ventricle: The cavity size was normal. There was mild   concentric hypertrophy. Systolic function was normal. The   estimated ejection fraction was in the range of 60%  to 65%. Wall   motion was normal; there were no regional wall motion   abnormalities. Doppler parameters are consistent with abnormal   left ventricular relaxation (grade 1 diastolic dysfunction). - Left atrium: The  atrium was mildly dilated. - Right ventricle: Systolic function was normal. - Pulmonary arteries: Systolic pressure was within the normal   range. - Pericardium, extracardiac: A trivial pericardial effusion was   identified. Patient's heart rate improved.  Tolerated physical therapy well.  She will be discharged home with home physical therapy and social work services along with healthcare aide services.  CONSULTS OBTAINED:  Treatment Team:  Minna Merritts, MD [facility], [facility], MD  DRUG ALLERGIES:   Allergies  Allergen Reactions  . Amiodarone Other (See Comments)    Chest discomfort  . Other Rash    oramycin    DISCHARGE MEDICATIONS:   Allergies as of 04/15/2018      Reactions   Amiodarone Other (See Comments)   Chest discomfort   Other Rash   oramycin      Medication List    STOP taking these medications   flecainide 50 MG tablet Commonly known as:  TAMBOCOR     TAKE these medications   ADVAIR DISKUS 100-50 MCG/DOSE Aepb Generic drug:  Fluticasone-Salmeterol Inhale 1 puff into the lungs every 12 (twelve) hours.   CENTRUM SILVER 50+WOMEN Tabs Take 1 tablet by mouth daily.   cholecalciferol 1000 units tablet Commonly known as:  VITAMIN D Take 5,000 Units by mouth daily.   donepezil 5 MG tablet Commonly known as:  ARICEPT TAKE 1 TABLET (5 MG TOTAL) BY MOUTH AT BEDTIME.   losartan 50 MG tablet Commonly known as:  COZAAR Take 1 tablet (50 mg total) by mouth daily.   montelukast 10 MG tablet Commonly known as:  SINGULAIR Take 10 mg by mouth every evening.   nitroGLYCERIN 0.4 MG SL tablet Commonly known as:  NITROSTAT Place 1 tablet (0.4 mg total) under the tongue every 5 (five) minutes as needed for chest pain.   PROVENTIL HFA 108 (90 Base) MCG/ACT inhaler Generic drug:  albuterol Inhale 2 puffs into the lungs every 6 (six) hours as needed.   vitamin B-12 500 MCG tablet Commonly known as:  CYANOCOBALAMIN Take 500 mcg by mouth every other day.    XARELTO 20 MG Tabs tablet Generic drug:  rivaroxaban TAKE 1 TABLET BY MOUTH EVERY DAY WITH SUPPER       Today  Patient seen today Tolerating diet well No bradycardia No complaints VITAL SIGNS:  Blood pressure (!) 142/68, pulse (!) 59, temperature 98 F (36.7 C), temperature source Oral, resp. rate 16, height 5\' 5"  (1.651 m), weight 56.1 kg, SpO2 97 %.  I/O:    Intake/Output Summary (Last 24 hours) at 04/15/2018 1237 Last data filed at 04/15/2018 0209 Gross per 24 hour  Intake 360 ml  Output 0 ml  Net 360 ml    PHYSICAL EXAMINATION:  Physical Exam  GENERAL:  84 y.o.-year-old patient lying in the bed with no acute distress.  LUNGS: Normal breath sounds bilaterally, no wheezing, rales,rhonchi or crepitation. No use of accessory muscles of respiration.  CARDIOVASCULAR: S1, S2 normal. No murmurs, rubs, or gallops.  ABDOMEN: Soft, non-tender, non-distended. Bowel sounds present. No organomegaly or mass.  NEUROLOGIC: Moves all 4 extremities. PSYCHIATRIC: The patient is alert and oriented x 3.  SKIN: No obvious rash, lesion, or ulcer.   DATA REVIEW:   CBC Recent Labs  Lab 04/12/18 0738  WBC 5.0  HGB 13.6  HCT 39.4  PLT 169    Chemistries  Recent Labs  Lab 04/12/18 0738 04/13/18 0434  NA 141 140  K 4.0 4.1  CL 102 106  CO2 31 29  GLUCOSE 102* 103*  BUN 21 22  CREATININE 0.85 0.89  CALCIUM 9.5 8.8*  MG  --  2.2  AST 23  --   ALT 21  --   ALKPHOS 61  --   BILITOT 1.2  --     Cardiac Enzymes Recent Labs  Lab 04/13/18 0434  TROPONINI 0.04*    Microbiology Results  Results for orders placed or performed during the hospital encounter of 04/12/18  Urine Culture     Status: Abnormal   Collection Time: 04/12/18  7:38 AM  Result Value Ref Range Status   Specimen Description   Final    URINE, RANDOM Performed at [facility], [address] [place], [place] [address]    Special Requests   Final    NONE Performed at [facility],  [address] [place], [place] [address]    Culture >=100,000 COLONIES/mL ESCHERICHIA COLI (A)  Final   Report Status 04/14/2018 FINAL  Final   Organism ID, Bacteria ESCHERICHIA COLI (A)  Final      Susceptibility   Escherichia coli - MIC*    AMPICILLIN >=32 RESISTANT Resistant     CEFAZOLIN 16 SENSITIVE Sensitive     CEFTRIAXONE <=1 SENSITIVE Sensitive     CIPROFLOXACIN <=0.25 SENSITIVE Sensitive     GENTAMICIN >=16 RESISTANT Resistant     IMIPENEM <=0.25 SENSITIVE Sensitive     NITROFURANTOIN <=16 SENSITIVE Sensitive     TRIMETH/SULFA >=320 RESISTANT Resistant     AMPICILLIN/SULBACTAM >=32 RESISTANT Resistant     PIP/TAZO <=4 SENSITIVE Sensitive     Extended ESBL NEGATIVE Sensitive     * >=100,000 COLONIES/mL ESCHERICHIA COLI  MRSA PCR Screening     Status: None   Collection Time: 04/12/18  4:52 PM  Result Value Ref Range Status   MRSA by PCR NEGATIVE NEGATIVE Final    Comment:        The GeneXpert MRSA Assay (FDA approved for NASAL specimens only), is one component of a comprehensive MRSA colonization surveillance program. It is not intended to diagnose MRSA infection nor to guide or monitor treatment for MRSA infections. Performed at [facility], [address] [place], [place] [address]   Urine culture     Status: Abnormal   Collection Time: 04/12/18  7:12 PM  Result Value Ref Range Status   Specimen Description   Final    URINE, CATHETERIZED Performed at [facility], [address], [place], [place] [address]    Special Requests   Final    NONE Performed at [facility], [address] [place], [place] [address]    Culture (A)  Final    <10,000 COLONIES/mL INSIGNIFICANT GROWTH Performed at [facility] [address], [place], [place] [address]    Report Status 04/14/2018 FINAL  Final    RADIOLOGY:  No results found.  Follow up with PCP in 1 week.  Management plans discussed with the patient, family and they  are in agreement.  CODE STATUS: DNR    Code Status Orders  (From admission, onward)         Start     Ordered   04/12/18 1634  Do not attempt resuscitation (DNR)  Continuous    Question Answer Comment  In the event of cardiac or respiratory ARREST Do not call a "code blue"   In the event of cardiac or respiratory ARREST Do not perform Intubation, CPR, defibrillation or ACLS   In the event of cardiac or respiratory ARREST Use medication by any route, position, wound care, and other measures to relive pain and suffering. May use oxygen, suction and manual treatment of airway obstruction as needed for comfort.   Comments Nurse may pronounce      04/12/18 1633        Code Status History    This patient has a current code status but no historical code status.    Advance Directive Documentation     Most Recent Value  Type of Advance Directive  Healthcare Power of Attorney, Living will  Pre-existing out of facility DNR order (yellow form or pink MOST form)  -  "MOST" Form in Place?  -      TOTAL TIME TAKING CARE OF THIS PATIENT ON DAY OF DISCHARGE: more than 34 minutes.   Saundra Shelling M.D on 04/15/2018 at 12:37 PM  Between 7am to 6pm - Pager - [phone]  After 6pm go to [email or web address] - password [email or web address] [facility]  Office  [phone]  CC: Primary care physician; Venia Carbon, MD  Note: This dictation was prepared with Dragon dictation along with smaller phrase technology. Any transcriptional errors that result from this process are unintentional.

## 2018-04-16 ENCOUNTER — Telehealth: Payer: Self-pay | Admitting: *Deleted

## 2018-04-16 ENCOUNTER — Telehealth: Payer: Self-pay | Admitting: Cardiovascular Disease

## 2018-04-16 DIAGNOSIS — I2510 Atherosclerotic heart disease of native coronary artery without angina pectoris: Secondary | ICD-10-CM | POA: Diagnosis not present

## 2018-04-16 DIAGNOSIS — I495 Sick sinus syndrome: Secondary | ICD-10-CM | POA: Diagnosis not present

## 2018-04-16 DIAGNOSIS — E46 Unspecified protein-calorie malnutrition: Secondary | ICD-10-CM | POA: Diagnosis not present

## 2018-04-16 DIAGNOSIS — H8109 Meniere's disease, unspecified ear: Secondary | ICD-10-CM | POA: Diagnosis not present

## 2018-04-16 DIAGNOSIS — M1990 Unspecified osteoarthritis, unspecified site: Secondary | ICD-10-CM | POA: Diagnosis not present

## 2018-04-16 DIAGNOSIS — J4520 Mild intermittent asthma, uncomplicated: Secondary | ICD-10-CM | POA: Diagnosis not present

## 2018-04-16 DIAGNOSIS — I480 Paroxysmal atrial fibrillation: Secondary | ICD-10-CM | POA: Diagnosis not present

## 2018-04-16 DIAGNOSIS — K219 Gastro-esophageal reflux disease without esophagitis: Secondary | ICD-10-CM | POA: Diagnosis not present

## 2018-04-16 DIAGNOSIS — E785 Hyperlipidemia, unspecified: Secondary | ICD-10-CM | POA: Diagnosis not present

## 2018-04-16 DIAGNOSIS — F0150 Vascular dementia without behavioral disturbance: Secondary | ICD-10-CM | POA: Diagnosis not present

## 2018-04-16 DIAGNOSIS — K5790 Diverticulosis of intestine, part unspecified, without perforation or abscess without bleeding: Secondary | ICD-10-CM | POA: Diagnosis not present

## 2018-04-16 DIAGNOSIS — I10 Essential (primary) hypertension: Secondary | ICD-10-CM | POA: Diagnosis not present

## 2018-04-16 DIAGNOSIS — I252 Old myocardial infarction: Secondary | ICD-10-CM | POA: Diagnosis not present

## 2018-04-16 LAB — CALCIUM, IONIZED: Calcium, Ionized, Serum: 5.2 mg/dL (ref 4.5–5.6)

## 2018-04-16 NOTE — Telephone Encounter (Signed)
Attempted to contact pt to complete TCM. Unable to leave vm

## 2018-04-16 NOTE — Telephone Encounter (Signed)
Pt daughter in law would like to know if pt can get medication for afib sent to [place] .

## 2018-04-17 ENCOUNTER — Telehealth: Payer: Self-pay | Admitting: Cardiovascular Disease

## 2018-04-17 NOTE — Telephone Encounter (Signed)
Left a message for the patient to call back.  

## 2018-04-17 NOTE — Telephone Encounter (Signed)
S/w patient's daughter, listed on DPR. She remembers Dr Rockey Situ mentioned a pill he could give the patient if she goes back into atrial fib because she was taken off the flecainide. It would be an as needed pill. Patient did not receive that prescription prior to leaving. Daughter wants to make sure they're not stuck in the middle of the night needing something and wants to prevent going to the ED.  Routing to Dr Rockey Situ for advice.    Patient's daughter, ok per DPR, contacted regarding discharge from [facility] on 04/15/18.   Patient's daughter understands to follow up with provider ? On 04/22/18 at 2:40pm at [facility].  Patient's daughter understands discharge instructions? Yes Patient's daughter understands medications and regiment? Yes Patient's daughter understands to bring all medications to this visit? Yes

## 2018-04-17 NOTE — Telephone Encounter (Signed)
Attempted to contact pt to complete TCM; vm not available to leave message. CRM created. Should pt return call pls schedule hospital f/u with PCP

## 2018-04-17 NOTE — Telephone Encounter (Signed)
Patient daughter in law returning our call  Please call back

## 2018-04-17 NOTE — Telephone Encounter (Signed)
Was called by family and wondering if she needed any diltiazem. HR 61 with SBP in 90s. Patient having short runs of potential afib but overall rate well controlled. Told to avoid any nodal agents for now due to recent admission for bradycardia. Asked to call back if any sustained RVR or if any clinical symptoms change.

## 2018-04-18 ENCOUNTER — Telehealth: Payer: Self-pay | Admitting: Internal Medicine

## 2018-04-18 NOTE — Telephone Encounter (Signed)
Leah Hayden with [facility] called with OT orders. She is requesting 1xwk for 1 wk, 2xwk for 3 wks, 1 xwk for 1 wk. Best [phone] ok to leave vm.

## 2018-04-18 NOTE — Telephone Encounter (Signed)
That is fine 

## 2018-04-18 NOTE — Telephone Encounter (Signed)
Verbal orders given to Stephanie.  °

## 2018-04-19 MED ORDER — DILTIAZEM HCL 30 MG PO TABS: ORAL_TABLET | ORAL | 1 refills | Status: DC

## 2018-04-19 NOTE — Telephone Encounter (Signed)
I called and spoke with the patient's daughter, [name]. She is aware of Dr. Donivan Scull recommendations to take diltiazem 30 mg one tablet three times a day (every 6-8 hours) as needed for atrial fibrillation.  She states the patient had an episode "the other night," where she was having some fast and slow HR's and complaining of some dizziness.  She also appeared to be a little disoriented.   I have advised [name] to have the patient take the PRN diltiazem for HR's > 100 bpm and to watch for bradycardia. [name] reports BP have been in the 120's/55-61 range.   I have also advised [name], that if over the weekend, the patient continues to have fast and slow HR's and if her HR is < 60 bpm and she has dizziness, lightheadedness, pre-syncope, syncope, with being disoriented, then they need to take her to the ER for further evaluation. [name] verbalizes understanding of the above and is agreeable.

## 2018-04-19 NOTE — Telephone Encounter (Signed)
For paroxysmal atrial fibrillation He could try diltiazem 30 mg pills only as needed If she takes this  on a regular basis she would develop bradycardia

## 2018-04-21 NOTE — Progress Notes (Signed)
Cardiology Office Note  Date:  04/22/2018   ID:  MINNIE LEGROS, DOB 06/23/1928, MRN [id number]  PCP:  Venia Carbon, MD   Chief Complaint  Patient presents with  . other    TCM ED f/u.heart rate irregular 55 to 59 was disoriented and weak.Medications verbally reviewed.Improving.    HPI:  Ms Cumpton is a very pleasant 84 year old woman with a history of  SVT, pauses on Holter,  admitted to the hospital August 2012 with atrial fibrillation  converted shortly after to normal sinus rhythm At least one additional episode of atrial fibrillation since then converted from afib to sinus, she had a 7.2 second pause, she was asymptomatic.  She was started on antiarrhythmic therapy in order to prevent recurrent afib and subsequently recurrent post-termination pauses. Eliquis was also started.  Cardiac catheterization in 2015 showing no significant CAD She presents for routine followup of her atrial fibrillation  Recently in the hospital, Presenting to the hospital with weakness and general malaise End of aug 2019 Sinus bradycardia, beter by holding flecainide Heart rate in the 40s on arrival Problems with progressing dementia  Since d/c. One epsiode of "whoozy" Etiology unclear as blood pressure and heart rate were stable  No complaints on today's visit, family presents with her and reports that her energy and leg strength is improving Eating better with family around Walking better Family reports that there is care 24 7  Denies any near-syncope or syncope No chest pain or shortness of breath  Previously for atrial fibrillation was having fluttering and nausea, none recently  Retired from [employer]  EKG personally reviewed by myself on todays visit Shows normal sinus rhythm with rate 62 bpm T wave inversions inferior leads, precordial leads  Other past medical history reviewed Previous 30-day monitor to capture a week spell with no arrhythmia No significant atrial fibrillation on  30-day monitor through August 2018   in the hospital 10/21/2014 for chest pain. Cardiac enzymes negative Laboratory work showing total cholesterol 129, LDL 59, HDL 55, normal renal function She was kept in the hospital for hypertension and rule out, asymptomatic bradycardia. Her amiodarone was initially held for bradycardia although she was asymptomatic. Heart rate was 45 bpm at rest  Echocardiogram 10/21/2014 shows ejection fraction 60-65%, mildly dilated left atrium, otherwise a normal study  In the middle of April 2015, she awoke in the middle of the night with recurrent tachypalps and chest burning.  she called EMS the following morning.  She was found to be in afib with rvr and was taken to [facility] for eval.  There, troponin was elevated.  Initial ECG showed subtle anterolateral ST elevation.  She was initially treated with IV dilt and oral BB with rate improvement and later conversion.  In the setting of NSTEMI, she underwent cath, which showed nonobs CAD and nl LV fxn. F/U echo corroborated nl LV fxn.   History of torn biceps of her right arm at the end of September 2014. She wore a splint for 6 weeks. Seen by Dr. Daylene Katayama in [place].    PMH:   has a past medical history of Allergic rhinitis, Asthma, Biceps tendon tear, Diverticulosis of colon, GERD (gastroesophageal reflux disease), Hammer toe of right foot, colonic polyps, breast cancer, Meniere's disease, NSTEMI (non-ST elevated myocardial infarction) ([place]), Osteoarthritis, PAF (paroxysmal atrial fibrillation) ([place]), and Sinus brady-tachy syndrome ([place]).  PSH:    Past Surgical History:  Procedure Laterality Date  . ABDOMINAL HYSTERECTOMY    . CARDIAC CATHETERIZATION  11/2013  [place]   2nd to left toe  . ESOPHAGOGASTRODUODENOSCOPY  10/1999   inflammation only   . FOREIGN BODY REMOVAL Right 01/02/2013   Procedure: FOREIGN BODY REMOVAL RIGHT INDEX FINGER;  Surgeon: Cammie Sickle., MD;  Location:  [place];  Service: Orthopedics;  Laterality: Right;  . HAMMER TOE SURGERY  11/15   Dr Milinda Pointer  . MASTECTOMY  1984   left   . MASTECTOMY  1985   right  . PARTIAL HYSTERECTOMY  1966   w/ bladder tack  . ROTATOR CUFF REPAIR  10/09   right   . TOE SURGERY  2001/2002   2nd/3rd,left     Current Outpatient Medications  Medication Sig Dispense Refill  . albuterol (PROVENTIL HFA) 108 (90 BASE) MCG/ACT inhaler Inhale 2 puffs into the lungs every 6 (six) hours as needed.      . cholecalciferol (VITAMIN D) 1000 units tablet Take 5,000 Units by mouth daily.    . cyanocobalamin 500 MCG tablet Take 500 mcg by mouth every other day.     . diltiazem (CARDIZEM) 30 MG tablet Take 1 tablet (30 mg) by mouth every 6-8 hours as needed for breakthrough a-fib 30 tablet 1  . donepezil (ARICEPT) 5 MG tablet TAKE 1 TABLET (5 MG TOTAL) BY MOUTH AT BEDTIME. 90 tablet 3  . Fluticasone-Salmeterol (ADVAIR DISKUS) 100-50 MCG/DOSE AEPB Inhale 1 puff into the lungs every 12 (twelve) hours.      Marland Kitchen losartan (COZAAR) 50 MG tablet Take 1 tablet (50 mg total) by mouth daily. 30 tablet 0  . montelukast (SINGULAIR) 10 MG tablet Take 10 mg by mouth every evening.     . Multiple Vitamins-Minerals (CENTRUM SILVER 50+WOMEN) TABS Take 1 tablet by mouth daily.    . nitroGLYCERIN (NITROSTAT) 0.4 MG SL tablet Place 1 tablet (0.4 mg total) under the tongue every 5 (five) minutes as needed for chest pain. 25 tablet 1  . XARELTO 20 MG TABS tablet TAKE 1 TABLET BY MOUTH EVERY DAY WITH SUPPER 30 tablet 3   No current facility-administered medications for this visit.      Allergies:   Amiodarone and Other   Social History:  The patient  reports that she has never smoked. She has never used smokeless tobacco. She reports that she does not drink alcohol or use drugs.   Family History:   family history includes Arthritis in her brother and mother; Cancer in her maternal grandfather; Colon cancer in her maternal  grandfather; Diabetes in her maternal grandmother; Gout in her mother; Heart disease in her father and mother; Heart failure in her father and mother.    Review of Systems: Review of Systems  Constitutional: Negative.   Respiratory: Negative.   Cardiovascular: Negative.   Gastrointestinal: Negative.   Musculoskeletal: Negative.        Gait instability  Skin: Negative.   Psychiatric/Behavioral: Positive for memory loss.  All other systems reviewed and are negative.    PHYSICAL EXAM: VS:  BP 138/74 (BP Location: Left Arm, Patient Position: Sitting, Cuff Size: Normal)   Pulse 62   Ht 5\' 4"  (1.626 m)   Wt 130 lb 4 oz (59.1 kg)   BMI 22.36 kg/m  , BMI Body mass index is 22.36 kg/m.   Constitutional:  oriented to person, place, and time. No distress.  HENT:  Head: Normocephalic and atraumatic.  Eyes:  no discharge. No scleral icterus.  Neck: Normal range of motion. Neck  supple. No JVD present.  Cardiovascular: Normal rate, regular rhythm, normal heart sounds and intact distal pulses. Exam reveals no gallop and no friction rub. No edema No murmur heard. Pulmonary/Chest: Effort normal and breath sounds normal. No stridor. No respiratory distress.  no wheezes.  no rales.  no tenderness.  Abdominal: Soft.  no distension.  no tenderness.  Musculoskeletal: Normal range of motion.  no  tenderness or deformity.  Neurological:  normal muscle tone. Coordination normal. No atrophy Skin: Skin is warm and dry. No rash noted. not diaphoretic.  Psychiatric: Flat affect, communicative when asked questions but did not participate much in the conversation   Recent Labs: 04/12/2018: ALT 21; Hemoglobin 13.6; Platelets 169; TSH 1.143 04/13/2018: BUN 22; Creatinine, Ser 0.89; Magnesium 2.2; Potassium 4.1; Sodium 140    Lipid Panel Lab Results  Component Value Date   CHOL 196 06/14/2016   HDL 65.30 06/14/2016   LDLCALC 115 (H) 06/14/2016   TRIG 80.0 06/14/2016      Wt Readings from Last 3  Encounters:  04/22/18 130 lb 4 oz (59.1 kg)  04/12/18 123 lb 10.9 oz (56.1 kg)  02/22/18 128 lb (58.1 kg)       ASSESSMENT AND PLAN:  PAF (paroxysmal atrial fibrillation) ([place]) - Plan: EKG 12-Lead Denies any near-syncope or syncope, no tachycardia Flecainide held for sick sinus syndrome and symptomatic bradycardia Recommend we take diltiazem 30 mg pills as needed for heart rate over 90 bpm concerning for atrial fibrillation  SVT/ PSVT/ PAT Denies having any significant arrhythmia, no medication changes made Continue current medications. Stable Discussed with family  Atherosclerosis of native coronary artery of native heart with angina pectoris  (HCC) Previous chest tightness, none recently Previous cardiac catheterization with no significant disease No further work-up needed  Memory loss:  Managed by primary care, started on Aricept Family now helping more Moderate cognitive decline Losing her independence.  We should not drive   Total encounter time more than 45 minutes  Greater than 50% was spent in counseling and coordination of care with the patient   Disposition:   F/U  6 months   Orders Placed This Encounter  Procedures  . EKG 12-Lead     Signed, Esmond Plants, M.D., Ph.D. 04/22/2018  [place], [place]

## 2018-04-22 ENCOUNTER — Encounter: Payer: Self-pay | Admitting: Cardiovascular Disease

## 2018-04-22 ENCOUNTER — Other Ambulatory Visit: Payer: Self-pay

## 2018-04-22 ENCOUNTER — Ambulatory Visit: Payer: PPO | Admitting: Cardiovascular Disease

## 2018-04-22 VITALS — BP 138/74 | HR 62 | Ht 64.0 in | Wt 130.2 lb

## 2018-04-22 DIAGNOSIS — I471 Supraventricular tachycardia: Secondary | ICD-10-CM | POA: Diagnosis not present

## 2018-04-22 DIAGNOSIS — I25119 Atherosclerotic heart disease of native coronary artery with unspecified angina pectoris: Secondary | ICD-10-CM | POA: Diagnosis not present

## 2018-04-22 DIAGNOSIS — F0150 Vascular dementia without behavioral disturbance: Secondary | ICD-10-CM | POA: Diagnosis not present

## 2018-04-22 DIAGNOSIS — I495 Sick sinus syndrome: Secondary | ICD-10-CM

## 2018-04-22 DIAGNOSIS — E782 Mixed hyperlipidemia: Secondary | ICD-10-CM | POA: Diagnosis not present

## 2018-04-22 DIAGNOSIS — I480 Paroxysmal atrial fibrillation: Secondary | ICD-10-CM | POA: Diagnosis not present

## 2018-04-22 NOTE — Patient Outreach (Signed)
[facility]) Care Management  04/22/2018  TARENA GOCKLEY [birth year] [id number]  EMMI: general discharge Referral date: 04/22/18 Referral reason: Got discharge papers. " I don't know." Insurance:  Health team advantage Day # 4  Telephone call to patient regarding EMMI general discharge red alert. HIPAA verified with patient.  Explained reason for call. Patient states she is unsure whether she received discharge papers or not.  She states her daughter in law, [name] or Hassan Rowan handles this for.  Patient gave verbal permission to speak with both [name] or Hassan Rowan Feria regarding her health information.    RNCM contacted [name] [place] patients daughter in Sports coach.  She states patient received discharge instructions upon release from the hospital .  Daughter in law states patient has around the clock caregivers at this time.   She states patient is doing better but does have some periods of confusion. She reports patient is getting stronger with her therapy.  Daughter in law states [facility] providing occupational and physical therapy services. Daughter in law states patient has a follow up with the cardiologist today and a follow up with her primary MD on  04/24/18.  Daughter in law states she prepares patients medications in medication box and the caregiver makes sure patient takes them.  Daughter in law states patient has transportation to appointment. Daughter in law states patient had an episode of dizziness and a little confusion 2 days after being discharged to home. Daughter in law states it passed and patient has not had this episode again. She states she will talk with patients cardiologist about it at today's appointment.  Daughter in law denies patient having any new concerns / symptoms. RNCM discussed and offered [facility] care management services. Daughter in law refused services at this time for patient and requested to receive information to talk further with the family.  RNCM  advised patient to notify MD of any changes in condition prior to scheduled appointment. RNCM provided contact name and number: [phone] or main office number [phone] and 24 hour nurse advise line [phone].  RNCM verified patient aware of 911 services for urgent/ emergent needs.  PLAN:  RNCM will close patient due to refusal of services  RNCM will send [facility] brochure/ magnet as discussed RNCM will send closure letter to physician  Quinn Plowman RN,BSN,CCM [facility] Telephonic  [phone]

## 2018-04-22 NOTE — Patient Instructions (Signed)
Search for "pulse meter" on smartphone Monitor heart rate ,waveform  Also check out [email or web address] for monitor  Think about getting a pulse oximeter   Medication Instructions:   No medication changes made  Labwork:  No new labs needed  Testing/Procedures:  No further testing at this time   Follow-Up: It was a pleasure seeing you in the office today. Please call us if you have new issues that need to be addressed before your next appt.  [phone]  Your physician wants you to follow-up in: 6 months.  You will receive a reminder letter in the mail two months in advance. If you don't receive a letter, please call our office to schedule the follow-up appointment.  If you need a refill on your cardiac medications before your next appointment, please call your pharmacy.  For educational health videos Log in to : [email or web address] Or : [email or web address], password : [email or web address]

## 2018-04-24 ENCOUNTER — Encounter: Payer: Self-pay | Admitting: Internal Medicine

## 2018-04-24 ENCOUNTER — Ambulatory Visit (INDEPENDENT_AMBULATORY_CARE_PROVIDER_SITE_OTHER): Payer: PPO | Admitting: Internal Medicine

## 2018-04-24 VITALS — BP 110/64 | HR 66 | Temp 98.5°F | Ht 64.0 in | Wt 131.0 lb

## 2018-04-24 DIAGNOSIS — F0150 Vascular dementia without behavioral disturbance: Secondary | ICD-10-CM | POA: Diagnosis not present

## 2018-04-24 DIAGNOSIS — I480 Paroxysmal atrial fibrillation: Secondary | ICD-10-CM | POA: Diagnosis not present

## 2018-04-24 DIAGNOSIS — Z23 Encounter for immunization: Secondary | ICD-10-CM | POA: Diagnosis not present

## 2018-04-24 DIAGNOSIS — I495 Sick sinus syndrome: Secondary | ICD-10-CM

## 2018-04-24 NOTE — Addendum Note (Signed)
Addended by: Pilar Grammes on: 04/24/2018 09:34 AM   Modules accepted: Orders

## 2018-04-24 NOTE — Assessment & Plan Note (Signed)
Unclear if was fast or slow with the several day prodrome of not feeling right Rapid rate followed by severe bradycardia Seems regular now Off rate meds now Continues on xarelto

## 2018-04-24 NOTE — Assessment & Plan Note (Signed)
If she has any more documented bradycardia off flecainide and regular diltiazem, should consider a pacemaker

## 2018-04-24 NOTE — Assessment & Plan Note (Signed)
Had functional decline with recent hospitalization Now having 24 hour aides for now Will see if cutting back on that seems feasible Currently getting the home health--hopefully can improve with the therapy

## 2018-04-24 NOTE — Progress Notes (Signed)
Subjective:    Patient ID: Leah Hayden, female    DOB: [birth year], 84 y.o.   MRN: [id number]  HPI Here with DIL for hospital follow up  Still recovering from the ordeal of hospitalization Doesn't remember what happened--called DIL and said "I just feel sick" They went to see her and called EMT family member HR low in 75's and BP high EMS activated  Reviewed hospital records A fib with rapid response--but then bradycardia with diltiazem Flecainide stopped DIL started fixing medications a couple of weeks ago---sudden inability to manage this With them checking on her--she seemed to be doing her meds okay  "I felt like I was slowly dying"---before the spell she was sleeping more, etc No chest pain now No palpitations now Breathing is okay--but not exerting much  Does have PT and OT coming to her home Staggering at first when came home Walking with walker at home--has cane here now Limited approvals from HTA though Has had functional decline even before this hospitalization  No longer doing laundry--it is in basement Now not cleaning in house anymore--since episode Stayed with son/DIL at first after hospital---now has 24 hour care for now at home  Current Outpatient Medications on File Prior to Visit  Medication Sig Dispense Refill  . albuterol (PROVENTIL HFA) 108 (90 BASE) MCG/ACT inhaler Inhale 2 puffs into the lungs every 6 (six) hours as needed.      . cholecalciferol (VITAMIN D) 1000 units tablet Take 5,000 Units by mouth daily.    . cyanocobalamin 500 MCG tablet Take 500 mcg by mouth every other day.     . diltiazem (CARDIZEM) 30 MG tablet Take 1 tablet (30 mg) by mouth every 6-8 hours as needed for breakthrough a-fib 30 tablet 1  . donepezil (ARICEPT) 5 MG tablet TAKE 1 TABLET (5 MG TOTAL) BY MOUTH AT BEDTIME. 90 tablet 3  . Fluticasone-Salmeterol (ADVAIR DISKUS) 100-50 MCG/DOSE AEPB Inhale 1 puff into the lungs every 12 (twelve) hours.      Marland Kitchen losartan (COZAAR) 50 MG  tablet Take 1 tablet (50 mg total) by mouth daily. 30 tablet 0  . montelukast (SINGULAIR) 10 MG tablet Take 10 mg by mouth every evening.     . Multiple Vitamins-Minerals (CENTRUM SILVER 50+WOMEN) TABS Take 1 tablet by mouth daily.    . nitroGLYCERIN (NITROSTAT) 0.4 MG SL tablet Place 1 tablet (0.4 mg total) under the tongue every 5 (five) minutes as needed for chest pain. 25 tablet 1  . XARELTO 20 MG TABS tablet TAKE 1 TABLET BY MOUTH EVERY DAY WITH SUPPER 30 tablet 3   No current facility-administered medications on file prior to visit.     Allergies  Allergen Reactions  . Amiodarone Other (See Comments)    Chest discomfort  . Other Rash    oramycin    Past Medical History:  Diagnosis Date  . Allergic rhinitis   . Asthma   . Biceps tendon tear    a. right->s/p surgical repair winter of 2014.  . Diverticulosis of colon   . GERD (gastroesophageal reflux disease)   . Hammer toe of right foot   . Hx of colonic polyps   . HX: breast cancer   . Meniere's disease   . NSTEMI (non-ST elevated myocardial infarction) ([place])    a. 11/2012 in setting of rapid afib;  b. 11/2012 Cath: nl EF, nonobs CAD->Med Rx;  b. 11/2012 Echo: EF 60-65%, mild LVH, mild MR/TR.  . Osteoarthritis   . PAF (paroxysmal  atrial fibrillation) ([place])    a. Eliquis and amio initiated 11/2012 in setting of admission with RVR and NSTEMI;  b. Subsequently changed to flecainide and xarelto;  c. 11/2013 Echo: EF 60-65%, mild LVH, mod MR/TR.  Marland Kitchen Sinus brady-tachy syndrome ([place])    a. Post-conversion pause of 7.2 seconds during 11/2012 hospitalization @ [facility]; b. 04/2017 Event Monitor: No significant arrhythmias.    Past Surgical History:  Procedure Laterality Date  . ABDOMINAL HYSTERECTOMY    . CARDIAC CATHETERIZATION  11/2013   [facility]  . [place]   2nd to left toe  . ESOPHAGOGASTRODUODENOSCOPY  10/1999   inflammation only   . FOREIGN BODY REMOVAL Right 01/02/2013   Procedure: FOREIGN BODY REMOVAL RIGHT INDEX  FINGER;  Surgeon: Cammie Sickle., MD;  Location: [place];  Service: Orthopedics;  Laterality: Right;  . HAMMER TOE SURGERY  11/15   Dr Milinda Pointer  . MASTECTOMY  1984   left   . MASTECTOMY  1985   right  . PARTIAL HYSTERECTOMY  1966   w/ bladder tack  . ROTATOR CUFF REPAIR  10/09   right   . TOE SURGERY  2001/2002   2nd/3rd,left     Family History  Problem Relation Age of Onset  . Heart failure Mother   . Gout Mother   . Heart disease Mother        heart failure  . Arthritis Mother   . Heart failure Father   . Heart disease Father        heart failure  . Diabetes Maternal Grandmother   . Colon cancer Maternal Grandfather   . Cancer Maternal Grandfather        colon  . Arthritis Brother     Social History   Socioeconomic History  . Marital status: Widowed    Spouse name: Not on file  . Number of children: 2  . Years of education: Not on file  . Highest education level: Not on file  Occupational History  . 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 Financial resource strain: Not on file  . Food insecurity:    Worry: Not on file    Inability: Not on file  . Transportation needs:    Medical: Not on file    Non-medical: Not on file  Tobacco Use  . Smoking status: Never Smoker  . Smokeless tobacco: Never Used  Substance and Sexual Activity  . Alcohol use: No    Alcohol/week: 0.0 standard drinks  . Drug use: No  . Sexual activity: Not on file  Lifestyle  . Physical activity:    Days per week: Not on file    Minutes per session: Not on file  . Stress: Not on file  Relationships  . Social connections:    Talks on phone: Not on file    Gets together: Not on file    Attends religious service: Not on file    Active member of club or organization: Not on file    Attends meetings of clubs or organizations: Not on file    Relationship status: Not on file  . Intimate partner violence:    Fear of current or  ex partner: Not on file    Emotionally abused: Not on file    Physically abused: Not on file    Forced sexual activity: Not on file  Other Topics Concern  . Not on file  Social History  Narrative   Now has living will   Sons are health care POAs   Has DNR   No tube feeds if cognitively unaware   Review of Systems Still gets spells of some diarrhea Appetite is "great" now Has regained some weight Sleeps well. Up once for nocturia    Objective:   Physical Exam  Constitutional: She appears well-developed. No distress.  Neck: No thyromegaly present.  Cardiovascular: Normal rate and regular rhythm.  Regular with occasional skips Rate in 35'O Soft systolic murmur  Respiratory: Effort normal and breath sounds normal. No respiratory distress. She has no wheezes. She has no rales.  Musculoskeletal: She exhibits no edema.  Lymphadenopathy:    She has no cervical adenopathy.  Psychiatric: She has a normal mood and affect. Her behavior is normal.           Assessment & Plan:

## 2018-05-14 ENCOUNTER — Ambulatory Visit: Payer: PPO | Admitting: Podiatry

## 2018-05-14 ENCOUNTER — Encounter: Payer: Self-pay | Admitting: Podiatry

## 2018-05-14 ENCOUNTER — Telehealth: Payer: Self-pay | Admitting: Cardiovascular Disease

## 2018-05-14 ENCOUNTER — Encounter

## 2018-05-14 DIAGNOSIS — M79676 Pain in unspecified toe(s): Secondary | ICD-10-CM

## 2018-05-14 DIAGNOSIS — L600 Ingrowing nail: Secondary | ICD-10-CM | POA: Diagnosis not present

## 2018-05-14 DIAGNOSIS — B351 Tinea unguium: Secondary | ICD-10-CM | POA: Diagnosis not present

## 2018-05-14 MED ORDER — LOSARTAN POTASSIUM 50 MG PO TABS: 50.0000 mg | ORAL_TABLET | Freq: Every day | ORAL | 5 refills | Status: DC

## 2018-05-14 NOTE — Telephone Encounter (Signed)
°*  STAT* If patient is at the pharmacy, call can be transferred to refill team.   1. Which medications need to be refilled? (please list name of each medication and dose if known) losartan (COZAAR) 50 MG - 1 tablet daily  2. Which pharmacy/location (including street and city if local pharmacy) is medication to be sent to? [facility] [address]   3. Do they need a 30 day or 90 day supply? 90 day   Was prescribed by a doctor in the hospital but patient is having trouble getting prescription refilled.  Please call if unable to refill.

## 2018-05-16 DIAGNOSIS — I2510 Atherosclerotic heart disease of native coronary artery without angina pectoris: Secondary | ICD-10-CM | POA: Diagnosis not present

## 2018-05-16 DIAGNOSIS — I480 Paroxysmal atrial fibrillation: Secondary | ICD-10-CM | POA: Diagnosis not present

## 2018-05-16 DIAGNOSIS — J4520 Mild intermittent asthma, uncomplicated: Secondary | ICD-10-CM | POA: Diagnosis not present

## 2018-05-16 DIAGNOSIS — F0150 Vascular dementia without behavioral disturbance: Secondary | ICD-10-CM | POA: Diagnosis not present

## 2018-05-16 DIAGNOSIS — I10 Essential (primary) hypertension: Secondary | ICD-10-CM | POA: Diagnosis not present

## 2018-05-16 NOTE — Progress Notes (Signed)
   Subjective: Patient presents today for evaluation of pain to the medial border of the right hallux that began a few weeks ago. Patient is concerned for possible ingrown nail. Wearing shoes and applying pressure to the toe increases the pain. She has not done anything for treatment.  She also complains of thickened, elongated nails of bilateral feet that cause pain while ambulating in shoes. She is unable to trim her own nails. Patient presents today for further treatment and evaluation.  Past Medical History:  Diagnosis Date  . Allergic rhinitis   . Asthma   . Biceps tendon tear    a. right->s/p surgical repair winter of 2014.  . Diverticulosis of colon   . GERD (gastroesophageal reflux disease)   . Hammer toe of right foot   . Hx of colonic polyps   . HX: breast cancer   . Meniere's disease   . NSTEMI (non-ST elevated myocardial infarction) ([place])    a. 11/2012 in setting of rapid afib;  b. 11/2012 Cath: nl EF, nonobs CAD->Med Rx;  b. 11/2012 Echo: EF 60-65%, mild LVH, mild MR/TR.  . Osteoarthritis   . PAF (paroxysmal atrial fibrillation) ([place])    a. Eliquis and amio initiated 11/2012 in setting of admission with RVR and NSTEMI;  b. Subsequently changed to flecainide and xarelto;  c. 11/2013 Echo: EF 60-65%, mild LVH, mod MR/TR.  Marland Kitchen Sinus brady-tachy syndrome ([place])    a. Post-conversion pause of 7.2 seconds during 11/2012 hospitalization @ [facility]; b. 04/2017 Event Monitor: No significant arrhythmias.    Objective:  General: Well developed, nourished, in no acute distress, alert and oriented x3   Dermatology: Skin is warm, dry and supple bilateral. Medial border of the right hallux appears to be erythematous with evidence of an ingrowing nail. Pain on palpation noted to the border of the nail fold. Nails are tender, long, thickened and dystrophic with subungual debris, consistent with onychomycosis, 1-5 bilateral. The remaining nails appear unremarkable at this time. There are no open sores,  lesions.  Vascular: Dorsalis Pedis artery and Posterior Tibial artery pedal pulses palpable. No lower extremity edema noted.   Neruologic: Grossly intact via light touch bilateral.  Musculoskeletal: Muscular strength within normal limits in all groups bilateral. Normal range of motion noted to all pedal and ankle joints.   Assesement: #1 Paronychia with ingrowing nail medial border right hallux  #2 Pain in toe #3 Incurvated nail #4 Onychodystrophic nails 1-5 bilateral with hyperkeratosis of nails.  #5 Onychomycosis of nail due to dermatophyte bilateral  Plan of Care:  1. Patient evaluated.  2. Instructed to maintain good pedal hygiene and foot care.  3. Mechanical debridement of nails 1-5 bilaterally performed using a nail nipper. Filed with dremel without incident.  4. Trimmed out ingrowing nail using a nail nipper.  5. Return to clinic in 3 months.   Edrick Kins, DPM [facility]  Dr. Edrick Kins, [address]                                        [place], [place] [address]                Office [phone]  Fax [phone]

## 2018-05-17 ENCOUNTER — Telehealth: Payer: Self-pay | Admitting: Internal Medicine

## 2018-05-17 NOTE — Telephone Encounter (Signed)
Copied from [place] [id number]. Topic: Quick Communication - See Telephone Encounter >> May 17, 2018 11:57 AM Blase Mess A wrote: CRM for notification. See Telephone encounter for: 05/17/18. [place] Reporting some symptoms.  Having increased urine slow, urgency, no symptoms uti, staff thinks it maybe incotnice.  They wanted medical advice.  Daughter in [place]. [phone]

## 2018-05-17 NOTE — Telephone Encounter (Signed)
Spoke to DIL Having some leakage of urine but no other symptoms No longer having routine care givers Discussed trying scheduled voids---meds not a good idea Also should use pad/diapers  Discussed hiring aides again to get her out to have some socialization and stimulation

## 2018-06-03 ENCOUNTER — Encounter (INDEPENDENT_AMBULATORY_CARE_PROVIDER_SITE_OTHER): Payer: Self-pay

## 2018-06-03 ENCOUNTER — Ambulatory Visit (INDEPENDENT_AMBULATORY_CARE_PROVIDER_SITE_OTHER): Payer: PPO | Admitting: Family Medicine

## 2018-06-03 ENCOUNTER — Encounter: Payer: Self-pay | Admitting: Family Medicine

## 2018-06-03 VITALS — BP 124/60 | HR 75 | Temp 98.4°F | Ht 64.0 in | Wt 137.7 lb

## 2018-06-03 DIAGNOSIS — W19XXXA Unspecified fall, initial encounter: Secondary | ICD-10-CM | POA: Diagnosis not present

## 2018-06-03 DIAGNOSIS — N3090 Cystitis, unspecified without hematuria: Secondary | ICD-10-CM

## 2018-06-03 DIAGNOSIS — R319 Hematuria, unspecified: Secondary | ICD-10-CM | POA: Diagnosis not present

## 2018-06-03 LAB — POC URINALSYSI DIPSTICK (AUTOMATED)
Bilirubin, UA: NEGATIVE
Glucose, UA: NEGATIVE
Ketones, UA: NEGATIVE
Nitrite, UA: POSITIVE
Protein, UA: POSITIVE — AB
Spec Grav, UA: 1.01 (ref 1.010–1.025)
Urobilinogen, UA: 0.2 E.U./dL
pH, UA: 6 (ref 5.0–8.0)

## 2018-06-03 MED ORDER — CEPHALEXIN 500 MG PO CAPS: 500.0000 mg | ORAL_CAPSULE | Freq: Two times a day (BID) | ORAL | 0 refills | Status: AC

## 2018-06-03 NOTE — Patient Instructions (Signed)
Good to see you today  I have sent in an antibiotic to your pharmacy  Drink enough liquid to make urine light yellow  If fever/chills, increased confusion, vomiting, please contact the office

## 2018-06-03 NOTE — Progress Notes (Signed)
Subjective:    Patient ID: Leah Hayden, female    DOB: 03-Jun-1928, 84 y.o.   MRN: [id number]  HPI This is an 84 yo female, accompanied by her daughter in law, who presents today with an episode of syncope or fall last week. The patient was staying alone and told caregivers that she was on floor and had incontinence. Not sure if she fell or passed out.  Has had some dark urine, with questionable blood in undergarments. Has been sleeping more than usual last couple of days, shaky, not as talkative as she usually is, decreased appetite. Drinks Gatorade, no water.  Someone has been staying with her at night since fall. Some nausea, has felt hot, no vomiting. No different or unusual back pain.   Past Medical History:  Diagnosis Date  . Allergic rhinitis   . Asthma   . Biceps tendon tear    a. right->s/p surgical repair winter of 2014.  . Diverticulosis of colon   . GERD (gastroesophageal reflux disease)   . Hammer toe of right foot   . Hx of colonic polyps   . HX: breast cancer   . Meniere's disease   . NSTEMI (non-ST elevated myocardial infarction) ([place])    a. 11/2012 in setting of rapid afib;  b. 11/2012 Cath: nl EF, nonobs CAD->Med Rx;  b. 11/2012 Echo: EF 60-65%, mild LVH, mild MR/TR.  . Osteoarthritis   . PAF (paroxysmal atrial fibrillation) ([place])    a. Eliquis and amio initiated 11/2012 in setting of admission with RVR and NSTEMI;  b. Subsequently changed to flecainide and xarelto;  c. 11/2013 Echo: EF 60-65%, mild LVH, mod MR/TR.  Marland Kitchen Sinus brady-tachy syndrome ([place])    a. Post-conversion pause of 7.2 seconds during 11/2012 hospitalization @ [facility]; b. 04/2017 Event Monitor: No significant arrhythmias.   Past Surgical History:  Procedure Laterality Date  . ABDOMINAL HYSTERECTOMY    . CARDIAC CATHETERIZATION  [facility]/2013   [facility]  . [place]   2nd to left toe  . ESOPHAGOGASTRODUODENOSCOPY  10/1999   inflammation only   . FOREIGN BODY REMOVAL Right 01/02/2013   Procedure:  FOREIGN BODY REMOVAL RIGHT INDEX FINGER;  Surgeon: Cammie Sickle., MD;  Location: [place];  Service: Orthopedics;  Laterality: Right;  . HAMMER TOE SURGERY  11/15   Dr Milinda Pointer  . MASTECTOMY  1984   left   . MASTECTOMY  1985   right  . PARTIAL HYSTERECTOMY  1966   w/ bladder tack  . ROTATOR CUFF REPAIR  10/09   right   . TOE SURGERY  2001/2002   2nd/3rd,left    Family History  Problem Relation Age of Onset  . Heart failure Mother   . Gout Mother   . Heart disease Mother        heart failure  . Arthritis Mother   . Heart failure Father   . Heart disease Father        heart failure  . Diabetes Maternal Grandmother   . Colon cancer Maternal Grandfather   . Cancer Maternal Grandfather        colon  . Arthritis Brother    Social History   Tobacco Use  . Smoking status: Never Smoker  . Smokeless tobacco: Never Used  Substance Use Topics  . Alcohol use: No    Alcohol/week: 0.0 standard drinks  . Drug use: No      Review of Systems Per HPI  Objective:   Physical Exam  Constitutional: She appears well-developed and well-nourished.  HENT:  Head: Normocephalic and atraumatic.  Eyes: Conjunctivae are normal.  Cardiovascular: Normal rate, regular rhythm and normal heart sounds.  Pulmonary/Chest: Effort normal and breath sounds normal.  Abdominal: Soft. She exhibits no distension. There is no tenderness. There is no rigidity, no rebound, no guarding and no CVA tenderness.  Neurological: She is alert.  Answers questions appropriately.   Skin: Skin is warm and dry.  Psychiatric: She has a normal mood and affect. Her behavior is normal. Judgment and thought content normal.  Vitals reviewed.     BP 124/60 (BP Location: Left Arm, Patient Position: Sitting, Cuff Size: Normal)   Pulse 75   Temp 98.4 F (36.9 C) (Oral)   Ht 5\' 4"  (1.626 m)   Wt 137 lb 11.2 oz (62.5 kg)   SpO2 96%   BMI 23.64 kg/m  Wt Readings from Last 3 Encounters:  06/03/18  137 lb 11.2 oz (62.5 kg)  04/24/18 131 lb (59.4 kg)  04/22/18 130 lb 4 oz (59.1 kg)       Assessment & Plan:  1. Hematuria, unspecified type - POCT Urinalysis Dipstick (Automated) - Urine Culture  2. Cystitis - Encouraged increased fluid intake, including water - certainly could be contributing to her fatigue, anorexia, discussed with daughter in law that if patient does not improve in next 24-48 hours with antibiotics or if she gets worse, needs to follow up immediately.  - cephALEXin (KEFLEX) 500 MG capsule; Take 1 capsule (500 mg total) by mouth 2 (two) times daily for 7 days.  Dispense: 14 capsule; Refill: 0  3. Fall, initial encounter - unsure of circumstances, encouraged family to not leave her unattended - patient denies any pain/swelling or bruising  - she has follow up on file with Dr. [name] in a couple of weeks  Clarene Reamer, FNP-BC  [facility], [place]  06/04/2018 9:15 PM

## 2018-06-04 ENCOUNTER — Encounter: Payer: Self-pay | Admitting: Family Medicine

## 2018-06-05 ENCOUNTER — Other Ambulatory Visit: Payer: Self-pay | Admitting: Cardiovascular Disease

## 2018-06-05 LAB — URINE CULTURE
MICRO NUMBER:: 91262286
SPECIMEN QUALITY:: ADEQUATE

## 2018-06-05 NOTE — Telephone Encounter (Signed)
Please review for refill. Thanks!  

## 2018-06-07 ENCOUNTER — Encounter: Payer: Self-pay | Admitting: Internal Medicine

## 2018-06-07 ENCOUNTER — Ambulatory Visit (INDEPENDENT_AMBULATORY_CARE_PROVIDER_SITE_OTHER): Payer: PPO | Admitting: Internal Medicine

## 2018-06-07 VITALS — BP 138/76 | HR 65 | Temp 98.1°F | Ht 64.0 in | Wt 138.0 lb

## 2018-06-07 DIAGNOSIS — F0150 Vascular dementia without behavioral disturbance: Secondary | ICD-10-CM

## 2018-06-07 NOTE — Assessment & Plan Note (Signed)
I think she may have had another vascular event  More confused Likely just asymptomatic bacteriuria--but will finish out the antibiotic (50-100K E Coli) Back to 24/7 aide help---I think she needs this indefinitely

## 2018-06-07 NOTE — Progress Notes (Signed)
Subjective:    Patient ID: Leah Hayden, female    DOB: 02-07-1928, 84 y.o.   MRN: [id number]  HPI Here with DIL Was seen 4 days ago for urine symptoms Some increased confusion--that may be finally getting some better Initially seemed much better the day after starting the antibiotics---but then more confusion since then  Ongoing incontinence--not new this week Had clear increased frequency with this and all this started with fall onto the floor after inontinence No dysuria but DIL did see some possible blood in underwear Trouble getting dressed at times  Had gone back to staying on her own Now back to help at home---family and hired help  Current Outpatient Medications on File Prior to Visit  Medication Sig Dispense Refill  . albuterol (PROVENTIL HFA) 108 (90 BASE) MCG/ACT inhaler Inhale 2 puffs into the lungs every 6 (six) hours as needed.      . cephALEXin (KEFLEX) 500 MG capsule Take 1 capsule (500 mg total) by mouth 2 (two) times daily for 7 days. 14 capsule 0  . cholecalciferol (VITAMIN D) 1000 units tablet Take 5,000 Units by mouth daily.    . cyanocobalamin 500 MCG tablet Take 500 mcg by mouth every other day.     . diltiazem (CARDIZEM) 30 MG tablet Take 1 tablet (30 mg) by mouth every 6-8 hours as needed for breakthrough a-fib 30 tablet 1  . donepezil (ARICEPT) 5 MG tablet TAKE 1 TABLET (5 MG TOTAL) BY MOUTH AT BEDTIME. 90 tablet 3  . Fluticasone-Salmeterol (ADVAIR DISKUS) 100-50 MCG/DOSE AEPB Inhale 1 puff into the lungs every 12 (twelve) hours.      Marland Kitchen losartan (COZAAR) 50 MG tablet Take 1 tablet (50 mg total) by mouth daily. 30 tablet 5  . montelukast (SINGULAIR) 10 MG tablet Take 10 mg by mouth every evening.     . Multiple Vitamins-Minerals (CENTRUM SILVER 50+WOMEN) TABS Take 1 tablet by mouth daily.    . nitroGLYCERIN (NITROSTAT) 0.4 MG SL tablet Place 1 tablet (0.4 mg total) under the tongue every 5 (five) minutes as needed for chest pain. 25 tablet 1  . XARELTO 20 MG  TABS tablet TAKE 1 TABLET BY MOUTH EVERY DAY WITH SUPPER 30 tablet 3   No current facility-administered medications on file prior to visit.     Allergies  Allergen Reactions  . Amiodarone Other (See Comments)    Chest discomfort  . Other Rash    oramycin    Past Medical History:  Diagnosis Date  . Allergic rhinitis   . Asthma   . Biceps tendon tear    a. right->s/p surgical repair winter of 2014.  . Diverticulosis of colon   . GERD (gastroesophageal reflux disease)   . Hammer toe of right foot   . Hx of colonic polyps   . HX: breast cancer   . Meniere's disease   . NSTEMI (non-ST elevated myocardial infarction) ([place])    a. 11/2012 in setting of rapid afib;  b. 11/2012 Cath: nl EF, nonobs CAD->Med Rx;  b. 11/2012 Echo: EF 60-65%, mild LVH, mild MR/TR.  . Osteoarthritis   . PAF (paroxysmal atrial fibrillation) ([place])    a. Eliquis and amio initiated 11/2012 in setting of admission with RVR and NSTEMI;  b. Subsequently changed to flecainide and xarelto;  c. 11/2013 Echo: EF 60-65%, mild LVH, mod MR/TR.  Marland Kitchen Sinus brady-tachy syndrome ([place])    a. Post-conversion pause of 7.2 seconds during 11/2012 hospitalization @ [facility]; b. 04/2017 Event Monitor: No  significant arrhythmias.    Past Surgical History:  Procedure Laterality Date  . ABDOMINAL HYSTERECTOMY    . CARDIAC CATHETERIZATION  11/2013   [facility]  . [place]   2nd to left toe  . ESOPHAGOGASTRODUODENOSCOPY  10/1999   inflammation only   . FOREIGN BODY REMOVAL Right 01/02/2013   Procedure: FOREIGN BODY REMOVAL RIGHT INDEX FINGER;  Surgeon: Cammie Sickle., MD;  Location: [place];  Service: Orthopedics;  Laterality: Right;  . HAMMER TOE SURGERY  11/15   Dr Milinda Pointer  . MASTECTOMY  1984   left   . MASTECTOMY  1985   right  . PARTIAL HYSTERECTOMY  1966   w/ bladder tack  . ROTATOR CUFF REPAIR  10/09   right   . TOE SURGERY  2001/2002   2nd/3rd,left     Family History  Problem Relation Age of  Onset  . Heart failure Mother   . Gout Mother   . Heart disease Mother        heart failure  . Arthritis Mother   . Heart failure Father   . Heart disease Father        heart failure  . Diabetes Maternal Grandmother   . Colon cancer Maternal Grandfather   . Cancer Maternal Grandfather        colon  . Arthritis Brother     Social History   Socioeconomic History  . Marital status: Widowed    Spouse name: Not on file  . Number of children: 2  . Years of education: Not on file  . Highest education level: Not on file  Occupational History  . 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 Financial resource strain: Not on file  . Food insecurity:    Worry: Not on file    Inability: Not on file  . Transportation needs:    Medical: Not on file    Non-medical: Not on file  Tobacco Use  . Smoking status: Never Smoker  . Smokeless tobacco: Never Used  Substance and Sexual Activity  . Alcohol use: No    Alcohol/week: 0.0 standard drinks  . Drug use: No  . Sexual activity: Not on file  Lifestyle  . Physical activity:    Days per week: Not on file    Minutes per session: Not on file  . Stress: Not on file  Relationships  . Social connections:    Talks on phone: Not on file    Gets together: Not on file    Attends religious service: Not on file    Active member of club or organization: Not on file    Attends meetings of clubs or organizations: Not on file    Relationship status: Not on file  . Intimate partner violence:    Fear of current or ex partner: Not on file    Emotionally abused: Not on file    Physically abused: Not on file    Forced sexual activity: Not on file  Other Topics Concern  . Not on file  Social History Narrative   Now has living will   Sons are health care POAs   Has DNR   No tube feeds if cognitively unaware   Review of Systems No clear fever---??felt a little hot to DIL No N/V Appetite was down---better  now    Objective:   Physical Exam  Constitutional: She appears well-developed. No distress.  Neck: No  thyromegaly present.  Cardiovascular: Normal rate, regular rhythm and normal heart sounds. Exam reveals no gallop.  No murmur heard. Respiratory: Effort normal and breath sounds normal. No respiratory distress. She has no wheezes. She has no rales.  GI: Soft. She exhibits no distension. There is no tenderness. There is no rebound and no guarding.  Musculoskeletal: She exhibits no edema.  Lymphadenopathy:    She has no cervical adenopathy.  Neurological:  Blank look--does respond to questions with appropriate answers but some psychomotor retardation           Assessment & Plan:

## 2018-06-17 ENCOUNTER — Telehealth: Payer: Self-pay

## 2018-06-17 NOTE — Telephone Encounter (Signed)
Spoke to Leah Hayden. She said she will follow her this week and if anything needs to be changed before her appt, she will call to move it up

## 2018-06-17 NOTE — Telephone Encounter (Signed)
[name] (DPR signed) said that pt gets belligerent and may not eat or drink as she should. This started end of last week. [name] is concerned that pt is depressed. Pt scheduled for CPX on 06/26/18 and [name] is not sure can wait until 06/26/18 to talk about pts indifference. Pt has good days and bad days but if someone does not let pt do what she wants to do she gets indifferent and wont eat. [name] wonders if something can be given to pt to see if would help the depression prior to 06/26/18 appt. [facility]. [name] request cb after reviews note.pt last seen 06/07/18.

## 2018-06-17 NOTE — Telephone Encounter (Signed)
Any medications for depression have significant chance for side effects---like increased risk of falls. I would rather wait to see her---push up appt if they can't wait for next week

## 2018-06-26 ENCOUNTER — Ambulatory Visit (INDEPENDENT_AMBULATORY_CARE_PROVIDER_SITE_OTHER): Payer: PPO | Admitting: Internal Medicine

## 2018-06-26 ENCOUNTER — Encounter: Payer: Self-pay | Admitting: Internal Medicine

## 2018-06-26 VITALS — BP 132/84 | HR 66 | Temp 98.2°F | Ht 65.0 in | Wt 136.0 lb

## 2018-06-26 DIAGNOSIS — Z0000 Encounter for general adult medical examination without abnormal findings: Secondary | ICD-10-CM

## 2018-06-26 DIAGNOSIS — I480 Paroxysmal atrial fibrillation: Secondary | ICD-10-CM

## 2018-06-26 DIAGNOSIS — N1832 Chronic kidney disease, stage 3b: Secondary | ICD-10-CM | POA: Insufficient documentation

## 2018-06-26 DIAGNOSIS — F0150 Vascular dementia without behavioral disturbance: Secondary | ICD-10-CM

## 2018-06-26 DIAGNOSIS — Z7189 Other specified counseling: Secondary | ICD-10-CM | POA: Diagnosis not present

## 2018-06-26 DIAGNOSIS — N183 Chronic kidney disease, stage 3 unspecified: Secondary | ICD-10-CM

## 2018-06-26 DIAGNOSIS — F39 Unspecified mood [affective] disorder: Secondary | ICD-10-CM

## 2018-06-26 NOTE — Progress Notes (Signed)
Subjective:    Patient ID: Leah Hayden, female    DOB: 1927-12-09, 84 y.o.   MRN: [id number]  HPI Here for Medicare wellness with DIL and follow up of chronic health conditions Reviewed form and advanced directives Reviewed other doctors No tobacco or alcohol Tries to walk some Did have 1 fall--no injury Vision is okay---needs large print Hearing is not so good--discussed considering hearing aides Some mood issues--- some depression with aides coming in but better now. Will have to watch with the weather and being stuck inside. Not anhedonic---especially enjoys grandkids and great grandkids Ongoing memory issues  Is better neurologically Some days better than others Did have some days of being indifferent and not caring about things May have been related to aide not allowing her to do anything Did start with 24/7 care but now down to  aides 8-3 They are trying to keep her as independent as possible with instrumental ADLs Doesn't drive  Uses the bathroom herself---but has diaper for some urge incontinence No dysuria or hematuria  No palpitations No chest pain or SOB Is using cane for balance at times---has to be careful standing up No syncope  Reviewed blood work GFR consistently in the high 50's  Current Outpatient Medications on File Prior to Visit  Medication Sig Dispense Refill  . albuterol (PROVENTIL HFA) 108 (90 BASE) MCG/ACT inhaler Inhale 2 puffs into the lungs every 6 (six) hours as needed.      . cholecalciferol (VITAMIN D) 1000 units tablet Take 5,000 Units by mouth daily.    . cyanocobalamin 500 MCG tablet Take 500 mcg by mouth every other day.     . diltiazem (CARDIZEM) 30 MG tablet Take 1 tablet (30 mg) by mouth every 6-8 hours as needed for breakthrough a-fib 30 tablet 1  . donepezil (ARICEPT) 5 MG tablet TAKE 1 TABLET (5 MG TOTAL) BY MOUTH AT BEDTIME. 90 tablet 3  . Fluticasone-Salmeterol (ADVAIR DISKUS) 100-50 MCG/DOSE AEPB Inhale 1 puff into the lungs  every 12 (twelve) hours.      . montelukast (SINGULAIR) 10 MG tablet Take 10 mg by mouth every evening.     . Multiple Vitamins-Minerals (CENTRUM SILVER 50+WOMEN) TABS Take 1 tablet by mouth daily.    . nitroGLYCERIN (NITROSTAT) 0.4 MG SL tablet Place 1 tablet (0.4 mg total) under the tongue every 5 (five) minutes as needed for chest pain. 25 tablet 1  . XARELTO 20 MG TABS tablet TAKE 1 TABLET BY MOUTH EVERY DAY WITH SUPPER 30 tablet 3  . losartan (COZAAR) 50 MG tablet Take 1 tablet (50 mg total) by mouth daily. 30 tablet 5   No current facility-administered medications on file prior to visit.     Allergies  Allergen Reactions  . Amiodarone Other (See Comments)    Chest discomfort  . Other Rash    oramycin    Past Medical History:  Diagnosis Date  . Allergic rhinitis   . Asthma   . Biceps tendon tear    a. right->s/p surgical repair winter of 2014.  . Diverticulosis of colon   . GERD (gastroesophageal reflux disease)   . Hammer toe of right foot   . Hx of colonic polyps   . HX: breast cancer   . Meniere's disease   . NSTEMI (non-ST elevated myocardial infarction) ([place])    a. 11/2012 in setting of rapid afib;  b. 11/2012 Cath: nl EF, nonobs CAD->Med Rx;  b. 11/2012 Echo: EF 60-65%, mild LVH, mild MR/TR.  Marland Kitchen  Osteoarthritis   . PAF (paroxysmal atrial fibrillation) ([place])    a. Eliquis and amio initiated 11/2012 in setting of admission with RVR and NSTEMI;  b. Subsequently changed to flecainide and xarelto;  c. 11/2013 Echo: EF 60-65%, mild LVH, mod MR/TR.  Marland Kitchen Sinus brady-tachy syndrome ([place])    a. Post-conversion pause of 7.2 seconds during 11/2012 hospitalization @ [facility]; b. 04/2017 Event Monitor: No significant arrhythmias.    Past Surgical History:  Procedure Laterality Date  . ABDOMINAL HYSTERECTOMY    . CARDIAC CATHETERIZATION  11/2013   [facility]  . [place]   2nd to left toe  . ESOPHAGOGASTRODUODENOSCOPY  10/1999   inflammation only   . FOREIGN BODY REMOVAL Right  01/02/2013   Procedure: FOREIGN BODY REMOVAL RIGHT INDEX FINGER;  Surgeon: Cammie Sickle., MD;  Location: [facility];  Service: Orthopedics;  Laterality: Right;  . HAMMER TOE SURGERY  11/15   Dr Milinda Pointer  . MASTECTOMY  1984   left   . MASTECTOMY  1985   right  . PARTIAL HYSTERECTOMY  1966   w/ bladder tack  . ROTATOR CUFF REPAIR  10/09   right   . TOE SURGERY  2001/2002   2nd/3rd,left     Family History  Problem Relation Age of Onset  . Heart failure Mother   . Gout Mother   . Heart disease Mother        heart failure  . Arthritis Mother   . Heart failure Father   . Heart disease Father        heart failure  . Diabetes Maternal Grandmother   . Colon cancer Maternal Grandfather   . Cancer Maternal Grandfather        colon  . Arthritis Brother     Social History   Socioeconomic History  . Marital status: Widowed    Spouse name: Not on file  . Number of children: 2  . Years of education: Not on file  . Highest education level: Not on file  Occupational History  . 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 Financial resource strain: Not on file  . Food insecurity:    Worry: Not on file    Inability: Not on file  . Transportation needs:    Medical: Not on file    Non-medical: Not on file  Tobacco Use  . Smoking status: Never Smoker  . Smokeless tobacco: Never Used  Substance and Sexual Activity  . Alcohol use: No    Alcohol/week: 0.0 standard drinks  . Drug use: No  . Sexual activity: Not on file  Lifestyle  . Physical activity:    Days per week: Not on file    Minutes per session: Not on file  . Stress: Not on file  Relationships  . Social connections:    Talks on phone: Not on file    Gets together: Not on file    Attends religious service: Not on file    Active member of club or organization: Not on file    Attends meetings of clubs or organizations: Not on file    Relationship status: Not on  file  . Intimate partner violence:    Fear of current or ex partner: Not on file    Emotionally abused: Not on file    Physically abused: Not on file    Forced sexual activity: Not on file  Other Topics Concern  .  Not on file  Social History Narrative   Now has living will   Sons are health care POAs   Has DNR   No tube feeds if cognitively unaware   Review of Systems Appetite is fair Weight is holding Sleeps well Wears seat belt Teeth okay--keeps up with dentist No suspicious skin lesions---see derm No heartburn or dysphagia Some leg pain--generally won't take tylenol (but told her it would be okay) Bowels are fine--no blood    Objective:   Physical Exam  Constitutional: She appears well-developed. No distress.  HENT:  Mouth/Throat: Oropharynx is clear and moist. No oropharyngeal exudate.  Neck: No thyromegaly present.  Cardiovascular: Normal rate and regular rhythm.  occ skips Soft systolic murmur  Respiratory: Effort normal and breath sounds normal. No respiratory distress. She has no wheezes. She has no rales.  GI: Soft. There is no tenderness.  Musculoskeletal: She exhibits no edema or tenderness.  Lymphadenopathy:    She has no cervical adenopathy.  Skin: No rash noted. No erythema.  Psychiatric: She has a normal mood and affect. Her behavior is normal.           Assessment & Plan:

## 2018-06-26 NOTE — Assessment & Plan Note (Signed)
Borderline low function Is on ARB

## 2018-06-26 NOTE — Assessment & Plan Note (Signed)
Has bounced back some Aide assistance part of every day On xarelto  donepezil

## 2018-06-26 NOTE — Assessment & Plan Note (Signed)
Has DNR 

## 2018-06-26 NOTE — Assessment & Plan Note (Signed)
Mostly adjustment process with declining function and need for aides Discussed behavioral measures---- getting out, exercise, etc Would like to avoid antidepressants----fall risk, etc Discussed signs of MDD with DIL--to consider meds then

## 2018-06-26 NOTE — Assessment & Plan Note (Signed)
Regular now On xarelto 

## 2018-06-26 NOTE — Progress Notes (Signed)
Hearing Screening (Inadequate exam)   Method: Audiometry   125Hz  250Hz  500Hz  1000Hz  2000Hz  3000Hz  4000Hz  6000Hz  8000Hz   Right ear:   20 0 0  0    Left ear:   20 20 0  0    Vision Screening Comments: July 2019

## 2018-06-26 NOTE — Assessment & Plan Note (Signed)
I have personally reviewed the Medicare Annual Wellness questionnaire and have noted 1. The patient's medical and social history 2. Their use of alcohol, tobacco or illicit drugs 3. Their current medications and supplements 4. The patient's functional ability including ADL's, fall risks, home safety risks and hearing or visual             impairment. 5. Diet and physical activities 6. Evidence for depression or mood disorders  The patients weight, height, BMI and visual acuity have been recorded in the chart I have made referrals, counseling and provided education to the patient based review of the above and I have provided the pt with a written personalized care plan for preventive services.  I have provided you with a copy of your personalized plan for preventive services. Please take the time to review along with your updated medication list.  Yearly flu vaccine No cancer screening Discussed exercise

## 2018-07-01 ENCOUNTER — Emergency Department
Admission: EM | Admit: 2018-07-01 | Discharge: 2018-07-01 | Disposition: A | Discharge status: Home / Self Care | Payer: PPO | Attending: Emergency Medicine | Admitting: Emergency Medicine

## 2018-07-01 ENCOUNTER — Emergency Department: Payer: PPO

## 2018-07-01 ENCOUNTER — Encounter: Payer: Self-pay | Admitting: Emergency Medicine

## 2018-07-01 ENCOUNTER — Other Ambulatory Visit: Payer: Self-pay

## 2018-07-01 DIAGNOSIS — Z7901 Long term (current) use of anticoagulants: Secondary | ICD-10-CM | POA: Diagnosis not present

## 2018-07-01 DIAGNOSIS — N183 Chronic kidney disease, stage 3 (moderate): Secondary | ICD-10-CM | POA: Insufficient documentation

## 2018-07-01 DIAGNOSIS — J45909 Unspecified asthma, uncomplicated: Secondary | ICD-10-CM | POA: Insufficient documentation

## 2018-07-01 DIAGNOSIS — Z853 Personal history of malignant neoplasm of breast: Secondary | ICD-10-CM | POA: Insufficient documentation

## 2018-07-01 DIAGNOSIS — R531 Weakness: Secondary | ICD-10-CM | POA: Diagnosis not present

## 2018-07-01 DIAGNOSIS — Z79899 Other long term (current) drug therapy: Secondary | ICD-10-CM | POA: Insufficient documentation

## 2018-07-01 DIAGNOSIS — N390 Urinary tract infection, site not specified: Secondary | ICD-10-CM | POA: Insufficient documentation

## 2018-07-01 DIAGNOSIS — M6281 Muscle weakness (generalized): Secondary | ICD-10-CM | POA: Diagnosis not present

## 2018-07-01 LAB — CBC WITH DIFFERENTIAL/PLATELET
Abs Immature Granulocytes: 0.02 10*3/uL (ref 0.00–0.07)
Basophils Absolute: 0 10*3/uL (ref 0.0–0.1)
Basophils Relative: 0 %
Eosinophils Absolute: 0.1 10*3/uL (ref 0.0–0.5)
Eosinophils Relative: 2 %
HCT: 39.1 % (ref 36.0–46.0)
Hemoglobin: 12.9 g/dL (ref 12.0–15.0)
Immature Granulocytes: 0 %
Lymphocytes Relative: 26 %
Lymphs Abs: 1.4 10*3/uL (ref 0.7–4.0)
MCH: 31 pg (ref 26.0–34.0)
MCHC: 33 g/dL (ref 30.0–36.0)
MCV: 94 fL (ref 80.0–100.0)
Monocytes Absolute: 0.4 10*3/uL (ref 0.1–1.0)
Monocytes Relative: 7 %
Neutro Abs: 3.5 10*3/uL (ref 1.7–7.7)
Neutrophils Relative %: 65 %
Platelets: 170 10*3/uL (ref 150–400)
RBC: 4.16 MIL/uL (ref 3.87–5.11)
RDW: 13 % (ref 11.5–15.5)
WBC: 5.5 10*3/uL (ref 4.0–10.5)
nRBC: 0 % (ref 0.0–0.2)

## 2018-07-01 LAB — COMPREHENSIVE METABOLIC PANEL
ALT: 19 U/L (ref 0–44)
AST: 30 U/L (ref 15–41)
Albumin: 3.8 g/dL (ref 3.5–5.0)
Alkaline Phosphatase: 66 U/L (ref 38–126)
Anion gap: 9 (ref 5–15)
BUN: 26 mg/dL — ABNORMAL HIGH (ref 8–23)
CO2: 28 mmol/L (ref 22–32)
Calcium: 9.5 mg/dL (ref 8.9–10.3)
Chloride: 104 mmol/L (ref 98–111)
Creatinine, Ser: 0.97 mg/dL (ref 0.44–1.00)
GFR calc Af Amer: 59 mL/min — ABNORMAL LOW (ref >60)
GFR calc non Af Amer: 51 mL/min — ABNORMAL LOW (ref >60)
Glucose, Bld: 108 mg/dL — ABNORMAL HIGH (ref 70–99)
Potassium: 4 mmol/L (ref 3.5–5.1)
Sodium: 141 mmol/L (ref 135–145)
Total Bilirubin: 1.3 mg/dL — ABNORMAL HIGH (ref 0.3–1.2)
Total Protein: 6.6 g/dL (ref 6.5–8.1)

## 2018-07-01 LAB — URINALYSIS, COMPLETE (UACMP) WITH MICROSCOPIC
Bilirubin Urine: NEGATIVE
Glucose, UA: NEGATIVE mg/dL
Hgb urine dipstick: NEGATIVE
Ketones, ur: NEGATIVE mg/dL
Nitrite: POSITIVE — AB
Protein, ur: NEGATIVE mg/dL
Specific Gravity, Urine: 1.01 (ref 1.005–1.030)
Squamous Epithelial / LPF: NONE SEEN (ref 0–5)
pH: 8 (ref 5.0–8.0)

## 2018-07-01 LAB — PROTIME-INR
INR: 1.2
Prothrombin Time: 15.1 seconds (ref 11.4–15.2)

## 2018-07-01 LAB — TROPONIN I: Troponin I: <0.03 ng/mL (ref <0.03)

## 2018-07-01 MED ORDER — SODIUM CHLORIDE 0.9 % IV SOLN
1.0000 g | Freq: Once | INTRAVENOUS | Status: AC
  Administered 2018-07-01: 1 g via INTRAVENOUS
  Filled 2018-07-01: qty 10

## 2018-07-01 MED ORDER — CEPHALEXIN 500 MG PO CAPS: 500.0000 mg | ORAL_CAPSULE | Freq: Two times a day (BID) | ORAL | 0 refills | Status: AC

## 2018-07-01 NOTE — ED Notes (Signed)
Pt was able to ambulate to toilet with minimal assistance.

## 2018-07-01 NOTE — ED Provider Notes (Signed)
[facility] Emergency Department Provider Note ____________________________________________   First MD Initiated Contact with Patient 07/01/18 [id number]     (approximate)  I have reviewed the triage vital signs and the nursing notes.   HISTORY  Chief Complaint Weakness    HPI Leah Hayden is a 84 y.o. female with PMH as noted below who presents with generalized weakness, acute onset when she awoke today, persistent course, and not associated with any focal symptoms.  She does report that she is somewhat dizzy.  She denies any pain, fever, vomiting or shortness of breath.  She states she felt well yesterday.  Past Medical History:  Diagnosis Date  . Allergic rhinitis   . Asthma   . Biceps tendon tear    a. right->s/p surgical repair winter of 2014.  . Diverticulosis of colon   . GERD (gastroesophageal reflux disease)   . Hammer toe of right foot   . Hx of colonic polyps   . HX: breast cancer   . Meniere's disease   . NSTEMI (non-ST elevated myocardial infarction) ([place])    a. 11/2012 in setting of rapid afib;  b. 11/2012 Cath: nl EF, nonobs CAD->Med Rx;  b. 11/2012 Echo: EF 60-65%, mild LVH, mild MR/TR.  . Osteoarthritis   . PAF (paroxysmal atrial fibrillation) ([place])    a. Eliquis and amio initiated 11/2012 in setting of admission with RVR and NSTEMI;  b. Subsequently changed to flecainide and xarelto;  c. 11/2013 Echo: EF 60-65%, mild LVH, mod MR/TR.  Marland Kitchen Sinus brady-tachy syndrome ([place])    a. Post-conversion pause of 7.2 seconds during 11/2012 hospitalization @ [facility]; b. 04/2017 Event Monitor: No significant arrhythmias.    Patient Active Problem List   Diagnosis Date Noted  . Mood disorder ([place]) 06/26/2018  . Chronic kidney disease (CKD), stage III (moderate) ([place]) 06/26/2018  . Arrhythmia 04/12/2018  . Vitamin B12 deficiency 11/10/2015  . Vascular dementia, uncomplicated ([place]) 67/61/9509  . Advance directive discussed with patient 06/07/2015  .  Hyperlipidemia 05/13/2014  . PAF (paroxysmal atrial fibrillation) ([place])   . Routine general medical examination at a health care facility 05/03/2012  . SVT/ PSVT/ PAT 01/10/2010  . Sick sinus syndrome ([place]) 01/10/2010  . Generalized osteoarthrosis, involving multiple sites 12/18/2007  . ALLERGIC RHINITIS 03/07/2007  . Mild intermittent asthma in adult without complication 32/67/1245  . GERD 03/07/2007  . DIVERTICULOSIS, COLON 03/07/2007  . BREAST CANCER, HX OF 03/07/2007    Past Surgical History:  Procedure Laterality Date  . ABDOMINAL HYSTERECTOMY    . CARDIAC CATHETERIZATION  11/2013   [facility]  . [place]   2nd to left toe  . ESOPHAGOGASTRODUODENOSCOPY  10/1999   inflammation only   . FOREIGN BODY REMOVAL Right 01/02/2013   Procedure: FOREIGN BODY REMOVAL RIGHT INDEX FINGER;  Surgeon: Cammie Sickle., MD;  Location: [place];  Service: Orthopedics;  Laterality: Right;  . HAMMER TOE SURGERY  11/15   Dr Milinda Pointer  . MASTECTOMY  1984   left   . MASTECTOMY  1985   right  . PARTIAL HYSTERECTOMY  1966   w/ bladder tack  . ROTATOR CUFF REPAIR  10/09   right   . TOE SURGERY  2001/2002   2nd/3rd,left     Prior to Admission medications   Medication Sig Start Date End Date Taking? Authorizing Provider  albuterol (PROVENTIL HFA) 108 (90 BASE) MCG/ACT inhaler Inhale 2 puffs into the lungs every 6 (six) hours as  needed.     Yes [provider]  cholecalciferol (VITAMIN D) 1000 units tablet Take 5,000 Units by mouth daily.   Yes [provider]  cyanocobalamin 500 MCG tablet Take 500 mcg by mouth every other day.    Yes [provider]  diltiazem (CARDIZEM) 30 MG tablet Take 1 tablet (30 mg) by mouth every 6-8 hours as needed for breakthrough a-fib 04/19/18  Yes Gollan, Kathlene November, MD  donepezil (ARICEPT) 5 MG tablet TAKE 1 TABLET (5 MG TOTAL) BY MOUTH AT BEDTIME. 02/08/18  Yes Venia Carbon, MD  Fluticasone-Salmeterol (ADVAIR  DISKUS) 100-50 MCG/DOSE AEPB Inhale 1 puff into the lungs every 12 (twelve) hours.     Yes [provider]  losartan (COZAAR) 50 MG tablet Take 1 tablet (50 mg total) by mouth daily. 05/14/18 07/01/18 Yes Gollan, Kathlene November, MD  montelukast (SINGULAIR) 10 MG tablet Take 10 mg by mouth every evening.    Yes [provider]  Multiple Vitamins-Minerals (CENTRUM SILVER 50+WOMEN) TABS Take 1 tablet by mouth daily.   Yes [provider]  nitroGLYCERIN (NITROSTAT) 0.4 MG SL tablet Place 1 tablet (0.4 mg total) under the tongue every 5 (five) minutes as needed for chest pain. 02/22/18  Yes Gollan, Kathlene November, MD  XARELTO 20 MG TABS tablet TAKE 1 TABLET BY MOUTH EVERY DAY WITH SUPPER Patient taking differently: Take 20 mg by mouth daily with supper.  06/05/18  Yes Gollan, Kathlene November, MD  cephALEXin (KEFLEX) 500 MG capsule Take 1 capsule (500 mg total) by mouth 2 (two) times daily for 10 days. 07/01/18 07/11/18  Arta Silence, MD    Allergies Amiodarone and Other  Family History  Problem Relation Age of Onset  . Heart failure Mother   . Gout Mother   . Heart disease Mother        heart failure  . Arthritis Mother   . Heart failure Father   . Heart disease Father        heart failure  . Diabetes Maternal Grandmother   . Colon cancer Maternal Grandfather   . Cancer Maternal Grandfather        colon  . Arthritis Brother     Social History Social History   Tobacco Use  . Smoking status: Never Smoker  . Smokeless tobacco: Never Used  Substance Use Topics  . Alcohol use: No    Alcohol/week: 0.0 standard drinks  . Drug use: No    Review of Systems  Constitutional: No fever.  Positive for weakness. Eyes: No redness. ENT: No sore throat. Cardiovascular: Denies chest pain. Respiratory: Denies shortness of breath. Gastrointestinal: No vomiting or diarrhea.  Genitourinary: Negative for dysuria or frequency.  Musculoskeletal: Negative for back pain. Skin:  Negative for rash. Neurological: Negative for headaches, focal weakness or numbness.    ____________________________________________   PHYSICAL EXAM:  VITAL SIGNS: ED Triage Vitals [07/01/18 0907]  Enc Vitals Group     BP (!) 180/82     Pulse Rate 64     Resp 20     Temp 98.1 F (36.7 C)     Temp Source Oral     SpO2 98 %     Weight 138 lb (62.6 kg)     Height 5\' 4"  (1.626 m)     Head Circumference      Peak Flow      Pain Score 0     Pain Loc      Pain Edu?  Excl. in [place]?     Constitutional: Alert and oriented.  Slightly weak appearing but in no acute distress. Eyes: Conjunctivae are normal.  EOMI.  PERRLA. Head: Atraumatic. Nose: No congestion/rhinnorhea. Mouth/Throat: Mucous membranes are somewhat dry.   Neck: Normal range of motion.  Cardiovascular: Normal rate, regular rhythm. Grossly normal heart sounds.  Good peripheral circulation. Respiratory: Normal respiratory effort.  No retractions. Lungs CTAB. Gastrointestinal: Soft and nontender. No distention.  Genitourinary: No flank tenderness. Musculoskeletal: No lower extremity edema.  Extremities warm and well perfused.  Neurologic:  Normal speech and language.  Motor intact in all extremities.  Normal coordination.  No gross focal neurologic deficits are appreciated.  Skin:  Skin is warm and dry. No rash noted. Psychiatric: Mood and affect are normal. Speech and behavior are normal.  ____________________________________________   LABS (all labs ordered are listed, but only abnormal results are displayed)  Labs Reviewed  COMPREHENSIVE METABOLIC PANEL - Abnormal; Notable for the following components:      Result Value   Glucose, Bld 108 (*)    BUN 26 (*)    Total Bilirubin 1.3 (*)    GFR calc non Af Amer 51 (*)    GFR calc Af Amer 59 (*)    All other components within normal limits  URINALYSIS, COMPLETE (UACMP) WITH MICROSCOPIC - Abnormal; Notable for the following components:   Color, Urine YELLOW (*)     APPearance HAZY (*)    Nitrite POSITIVE (*)    Leukocytes, UA SMALL (*)    Bacteria, UA RARE (*)    All other components within normal limits  CBC WITH DIFFERENTIAL/PLATELET  TROPONIN I  PROTIME-INR   ____________________________________________  EKG  ED ECG REPORT I, Arta Silence, the attending physician, personally viewed and interpreted this ECG.  Date: 07/01/2018 EKG Time: 906 Rate: 64 Rhythm: normal sinus rhythm QRS Axis: normal Intervals: normal ST/T Wave abnormalities: normal Narrative Interpretation: no evidence of acute ischemia  ____________________________________________  RADIOLOGY  CXR: No focal infiltrate  ____________________________________________   PROCEDURES  Procedure(s) performed: No  Procedures  Critical Care performed: No ____________________________________________   INITIAL IMPRESSION / ASSESSMENT AND PLAN / ED COURSE  Pertinent labs & imaging results that were available during my care of the patient were reviewed by me and considered in my medical decision making (see chart for details).  84 year old female with PMH as noted above presents with generalized weakness, acute onset this morning, and with some dizziness but no other focal symptoms.  On exam her vital signs are normal.  Her neuro exam is nonfocal.  The abdomen is soft nontender.  The remainder of the exam is as described above.  Differential is broad but includes infection, dehydration, electrolyte abnormality, or other metabolic cause, or less likely cardiac etiology.  There are no focal neuro findings to suggest a primary CNS cause.  We will obtain labs, chest x-ray and UA, and reassess.  ----------------------------------------- 12:47 PM on 07/01/2018 -----------------------------------------  The lab work-up is unremarkable, except for UA which is consistent with UTI.  This is the most likely explanation of the patient's symptoms.  Her chest x-ray is  negative.  On reassessment, the patient's vital signs have remained stable and her blood pressure has improved on its own.  The RN got the patient up to go to the bathroom and ambulate, and she was able to ambulate without assistance.  The patient's son and daughter-in-law initially expressed some trepidation about discharge home because of the weakness, however after the trial  of ambulation they stated that they were comfortable with her functional status and with taking her home.  She has significantly improved from when she first called EMS this morning.  At this time, there is no evidence of sepsis or systemic infection.  The patient is tolerating p.o. and can take the antibiotic.  There is no indication for inpatient management.  I counseled the patient and her family members extensively on return precautions, and they expressed understanding.  The patient is stable for discharge home at this time.  ____________________________________________   FINAL CLINICAL IMPRESSION(S) / ED DIAGNOSES  Final diagnoses:  Urinary tract infection without hematuria, site unspecified      NEW MEDICATIONS STARTED DURING THIS VISIT:  New Prescriptions   CEPHALEXIN (KEFLEX) 500 MG CAPSULE    Take 1 capsule (500 mg total) by mouth 2 (two) times daily for 10 days.     Note:  This document was prepared using Dragon voice recognition software and may include unintentional dictation errors.    Arta Silence, MD 07/01/18 1250

## 2018-07-01 NOTE — Discharge Instructions (Signed)
Take the antibiotic as prescribed and finish the full course.    Return to the ER immediately for new or worsening weakness, difficulty or inability to walk, fevers, vomiting, inability to take the medications, back or flank pain, or any other new or worsening symptoms that concern you.  Follow-up with primary care doctor within 1 week.

## 2018-07-01 NOTE — ED Triage Notes (Signed)
Patient presents to the ED via EMS from home.  Patient is complaining of weakness that began after she woke up around 7am.  Patient states weakness has been progressive and states she also feels somewhat dizzy, especially when she changes positions.  Denies vision changes.  Reports generalized weakness, denies any to one side.  Denies headache.  Patient states she has Afib and this weakness has been a result in the past.

## 2018-07-02 ENCOUNTER — Telehealth: Payer: Self-pay

## 2018-07-02 NOTE — Telephone Encounter (Signed)
Spoke to pt. She asked that I call [name]. I left a message for [name] to let her know if pt is not feeling better to make an OV

## 2018-07-02 NOTE — Telephone Encounter (Signed)
Have her plan a follow up her in the next week or 2 if she is not feeling better

## 2018-07-02 NOTE — Telephone Encounter (Signed)
Spoke to pt to see how she was doing after ER visit yesterday for weakness. She said she is not hurting like she was, but feels weak.

## 2018-07-25 ENCOUNTER — Inpatient Hospital Stay
Admission: EM | Admit: 2018-07-25 | Discharge: 2018-07-27 | DRG: 311 | Disposition: A | Discharge status: Home / Self Care | Payer: PPO | Attending: Internal Medicine | Admitting: Internal Medicine

## 2018-07-25 ENCOUNTER — Other Ambulatory Visit: Payer: Self-pay

## 2018-07-25 ENCOUNTER — Telehealth: Payer: Self-pay | Admitting: Cardiovascular Disease

## 2018-07-25 ENCOUNTER — Encounter: Payer: Self-pay | Admitting: Emergency Medicine

## 2018-07-25 ENCOUNTER — Emergency Department: Payer: PPO

## 2018-07-25 DIAGNOSIS — Z79899 Other long term (current) drug therapy: Secondary | ICD-10-CM

## 2018-07-25 DIAGNOSIS — Z8261 Family history of arthritis: Secondary | ICD-10-CM | POA: Diagnosis not present

## 2018-07-25 DIAGNOSIS — F0390 Unspecified dementia without behavioral disturbance: Secondary | ICD-10-CM | POA: Diagnosis not present

## 2018-07-25 DIAGNOSIS — R011 Cardiac murmur, unspecified: Secondary | ICD-10-CM | POA: Diagnosis present

## 2018-07-25 DIAGNOSIS — Z853 Personal history of malignant neoplasm of breast: Secondary | ICD-10-CM | POA: Diagnosis not present

## 2018-07-25 DIAGNOSIS — R002 Palpitations: Secondary | ICD-10-CM | POA: Diagnosis not present

## 2018-07-25 DIAGNOSIS — Z66 Do not resuscitate: Secondary | ICD-10-CM | POA: Diagnosis not present

## 2018-07-25 DIAGNOSIS — I252 Old myocardial infarction: Secondary | ICD-10-CM | POA: Diagnosis not present

## 2018-07-25 DIAGNOSIS — N183 Chronic kidney disease, stage 3 (moderate): Secondary | ICD-10-CM | POA: Diagnosis present

## 2018-07-25 DIAGNOSIS — H9190 Unspecified hearing loss, unspecified ear: Secondary | ICD-10-CM | POA: Diagnosis present

## 2018-07-25 DIAGNOSIS — R7989 Other specified abnormal findings of blood chemistry: Secondary | ICD-10-CM | POA: Diagnosis not present

## 2018-07-25 DIAGNOSIS — Z90710 Acquired absence of both cervix and uterus: Secondary | ICD-10-CM | POA: Diagnosis not present

## 2018-07-25 DIAGNOSIS — K219 Gastro-esophageal reflux disease without esophagitis: Secondary | ICD-10-CM | POA: Diagnosis not present

## 2018-07-25 DIAGNOSIS — Z888 Allergy status to other drugs, medicaments and biological substances status: Secondary | ICD-10-CM | POA: Diagnosis not present

## 2018-07-25 DIAGNOSIS — M1990 Unspecified osteoarthritis, unspecified site: Secondary | ICD-10-CM | POA: Diagnosis not present

## 2018-07-25 DIAGNOSIS — I495 Sick sinus syndrome: Secondary | ICD-10-CM | POA: Diagnosis not present

## 2018-07-25 DIAGNOSIS — Z833 Family history of diabetes mellitus: Secondary | ICD-10-CM

## 2018-07-25 DIAGNOSIS — Z7901 Long term (current) use of anticoagulants: Secondary | ICD-10-CM | POA: Diagnosis not present

## 2018-07-25 DIAGNOSIS — I2510 Atherosclerotic heart disease of native coronary artery without angina pectoris: Secondary | ICD-10-CM | POA: Diagnosis present

## 2018-07-25 DIAGNOSIS — R531 Weakness: Secondary | ICD-10-CM | POA: Diagnosis present

## 2018-07-25 DIAGNOSIS — I248 Other forms of acute ischemic heart disease: Principal | ICD-10-CM | POA: Diagnosis present

## 2018-07-25 DIAGNOSIS — Z86010 Personal history of colonic polyps: Secondary | ICD-10-CM | POA: Diagnosis not present

## 2018-07-25 DIAGNOSIS — I493 Ventricular premature depolarization: Secondary | ICD-10-CM | POA: Diagnosis present

## 2018-07-25 DIAGNOSIS — K921 Melena: Secondary | ICD-10-CM | POA: Diagnosis not present

## 2018-07-25 DIAGNOSIS — Z800 Family history of malignant neoplasm of digestive organs: Secondary | ICD-10-CM | POA: Diagnosis not present

## 2018-07-25 DIAGNOSIS — E538 Deficiency of other specified B group vitamins: Secondary | ICD-10-CM | POA: Diagnosis present

## 2018-07-25 DIAGNOSIS — I517 Cardiomegaly: Secondary | ICD-10-CM | POA: Diagnosis present

## 2018-07-25 DIAGNOSIS — J45909 Unspecified asthma, uncomplicated: Secondary | ICD-10-CM | POA: Diagnosis present

## 2018-07-25 DIAGNOSIS — E785 Hyperlipidemia, unspecified: Secondary | ICD-10-CM | POA: Diagnosis present

## 2018-07-25 DIAGNOSIS — I480 Paroxysmal atrial fibrillation: Secondary | ICD-10-CM

## 2018-07-25 DIAGNOSIS — F419 Anxiety disorder, unspecified: Secondary | ICD-10-CM | POA: Diagnosis present

## 2018-07-25 DIAGNOSIS — Z8249 Family history of ischemic heart disease and other diseases of the circulatory system: Secondary | ICD-10-CM | POA: Diagnosis not present

## 2018-07-25 DIAGNOSIS — R778 Other specified abnormalities of plasma proteins: Secondary | ICD-10-CM | POA: Diagnosis present

## 2018-07-25 DIAGNOSIS — D631 Anemia in chronic kidney disease: Secondary | ICD-10-CM | POA: Diagnosis present

## 2018-07-25 DIAGNOSIS — Z7951 Long term (current) use of inhaled steroids: Secondary | ICD-10-CM

## 2018-07-25 DIAGNOSIS — Z9013 Acquired absence of bilateral breasts and nipples: Secondary | ICD-10-CM | POA: Diagnosis not present

## 2018-07-25 DIAGNOSIS — I4891 Unspecified atrial fibrillation: Secondary | ICD-10-CM | POA: Diagnosis not present

## 2018-07-25 LAB — APTT
aPTT: 34 seconds (ref 24–36)
aPTT: 46 seconds — ABNORMAL HIGH (ref 24–36)

## 2018-07-25 LAB — LIPID PANEL
Cholesterol: 154 mg/dL (ref 0–200)
HDL: 54 mg/dL (ref >40)
LDL Cholesterol: 92 mg/dL (ref 0–99)
Total CHOL/HDL Ratio: 2.9 RATIO
Triglycerides: 41 mg/dL (ref <150)
VLDL: 8 mg/dL (ref 0–40)

## 2018-07-25 LAB — CBC
HCT: 39.8 % (ref 36.0–46.0)
Hemoglobin: 13.1 g/dL (ref 12.0–15.0)
MCH: 31.4 pg (ref 26.0–34.0)
MCHC: 32.9 g/dL (ref 30.0–36.0)
MCV: 95.4 fL (ref 80.0–100.0)
Platelets: 200 10*3/uL (ref 150–400)
RBC: 4.17 MIL/uL (ref 3.87–5.11)
RDW: 13.2 % (ref 11.5–15.5)
WBC: 5.9 10*3/uL (ref 4.0–10.5)
nRBC: 0 % (ref 0.0–0.2)

## 2018-07-25 LAB — BASIC METABOLIC PANEL
Anion gap: 6 (ref 5–15)
BUN: 24 mg/dL — ABNORMAL HIGH (ref 8–23)
CO2: 28 mmol/L (ref 22–32)
Calcium: 9.3 mg/dL (ref 8.9–10.3)
Chloride: 108 mmol/L (ref 98–111)
Creatinine, Ser: 0.97 mg/dL (ref 0.44–1.00)
GFR calc Af Amer: >60 mL/min (ref >60)
GFR calc non Af Amer: 52 mL/min — ABNORMAL LOW (ref >60)
Glucose, Bld: 117 mg/dL — ABNORMAL HIGH (ref 70–99)
Potassium: 4.2 mmol/L (ref 3.5–5.1)
Sodium: 142 mmol/L (ref 135–145)

## 2018-07-25 LAB — HEPARIN LEVEL (UNFRACTIONATED): Heparin Unfractionated: >3.6 IU/mL — ABNORMAL HIGH (ref 0.30–0.70)

## 2018-07-25 LAB — TROPONIN I
Troponin I: 0.25 ng/mL (ref <0.03)
Troponin I: 0.49 ng/mL (ref <0.03)
Troponin I: 0.56 ng/mL (ref <0.03)

## 2018-07-25 LAB — TSH: TSH: 0.851 u[IU]/mL (ref 0.350–4.500)

## 2018-07-25 LAB — PROTIME-INR
INR: 1.15
Prothrombin Time: 14.6 seconds (ref 11.4–15.2)

## 2018-07-25 LAB — MAGNESIUM: Magnesium: 1.9 mg/dL (ref 1.7–2.4)

## 2018-07-25 MED ORDER — RIVAROXABAN 20 MG PO TABS
20.0000 mg | ORAL_TABLET | Freq: Every day | ORAL | Status: DC
  Administered 2018-07-25: 20 mg via ORAL
  Filled 2018-07-25: qty 1

## 2018-07-25 MED ORDER — ONDANSETRON HCL 4 MG/2ML IJ SOLN: 4.0000 mg | Freq: Four times a day (QID) | INTRAMUSCULAR | Status: DC | PRN

## 2018-07-25 MED ORDER — ALBUTEROL SULFATE (2.5 MG/3ML) 0.083% IN NEBU: 2.5000 mg | INHALATION_SOLUTION | Freq: Four times a day (QID) | RESPIRATORY_TRACT | Status: DC | PRN

## 2018-07-25 MED ORDER — ALBUTEROL SULFATE (2.5 MG/3ML) 0.083% IN NEBU: 2.5000 mg | INHALATION_SOLUTION | RESPIRATORY_TRACT | Status: DC | PRN

## 2018-07-25 MED ORDER — ADULT MULTIVITAMIN W/MINERALS CH
1.0000 | ORAL_TABLET | Freq: Every day | ORAL | Status: DC
  Administered 2018-07-25: 1 via ORAL
  Filled 2018-07-25 (×2): qty 1

## 2018-07-25 MED ORDER — ACETAMINOPHEN 325 MG PO TABS: 650.0000 mg | ORAL_TABLET | Freq: Four times a day (QID) | ORAL | Status: DC | PRN

## 2018-07-25 MED ORDER — VITAMIN B-12 1000 MCG PO TABS
500.0000 ug | ORAL_TABLET | ORAL | Status: DC
  Administered 2018-07-25 – 2018-07-27 (×2): 500 ug via ORAL
  Filled 2018-07-25 (×3): qty 1

## 2018-07-25 MED ORDER — HEPARIN BOLUS VIA INFUSION
3000.0000 [IU] | Freq: Once | INTRAVENOUS | Status: DC
  Filled 2018-07-25: qty 3000

## 2018-07-25 MED ORDER — ACETAMINOPHEN 650 MG RE SUPP: 650.0000 mg | Freq: Four times a day (QID) | RECTAL | Status: DC | PRN

## 2018-07-25 MED ORDER — HEPARIN (PORCINE) 25000 UT/250ML-% IV SOLN: 950.0000 [IU]/h | INTRAVENOUS | Status: DC

## 2018-07-25 MED ORDER — VITAMIN D3 25 MCG (1000 UNIT) PO TABS
5000.0000 [IU] | ORAL_TABLET | Freq: Every day | ORAL | Status: DC
  Administered 2018-07-25 – 2018-07-27 (×4): 5000 [IU] via ORAL
  Filled 2018-07-25 (×3): qty 5

## 2018-07-25 MED ORDER — LOSARTAN POTASSIUM 50 MG PO TABS
50.0000 mg | ORAL_TABLET | Freq: Every day | ORAL | Status: DC
  Administered 2018-07-25 – 2018-07-27 (×2): 50 mg via ORAL
  Filled 2018-07-25 (×3): qty 1

## 2018-07-25 MED ORDER — DONEPEZIL HCL 5 MG PO TABS
5.0000 mg | ORAL_TABLET | Freq: Every day | ORAL | Status: DC
  Administered 2018-07-25 – 2018-07-26 (×2): 5 mg via ORAL
  Filled 2018-07-25 (×3): qty 1

## 2018-07-25 MED ORDER — MOMETASONE FURO-FORMOTEROL FUM 100-5 MCG/ACT IN AERO
2.0000 | INHALATION_SPRAY | Freq: Two times a day (BID) | RESPIRATORY_TRACT | Status: DC
  Administered 2018-07-25 – 2018-07-27 (×4): 2 via RESPIRATORY_TRACT
  Filled 2018-07-25: qty 8.8

## 2018-07-25 MED ORDER — ONDANSETRON HCL 4 MG PO TABS: 4.0000 mg | ORAL_TABLET | Freq: Four times a day (QID) | ORAL | Status: DC | PRN

## 2018-07-25 MED ORDER — NITROGLYCERIN 0.4 MG SL SUBL: 0.4000 mg | SUBLINGUAL_TABLET | SUBLINGUAL | Status: DC | PRN

## 2018-07-25 MED ORDER — POLYETHYLENE GLYCOL 3350 17 G PO PACK: 17.0000 g | PACK | Freq: Every day | ORAL | Status: DC | PRN

## 2018-07-25 MED ORDER — MONTELUKAST SODIUM 10 MG PO TABS
10.0000 mg | ORAL_TABLET | Freq: Every evening | ORAL | Status: DC
  Administered 2018-07-25 – 2018-07-26 (×2): 10 mg via ORAL
  Filled 2018-07-25 (×2): qty 1

## 2018-07-25 NOTE — ED Notes (Signed)
AAOx3.  Skin warm and dry.  NAD 

## 2018-07-25 NOTE — Progress Notes (Addendum)
ANTICOAGULATION CONSULT NOTE - Initial Consult  Pharmacy Consult for Heparin drip Indication: atrial fibrillation  Allergies  Allergen Reactions  . Amiodarone Other (See Comments)    Chest discomfort  . Other Rash    oramycin    Patient Measurements: Height: 5\' 4"  (162.6 cm) Weight: 138 lb (62.6 kg) IBW/kg (Calculated) : 54.7 Heparin Dosing Weight: 62.6 kg  Vital Signs: Temp: 97.7 F (36.5 C) (12/12 1536) Temp Source: Oral (12/12 1536) BP: 99/63 (12/12 1625) Pulse Rate: 62 (12/12 1625)  Labs: Recent Labs    07/25/18 1049 07/25/18 1200 07/25/18 1510  HGB 13.1  --   --   HCT 39.8  --   --   PLT 200  --   --   APTT  --  34  --   LABPROT  --  14.6  --   INR  --  1.15  --   CREATININE 0.97  --   --   TROPONINI 0.25*  --  0.49*    Estimated Creatinine Clearance: 34.6 mL/min (by C-G formula based on SCr of 0.97 mg/dL).   Medical History: Past Medical History:  Diagnosis Date  . Allergic rhinitis   . Asthma   . Biceps tendon tear    a. right->s/p surgical repair winter of 2014.  . Diverticulosis of colon   . GERD (gastroesophageal reflux disease)   . Hammer toe of right foot   . Hx of colonic polyps   . HX: breast cancer   . Meniere's disease   . NSTEMI (non-ST elevated myocardial infarction) ([place])    a. 11/2012 in setting of rapid afib;  b. 11/2012 Cath: nl EF, nonobs CAD->Med Rx;  b. 11/2012 Echo: EF 60-65%, mild LVH, mild MR/TR.  . Osteoarthritis   . PAF (paroxysmal atrial fibrillation) ([place])    a. Eliquis and amio initiated 11/2012 in setting of admission with RVR and NSTEMI;  b. Subsequently changed to flecainide and xarelto;  c. 11/2013 Echo: EF 60-65%, mild LVH, mod MR/TR.  Marland Kitchen Sinus brady-tachy syndrome ([place])    a. Post-conversion pause of 7.2 seconds during 11/2012 hospitalization @ [facility]; b. 04/2017 Event Monitor: No significant arrhythmias.    Medications:  Scheduled:  . cholecalciferol  5,000 Units Oral Daily  . donepezil  5 mg Oral QHS  . losartan  50  mg Oral Daily  . mometasone-formoterol  2 puff Inhalation BID  . montelukast  10 mg Oral QPM  . multivitamin with minerals  1 tablet Oral Daily  . vitamin B-12  500 mcg Oral QODAY   Infusions:  . heparin      Assessment: 84 yo F on  Xarelto PTA for Afib. Last dose of Xarelto 12/11 @ 2000. TRansition to Heparin drip for possible PPM. Hgb 13.1  Plt 200  APTT    HL     Goal of Therapy:  Heparin level 0.3-0.7 units/ml  APTT  66-102 Monitor platelets by anticoagulation protocol: Yes   Plan:  For Crcl > 30 ml/min will start Heparin drip 24 hrs after last Xarelto dose (start 12/12 @ 2000). Will give 3000 unit bolus x 1 and start drip at 950 units/hr Will check aPTT/HL to determine what to follow for adjustments.  Merrill,Kristin A 07/25/2018,5:24 PM

## 2018-07-25 NOTE — Progress Notes (Signed)
   Patient continues to have sinus pauses on telemetry. Troponin continues to trend up.   Awaiting repeat EKG and orthostatics to assess rhythm and chronotropic competence.  In the event that patient will need PPM or invasive procedure, Xarelto has been discontinued and heparin drip started.   Signed, Arvil Chaco, PA-C 07/25/2018, 5:10 PM Pager [phone]

## 2018-07-25 NOTE — ED Notes (Signed)
ED TO INPATIENT HANDOFF REPORT  Name/Age/Gender Leah Hayden 84 y.o. female  Code Status Code Status History    Date Active Date Inactive Code Status Order ID Comments User Context   04/12/2018 4742 04/15/2018 1725 DNR [id number]  Gorden Harms, MD Inpatient    Questions for Most Recent Historical Code Status (Order [id number])    Question Answer Comment   In the event of cardiac or respiratory ARREST Do not call a "code blue"    In the event of cardiac or respiratory ARREST Do not perform Intubation, CPR, defibrillation or ACLS    In the event of cardiac or respiratory ARREST Use medication by any route, position, wound care, and other measures to relive pain and suffering. May use oxygen, suction and manual treatment of airway obstruction as needed for comfort.    Comments Nurse may pronounce       Home/SNF/Other Home  Chief Complaint a fib  Level of Care/Admitting Diagnosis ED Disposition    ED Disposition Condition [facility] Area: [place] [100120]  Level of Care: Telemetry [5]  Diagnosis: Elevated troponin [416606]  Admitting Physician: Odessa Fleming  Attending Physician: Odessa Fleming  Estimated length of stay: past midnight tomorrow  Certification:: I certify this patient will need inpatient services for at least 2 midnights  PT Class (Do Not Modify): Inpatient [101]  PT Acc Code (Do Not Modify): Private [1]       Medical History Past Medical History:  Diagnosis Date  . Allergic rhinitis   . Asthma   . Biceps tendon tear    a. right->s/p surgical repair winter of 2014.  . Diverticulosis of colon   . GERD (gastroesophageal reflux disease)   . Hammer toe of right foot   . Hx of colonic polyps   . HX: breast cancer   . Meniere's disease   . NSTEMI (non-ST elevated myocardial infarction) ([place])    a. 11/2012 in setting of rapid afib;  b. 11/2012 Cath: nl EF, nonobs CAD->Med Rx;  b. 11/2012 Echo: EF 60-65%, mild  LVH, mild MR/TR.  . Osteoarthritis   . PAF (paroxysmal atrial fibrillation) ([place])    a. Eliquis and amio initiated 11/2012 in setting of admission with RVR and NSTEMI;  b. Subsequently changed to flecainide and xarelto;  c. 11/2013 Echo: EF 60-65%, mild LVH, mod MR/TR.  Marland Kitchen Sinus brady-tachy syndrome ([place])    a. Post-conversion pause of 7.2 seconds during 11/2012 hospitalization @ [facility]; b. 04/2017 Event Monitor: No significant arrhythmias.    Allergies Allergies  Allergen Reactions  . Amiodarone Other (See Comments)    Chest discomfort  . Other Rash    oramycin    IV Location/Drains/Wounds Patient Lines/Drains/Airways Status   Active Line/Drains/Airways    Name:   Placement date:   Placement time:   Site:   Days:   Peripheral IV 07/25/18 Right Forearm   07/25/18    1159    Forearm   less than 1   Incision 01/02/13 Hand Right   01/02/13    0914     2030          Labs/Imaging Results for orders placed or performed during the hospital encounter of 07/25/18 (from the past 48 hour(s))  Basic metabolic panel     Status: Abnormal   Collection Time: 07/25/18 10:49 AM  Result Value Ref Range   Sodium 142 135 - 145 mmol/L   Potassium 4.2 3.5 - 5.1 mmol/L  Chloride 108 98 - 111 mmol/L   CO2 28 22 - 32 mmol/L   Glucose, Bld 117 (H) 70 - 99 mg/dL   BUN 24 (H) 8 - 23 mg/dL   Creatinine, Ser 0.97 0.44 - 1.00 mg/dL   Calcium 9.3 8.9 - 10.3 mg/dL   GFR calc non Af Amer 52 (L) >60 mL/min   GFR calc Af Amer >60 >60 mL/min   Anion gap 6 5 - 15    Comment: Performed at [facility], [address] [place], [place] [address]  CBC     Status: None   Collection Time: 07/25/18 10:49 AM  Result Value Ref Range   WBC 5.9 4.0 - 10.5 K/uL   RBC 4.17 3.87 - 5.11 MIL/uL   Hemoglobin 13.1 12.0 - 15.0 g/dL   HCT 39.8 36.0 - 46.0 %   MCV 95.4 80.0 - 100.0 fL   MCH 31.4 26.0 - 34.0 pg   MCHC 32.9 30.0 - 36.0 g/dL   RDW 13.2 11.5 - 15.5 %   Platelets 200 150 - 400 K/uL   nRBC 0.0 0.0 - 0.2  %    Comment: Performed at [facility], [address] [place], [place] [address]  Troponin I - ONCE - STAT     Status: Abnormal   Collection Time: 07/25/18 10:49 AM  Result Value Ref Range   Troponin I 0.25 (HH) <0.03 ng/mL    Comment: CRITICAL RESULT CALLED TO, READ BACK BY AND VERIFIED WITH ASHLEY ROSS@1127  ON 07/25/18 BY HKP Performed at [facility], [address] [place], [place] [address]   Protime-INR     Status: None   Collection Time: 07/25/18 12:00 PM  Result Value Ref Range   Prothrombin Time 14.6 11.4 - 15.2 seconds   INR 1.15     Comment: Performed at [facility], [address] [place], [place] [address]  APTT     Status: None   Collection Time: 07/25/18 12:00 PM  Result Value Ref Range   aPTT 34 24 - 36 seconds    Comment: Performed at [facility], [address], [place],  [address]   Dg Chest 2 View  Result Date: 07/25/2018 CLINICAL DATA:  Atrial fibrillation EXAM: CHEST - 2 VIEW COMPARISON:  None. FINDINGS: The heart is moderately enlarged. Normal vascularity. Clear lungs. Hyperaeration. IMPRESSION: Cardiomegaly without decompensation. Electronically Signed   By: Marybelle Killings M.D.   On: 07/25/2018 11:11    Pending Labs Unresulted Labs (From admission, onward)    Start     Ordered   Signed and Held  Troponin I - Now Then Q6H  Now then every 6 hours,   R     Signed and Held          Vitals/Pain Today's Vitals   07/25/18 1042 07/25/18 1047 07/25/18 1203 07/25/18 1330  BP:  116/69  (!) 147/64  Pulse:  69  (!) 58  Resp:  18  (!) 22  Temp:  98 F (36.7 C)    TempSrc:  Oral    SpO2:  98%  99%  Weight: 62.6 kg     Height: 5\' 4"  (1.626 m)     PainSc: 0-No pain  0-No pain     Isolation Precautions No active isolations  Medications Medications - No data to display  Mobility walks with device

## 2018-07-25 NOTE — ED Triage Notes (Signed)
Pt reports hx of a-fib and is usually controlled but this am she woke up at 4am and her heart rate was irregular. Pt denies pain or SOB but reports she can feel her heart beating funny.

## 2018-07-25 NOTE — Plan of Care (Addendum)
Patient second troponin resulted at 0.49. HR also noted to drop to 37 with 2.5 second pause but did not sustain, Cardiology notified. Will continue to assess and monitor.   Problem: Cardiac: Goal: Ability to achieve and maintain adequate cardiopulmonary perfusion will improve Outcome: Not Progressing

## 2018-07-25 NOTE — Progress Notes (Signed)
Family Meeting Note  Advance Directive: yes  Today a meeting took place with the patient and daughter in law  She came in with palpitation and weakness. No chest pain shortness of breath. Has history of chronic atrial fibrillation and history of coronary artery disease. Per echo EF is 60 to 65% this was done in January 2019. She is asymptomatic at present. Discuss code status patient has a living will which is DNR.   Time spent during discussion 16 minutes  Fritzi Mandes, MD

## 2018-07-25 NOTE — Consult Note (Signed)
Cardiology Consultation:   Patient ID: Leah Hayden MRN: [id number]; DOB: 18-Jul-1928  Admit date: 07/25/2018 Date of Consult: 07/25/2018  Primary Care Provider: Venia Carbon, MD Primary Cardiologist: Ida Rogue, MD  Primary Electrophysiologist:  None    Patient Profile:   Leah Hayden is a 84 y.o. female with a hx of paroxysmal atrial fibrillation on Xarelto and PRN diltiazem, tachybradycardia syndrome, NSTEMI in April 2014 with nonobstructive CAD on catheterization, PSVT, GERD, asthma, dementia/memory loss, and breast cancer who is being seen today for the evaluation of elevated troponin at the request of Dr. Posey Pronto.  History of Present Illness:   Leah Hayden is an 84 yo female with PMH. She apparently lives by herself locally but her family lives next door and often assist her with her care.  Her daughter reportedly generally arranges all of her medications for the week. Per daughter, patient has not missed any doses of anticoagulation.  Atrial fibrillation was initially managed with amiodarone and Eliquis in April 2014.  Was during that admission that she ruled in for NSTEMI and underwent diagnostic catheterization which showed nonobstructive CAD.  Echo showed EF of 60 to 65%.  She was also noted during that admission to have a 7.2-second postconversion asymptomatic pause.  She was later switched to flecainide and Xarelto, which she has been on since that time.  Echocardiogram in April 2015 continues to show normal LVEF with an EF 60 to 65%, mild LVH, and moderate MR and TR.  September 2018, she underwent event monitoring which did not show significant arrhythmias.  In in the cardiology clinic at 02/22/2018, at which time she was doing well and no changes were made to her medical regimen.   03/2018, she was admitted to the [facility] with weakness and cardiology consulted as she had developed rapid atrial fibrillation and found to be hypertensive and bradycardic at home with heart rates in  the 40s.  In the emergency department, she developed rapid A. fib and subsequently took a flecainide 50 mg tablet with subsequent conversion to SR following 5.9-second pause.  It was thought that this was likely 2/2 increased atrial and AVN refractory periods following flecainide.  She remained bradycardic with rates in the 40s and 50s but was hemodynamically stable. In September 2019, she was seen by Dr. Rockey Situ with flecainide discontinued for PRN diltiazem when in Afib.  07/25/2018: She presented to [facility] ED with complaint of weakness/fatigue, palpitations, and nausea that started around 4AM on 12/12. No associated CP, diaphoresis, or SOB. The patient's daughter reportedly came over and measured the patient's heart rate and stated, "it was jumping around, from 99 to 26s." She then called the patient's grandson, who works at [id number] to verify that the patient was in atrial fibrillation. The grandson brought his own medical equipment to verify the rhythm and reportedly agreed that the patient was in A. fib.  The daughter stated that both she and the grandson were not certain if they should give the patient diltiazem, because on review of her heart rate, there are times at which he heart rate / ventricular would drop down to 50 bpm.  She stated that Dr. Rockey Situ had said that they should not give the patient diltiazem if the HR was low. The daughter also noted that the SBP was dropping each time they measured it. Given that they were not sure if the patient should received diltiazem, they decided to come to the ED.  Of note, both patient  and daughter were not able to estimate a length of time that the symptoms persisted for the patient; however, by the time they reached the ED, the patient reported that her symptoms had resolved and EKG showed SR. No reported stressors by patient and daughter.   In the ED 07/25/2018 Vitals: BP 116/69, HR 69, RR 18, T 90 76F, SPO2 98% Labs: Sodium  142, potassium 4.2, glucose 117, creatinine 0.97, calcium 9.3, troponin 0 0.25, WBC 5.9, RBC 4.17, hemoglobin 13.1, hematocrit 39.8, platelets 200, EKG: NSR, possible LAE.  Ischemic changes noted in the anterolateral leads CXR: Cardiomegaly, decompensation  Cardiology was consulted for elevated troponin. The patient continues to deny chest pain.  Past Medical History:  Diagnosis Date  . Allergic rhinitis   . Asthma   . Biceps tendon tear    a. right->s/p surgical repair winter of 2014.  . Diverticulosis of colon   . GERD (gastroesophageal reflux disease)   . Hammer toe of right foot   . Hx of colonic polyps   . HX: breast cancer   . Meniere's disease   . NSTEMI (non-ST elevated myocardial infarction) ([place])    a. 11/2012 in setting of rapid afib;  b. 11/2012 Cath: nl EF, nonobs CAD->Med Rx;  b. 11/2012 Echo: EF 60-65%, mild LVH, mild MR/TR.  . Osteoarthritis   . PAF (paroxysmal atrial fibrillation) ([place])    a. Eliquis and amio initiated 11/2012 in setting of admission with RVR and NSTEMI;  b. Subsequently changed to flecainide and xarelto;  c. 11/2013 Echo: EF 60-65%, mild LVH, mod MR/TR.  Marland Kitchen Sinus brady-tachy syndrome ([place])    a. Post-conversion pause of 7.2 seconds during 11/2012 hospitalization @ [facility]; b. 04/2017 Event Monitor: No significant arrhythmias.    Past Surgical History:  Procedure Laterality Date  . ABDOMINAL HYSTERECTOMY    . CARDIAC CATHETERIZATION  11/2013   [facility]  . [place]   2nd to left toe  . ESOPHAGOGASTRODUODENOSCOPY  10/1999   inflammation only   . FOREIGN BODY REMOVAL Right 01/02/2013   Procedure: FOREIGN BODY REMOVAL RIGHT INDEX FINGER;  Surgeon: Cammie Sickle., MD;  Location: [facility];  Service: Orthopedics;  Laterality: Right;  . HAMMER TOE SURGERY  11/15   Dr Milinda Pointer  . MASTECTOMY  1984   left   . MASTECTOMY  1985   right  . PARTIAL HYSTERECTOMY  1966   w/ bladder tack  . ROTATOR CUFF REPAIR  10/09   right   . TOE  SURGERY  2001/2002   2nd/3rd,left      Home Medications:  Prior to Admission medications   Medication Sig Start Date End Date Taking? Authorizing Provider  albuterol (PROVENTIL HFA) 108 (90 BASE) MCG/ACT inhaler Inhale 2 puffs into the lungs every 6 (six) hours as needed.     Yes [provider]  cholecalciferol (VITAMIN D) 1000 units tablet Take 5,000 Units by mouth daily.   Yes [provider]  cyanocobalamin 500 MCG tablet Take 500 mcg by mouth every other day.    Yes [provider]  diltiazem (CARDIZEM) 30 MG tablet Take 1 tablet (30 mg) by mouth every 6-8 hours as needed for breakthrough a-fib 04/19/18  Yes Gollan, Kathlene November, MD  donepezil (ARICEPT) 5 MG tablet TAKE 1 TABLET (5 MG TOTAL) BY MOUTH AT BEDTIME. 02/08/18  Yes Venia Carbon, MD  losartan (COZAAR) 50 MG tablet Take 1 tablet (50 mg total) by mouth daily. 05/14/18 07/25/18 Yes  Minna Merritts, MD  montelukast (SINGULAIR) 10 MG tablet Take 10 mg by mouth every evening.    Yes [provider]  Multiple Vitamins-Minerals (CENTRUM SILVER 50+WOMEN) TABS Take 1 tablet by mouth daily.   Yes [provider]  nitroGLYCERIN (NITROSTAT) 0.4 MG SL tablet Place 1 tablet (0.4 mg total) under the tongue every 5 (five) minutes as needed for chest pain. 02/22/18  Yes Gollan, Kathlene November, MD  XARELTO 20 MG TABS tablet TAKE 1 TABLET BY MOUTH EVERY DAY WITH SUPPER Patient taking differently: Take 20 mg by mouth daily with supper.  06/05/18  Yes Gollan, Kathlene November, MD  Fluticasone-Salmeterol (ADVAIR DISKUS) 100-50 MCG/DOSE AEPB Inhale 1 puff into the lungs every 12 (twelve) hours.      [provider]    Inpatient Medications: Scheduled Meds:  Continuous Infusions:  PRN Meds:   Allergies:    Allergies  Allergen Reactions  . Amiodarone Other (See Comments)    Chest discomfort  . Other Rash    oramycin    Social History:   Social History   Socioeconomic History  . Marital status:  Widowed    Spouse name: Not on file  . Number of children: 2  . Years of education: Not on file  . Highest education level: Not on file  Occupational History  . 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 Financial resource strain: Not on file  . Food insecurity:    Worry: Not on file    Inability: Not on file  . Transportation needs:    Medical: Not on file    Non-medical: Not on file  Tobacco Use  . Smoking status: Never Smoker  . Smokeless tobacco: Never Used  Substance and Sexual Activity  . Alcohol use: No    Alcohol/week: 0.0 standard drinks  . Drug use: No  . Sexual activity: Not on file  Lifestyle  . Physical activity:    Days per week: Not on file    Minutes per session: Not on file  . Stress: Not on file  Relationships  . Social connections:    Talks on phone: Not on file    Gets together: Not on file    Attends religious service: Not on file    Active member of club or organization: Not on file    Attends meetings of clubs or organizations: Not on file    Relationship status: Not on file  . Intimate partner violence:    Fear of current or ex partner: Not on file    Emotionally abused: Not on file    Physically abused: Not on file    Forced sexual activity: Not on file  Other Topics Concern  . Not on file  Social History Narrative   Now has living will   Sons are health care POAs   Has DNR   No tube feeds if cognitively unaware    Family History:    Family History  Problem Relation Age of Onset  . Heart failure Mother   . Gout Mother   . Heart disease Mother        heart failure  . Arthritis Mother   . Heart failure Father   . Heart disease Father        heart failure  . Diabetes Maternal Grandmother   . Colon cancer Maternal Grandfather   . Cancer Maternal Grandfather        colon  . Arthritis  Brother      ROS:  Please see the history of present illness.  Review of Systems  Constitutional: Positive  for malaise/fatigue. Negative for diaphoresis.  HENT: Positive for hearing loss.        Chronic issue  Respiratory: Negative for shortness of breath.   Cardiovascular: Positive for palpitations and leg swelling. Negative for chest pain and orthopnea.       Palpitations now resolved.  Also noted leg pain 3-4 weeks earlier, now resolved  Gastrointestinal: Positive for nausea. Negative for vomiting.       Nausea now resolved  Neurological: Positive for weakness. Negative for headaches.  Psychiatric/Behavioral: Positive for memory loss.       Chronic, dementia    All other ROS reviewed and negative.     Physical Exam/Data:   Vitals:   07/25/18 1042 07/25/18 1047 07/25/18 1330 07/25/18 1400  BP:  116/69 (!) 147/64 (!) 119/53  Pulse:  69 (!) 58 (!) 52  Resp:  18 (!) 22 18  Temp:  98 F (36.7 C)    TempSrc:  Oral    SpO2:  98% 99% 100%  Weight: 62.6 kg     Height: 5\' 4"  (1.626 m)      No intake or output data in the 24 hours ending 07/25/18 1425 Filed Weights   07/25/18 1042  Weight: 62.6 kg   Body mass index is 23.69 kg/m.  General:  Elderly female lying in bed, in no acute distress HEENT: normal, hard of hearing Neck: no JVD Vascular: No carotid bruits; FA pulses 2+ bilaterally without bruits  Cardiac:  normal S1, S2; RRR; 2/6 systolic murmur Lungs:  clear to auscultation bilaterally, no wheezing, rhonchi or rales  Abd: soft, nontender, no hepatomegaly  Ext: mild bilateral LEE Musculoskeletal:  No deformities Skin: warm and dry  Neuro:  no focal abnormalities noted Psych:  Normal affect   EKG:  The EKG was personally reviewed and demonstrates:  SR, As in HPI Telemetry:  Telemetry was personally reviewed and demonstrates:  Sinus brady, rates in 50s-60s. Pauses 2.55 seconds  Relevant CV Studies:  04/12/2018 TTE Study Conclusions - Left ventricle: The cavity size was normal. There was mild   concentric hypertrophy. Systolic function was normal. The   estimated  ejection fraction was in the range of 60% to 65%. Wall   motion was normal; there were no regional wall motion   abnormalities. Doppler parameters are consistent with abnormal   left ventricular relaxation (grade 1 diastolic dysfunction). - Left atrium: The atrium was mildly dilated. - Right ventricle: Systolic function was normal. - Pulmonary arteries: Systolic pressure was within the normal   range. - Pericardium, extracardiac: A trivial pericardial effusion was   identified.  02/2017 Holter Notes recorded by Minna Merritts, MD on 04/28/2017 at 11:39 AM EDT Event monitor with no significant arrhythmia  No significant arrhythmia Periods of bradycardia, rate into the low to mid 50s in the day, lower at night   Laboratory Data:  Chemistry Recent Labs  Lab 07/25/18 1049  NA 142  K 4.2  CL 108  CO2 28  GLUCOSE 117*  BUN 24*  CREATININE 0.97  CALCIUM 9.3  GFRNONAA 52*  GFRAA >60  ANIONGAP 6    No results for input(s): PROT, ALBUMIN, AST, ALT, ALKPHOS, BILITOT in the last 168 hours. Hematology Recent Labs  Lab 07/25/18 1049  WBC 5.9  RBC 4.17  HGB 13.1  HCT 39.8  MCV 95.4  MCH 31.4  MCHC  32.9  RDW 13.2  PLT 200   Cardiac Enzymes Recent Labs  Lab 07/25/18 1049  TROPONINI 0.25*   No results for input(s): TROPIPOC in the last 168 hours.  BNPNo results for input(s): BNP, PROBNP in the last 168 hours.  DDimer No results for input(s): DDIMER in the last 168 hours.  Radiology/Studies:  Dg Chest 2 View  Result Date: 07/25/2018 CLINICAL DATA:  Atrial fibrillation EXAM: CHEST - 2 VIEW COMPARISON:  None. FINDINGS: The heart is moderately enlarged. Normal vascularity. Clear lungs. Hyperaeration. IMPRESSION: Cardiomegaly without decompensation. Electronically Signed   By: Marybelle Killings M.D.   On: 07/25/2018 11:11    Assessment and Plan:   Elevated troponin concerning for NSTEMI in the setting of known paroxysmal Afib, normal 2015 catheterization -No chest pain  at any time. 2015 cath as above showing normal cors -Troponin elevation at presentation to ED and thought likely d/t demand ischemia in the setting of rapid ventricular rate with known atrial fibrillation; however, cannot r/o cardiac ischemia at this time. Will update lipid panel as last LDL not at goal. Continue to cycle troponin and monitor on telemetry. - EKG as above and similar to previous EKG / no acute changes. Ordered repeat EKG, given 2.55 second sinus pause on telemetry, to r/o heart block or acute changes consistent with cardiac ischemia. Pending orthostatics to assess chronotropic competence.  - Patient is anticoagulated on Xarelto with PRN diltiazem for Afib episodes. At this time, she remains in sinus rhythm. Self / family reported episode of Afib earlier today, as in HPI, for which she did not take the diltiazem and thought to have converted to SR by the time she reached the ED. She remains in SR but now with sinus pauses. Continue Xarelto. Holding diltiazem as not tachycardic and no indication for at this time.  - Continue to monitor BP given daughter's report of BP dropping while at home. Will check electrolytes and TSH. Continue losartan with hold parameters if renal insufficiency or patient becomes hypotensive. - Pending updated lipid panel, 2017 LDL 115 - Continue to observe on telemetry tonight and reevaluate for recurrent heart block, A. fib, posttermination pauses.  She is known to become bradycardic with termination pauses and rates into the 40s. Telemetry shows SR with pauses up to 2.55 seconds. Pending repeat EKG/orthostatics. If patient becomes hemodynamically unstable with bradycardia / pauses, consider EP referral for PPM given h/o tachybradycardia.    For questions or updates, please contact [facility] Please consult [email or web address] for contact info under     Signed, Arvil Chaco, PA-C  07/25/2018 2:25 PM

## 2018-07-25 NOTE — Telephone Encounter (Signed)
Patient c/o Palpitations:  High priority if patient c/o lightheadedness, shortness of breath, or chest pain  1) How long have you had palpitations/irregular HR/ Afib? Are you having the symptoms now? yes  2) Are you currently experiencing lightheadedness, SOB or CP? no  3) Do you have a history of afib (atrial fibrillation) or irregular heart rhythm? yes  4) Have you checked your BP or HR? (document readings if available): 132/78, sitting 112/60 HR 60-107  5) Are you experiencing any other symptoms? Weak and nauseated

## 2018-07-25 NOTE — Progress Notes (Addendum)
ANTICOAGULATION CONSULT NOTE - Initial Consult  Pharmacy Consult for Heparin drip Indication: atrial fibrillation  Allergies  Allergen Reactions  . Amiodarone Other (See Comments)    Chest discomfort  . Other Rash    oramycin    Patient Measurements: Height: 5\' 4"  (162.6 cm) Weight: 138 lb (62.6 kg) IBW/kg (Calculated) : 54.7 Heparin Dosing Weight: 62.6 kg  Vital Signs: Temp: 97.7 F (36.5 C) (12/12 1536) Temp Source: Oral (12/12 1536) BP: 99/63 (12/12 1625) Pulse Rate: 62 (12/12 1625)  Labs: Recent Labs    07/25/18 1049 07/25/18 1200 07/25/18 1510 07/25/18 1826  HGB 13.1  --   --   --   HCT 39.8  --   --   --   PLT 200  --   --   --   APTT  --  34  --  46*  LABPROT  --  14.6  --   --   INR  --  1.15  --   --   CREATININE 0.97  --   --   --   TROPONINI 0.25*  --  0.49*  --     Estimated Creatinine Clearance: 34.6 mL/min (by C-G formula based on SCr of 0.97 mg/dL).   Medical History: Past Medical History:  Diagnosis Date  . Allergic rhinitis   . Asthma   . Biceps tendon tear    a. right->s/p surgical repair winter of 2014.  . Diverticulosis of colon   . GERD (gastroesophageal reflux disease)   . Hammer toe of right foot   . Hx of colonic polyps   . HX: breast cancer   . Meniere's disease   . NSTEMI (non-ST elevated myocardial infarction) ([place])    a. 11/2012 in setting of rapid afib;  b. 11/2012 Cath: nl EF, nonobs CAD->Med Rx;  b. 11/2012 Echo: EF 60-65%, mild LVH, mild MR/TR.  . Osteoarthritis   . PAF (paroxysmal atrial fibrillation) ([place])    a. Eliquis and amio initiated 11/2012 in setting of admission with RVR and NSTEMI;  b. Subsequently changed to flecainide and xarelto;  c. 11/2013 Echo: EF 60-65%, mild LVH, mod MR/TR.  Marland Kitchen Sinus brady-tachy syndrome ([place])    a. Post-conversion pause of 7.2 seconds during 11/2012 hospitalization @ [facility]; b. 04/2017 Event Monitor: No significant arrhythmias.    Medications:  Scheduled:  . cholecalciferol  5,000 Units  Oral Daily  . donepezil  5 mg Oral QHS  . [START ON 07/26/2018] heparin  3,000 Units Intravenous Once  . losartan  50 mg Oral Daily  . mometasone-formoterol  2 puff Inhalation BID  . montelukast  10 mg Oral QPM  . multivitamin with minerals  1 tablet Oral Daily  . vitamin B-12  500 mcg Oral QODAY   Infusions:  . [START ON 07/26/2018] heparin      Assessment: 84 yo F on  Xarelto PTA for Afib. Last dose of Xarelto 12/11 @ 2000 PTA. TRansition to Heparin drip for possible PPM.  Xarelto dose actually given this afternoon 12/12 at 1646** Will check aPTT and Heparin level before starting Heparin drip tomorrow 12/13. Hgb 13.1  Plt 200  APTT    HL     Goal of Therapy:  Heparin level 0.3-0.7 units/ml  APTT  66-102 Monitor platelets by anticoagulation protocol: Yes   Plan:  For Crcl > 30 ml/min will start Heparin drip 24 hrs after last Xarelto dose  Will give 3000 unit bolus x 1 and start drip at 950 units/hr Will check  aPTT/HL to determine what to follow for adjustments.  **  Xarelto dose given today 12/12 at 1646, just before order was discontinued.- Will start Heparin 24 hours after this dose (so tomorrow 12/13 at 1630) **  Merrill,Kristin A 07/25/2018,7:08 PM

## 2018-07-25 NOTE — Telephone Encounter (Signed)
I attempted to return [name]'s call. I left a message to call back.

## 2018-07-25 NOTE — H&P (Signed)
[facility] [place] NAME: Leah Hayden    MR#:  [id number]  DATE OF BIRTH:  02-01-1928  DATE OF ADMISSION:  07/25/2018  PRIMARY CARE PHYSICIAN: Venia Carbon, MD   REQUESTING/REFERRING PHYSICIAN: Dr. Clearnce Hasten  CHIEF COMPLAINT:   Feeling weak, fluttering of the chest this morning. HISTORY OF PRESENT ILLNESS:  Leah Hayden  is a 84 y.o. female with a known history of atrial fibrillation chronic on Xarelto, sinus brady-tachy syndrome, history of MI with the EF of 60 to 65%, osteoarthritis comes to the emergency room after she woke up this morning having fluttering in her chest. Blood pressure taken at home was 10 6/66 repeat was systolic 99. Patient has been in sinus rhythm heart rate anywhere from 39 to 54. She is asymptomatic. Feeling weak. Denies any chest pressure or shortness of breath.  Daughter-in-law in the room. His any fever cough abdominal pain. Had nausea earlier but ate good lunch here in the ER. Patient's troponin was .25. She is being admitted for further evaluation and management.   PAST MEDICAL HISTORY:   Past Medical History:  Diagnosis Date  . Allergic rhinitis   . Asthma   . Biceps tendon tear    a. right->s/p surgical repair winter of 2014.  . Diverticulosis of colon   . GERD (gastroesophageal reflux disease)   . Hammer toe of right foot   . Hx of colonic polyps   . HX: breast cancer   . Meniere's disease   . NSTEMI (non-ST elevated myocardial infarction) ([place])    a. 11/2012 in setting of rapid afib;  b. 11/2012 Cath: nl EF, nonobs CAD->Med Rx;  b. 11/2012 Echo: EF 60-65%, mild LVH, mild MR/TR.  . Osteoarthritis   . PAF (paroxysmal atrial fibrillation) ([place])    a. Eliquis and amio initiated 11/2012 in setting of admission with RVR and NSTEMI;  b. Subsequently changed to flecainide and xarelto;  c. 11/2013 Echo: EF 60-65%, mild LVH, mod MR/TR.  Marland Kitchen Sinus brady-tachy syndrome ([place])    a. Post-conversion pause  of 7.2 seconds during 11/2012 hospitalization @ [facility]; b. 04/2017 Event Monitor: No significant arrhythmias.    PAST SURGICAL HISTOIRY:   Past Surgical History:  Procedure Laterality Date  . ABDOMINAL HYSTERECTOMY    . CARDIAC CATHETERIZATION  11/2013   [facility]  . [place]   2nd to left toe  . ESOPHAGOGASTRODUODENOSCOPY  10/1999   inflammation only   . FOREIGN BODY REMOVAL Right 01/02/2013   Procedure: FOREIGN BODY REMOVAL RIGHT INDEX FINGER;  Surgeon: Cammie Sickle., MD;  Location: [facility];  Service: Orthopedics;  Laterality: Right;  . HAMMER TOE SURGERY  11/15   Dr Milinda Pointer  . MASTECTOMY  1984   left   . MASTECTOMY  1985   right  . PARTIAL HYSTERECTOMY  1966   w/ bladder tack  . ROTATOR CUFF REPAIR  10/09   right   . TOE SURGERY  2001/2002   2nd/3rd,left     SOCIAL HISTORY:   Social History   Tobacco Use  . Smoking status: Never Smoker  . Smokeless tobacco: Never Used  Substance Use Topics  . Alcohol use: No    Alcohol/week: 0.0 standard drinks    FAMILY HISTORY:   Family History  Problem Relation Age of Onset  . Heart failure Mother   . Gout Mother   . Heart disease Mother  heart failure  . Arthritis Mother   . Heart failure Father   . Heart disease Father        heart failure  . Diabetes Maternal Grandmother   . Colon cancer Maternal Grandfather   . Cancer Maternal Grandfather        colon  . Arthritis Brother     DRUG ALLERGIES:   Allergies  Allergen Reactions  . Amiodarone Other (See Comments)    Chest discomfort  . Other Rash    oramycin    REVIEW OF SYSTEMS:  Review of Systems  Constitutional: Positive for malaise/fatigue. Negative for chills, fever and weight loss.  HENT: Negative for ear discharge, ear pain and nosebleeds.   Eyes: Negative for blurred vision, pain and discharge.  Respiratory: Negative for sputum production, shortness of breath, wheezing and stridor.   Cardiovascular: Positive  for palpitations. Negative for chest pain, orthopnea and PND.  Gastrointestinal: Negative for abdominal pain, diarrhea, nausea and vomiting.  Genitourinary: Negative for frequency and urgency.  Musculoskeletal: Negative for back pain and joint pain.  Neurological: Positive for weakness. Negative for sensory change, speech change and focal weakness.  Psychiatric/Behavioral: Negative for depression and hallucinations. The patient is not nervous/anxious.      MEDICATIONS AT HOME:   Prior to Admission medications   Medication Sig Start Date End Date Taking? Authorizing Provider  albuterol (PROVENTIL HFA) 108 (90 BASE) MCG/ACT inhaler Inhale 2 puffs into the lungs every 6 (six) hours as needed.     Yes [provider]  cholecalciferol (VITAMIN D) 1000 units tablet Take 5,000 Units by mouth daily.   Yes [provider]  cyanocobalamin 500 MCG tablet Take 500 mcg by mouth every other day.    Yes [provider]  diltiazem (CARDIZEM) 30 MG tablet Take 1 tablet (30 mg) by mouth every 6-8 hours as needed for breakthrough a-fib 04/19/18  Yes Gollan, Kathlene November, MD  donepezil (ARICEPT) 5 MG tablet TAKE 1 TABLET (5 MG TOTAL) BY MOUTH AT BEDTIME. 02/08/18  Yes Venia Carbon, MD  losartan (COZAAR) 50 MG tablet Take 1 tablet (50 mg total) by mouth daily. 05/14/18 07/25/18 Yes Gollan, Kathlene November, MD  montelukast (SINGULAIR) 10 MG tablet Take 10 mg by mouth every evening.    Yes [provider]  Multiple Vitamins-Minerals (CENTRUM SILVER 50+WOMEN) TABS Take 1 tablet by mouth daily.   Yes [provider]  nitroGLYCERIN (NITROSTAT) 0.4 MG SL tablet Place 1 tablet (0.4 mg total) under the tongue every 5 (five) minutes as needed for chest pain. 02/22/18  Yes Gollan, Kathlene November, MD  XARELTO 20 MG TABS tablet TAKE 1 TABLET BY MOUTH EVERY DAY WITH SUPPER Patient taking differently: Take 20 mg by mouth daily with supper.  06/05/18  Yes Gollan, Kathlene November, MD   Fluticasone-Salmeterol (ADVAIR DISKUS) 100-50 MCG/DOSE AEPB Inhale 1 puff into the lungs every 12 (twelve) hours.      [provider]      VITAL SIGNS:  Blood pressure 116/69, pulse 69, temperature 98 F (36.7 C), temperature source Oral, resp. rate 18, height 5\' 4"  (1.626 m), weight 62.6 kg, SpO2 98 %.  PHYSICAL EXAMINATION:  GENERAL:  84 y.o.-year-old patient lying in the bed with no acute distress.  EYES: Pupils equal, round, reactive to light and accommodation. No scleral icterus. Extraocular muscles intact.  HEENT: Head atraumatic, normocephalic. Oropharynx and nasopharynx clear.  NECK:  Supple, no jugular venous distention. No thyroid enlargement, no tenderness.  LUNGS: Normal breath sounds  bilaterally, no wheezing, rales,rhonchi or crepitation. No use of accessory muscles of respiration.  CARDIOVASCULAR: S1, S2 normal. No murmurs, rubs, or gallops.  ABDOMEN: Soft, nontender, nondistended. Bowel sounds present. No organomegaly or mass.  EXTREMITIES: No pedal edema, cyanosis, or clubbing.  NEUROLOGIC: Cranial nerves II through XII are intact. Muscle strength 5/5 in all extremities. Sensation intact. Gait not checked.  PSYCHIATRIC: The patient is alert and oriented x 3.  SKIN: No obvious rash, lesion, or ulcer.   LABORATORY PANEL:   CBC Recent Labs  Lab 07/25/18 1049  WBC 5.9  HGB 13.1  HCT 39.8  PLT 200   ------------------------------------------------------------------------------------------------------------------  Chemistries  Recent Labs  Lab 07/25/18 1049  NA 142  K 4.2  CL 108  CO2 28  GLUCOSE 117*  BUN 24*  CREATININE 0.97  CALCIUM 9.3   ------------------------------------------------------------------------------------------------------------------  Cardiac Enzymes Recent Labs  Lab 07/25/18 1049  TROPONINI 0.25*    ------------------------------------------------------------------------------------------------------------------  RADIOLOGY:  Dg Chest 2 View  Result Date: 07/25/2018 CLINICAL DATA:  Atrial fibrillation EXAM: CHEST - 2 VIEW COMPARISON:  None. FINDINGS: The heart is moderately enlarged. Normal vascularity. Clear lungs. Hyperaeration. IMPRESSION: Cardiomegaly without decompensation. Electronically Signed   By: Marybelle Killings M.D.   On: 07/25/2018 11:11    EKG:   Patient appears to be in sinus rhythm heart rate in the 79s--- is on telemetry IMPRESSION AND PLAN:   Leah Hayden  is a 84 y.o. female with a known history of atrial fibrillation chronic on Xarelto, sinus brady-tachy syndrome, history of MI with the EF of 60 to 65%, osteoarthritis comes to the emergency room after she woke up this morning having fluttering in her chest. Blood pressure taken at home was 10 6/66 repeat was systolic 99.  1. Palpitation in the setting of history of atrial fibrillation and history of sinus tachy-brady syndrome -patient's heart rate is anywhere from 40 to 54 in the emergency room blood pressure is stable -she takes diltiazem on as needed basis I will hold it -continue losartan -cardiology consultation with Dr. Fletcher Anon  2. Elevated troponin appears demand ischemia -cycle cardiac enzymes times three -further recommendations per cardiology  3. Generalized weakness -physical therapy to see patient  4. DVT prophylaxis on Xarelto  Discussed with patient and daughter in law   All the records are reviewed and case discussed with ED provider. Management plans discussed with the patient, family and they are in agreement.  CODE STATUS: DNR  TOTAL TIME TAKING CARE OF THIS PATIENT: 50 minutes.    Fritzi Mandes M.D on 07/25/2018 at 1:54 PM  Between 7am to 6pm - Pager - [phone]  After 6pm go to [email or web address] - password [email or web address] [facility]  [facility]  Office  [phone]  CC: Primary care  physician; Venia Carbon, MD

## 2018-07-25 NOTE — ED Provider Notes (Signed)
[facility] Emergency Department Provider Note  ___________________________________________   First MD Initiated Contact with Patient 07/25/18 1159     (approximate)  I have reviewed the triage vital signs and the nursing notes.   HISTORY  Chief Complaint Irregular Heart Beat   HPI Leah Hayden is a 84 y.o. female with a history of atrial fibrillation, on Xarelto, who is presenting to the emergency department today with palpitations and nausea that started around 4:00 this morning.  She denies any symptoms at this time.  Is unable to recall exactly how long the symptoms persisted.  Does not report any shortness of breath.   Past Medical History:  Diagnosis Date  . Allergic rhinitis   . Asthma   . Biceps tendon tear    a. right->s/p surgical repair winter of 2014.  . Diverticulosis of colon   . GERD (gastroesophageal reflux disease)   . Hammer toe of right foot   . Hx of colonic polyps   . HX: breast cancer   . Meniere's disease   . NSTEMI (non-ST elevated myocardial infarction) ([place])    a. 11/2012 in setting of rapid afib;  b. 11/2012 Cath: nl EF, nonobs CAD->Med Rx;  b. 11/2012 Echo: EF 60-65%, mild LVH, mild MR/TR.  . Osteoarthritis   . PAF (paroxysmal atrial fibrillation) ([place])    a. Eliquis and amio initiated 11/2012 in setting of admission with RVR and NSTEMI;  b. Subsequently changed to flecainide and xarelto;  c. 11/2013 Echo: EF 60-65%, mild LVH, mod MR/TR.  Marland Kitchen Sinus brady-tachy syndrome ([place])    a. Post-conversion pause of 7.2 seconds during 11/2012 hospitalization @ [facility]; b. 04/2017 Event Monitor: No significant arrhythmias.    Patient Active Problem List   Diagnosis Date Noted  . Mood disorder ([place]) 06/26/2018  . Chronic kidney disease (CKD), stage III (moderate) ([place]) 06/26/2018  . Arrhythmia 04/12/2018  . Vitamin B12 deficiency 11/10/2015  . Vascular dementia, uncomplicated ([place]) 20/94/7096  . Advance directive discussed with patient  06/07/2015  . Hyperlipidemia 05/13/2014  . PAF (paroxysmal atrial fibrillation) ([place])   . Routine general medical examination at a health care facility 05/03/2012  . SVT/ PSVT/ PAT 01/10/2010  . Sick sinus syndrome ([place]) 01/10/2010  . Generalized osteoarthrosis, involving multiple sites 12/18/2007  . ALLERGIC RHINITIS 03/07/2007  . Mild intermittent asthma in adult without complication 28/36/6294  . GERD 03/07/2007  . DIVERTICULOSIS, COLON 03/07/2007  . BREAST CANCER, HX OF 03/07/2007    Past Surgical History:  Procedure Laterality Date  . ABDOMINAL HYSTERECTOMY    . CARDIAC CATHETERIZATION  [facility]   [facility]  . [place]   2nd to left toe  . ESOPHAGOGASTRODUODENOSCOPY  10/1999   inflammation only   . FOREIGN BODY REMOVAL Right 01/02/2013   Procedure: FOREIGN BODY REMOVAL RIGHT INDEX FINGER;  Surgeon: Cammie Sickle., MD;  Location: [facility];  Service: Orthopedics;  Laterality: Right;  . HAMMER TOE SURGERY  11/15   Dr Milinda Pointer  . MASTECTOMY  1984   left   . MASTECTOMY  1985   right  . PARTIAL HYSTERECTOMY  1966   w/ bladder tack  . ROTATOR CUFF REPAIR  10/09   right   . TOE SURGERY  2001/2002   2nd/3rd,left     Prior to Admission medications   Medication Sig Start Date End Date Taking? Authorizing Provider  albuterol (PROVENTIL HFA) 108 (90 BASE) MCG/ACT inhaler Inhale 2 puffs into the lungs every  6 (six) hours as needed.     Yes [provider]  cholecalciferol (VITAMIN D) 1000 units tablet Take 5,000 Units by mouth daily.   Yes [provider]  cyanocobalamin 500 MCG tablet Take 500 mcg by mouth every other day.    Yes [provider]  diltiazem (CARDIZEM) 30 MG tablet Take 1 tablet (30 mg) by mouth every 6-8 hours as needed for breakthrough a-fib 04/19/18  Yes Gollan, Kathlene November, MD  donepezil (ARICEPT) 5 MG tablet TAKE 1 TABLET (5 MG TOTAL) BY MOUTH AT BEDTIME. 02/08/18  Yes Venia Carbon, MD  losartan  (COZAAR) 50 MG tablet Take 1 tablet (50 mg total) by mouth daily. 05/14/18 07/25/18 Yes Gollan, Kathlene November, MD  montelukast (SINGULAIR) 10 MG tablet Take 10 mg by mouth every evening.    Yes [provider]  Multiple Vitamins-Minerals (CENTRUM SILVER 50+WOMEN) TABS Take 1 tablet by mouth daily.   Yes [provider]  nitroGLYCERIN (NITROSTAT) 0.4 MG SL tablet Place 1 tablet (0.4 mg total) under the tongue every 5 (five) minutes as needed for chest pain. 02/22/18  Yes Gollan, Kathlene November, MD  XARELTO 20 MG TABS tablet TAKE 1 TABLET BY MOUTH EVERY DAY WITH SUPPER Patient taking differently: Take 20 mg by mouth daily with supper.  06/05/18  Yes Gollan, Kathlene November, MD  Fluticasone-Salmeterol (ADVAIR DISKUS) 100-50 MCG/DOSE AEPB Inhale 1 puff into the lungs every 12 (twelve) hours.      [provider]    Allergies Amiodarone and Other  Family History  Problem Relation Age of Onset  . Heart failure Mother   . Gout Mother   . Heart disease Mother        heart failure  . Arthritis Mother   . Heart failure Father   . Heart disease Father        heart failure  . Diabetes Maternal Grandmother   . Colon cancer Maternal Grandfather   . Cancer Maternal Grandfather        colon  . Arthritis Brother     Social History Social History   Tobacco Use  . Smoking status: Never Smoker  . Smokeless tobacco: Never Used  Substance Use Topics  . Alcohol use: No    Alcohol/week: 0.0 standard drinks  . Drug use: No    Review of Systems  Constitutional: No fever/chills Eyes: No visual changes. ENT: No sore throat. Cardiovascular: Denies chest pain. Respiratory: Denies shortness of breath. Gastrointestinal: No abdominal pain.  No nausea, no vomiting.  No diarrhea.  No constipation. Genitourinary: Negative for dysuria. Musculoskeletal: Negative for back pain. Skin: Negative for rash. Neurological: Negative for headaches, focal weakness or  numbness.   ____________________________________________   PHYSICAL EXAM:  VITAL SIGNS: ED Triage Vitals  Enc Vitals Group     BP 07/25/18 1047 116/69     Pulse Rate 07/25/18 1047 69     Resp 07/25/18 1047 18     Temp 07/25/18 1047 98 F (36.7 C)     Temp Source 07/25/18 1047 Oral     SpO2 07/25/18 1047 98 %     Weight 07/25/18 1042 138 lb (62.6 kg)     Height 07/25/18 1042 5\' 4"  (1.626 m)     Head Circumference --      Peak Flow --      Pain Score 07/25/18 1042 0     Pain Loc --      Pain Edu? --      Excl.  in [place]? --     Constitutional: Alert and oriented. Well appearing and in no acute distress. Eyes: Conjunctivae are normal.  Head: Atraumatic. Nose: No congestion/rhinnorhea. Mouth/Throat: Mucous membranes are moist.  Neck: No stridor.   Cardiovascular: Normal rate, regular rhythm. Grossly normal heart sounds.   Respiratory: Normal respiratory effort.  No retractions. Lungs CTAB. Gastrointestinal: Soft and nontender. No distention. No CVA tenderness. Musculoskeletal: No lower extremity tenderness nor edema.  No joint effusions. Neurologic:  Normal speech and language. No gross focal neurologic deficits are appreciated. Skin:  Skin is warm, dry and intact. No rash noted. Psychiatric: Mood and affect are normal. Speech and behavior are normal.  ____________________________________________   LABS (all labs ordered are listed, but only abnormal results are displayed)  Labs Reviewed  BASIC METABOLIC PANEL - Abnormal; Notable for the following components:      Result Value   Glucose, Bld 117 (*)    BUN 24 (*)    GFR calc non Af Amer 52 (*)    All other components within normal limits  TROPONIN I - Abnormal; Notable for the following components:   Troponin I 0.25 (*)    All other components within normal limits  CBC  PROTIME-INR  APTT   ____________________________________________  EKG  ED ECG REPORT I, Doran Stabler, the attending physician,  personally viewed and interpreted this ECG.   Date: 07/25/2018  EKG Time: 1044  Rate: 69  Rhythm: normal sinus rhythm  Axis: Normal  Intervals:none  ST&T Change: No ST segment elevation or depression.  T wave inversions in V2 and V3.  ____________________________________________  RADIOLOGY  Chest x-ray with cardiomegaly without decompensation. ____________________________________________   PROCEDURES  Procedure(s) performed:   Procedures  Critical Care performed:   ____________________________________________   INITIAL IMPRESSION / ASSESSMENT AND PLAN / ED COURSE  Pertinent labs & imaging results that were available during my care of the patient were reviewed by me and considered in my medical decision making (see chart for details).  Differential diagnosis includes, but is not limited to, ACS, aortic dissection, pulmonary embolism, cardiac tamponade, pneumothorax, pneumonia, pericarditis, myocarditis, GI-related causes including esophagitis/gastritis, and musculoskeletal chest wall pain.   As part of my medical decision making, I reviewed the following data within the electronic MEDICAL RECORD NUMBER Notes from prior ED visits  ----------------------------------------- 1:21 PM on 07/25/2018 -----------------------------------------  Patient at this time continues to be asymptomatic.  Attempted call out to the patient's cardiologist at the [facility] cardiology office but there was no callback.  Patient appears to have, normally, a negative troponin and has been on 2.07.0 weight in the past.  However, the level 0.25 is significantly higher than on recent record.  Will be admitted to the hospital.  Signed out to Dr. Earleen Newport.  ____________________________________________   FINAL CLINICAL IMPRESSION(S) / ED DIAGNOSES  Palpitations  NEW MEDICATIONS STARTED DURING THIS VISIT:  New Prescriptions   No medications on file     Note:  This document was prepared using Dragon voice  recognition software and may include unintentional dictation errors.     Orbie Pyo, MD 07/25/18 1322

## 2018-07-26 DIAGNOSIS — I248 Other forms of acute ischemic heart disease: Secondary | ICD-10-CM

## 2018-07-26 LAB — BASIC METABOLIC PANEL
Anion gap: 5 (ref 5–15)
BUN: 28 mg/dL — ABNORMAL HIGH (ref 8–23)
CO2: 27 mmol/L (ref 22–32)
Calcium: 8.8 mg/dL — ABNORMAL LOW (ref 8.9–10.3)
Chloride: 108 mmol/L (ref 98–111)
Creatinine, Ser: 0.95 mg/dL (ref 0.44–1.00)
GFR calc Af Amer: >60 mL/min (ref >60)
GFR calc non Af Amer: 53 mL/min — ABNORMAL LOW (ref >60)
Glucose, Bld: 94 mg/dL (ref 70–99)
Potassium: 4.2 mmol/L (ref 3.5–5.1)
Sodium: 140 mmol/L (ref 135–145)

## 2018-07-26 LAB — CBC
HCT: 34.5 % — ABNORMAL LOW (ref 36.0–46.0)
Hemoglobin: 11.2 g/dL — ABNORMAL LOW (ref 12.0–15.0)
MCH: 31.3 pg (ref 26.0–34.0)
MCHC: 32.5 g/dL (ref 30.0–36.0)
MCV: 96.4 fL (ref 80.0–100.0)
Platelets: 181 10*3/uL (ref 150–400)
RBC: 3.58 MIL/uL — ABNORMAL LOW (ref 3.87–5.11)
RDW: 13.1 % (ref 11.5–15.5)
WBC: 5.3 10*3/uL (ref 4.0–10.5)
nRBC: 0 % (ref 0.0–0.2)

## 2018-07-26 LAB — TROPONIN I: Troponin I: 0.55 ng/mL (ref <0.03)

## 2018-07-26 LAB — HEMOGLOBIN AND HEMATOCRIT, BLOOD
HCT: 37.8 % (ref 36.0–46.0)
Hemoglobin: 12.4 g/dL (ref 12.0–15.0)

## 2018-07-26 MED ORDER — PANTOPRAZOLE SODIUM 40 MG PO TBEC
40.0000 mg | DELAYED_RELEASE_TABLET | Freq: Every day | ORAL | Status: DC
  Administered 2018-07-26 – 2018-07-27 (×2): 40 mg via ORAL
  Filled 2018-07-26 (×2): qty 1

## 2018-07-26 MED ORDER — DILTIAZEM HCL 30 MG PO TABS: 30.0000 mg | ORAL_TABLET | ORAL | Status: DC | PRN

## 2018-07-26 NOTE — Telephone Encounter (Signed)
No call back received back from the patient's family. Reviewed the patient's chart and she did go to the ER on 12/12 for further evaluation and was admitted. Will close this encounter.

## 2018-07-26 NOTE — Progress Notes (Signed)
[facility] [place] NAME: Leah Hayden    MR#:  [id number]  DATE OF BIRTH:  1928/01/06  SUBJECTIVE:  CHIEF COMPLAINT: Patient is resting comfortably denies any chest pain.  Daughter in Sports coach and Son at bedside.  Reporting melena.  Denies any abdominal pain.  REVIEW OF SYSTEMS:  CONSTITUTIONAL: No fever, fatigue or weakness.  EYES: No blurred or double vision.  EARS, NOSE, AND THROAT: No tinnitus or ear pain.  RESPIRATORY: No cough, shortness of breath, wheezing or hemoptysis.  CARDIOVASCULAR: No chest pain, orthopnea, edema.  GASTROINTESTINAL: No nausea, vomiting, diarrhea or abdominal pain.  Reporting melena GENITOURINARY: No dysuria, hematuria.  ENDOCRINE: No polyuria, nocturia,  HEMATOLOGY: No anemia, easy bruising or bleeding SKIN: No rash or lesion. MUSCULOSKELETAL: No joint pain or arthritis.   NEUROLOGIC: No tingling, numbness, weakness.  PSYCHIATRY: No anxiety or depression.   DRUG ALLERGIES:   Allergies  Allergen Reactions  . Amiodarone Other (See Comments)    Chest discomfort  . Other Rash    oramycin    VITALS:  Blood pressure (!) 135/55, pulse (!) 56, temperature 98.3 F (36.8 C), temperature source Oral, resp. rate 18, height 5\' 4"  (1.626 m), weight 62.6 kg, SpO2 96 %.  PHYSICAL EXAMINATION:  GENERAL:  84 y.o.-year-old patient lying in the bed with no acute distress.  EYES: Pupils equal, round, reactive to light and accommodation. No scleral icterus. Extraocular muscles intact.  HEENT: Head atraumatic, normocephalic. Oropharynx and nasopharynx clear.  NECK:  Supple, no jugular venous distention. No thyroid enlargement, no tenderness.  LUNGS: Normal breath sounds bilaterally, no wheezing, rales,rhonchi or crepitation. No use of accessory muscles of respiration.  CARDIOVASCULAR: S1, S2 normal. No murmurs, rubs, or gallops.  ABDOMEN: Soft, nontender, nondistended. Bowel sounds present. EXTREMITIES: No pedal  edema, cyanosis, or clubbing.  NEUROLOGIC: Awake, alert and oriented x3 sensation intact. Gait not checked.  PSYCHIATRIC: The patient is alert and oriented x 3.  SKIN: No obvious rash, lesion, or ulcer.    LABORATORY PANEL:   CBC Recent Labs  Lab 07/26/18 0228  WBC 5.3  HGB 11.2*  HCT 34.5*  PLT 181   ------------------------------------------------------------------------------------------------------------------  Chemistries  Recent Labs  Lab 07/25/18 1510 07/26/18 0228  NA  --  140  K  --  4.2  CL  --  108  CO2  --  27  GLUCOSE  --  94  BUN  --  28*  CREATININE  --  0.95  CALCIUM  --  8.8*  MG 1.9  --    ------------------------------------------------------------------------------------------------------------------  Cardiac Enzymes Recent Labs  Lab 07/26/18 0228  TROPONINI 0.55*   ------------------------------------------------------------------------------------------------------------------  RADIOLOGY:  Dg Chest 2 View  Result Date: 07/25/2018 CLINICAL DATA:  Atrial fibrillation EXAM: CHEST - 2 VIEW COMPARISON:  None. FINDINGS: The heart is moderately enlarged. Normal vascularity. Clear lungs. Hyperaeration. IMPRESSION: Cardiomegaly without decompensation. Electronically Signed   By: Marybelle Killings M.D.   On: 07/25/2018 11:11    EKG:   Orders placed or performed during the hospital encounter of 07/25/18  . ED EKG within 10 minutes  . ED EKG within 10 minutes  . EKG 12-Lead  . EKG 12-Lead  . EKG 12-Lead  . EKG 12-Lead    ASSESSMENT AND PLAN:    Leah Hayden  is a 84 y.o. female with a known history of atrial fibrillation chronic on Xarelto, sinus brady-tachy syndrome, history of MI with the EF of 60 to 65%, osteoarthritis comes to  the emergency room after she woke up this morning having fluttering in her chest. Blood pressure taken at home was 10 6/66 repeat was systolic 99.  1. Palpitation in the setting of history of atrial fibrillation and  history of sinus tachy-brady syndrome -patient's heart rate is anywhere from 40 to 54 in the emergency room blood pressure is stable -Seen by dr.Arida  recommending diltiazem 30 mg on as needed basis A two-week ZIO patch monitor as an outpatient follow-up with electrophysiology -continue losartan -cardiology consultation with Dr. Fletcher Anon  2. Elevated troponin appears demand ischemia -cycle cardiac enzymes times three -further recommendations per cardiology  3. Generalized weakness -physical therapy to see patient  4.   Melena Check stool for occult blood Hemoglobin trended down We will monitor hemoglobin hematocrit and check CBC in a.m. Hold Xarelto Daughter-in-law recommended to hold on the GI consult until tomorrow and if patient's occult blood is positive they are okay with the GI consult PPI started  DVT prophylaxis on Xarelto    All the records are reviewed and case discussed with Care Management/Social Workerr. Management plans discussed with the patient, family son and daughter-in-law at bedside and they are in agreement.  CODE STATUS:   TOTAL TIME TAKING CARE OF THIS PATIENT: 36 minutes.   POSSIBLE D/C IN 1-2 DAYS, DEPENDING ON CLINICAL CONDITION.  Note: This dictation was prepared with Dragon dictation along with smaller phrase technology. Any transcriptional errors that result from this process are unintentional.   Leah Hayden M.D on 07/26/2018 at 4:25 PM  Between 7am to 6pm - Pager - [phone] After 6pm go to [email or web address] - password [email or web address] [facility]  Office  [phone]  CC: Primary care physician; Venia Carbon, MD

## 2018-07-26 NOTE — Progress Notes (Signed)
Progress Note  Patient Name: Leah Hayden Date of Encounter: 07/26/2018  Primary Cardiologist: Ida Rogue, MD   Subjective   She denied chest pain, palpitations, or symptoms of near syncope.  She did report food poisoning and diarrhea 12/11, as well as stools that are darker than usual since that time.   Inpatient Medications    Scheduled Meds: . cholecalciferol  5,000 Units Oral Daily  . donepezil  5 mg Oral QHS  . heparin  3,000 Units Intravenous Once  . losartan  50 mg Oral Daily  . mometasone-formoterol  2 puff Inhalation BID  . montelukast  10 mg Oral QPM  . multivitamin with minerals  1 tablet Oral Daily  . vitamin B-12  500 mcg Oral QODAY   Continuous Infusions: . heparin     PRN Meds: acetaminophen **OR** acetaminophen, albuterol, albuterol, nitroGLYCERIN, ondansetron **OR** ondansetron (ZOFRAN) IV, polyethylene glycol   Vital Signs    Vitals:   07/25/18 1623 07/25/18 1625 07/25/18 2012 07/26/18 0542  BP: 112/69 99/63 (!) 103/41 134/63  Pulse: (!) 55 62 (!) 52 (!) 52  Resp:   17 17  Temp:   98.2 F (36.8 C) 97.9 F (36.6 C)  TempSrc:   Oral Oral  SpO2:   99% 99%  Weight:      Height:        Intake/Output Summary (Last 24 hours) at 07/26/2018 0740 Last data filed at 07/25/2018 1846 Gross per 24 hour  Intake 360 ml  Output -  Net 360 ml   Filed Weights   07/25/18 1042  Weight: 62.6 kg    Telemetry    Sinus bradycardia, PVCs- Personally Reviewed Earlier telemetry showed sinus pauses with longest pause 2.55 seconds and patient asymptomatic.  ECG    Repeat EKG 07/25/2018 Sinus bradycardia, TWI in inferior leads   - Personally Reviewed  Physical Exam   GEN: Elderly female, hard of hearing.   Neck: No JVD Cardiac: RRR, 2/6 systolic murmur, rubs, or gallops.  Respiratory: Clear to auscultation bilaterally. GI: Soft, nontender, non-distended  MS: Trivial bilateral edema; No deformity. Neuro:  Nonfocal, memory issues  Psych: Normal  affect   Labs    Chemistry Recent Labs  Lab 07/25/18 1049  NA 142  K 4.2  CL 108  CO2 28  GLUCOSE 117*  BUN 24*  CREATININE 0.97  CALCIUM 9.3  GFRNONAA 52*  GFRAA >60  ANIONGAP 6     Hematology Recent Labs  Lab 07/25/18 1049 07/26/18 0228  WBC 5.9 5.3  RBC 4.17 3.58*  HGB 13.1 11.2*  HCT 39.8 34.5*  MCV 95.4 96.4  MCH 31.4 31.3  MCHC 32.9 32.5  RDW 13.2 13.1  PLT 200 181    Cardiac Enzymes Recent Labs  Lab 07/25/18 1049 07/25/18 1510 07/25/18 2054 07/26/18 0228  TROPONINI 0.25* 0.49* 0.56* 0.55*   No results for input(s): TROPIPOC in the last 168 hours.   BNPNo results for input(s): BNP, PROBNP in the last 168 hours.   DDimer No results for input(s): DDIMER in the last 168 hours.   Radiology    Dg Chest 2 View  Result Date: 07/25/2018 CLINICAL DATA:  Atrial fibrillation EXAM: CHEST - 2 VIEW COMPARISON:  None. FINDINGS: The heart is moderately enlarged. Normal vascularity. Clear lungs. Hyperaeration. IMPRESSION: Cardiomegaly without decompensation. Electronically Signed   By: Marybelle Killings M.D.   On: 07/25/2018 11:11    Cardiac Studies   04/12/2018 TTE Study Conclusions - Left ventricle: The cavity size was  normal. There was mild concentric hypertrophy. Systolic function was normal. The estimated ejection fraction was in the range of 60% to 65%. Wall motion was normal; there were no regional wall motion abnormalities. Doppler parameters are consistent with abnormal left ventricular relaxation (grade 1 diastolic dysfunction). - Left atrium: The atrium was mildly dilated. - Right ventricle: Systolic function was normal. - Pulmonary arteries: Systolic pressure was within the normal range. - Pericardium, extracardiac: A trivial pericardial effusion was identified.  02/2017 Holter Notes recorded by Minna Merritts, MD on 04/28/2017 at 11:39 AM EDT Event monitor with no significant arrhythmia  No significant arrhythmia Periods  of bradycardia, rate into the low to mid 50s in the day, lower at night  Patient Profile     84 y.o. female with a history of paroxysmal atrial fibrillation on Xarelto and PRN diltiazem, tachybradycardia syndrome, NSTEMI in April 2014 with nonobstructive CAD on catheterization, PSVT, GERD, asthma, dementia/memory loss, and breast cancer who is being seen today for the evaluation of elevated troponin.  Assessment & Plan    Elevated troponin -No reported chest pain. -Troponin minimally elevated at presentation, now trending down 0.25  0.49  0.56  0.55 -EKG without acute changes, sinus bradycardia, TWI and ST depression in inferior leads consistent with previous EKG. -Lipid panel 12/12 shows LDL 92 -Minimally elevated troponin with 2015 cath showing no significant disease after patient admitted to hospital with similar presentation. Patient continues to deny chest pain. Troponin down-trending. No plan for cardiac catheterization this admission. Patient is not an ideal candidate for catheterization given her age, dementia, and comorbid conditions. Consider also troponin elevation in the setting of supply demand ischemia with episode of elevated HR at home, as well as patient reporting diarrhea thought related to food poisoning and occurring 07/24/2018. Recommend discontinue heparin at this time, given no plan for invasive ischemic workup.  Sinus Bradycardia, Sinus Pauses; H/o Afib and tachybradycardia / SSS -Telemetry sinus bradycardia, sinus pauses. No tachycardia seen since admission. Patient did not take diltiazem before presentation to ED. Consider etiology of most recent episode due to patient reported food poisoning and episode of diarrhea on 12/11. -Electrolytes normal with Mg 1.9, K 4.2. TSH 0.851 -Patient asymptomatic, hemodynamically stable. Orthostatics checked and in chart -Continue to monitor on telemetry. Could consider outpatient holter monitor and EP evaluation as patient will likely  need PPM if becomes hemodynamically unstable. At this time, however, sinus pauses remain below 3 seconds, no tachybradycardia seen on telemetry, and patient is asymptomatic and stable. No plan for PPM at this time.   Anemia -Hemoglobin 13.1  11.2; Platelets 200  181 -Will stop heparin given no plan for invasive ischemic workup as above. -Consider FOBT or repeat CBC to ensure no bleed, given patient report of diarrhea and darker stools within the last few days. Xarelto was held earlier and can be restarted if no bleed.  Dementia - Per IM  For questions or updates, please contact [facility] Please consult [email or web address] for contact info under        Signed, Arvil Chaco, PA-C  07/26/2018, 7:40 AM

## 2018-07-27 LAB — CBC
HCT: 35.8 % — ABNORMAL LOW (ref 36.0–46.0)
Hemoglobin: 11.8 g/dL — ABNORMAL LOW (ref 12.0–15.0)
MCH: 31.1 pg (ref 26.0–34.0)
MCHC: 33 g/dL (ref 30.0–36.0)
MCV: 94.2 fL (ref 80.0–100.0)
Platelets: 173 10*3/uL (ref 150–400)
RBC: 3.8 MIL/uL — ABNORMAL LOW (ref 3.87–5.11)
RDW: 12.9 % (ref 11.5–15.5)
WBC: 4.8 10*3/uL (ref 4.0–10.5)
nRBC: 0 % (ref 0.0–0.2)

## 2018-07-27 MED ORDER — PANTOPRAZOLE SODIUM 40 MG PO TBEC: 40.0000 mg | DELAYED_RELEASE_TABLET | Freq: Every day | ORAL | 0 refills | Status: DC

## 2018-07-27 NOTE — Discharge Summary (Signed)
[facility] [place] [facility] [place], 84 y.o., DOB May 29, 1928, MRN [id number]. Admission date: 07/25/2018 Discharge Date 07/27/2018 Primary MD Venia Carbon, MD Admitting Physician Fritzi Mandes, MD  Admission Diagnosis  Palpitations [R00.2]  Discharge Diagnosis   Active Problems: Palpitations in the setting of paroxysmal atrial fibrillation Evaded troponin due to demand ischemia Dark-colored stools I checked her guaiac today personally showed no stool offered patient to follow-up with GI they want to defer that for now Generalized weakness Anemia  Hospital Course  HazelAppleis a89 y.o.femalewith a known history of atrial fibrillation chronic on Xarelto, sinus brady-tachysyndrome,history of MI with the EF of 60 to 65%, osteoarthritis comes to the emergency room after she woke up this morning having fluttering in her chest. Blood pressure taken at home was 106/66 repeat was systolic 99.  Patient was admitted for further evaluation.  She was seen by cardiology recommended to continue her regimen.  Patient also was noted to have drop in hemoglobin with fluids.  There is no evidence of GI bleed patient I guaiac patient's stools personally showed no guaiac positive.  If patient continues to have dark stools she needs to follow-up outpatient.  Meant CBC check next week.            Consults  None  Significant Tests:  See full reports for all details     Dg Chest 2 View  Result Date: 07/25/2018 CLINICAL DATA:  Atrial fibrillation EXAM: CHEST - 2 VIEW COMPARISON:  None. FINDINGS: The heart is moderately enlarged. Normal vascularity. Clear lungs. Hyperaeration. IMPRESSION: Cardiomegaly without decompensation. Electronically Signed   By: Marybelle Killings M.D.   On: 07/25/2018 11:11   Dg Chest Portable 1 View  Result Date: 07/01/2018 CLINICAL DATA:  Weakness. EXAM: PORTABLE CHEST 1 VIEW COMPARISON:  Radiograph of October 21, 2014. FINDINGS: Stable  cardiomediastinal silhouette. No pneumothorax or pleural effusion is noted. Atherosclerosis of thoracic aorta is noted. Both lungs are clear. The visualized skeletal structures are unremarkable. IMPRESSION: No acute cardiopulmonary abnormality seen. Aortic Atherosclerosis (ICD10-I70.0). Electronically Signed   By: Marijo Conception, M.D.   On: 07/01/2018 10:03       Today   Subjective:   Leah Hayden patient denies any complaints Objective:   Blood pressure (!) 148/67, pulse 64, temperature 98.2 F (36.8 C), resp. rate 20, height 5\' 4"  (1.626 m), weight 60.1 kg, SpO2 97 %.  .  Intake/Output Summary (Last 24 hours) at 07/27/2018 1433 Last data filed at 07/27/2018 0310 Gross per 24 hour  Intake 120 ml  Output 850 ml  Net -730 ml    Exam VITAL SIGNS: Blood pressure (!) 148/67, pulse 64, temperature 98.2 F (36.8 C), resp. rate 20, height 5\' 4"  (1.626 m), weight 60.1 kg, SpO2 97 %.  GENERAL:  84 y.o.-year-old patient lying in the bed with no acute distress.  EYES: Pupils equal, round, reactive to light and accommodation. No scleral icterus. Extraocular muscles intact.  HEENT: Head atraumatic, normocephalic. Oropharynx and nasopharynx clear.  NECK:  Supple, no jugular venous distention. No thyroid enlargement, no tenderness.  LUNGS: Normal breath sounds bilaterally, no wheezing, rales,rhonchi or crepitation. No use of accessory muscles of respiration.  CARDIOVASCULAR: S1, S2 normal. No murmurs, rubs, or gallops.  ABDOMEN: Soft, nontender, nondistended. Bowel sounds present. No organomegaly or mass.  EXTREMITIES: No pedal edema, cyanosis, or clubbing.  NEUROLOGIC: Cranial nerves II through XII are intact. Muscle strength 5/5 in all extremities. Sensation intact. Gait not checked.  PSYCHIATRIC:  The patient is alert and oriented x 3.  SKIN: No obvious rash, lesion, or ulcer.   Data Review     CBC w Diff:  Lab Results  Component Value Date   WBC 4.8 07/27/2018   HGB 11.8 (L)  07/27/2018   HGB 12.0 11/29/2013   HCT 35.8 (L) 07/27/2018   HCT 36.0 11/29/2013   PLT 173 07/27/2018   PLT 156 11/29/2013   LYMPHOPCT 26 07/01/2018   LYMPHOPCT 24.6 11/29/2013   MONOPCT 7 07/01/2018   MONOPCT 8.6 11/29/2013   EOSPCT 2 07/01/2018   EOSPCT 2.8 11/29/2013   BASOPCT 0 07/01/2018   BASOPCT 0.4 11/29/2013   CMP:  Lab Results  Component Value Date   NA 140 07/26/2018   NA 142 11/28/2013   K 4.2 07/26/2018   K 4.1 11/28/2013   CL 108 07/26/2018   CL 112 (H) 11/28/2013   CO2 27 07/26/2018   CO2 28 11/28/2013   BUN 28 (H) 07/26/2018   BUN 18 11/28/2013   CREATININE 0.95 07/26/2018   CREATININE 0.68 11/28/2013   PROT 6.6 07/01/2018   PROT 6.2 01/16/2014   PROT 7.1 11/27/2013   ALBUMIN 3.8 07/01/2018   ALBUMIN 4.3 01/16/2014   ALBUMIN 3.5 11/27/2013   BILITOT 1.3 (H) 07/01/2018   BILITOT 0.8 11/27/2013   ALKPHOS 66 07/01/2018   ALKPHOS 81 11/27/2013   AST 30 07/01/2018   AST 22 11/27/2013   ALT 19 07/01/2018   ALT 22 11/27/2013  .  Micro Results No results found for this or any previous visit (from the past 240 hour(s)).      Code Status Orders  (From admission, onward)         Start     Ordered   07/25/18 1443  Do not attempt resuscitation (DNR)  Continuous    Question Answer Comment  In the event of cardiac or respiratory ARREST Do not call a "code blue"   In the event of cardiac or respiratory ARREST Do not perform Intubation, CPR, defibrillation or ACLS   In the event of cardiac or respiratory ARREST Use medication by any route, position, wound care, and other measures to relive pain and suffering. May use oxygen, suction and manual treatment of airway obstruction as needed for comfort.   Comments Nurse may pronounce      07/25/18 1442        Code Status History    Date Active Date Inactive Code Status Order ID Comments User Context   04/12/2018 1634 04/15/2018 1725 DNR [id number]  SalaryAvel Peace, MD Inpatient    Advance Directive  Documentation     Most Recent Value  Type of Advance Directive  Healthcare Power of Attorney, Living will  Pre-existing out of facility DNR order (yellow form or pink MOST form)  -  "MOST" Form in Place?  -          Follow-up Information    Viviana Simpler I, MD Follow up in 6 day(s).   Specialties:  Internal Medicine, Pediatrics Why:  check cbc  Contact information: [place] [address] [phone]        Minna Merritts, MD Follow up.   Specialty:  Cardiology Contact information: [place] [address] [phone]           Discharge Medications   Allergies as of 07/27/2018      Reactions   Amiodarone Other (See Comments)   Chest discomfort  Other Rash   oramycin      Medication List    TAKE these medications   ADVAIR DISKUS 100-50 MCG/DOSE Aepb Generic drug:  Fluticasone-Salmeterol Inhale 1 puff into the lungs every 12 (twelve) hours.   CENTRUM SILVER 50+WOMEN Tabs Take 1 tablet by mouth daily.   cholecalciferol 1000 units tablet Commonly known as:  VITAMIN D Take 5,000 Units by mouth daily.   diltiazem 30 MG tablet Commonly known as:  CARDIZEM Take 1 tablet (30 mg) by mouth every 6-8 hours as needed for breakthrough a-fib   donepezil 5 MG tablet Commonly known as:  ARICEPT TAKE 1 TABLET (5 MG TOTAL) BY MOUTH AT BEDTIME.   losartan 50 MG tablet Commonly known as:  COZAAR Take 1 tablet (50 mg total) by mouth daily.   montelukast 10 MG tablet Commonly known as:  SINGULAIR Take 10 mg by mouth every evening.   nitroGLYCERIN 0.4 MG SL tablet Commonly known as:  NITROSTAT Place 1 tablet (0.4 mg total) under the tongue every 5 (five) minutes as needed for chest pain.   pantoprazole 40 MG tablet Commonly known as:  PROTONIX Take 1 tablet (40 mg total) by mouth daily.   PROVENTIL HFA 108 (90 Base) MCG/ACT inhaler Generic drug:  albuterol Inhale 2 puffs into the lungs every 6 (six)  hours as needed.   vitamin B-12 500 MCG tablet Commonly known as:  CYANOCOBALAMIN Take 500 mcg by mouth every other day.   XARELTO 20 MG Tabs tablet Generic drug:  rivaroxaban TAKE 1 TABLET BY MOUTH EVERY DAY WITH SUPPER What changed:  See the new instructions.          Total Time in preparing paper work, data evaluation and todays exam - 84 minutes  Dustin Flock M.D on 07/27/2018 at 2:33 PM [place]  [phone]

## 2018-07-27 NOTE — Plan of Care (Signed)
  Problem: Health Behavior/Discharge Planning: Goal: Ability to manage health-related needs will improve Outcome: Progressing   Problem: Safety: Goal: Ability to remain free from injury will improve Outcome: Progressing   

## 2018-07-29 ENCOUNTER — Telehealth: Payer: Self-pay | Admitting: *Deleted

## 2018-07-29 NOTE — Telephone Encounter (Signed)
Called and spoke to patient's daughter-in-laws Leah Hayden (on [facility]) and following questions answered by Leah Hayden because patient has difficulty hearing and memory issues. Transition Care Management Follow-up Telephone Call  Date discharged?  07/27/18  How have you been since you were released from the hospital? Doing fine   Do you understand why you were in the hospital? YES   Do you understand the discharge instructions? YES   Where were you discharged to? HOME   Items Reviewed:  Medications reviewed: YES  Allergies reviewed: YES  Dietary changes reviewed: YES       Referrals reviewed: YES  Functional Questionnaire:   Activities of Daily Living (ADLs):   She states they are independent in the following: grooming, going to the bathroom, dressing ambulation, bathing and feeding States they require assistance with the following: with cooking and medications   Any transportation issues/concerns?: NO   Any patient concerns? NO   Confirmed importance and date/time of follow-up visits scheduled YES    Provider Appointment booked with Dr. [name] 08/06/18 at 11:00  Confirmed with patient if condition begins to worsen call PCP or go to the ER.  Patient was given the office number and encouraged to call back with question or concerns.  YES

## 2018-07-30 ENCOUNTER — Other Ambulatory Visit: Payer: Self-pay

## 2018-07-30 NOTE — Patient Outreach (Signed)
[facility]) Care Management  07/30/2018  Leah Hayden 04/12/1928 [id number]   EMMI-General discharge  RED ON EMMI ALERT Day # 1 Date: 07/29/18 10:31 AM Red Alert Reason: "Got discharge papers? I don't know", "Unfilled prescriptions? Yes"  Outreach attempt # 1 unsuccessful. RN CM left a voice message stating my name and contact number, requesting a return phone call.     Plan: RN CM will make outreach attempt to patient within 3-4 business days, unsuccessful outreach letter sent to patient.    Barb Merino, RN,CCM [facility] Care Management Care Management Coordinator Direct Phone: [phone] Toll Free: [phone] Fax: [phone]

## 2018-07-30 NOTE — Progress Notes (Signed)
Cardiology Office Note Date:  08/01/2018  Patient ID:  Leah Hayden, DOB [birth year], MRN [id number] PCP:  Venia Carbon, MD  Cardiologist:  Dr. Rockey Situ, MD    Chief Complaint: Hospital follow up  History of Present Illness: Leah Hayden is a 84 y.o. female with history of a NSTEMI in 11/2013 nonobstructive CAD by LHC in 11/2013, PAF on Xarelto complicated by post-termination pauses, SVT, tachy-brady syndrome, dementia, breast cancer, Meniere's disease, asthma, diverticulosis, and GERD who presents for hospital follow up after recent admission to [facility] from 12/12 to 12/14 for palpitations and weakness.   It appears she may have been diagnosed with Afib around 2012. The patient's Afib was initially managed with amiodarone and Eliquis. She was admitted to the hospital in 11/2013, and ruled in for a NSTEMI. She underwent LHC that showed nonobstructive disease. Echo at that time showed an EF of 60-65%. During that admission, she was noted to have a 7.2 second post-termination pause. She was later changed to flecainide and Xarelto. In 2018, she underwent event monitoring which did not show any significant arrhythmias.   She was admitted to [facility] in 03/2018 with diarrhea and not feeling well. Heart rates at home prior to the admission were reportedly in the 40s bpm. In the ED she was in sinus bradycardia with heart rates in the 50s bpm. While in the ED she developed Afib with RVR. She was given her typical dose of flecainide in the ED. Approximately, 35 minutes following that dose she had a 5.9 second pause followed by conversion to sinus bradycardia. Echo showed an EF of 60-65%, normal wall motion, Gr1DD, mildly dilated left atrium, RVSF normal, PASP normal, trivial pericardial effusion. It was recommended she hold flecainide and pursue outpatient cardiac monitoring which was not completed. In hospital follow up in 04/2018 it was recommended she take diltiazem 30 mg prn for heart rates > 90 bpm.   She was  admitted to the hospital on 12/12 with palpitations, weakness, and fatigue. Family reported the patient's heart rate was ranging from the 50s to 90s bpm at home prior to arrival to the ED. Reportedly, the patient's grandson (who works with the fire department) verified the patient was in Afib at that time. Upon arrival to the ED she was noted to be in NSR with a heart rate in the 60s bpm. Notes indicate the patient remained in sinus rhythm on telemetry with sinus pause of 2.55 seconds. She was noted to have a mildly elevated troponin of 0.56 without chest pain that was felt to be supply demand ischemia. TSH was normal during the admission. It was recommended the patient take diltiazem 30 mg prn heart rate > 70 bpm as her heart rate is typically in the 50s bpm. Outpatient cardiac monitoring with a 2-week Zio was advised with outpatient EP follow up.   Patient comes in accompanied by her daughter-in-law and is doing relatively well from a cardiac perspective.  Since her discharge, she denies any further tachypalpitations or symptoms consistent with A. fib.  She reports when she develops A. fib she becomes significantly weak.  She is definitively unable to tell me if she is ever had any presyncope or syncope associated with A. fib and her termination pauses.  She has not had any falls since she was last seen.  No BRBPR or melena.  Both the patient and family find it very difficult to manage her A. fib with rate control in the setting of her tachybradycardia syndrome  as they are concerned it will slow her heart rate down too much or cause another posttermination pause.  They would like to be evaluated by EP for possible PPM.  No chest pain, lower extremity swelling, shortness of breath, abdominal distention, early satiety, dizziness, presyncope, or syncope.  Past Medical History:  Diagnosis Date  . Allergic rhinitis   . Asthma   . Biceps tendon tear    a. right->s/p surgical repair winter of 2014.  .  Diverticulosis of colon   . GERD (gastroesophageal reflux disease)   . Hammer toe of right foot   . Hx of colonic polyps   . HX: breast cancer   . Meniere's disease   . NSTEMI (non-ST elevated myocardial infarction) ([place])    a. 11/2012 in setting of rapid afib;  b. 11/2012 Cath: nl EF, nonobs CAD->Med Rx;  b. 11/2012 Echo: EF 60-65%, mild LVH, mild MR/TR.  . Osteoarthritis   . PAF (paroxysmal atrial fibrillation) ([place])    a. Eliquis and amio initiated 11/2012 in setting of admission with RVR and NSTEMI;  b. Subsequently changed to flecainide and xarelto;  c. 11/2013 Echo: EF 60-65%, mild LVH, mod MR/TR.  Marland Kitchen Sinus brady-tachy syndrome ([place])    a. Post-conversion pause of 7.2 seconds during 11/2012 hospitalization @ [facility]; b. 04/2017 Event Monitor: No significant arrhythmias.    Past Surgical History:  Procedure Laterality Date  . ABDOMINAL HYSTERECTOMY    . CARDIAC CATHETERIZATION  11/2013   [facility]  . [place]   2nd to left toe  . ESOPHAGOGASTRODUODENOSCOPY  10/1999   inflammation only   . FOREIGN BODY REMOVAL Right 01/02/2013   Procedure: FOREIGN BODY REMOVAL RIGHT INDEX FINGER;  Surgeon: Cammie Sickle., MD;  Location: [facility];  Service: Orthopedics;  Laterality: Right;  . HAMMER TOE SURGERY  11/15   Dr Milinda Pointer  . MASTECTOMY  1984   left   . MASTECTOMY  1985   right  . PARTIAL HYSTERECTOMY  1966   w/ bladder tack  . ROTATOR CUFF REPAIR  10/09   right   . TOE SURGERY  2001/2002   2nd/3rd,left     Current Meds  Medication Sig  . albuterol (PROVENTIL HFA) 108 (90 BASE) MCG/ACT inhaler Inhale 2 puffs into the lungs every 6 (six) hours as needed.    . cholecalciferol (VITAMIN D) 1000 units tablet Take 5,000 Units by mouth daily.  . cyanocobalamin 500 MCG tablet Take 500 mcg by mouth every other day.   . diltiazem (CARDIZEM) 30 MG tablet Take 1 tablet (30 mg) by mouth every 6-8 hours as needed for breakthrough a-fib  . donepezil (ARICEPT) 5 MG tablet  TAKE 1 TABLET (5 MG TOTAL) BY MOUTH AT BEDTIME.  Marland Kitchen Fluticasone-Salmeterol (ADVAIR DISKUS) 100-50 MCG/DOSE AEPB Inhale 1 puff into the lungs every 12 (twelve) hours.    . montelukast (SINGULAIR) 10 MG tablet Take 10 mg by mouth every evening.   . Multiple Vitamins-Minerals (CENTRUM SILVER 50+WOMEN) TABS Take 1 tablet by mouth daily.  . nitroGLYCERIN (NITROSTAT) 0.4 MG SL tablet Place 1 tablet (0.4 mg total) under the tongue every 5 (five) minutes as needed for chest pain.  . pantoprazole (PROTONIX) 40 MG tablet Take 1 tablet (40 mg total) by mouth daily.  [name] 20 MG TABS tablet TAKE 1 TABLET BY MOUTH EVERY DAY WITH SUPPER (Patient taking differently: Take 20 mg by mouth daily with supper. )    Allergies:   Amiodarone and  Other   Social History:  The patient  reports that she has never smoked. She has never used smokeless tobacco. She reports that she does not drink alcohol or use drugs.   Family History:  The patient's family history includes Arthritis in her brother and mother; Cancer in her maternal grandfather; Colon cancer in her maternal grandfather; Diabetes in her maternal grandmother; Gout in her mother; Heart disease in her father and mother; Heart failure in her father and mother.  ROS:   Review of Systems  Constitutional: Positive for malaise/fatigue. Negative for chills, diaphoresis, fever and weight loss.  HENT: Negative for congestion.   Eyes: Negative for discharge and redness.  Respiratory: Negative for cough, hemoptysis, sputum production, shortness of breath and wheezing.   Cardiovascular: Positive for palpitations. Negative for chest pain, orthopnea, claudication, leg swelling and PND.  Gastrointestinal: Negative for abdominal pain, blood in stool, heartburn, melena, nausea and vomiting.  Genitourinary: Negative for hematuria.  Musculoskeletal: Negative for falls and myalgias.  Skin: Negative for rash.  Neurological: Positive for weakness. Negative for dizziness,  tingling, tremors, sensory change, speech change, focal weakness and loss of consciousness.  Endo/Heme/Allergies: Does not bruise/bleed easily.  Psychiatric/Behavioral: Negative for substance abuse. The patient is not nervous/anxious.   All other systems reviewed and are negative.    PHYSICAL EXAM:  VS:  BP 140/60 (BP Location: Left Arm, Patient Position: Sitting, Cuff Size: Normal)   Pulse (!) 58   Ht 5\' 4"  (1.626 m)   Wt 137 lb (62.1 kg)   BMI 23.52 kg/m  BMI: Body mass index is 23.52 kg/m.  Physical Exam  Constitutional: She is oriented to person, place, and time. She appears well-developed and well-nourished.  HENT:  Head: Normocephalic and atraumatic.  Eyes: Right eye exhibits no discharge. Left eye exhibits no discharge.  Neck: Normal range of motion. No JVD present.  Cardiovascular: Regular rhythm, S1 normal, S2 normal and normal heart sounds. Bradycardia present. Exam reveals no distant heart sounds, no friction rub, no midsystolic click and no opening snap.  No murmur heard. Pulses:      Posterior tibial pulses are 2+ on the right side and 2+ on the left side.  Pulmonary/Chest: Effort normal and breath sounds normal. No respiratory distress. She has no decreased breath sounds. She has no wheezes. She has no rales. She exhibits no tenderness.  Abdominal: Soft. She exhibits no distension. There is no abdominal tenderness.  Musculoskeletal:        General: No edema.  Neurological: She is alert and oriented to person, place, and time.  Skin: Skin is warm and dry. No cyanosis. Nails show no clubbing.  Psychiatric: She has a normal mood and affect. Her speech is normal and behavior is normal. Judgment and thought content normal.     EKG:  Was ordered and interpreted by me today. Shows sinus bradycardia, 58 bpm, left axis deviation, baseline wandering, nonspecific st/t changes   Recent Labs: 07/01/2018: ALT 19 07/25/2018: Magnesium 1.9; TSH 0.851 07/26/2018: BUN 28;  Creatinine, Ser 0.95; Potassium 4.2; Sodium 140 07/27/2018: Hemoglobin 11.8; Platelets 173  07/25/2018: Cholesterol 154; HDL 54; LDL Cholesterol 92; Total CHOL/HDL Ratio 2.9; Triglycerides 41; VLDL 8   Estimated Creatinine Clearance: 35.3 mL/min (by C-G formula based on SCr of 0.95 mg/dL).   Wt Readings from Last 3 Encounters:  08/01/18 137 lb (62.1 kg)  07/27/18 132 lb 8 oz (60.1 kg)  07/01/18 138 lb (62.6 kg)     Other studies reviewed: Additional studies/records reviewed  today include: summarized above  ASSESSMENT AND PLAN:  1. Tachy-brady syndrome/post-termination pauses/PAF: Currently maintaining sinus rhythm with a bradycardic rate.  Patient has documented history of posttermination pauses as outlined above.  Her ventricular rates have been somewhat difficult to manage when she is in A. fib given her baseline bradycardic sinus rate.  In this setting, patient and family prefer to avoid further cardiac monitoring and proceed with EP evaluation for possible PPM implantation for tachybradycardia syndrome and posttermination pauses.  She has previously failed maintenance of sinus rhythm with antiarrhythmic therapy.  She remains on Xarelto.  Given her creatinine clearance is less than 50 mL/min as outlined above I recommend she decrease Xarelto to 15 mg daily.  She will take short acting diltiazem 30 mg as needed heart rate greater than 110 bpm sustained.  Referral to EP has been made.  2. NSTEMI with nonobstructive CAD: No symptoms concerning for angina.  Recent mildly elevated troponin was felt to be in the setting of supply demand ischemia.  Continue current medical therapy with Xarelto in place of aspirin.  Not currently on beta-blocker given tachybradycardia syndrome and underlying sinus bradycardia.  3. Dementia: Appears relatively stable.  Disposition: F/u with Dr. Rockey Situ or an APP in 3 months. Referral to EP as above for tachy-brady syndrome and post-termination pauses.   Current  medicines are reviewed at length with the patient today.  The patient did not have any concerns regarding medicines.  Signed, Christell Faith, PA-C 08/01/2018 1:52 PM     [address] [place], [place] [address] [phone]

## 2018-08-01 ENCOUNTER — Other Ambulatory Visit: Payer: Self-pay

## 2018-08-01 ENCOUNTER — Encounter: Payer: Self-pay | Admitting: Physician Assistant

## 2018-08-01 ENCOUNTER — Ambulatory Visit: Payer: Self-pay

## 2018-08-01 ENCOUNTER — Ambulatory Visit (INDEPENDENT_AMBULATORY_CARE_PROVIDER_SITE_OTHER): Payer: PPO | Admitting: Physician Assistant

## 2018-08-01 VITALS — BP 140/60 | HR 58 | Ht 64.0 in | Wt 137.0 lb

## 2018-08-01 DIAGNOSIS — I480 Paroxysmal atrial fibrillation: Secondary | ICD-10-CM | POA: Diagnosis not present

## 2018-08-01 DIAGNOSIS — I455 Other specified heart block: Secondary | ICD-10-CM

## 2018-08-01 DIAGNOSIS — I495 Sick sinus syndrome: Secondary | ICD-10-CM

## 2018-08-01 DIAGNOSIS — F0390 Unspecified dementia without behavioral disturbance: Secondary | ICD-10-CM | POA: Diagnosis not present

## 2018-08-01 DIAGNOSIS — I2510 Atherosclerotic heart disease of native coronary artery without angina pectoris: Secondary | ICD-10-CM

## 2018-08-01 MED ORDER — PANTOPRAZOLE SODIUM 40 MG PO TBEC: 40.0000 mg | DELAYED_RELEASE_TABLET | Freq: Every day | ORAL | 3 refills | Status: DC

## 2018-08-01 MED ORDER — RIVAROXABAN 15 MG PO TABS: 15.0000 mg | ORAL_TABLET | Freq: Every day | ORAL | 2 refills | Status: DC

## 2018-08-01 NOTE — Addendum Note (Signed)
Addended by: Vanessa Ralphs on: 08/01/2018 05:11 PM   Modules accepted: Orders

## 2018-08-01 NOTE — Progress Notes (Addendum)
Spoke with patient's, daughter, Gene More, ok per DPR. She verbalized understanding to decrease Xarelto to 15 mg by mouth once a day. Unfortunate, she had picked up the 20 mg refill this morning before appointment as patient was running low. She will call the pharmacy to see if they can help with returning it and help with the exchange as it cost the patient $118. She was understanding. Rx sent to pharmacy.

## 2018-08-01 NOTE — Patient Outreach (Signed)
[facility]) Care Management  08/01/2018  Leah Hayden Jun 21, 1928 [id number]   EMMI-#2 ATTEMPT-General discharge  RED ON EMMI ALERT Day # 1 Date: 07/29/18 10:31 am Red Alert Reason: "Got discharge papers? I don't know", "Unfilled prescriptions? Yes"  Outreach attempt # 2 unsuccessful. Spoke with patient, HIPAA verified. Reviewed and addressed red alerts. Patient requested I call her daughter in law who helps care for her. Confirmed daughter in law is Leah Hayden, phone # [phone]. Patient states this is her cell number and is the best number to reach her. She denies having any questions or concerns at this time.   Call placed to daughter in law Leah Hayden [phone], left a HIPAA approved voice message stating my name and contact number, requesting a return phone call.     Plan: RN CM will make outreach attempt to patient/daughter in law, within 3-4 business days.   Barb Merino, RN,CCM [facility] Care Management Care Management Coordinator Direct Phone: [phone] Toll Free: [phone] Fax: [phone]

## 2018-08-01 NOTE — Patient Instructions (Signed)
Medication Instructions:  Your physician recommends that you continue on your current medications as directed. Please refer to the Current Medication list given to you today.  If you need a refill on your cardiac medications before your next appointment, please call your pharmacy.   Lab work: None today   If you have labs (blood work) drawn today and your tests are completely normal, you will receive your results only by: Marland Kitchen MyChart Message (if you have MyChart) OR . A paper copy in the mail If you have any lab test that is abnormal or we need to change your treatment, we will call you to review the results.  Testing/Procedures: None today   Follow-Up: At [facility], you and your health needs are our priority.  As part of our continuing mission to provide you with exceptional heart care, we have created designated Provider Care Teams.  These Care Teams include your primary Cardiologist (physician) and Advanced Practice Providers (APPs -  Physician Assistants and Nurse Practitioners) who all work together to provide you with the care you need, when you need it. You will need a follow up appointment in 3 months. You may see Ida Rogue, MD or one of the following Advanced Practice Providers on your designated Care Team:   Murray Hodgkins, NP Christell Faith, PA-C . Marrianne Mood, PA-C  Any Other Special Instructions Will Be Listed Below (If Applicable). 1- Referral to Electrophysiology with Dr. Caryl Comes, [facility]

## 2018-08-01 NOTE — Progress Notes (Signed)
Patient's creatinine clearance is less than 50 mL/min.  Please change her from Xarelto 20 mg daily to Xarelto 15 mg daily.

## 2018-08-02 ENCOUNTER — Other Ambulatory Visit: Payer: Self-pay

## 2018-08-02 ENCOUNTER — Telehealth: Payer: Self-pay | Admitting: Cardiovascular Disease

## 2018-08-02 MED ORDER — RIVAROXABAN 15 MG PO TABS: 15.0000 mg | ORAL_TABLET | Freq: Every day | ORAL | 3 refills | Status: DC

## 2018-08-02 NOTE — Patient Outreach (Signed)
[facility]) Care Management  08/02/2018  Leah Hayden 04/18/1928 [id number]    EMMI-General discharge  Voice message received from patient's daughter in law [name], states she is returning the call and requested a call back.    Plan: RN CM will outreach to patient's daughter in law for EMMI-GENERAL DISCHARGE follow-up.    Barb Merino, RN,CCM [facility] Care Management Care Management Coordinator Direct Phone: [phone] Toll Free: [phone] Fax: [phone]

## 2018-08-02 NOTE — Telephone Encounter (Signed)
°*  STAT* If patient is at the pharmacy, call can be transferred to refill team.   1. Which medications need to be refilled? (please list name of each medication and dose if known) xarelto 15 mg po q d   2. Which pharmacy/location (including street and city if local pharmacy) is medication to be sent to? [facility] [address]   3. Do they need a 30 day or 90 day supply? [place]

## 2018-08-02 NOTE — Telephone Encounter (Signed)
Refill sent for xarelto 15 mg take one tablet daily.  Samples available for Xarelto 15 mg gave 3 bottles 7 tablets each Lot# [id number] Exp. 8/21

## 2018-08-02 NOTE — Patient Outreach (Signed)
[facility]) Care Management  08/02/2018  Leah Hayden 15-Jan-1928 [id number]     EMMI-General discharge #3 ATTEMPT  RED ON EMMI ALERT Day # 1 Date: 07/29/18 10:31 AM Red Alert Reason:  "Got discharge papers? I don't know", "Unfilled prescriptions? Yes"   Outreach attempt # 3 successful. Spoke with daughter-in-law Leah Hayden [phone], listed as approved person, HIPAA verified. Reviewed and addressed red alerts. Leah Hayden states her mother in law most likely couldn't understand what the EMMI call was about. She and other family member's are caring for the patient and managing her medications, transportation, meals, bills, shopping, etc. Patient f/u with her Cardiologist today, she will f/u with her PCP on 08/06/18. Leah Hayden states the patient "is doing very well". She has all of her medications and Leah Hayden denies having questions or financial hardship with paying for meds. She states the patient may require a Pacemaker to manage her heart condition at some point in the future. She denies having any questions or concerns at this time. She knows who to call for change in condition if needed. RN CM informed no further EMMI calls should be received re: this hospitalization. She is appreciative.    Plan: RN CM will close case due to patient is stable and no CM needs are identified at this time.       Leah Merino, RN,CCM [facility] Care Management Care Management Coordinator Direct Phone: [phone] Toll Free: [phone] Fax: [phone]

## 2018-08-05 ENCOUNTER — Ambulatory Visit: Payer: Self-pay

## 2018-08-06 ENCOUNTER — Encounter: Payer: Self-pay | Admitting: Internal Medicine

## 2018-08-06 ENCOUNTER — Ambulatory Visit (INDEPENDENT_AMBULATORY_CARE_PROVIDER_SITE_OTHER): Payer: PPO | Admitting: Internal Medicine

## 2018-08-06 VITALS — BP 138/76 | HR 60 | Temp 98.1°F | Ht 64.0 in | Wt 141.0 lb

## 2018-08-06 DIAGNOSIS — I480 Paroxysmal atrial fibrillation: Secondary | ICD-10-CM

## 2018-08-06 DIAGNOSIS — I495 Sick sinus syndrome: Secondary | ICD-10-CM | POA: Diagnosis not present

## 2018-08-06 DIAGNOSIS — F0150 Vascular dementia without behavioral disturbance: Secondary | ICD-10-CM

## 2018-08-06 NOTE — Assessment & Plan Note (Signed)
Her lack of memory and insight make the history gathering more difficult Functionally stable at this point though

## 2018-08-06 NOTE — Progress Notes (Signed)
Subjective:    Patient ID: Leah Hayden, female    DOB: [birth year], 84 y.o.   MRN: [id number]  HPI Here for hospital follow up With son  Reviewed hospital records and discharge summary Reviewed cardiology follow up visit as well  She doesn't remember much of what happened Son notes that she wasn't feeling well Grandson EMT checked her--- seemed to be in atrial fib  By the time she got to hospital---was in sinus Felt better relatively quickly No syncope or falls--but feels a bit "woozy" when she walks No palpitations No chest pain Mild edema  Still has aides 8[place] to [place] do [place] her--she has lifeline Is still independent with ADLs---including showering  Current Outpatient Medications on File Prior to Visit  Medication Sig Dispense Refill  . albuterol (PROVENTIL HFA) 108 (90 BASE) MCG/ACT inhaler Inhale 2 puffs into the lungs every 6 (six) hours as needed.      . cholecalciferol (VITAMIN D) 1000 units tablet Take 5,000 Units by mouth daily.    . cyanocobalamin 500 MCG tablet Take 500 mcg by mouth every other day.     . diltiazem (CARDIZEM) 30 MG tablet Take 1 tablet (30 mg) by mouth every 6-8 hours as needed for breakthrough a-fib 30 tablet 1  . donepezil (ARICEPT) 5 MG tablet TAKE 1 TABLET (5 MG TOTAL) BY MOUTH AT BEDTIME. 90 tablet 3  . Fluticasone-Salmeterol (ADVAIR DISKUS) 100-50 MCG/DOSE AEPB Inhale 1 puff into the lungs every 12 (twelve) hours.      . montelukast (SINGULAIR) 10 MG tablet Take 10 mg by mouth every evening.     . Multiple Vitamins-Minerals (CENTRUM SILVER 50+WOMEN) TABS Take 1 tablet by mouth daily.    . nitroGLYCERIN (NITROSTAT) 0.4 MG SL tablet Place 1 tablet (0.4 mg total) under the tongue every 5 (five) minutes as needed for chest pain. 25 tablet 1  . pantoprazole (PROTONIX) 40 MG tablet Take 1 tablet (40 mg total) by mouth daily. 90 tablet 3  . Rivaroxaban (XARELTO) 15 MG TABS tablet Take 1 tablet (15 mg total) by mouth daily with supper. 90  tablet 3  . losartan (COZAAR) 50 MG tablet Take 1 tablet (50 mg total) by mouth daily. 30 tablet 5   No current facility-administered medications on file prior to visit.     Allergies  Allergen Reactions  . Amiodarone Other (See Comments)    Chest discomfort  . Other Rash    oramycin    Past Medical History:  Diagnosis Date  . Allergic rhinitis   . Asthma   . Biceps tendon tear    a. right->s/p surgical repair winter of 2014.  . Diverticulosis of colon   . GERD (gastroesophageal reflux disease)   . Hammer toe of right foot   . Hx of colonic polyps   . HX: breast cancer   . Meniere's disease   . NSTEMI (non-ST elevated myocardial infarction) ([place])    a. 11/2012 in setting of rapid afib;  b. 11/2012 Cath: nl EF, nonobs CAD->Med Rx;  b. 11/2012 Echo: EF 60-65%, mild LVH, mild MR/TR.  . Osteoarthritis   . PAF (paroxysmal atrial fibrillation) ([place])    a. Eliquis and amio initiated 11/2012 in setting of admission with RVR and NSTEMI;  b. Subsequently changed to flecainide and xarelto;  c. 11/2013 Echo: EF 60-65%, mild LVH, mod MR/TR.  Marland Kitchen Sinus brady-tachy syndrome ([place])    a. Post-conversion pause of 7.2 seconds during 11/2012 hospitalization @ [facility]; b. 04/2017  Event Monitor: No significant arrhythmias.    Past Surgical History:  Procedure Laterality Date  . ABDOMINAL HYSTERECTOMY    . CARDIAC CATHETERIZATION  11/2013   [facility]  . [place]   2nd to left toe  . ESOPHAGOGASTRODUODENOSCOPY  10/1999   inflammation only   . FOREIGN BODY REMOVAL Right 01/02/2013   Procedure: FOREIGN BODY REMOVAL RIGHT INDEX FINGER;  Surgeon: Cammie Sickle., MD;  Location: [place];  Service: Orthopedics;  Laterality: Right;  . HAMMER TOE SURGERY  11/15   Dr Milinda Pointer  . MASTECTOMY  1984   left   . MASTECTOMY  1985   right  . PARTIAL HYSTERECTOMY  1966   w/ bladder tack  . ROTATOR CUFF REPAIR  10/09   right   . TOE SURGERY  2001/2002   2nd/3rd,left     Family  History  Problem Relation Age of Onset  . Heart failure Mother   . Gout Mother   . Heart disease Mother        heart failure  . Arthritis Mother   . Heart failure Father   . Heart disease Father        heart failure  . Diabetes Maternal Grandmother   . Colon cancer Maternal Grandfather   . Cancer Maternal Grandfather        colon  . Arthritis Brother     Social History   Socioeconomic History  . Marital status: Widowed    Spouse name: Not on file  . Number of children: 2  . Years of education: Not on file  . Highest education level: Not on file  Occupational History  . 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 Financial resource strain: Not on file  . Food insecurity:    Worry: Not on file    Inability: Not on file  . Transportation needs:    Medical: Not on file    Non-medical: Not on file  Tobacco Use  . Smoking status: Never Smoker  . Smokeless tobacco: Never Used  Substance and Sexual Activity  . Alcohol use: No    Alcohol/week: 0.0 standard drinks  . Drug use: No  . Sexual activity: Not on file  Lifestyle  . Physical activity:    Days per week: Not on file    Minutes per session: Not on file  . Stress: Not on file  Relationships  . Social connections:    Talks on phone: Not on file    Gets together: Not on file    Attends religious service: Not on file    Active member of club or organization: Not on file    Attends meetings of clubs or organizations: Not on file    Relationship status: Not on file  . Intimate partner violence:    Fear of current or ex partner: Not on file    Emotionally abused: Not on file    Physically abused: Not on file    Forced sexual activity: Not on file  Other Topics Concern  . Not on file  Social History Narrative   Now has living will   Sons are health care POAs   Has DNR   No tube feeds if cognitively unaware   Review of Systems Some leg cramps--affecting her sleeps  some Usually does sleep okay Appetite is okay Weight is up slightly    Objective:   Physical Exam  Constitutional: No distress.  Cardiovascular: Normal rate and regular rhythm. Exam reveals no gallop.  Gr 2/6 systolic murmur  Respiratory: Effort normal and breath sounds normal. No respiratory distress. She has no wheezes. She has no rales.  Musculoskeletal:        General: No edema.  Psychiatric: She has a normal mood and affect. Her behavior is normal.           Assessment & Plan:

## 2018-08-06 NOTE — Assessment & Plan Note (Signed)
Seems to have had rapid spell precipitating her feeling bad--then converted with pause and bradycardia at hospital Hard to treat paroxysms due to concern for the bradycardia Still on the xarelto--reduced dose

## 2018-08-06 NOTE — Assessment & Plan Note (Signed)
Seems to have sinus insufficiency in view of these attacks Appointment pending to consider pacemaker (which seems appropriate). To ER if severe dizziness or bradycardia noted

## 2018-08-08 ENCOUNTER — Other Ambulatory Visit: Payer: Self-pay | Admitting: Pharmacist

## 2018-08-08 NOTE — Patient Outreach (Signed)
[facility]) Care Management  08/08/2018  Leah Hayden September 26, 1927 [id number]  Contacted [name] Beckford, patient's daughter in law and preferred contact, for 30 day post discharge medication review. HIPAA compliant message left to return my call at their convenience.   Plan - Will attempt call #2 if I have not heard back in 2-3 business days  Catie Darnelle Maffucci, PharmD PGY2 Ambulatory Care Pharmacy Resident, [place] Phone: [phone]

## 2018-08-09 ENCOUNTER — Other Ambulatory Visit: Payer: Self-pay | Admitting: Pharmacist

## 2018-08-09 NOTE — Patient Outreach (Signed)
[facility] [facility]) Care Management  [place]   08/09/2018  Leah Hayden [birth year] [id number]  Reason for referral: Medication Reconciliation Post Discharge  Current insurance:[employer]  PMHx includes but not limited to: nonobstructive CAD hx NSTEMI 11/2013, atrial fibrillation, SVT, tachy-brady syndrome, dementia, hx breast cancer, Meniere's disease, asthma, GERD, CKD  Outreach:  Successful telephone call with patient's daughter in law (on [place]), Leah Hayden. HIPAA identifiers verified.   Subjective:  Patient was admitted at [facility] 12/12-12/14/2019 with palpitations and demand ischemia d/t atrial fibrillation. She has since had follow up with cardiology where a referral was placed for electrophysiology f/u, scheduled in 09/2018.   At recent cardiology follow up, Xarelto dosing was reduced d/t renal dysfunction. Leah Hayden confirms that Leah Hayden is now taking the appropriate 15 mg daily dose. She notes that patient has not needed the PRN diltiazem prescription for runs of afib since discharge. Leah Hayden denies any questions or concerns regarding patient's medications or medical conditions at this time. Leah Hayden fills a pill box for the patient once a week, and helps to provide reminders for her to take her medications.   She notes that she is taking both Advair and albuterol PRN, but has not needed either inhaler recently.   Objective: Lab Results  Component Value Date   CREATININE 0.95 07/26/2018   CREATININE 0.97 07/25/2018   CREATININE 0.97 07/01/2018  CrCl ~ 35 mL/min  Lab Results  Component Value Date   HGBA1C 5.9 11/28/2013    Lipid Panel     Component Value Date/Time   CHOL 154 07/25/2018 1510   CHOL 122 01/16/2014 0843   TRIG 41 07/25/2018 1510   HDL 54 07/25/2018 1510   HDL 70 01/16/2014 0843   CHOLHDL 2.9 07/25/2018 1510   VLDL 8 07/25/2018 1510   LDLCALC 92 07/25/2018 1510   LDLCALC 38 01/16/2014 0843    BP Readings from Last 3 Encounters:   08/06/18 138/76  08/01/18 140/60  07/27/18 (!) 148/67    Allergies  Allergen Reactions  . Amiodarone Other (See Comments)    Chest discomfort  . Other Rash    oramycin    Medications Reviewed Today    Reviewed by De Hollingshead, [facility] (Pharmacist) on 08/09/18 at 1157  Med List Status: <None>  Medication Order Taking? Sig Documenting Provider Last Dose Status Informant  albuterol (PROVENTIL HFA) 108 (90 BASE) MCG/ACT inhaler [id number] No Inhale 2 puffs into the lungs every 6 (six) hours as needed.   [provider] Not Taking Active Pharmacy Records  cholecalciferol (VITAMIN D) 1000 units tablet [id number] Yes Take 5,000 Units by mouth daily. [provider] Taking Active Pharmacy Records  cyanocobalamin 500 MCG tablet [id number] Yes Take 500 mcg by mouth every other day.  [provider] Taking Active Pharmacy Records  diltiazem (CARDIZEM) 30 MG tablet [id number] No Take 1 tablet (30 mg) by mouth every 6-8 hours as needed for breakthrough a-fib  Patient not taking:  Reported on 08/09/2018   Minna Merritts, MD Not Taking Active Pharmacy Records  donepezil (ARICEPT) 5 MG tablet [id number] Yes TAKE 1 TABLET (5 MG TOTAL) BY MOUTH AT BEDTIME. Venia Carbon, MD Taking Active Pharmacy Records  Fluticasone-Salmeterol (ADVAIR DISKUS) 100-50 MCG/DOSE AEPB [id number] No Inhale 1 puff into the lungs every 12 (twelve) hours.   [provider] Not Taking Active Pharmacy Records  losartan (COZAAR) 50 MG tablet [id number] Yes Take 1 tablet (50 mg total) by mouth daily. Minna Merritts, MD Taking  Active Pharmacy Records  montelukast (SINGULAIR) 10 MG tablet [id number] Yes Take 10 mg by mouth every evening.  [provider] Taking Active Pharmacy Records  Multiple Vitamins-Minerals (CENTRUM SILVER 50+WOMEN) TABS [id number] Yes Take 1 tablet by mouth daily. [provider] Taking Active Pharmacy Records  nitroGLYCERIN (NITROSTAT) 0.4 MG SL tablet  [id number] No Place 1 tablet (0.4 mg total) under the tongue every 5 (five) minutes as needed for chest pain.  Patient not taking:  Reported on 08/09/2018   Minna Merritts, MD Not Taking Active Pharmacy Records  pantoprazole (PROTONIX) 40 MG tablet [id number] Yes Take 1 tablet (40 mg total) by mouth daily. Rise Mu, PA-C Taking Active   Rivaroxaban (XARELTO) 15 MG TABS tablet [id number] Yes Take 1 tablet (15 mg total) by mouth daily with supper. Minna Merritts, MD Taking Active           Assessment: Date Discharged from Hospital: 07/27/2018 Date Medication Reconciliation Performed: 08/09/2018  Medications:  New at Discharge: . Pantoprazole  Patient was recently discharged from hospital and all medications have been reviewed.  Drugs sorted by system:  Neurologic/Psychologic: donepezil  Cardiovascular: diltiazem PRN, losartan, Xarelto, nitroglycerin PRN  Pulmonary/Allergy: albuterol, Advair (PRN), montelukast  Gastrointestinal: pantoprazole  Vitamins/Minerals/Supplements: Vitamin D, Vitamin B12, multivitamin  Medication Review Findings:  . Chronic kidney disease: all medications are appropriately dosed given the patient's renal dysfunction . Statin use in patients with ASCVD: patient was previously on atorvastatin, but it was discontinued in 2016 as the patient was trying to determine if any medications were causing a rash. Statin therapy was never resumed; however, given patient's age, it may be appropriate to avoid reinitiation, particularly given fairly well controlled LDL.  Marland Kitchen Medication Access: Leah Hayden denies any concerns affording or obtaining medications at this time   Plan - Will route note to PCP for Juluis Rainier - Leah Hayden has my contact information for any future questions or concerns.  Catie Darnelle Maffucci, PharmD PGY2 Ambulatory Care Pharmacy Resident, [facility] Phone: [phone]

## 2018-08-09 NOTE — Patient Outreach (Signed)
[place] [facility]) Care Management  08/09/2018  Leah Hayden 12/26/1927 [id number]  Received call back from patient's daughter in law, Leah Hayden, after hours yesterday. Returned call and left HIPAA compliant message.   Catie Darnelle Maffucci, PharmD PGY2 Ambulatory Care Pharmacy Resident, [facility] Phone: [phone]

## 2018-08-13 ENCOUNTER — Ambulatory Visit: Payer: PPO | Admitting: Podiatry

## 2018-08-17 ENCOUNTER — Other Ambulatory Visit: Payer: Self-pay | Admitting: Cardiology

## 2018-08-17 MED ORDER — LOSARTAN POTASSIUM 25 MG PO TABS: 50.0000 mg | ORAL_TABLET | Freq: Every day | ORAL | 5 refills | Status: DC

## 2018-08-17 NOTE — Progress Notes (Signed)
Losartan 50 mg is backordered. Will send in losartan 25 mg - take 2 tablets.

## 2018-08-23 ENCOUNTER — Encounter: Payer: Self-pay | Admitting: Podiatry

## 2018-08-23 ENCOUNTER — Ambulatory Visit: Payer: PPO | Admitting: Podiatry

## 2018-08-23 ENCOUNTER — Other Ambulatory Visit: Payer: Self-pay | Admitting: Cardiovascular Disease

## 2018-08-23 DIAGNOSIS — M79676 Pain in unspecified toe(s): Secondary | ICD-10-CM

## 2018-08-23 DIAGNOSIS — B351 Tinea unguium: Secondary | ICD-10-CM

## 2018-08-23 NOTE — Telephone Encounter (Signed)
Please review for refill.  

## 2018-08-26 NOTE — Progress Notes (Signed)
   SUBJECTIVE Patient presents to office today complaining of elongated, thickened nails that cause pain while ambulating in shoes. She is unable to trim her own nails. Patient is here for further evaluation and treatment.  Past Medical History:  Diagnosis Date  . Allergic rhinitis   . Asthma   . Biceps tendon tear    a. right->s/p surgical repair winter of 2014.  . Diverticulosis of colon   . GERD (gastroesophageal reflux disease)   . Hammer toe of right foot   . Hx of colonic polyps   . HX: breast cancer   . Meniere's disease   . NSTEMI (non-ST elevated myocardial infarction) ([place])    a. 11/2012 in setting of rapid afib;  b. 11/2012 Cath: nl EF, nonobs CAD->Med Rx;  b. 11/2012 Echo: EF 60-65%, mild LVH, mild MR/TR.  . Osteoarthritis   . PAF (paroxysmal atrial fibrillation) ([place])    a. Eliquis and amio initiated 11/2012 in setting of admission with RVR and NSTEMI;  b. Subsequently changed to flecainide and xarelto;  c. 11/2013 Echo: EF 60-65%, mild LVH, mod MR/TR.  Marland Kitchen Sinus brady-tachy syndrome ([place])    a. Post-conversion pause of 7.2 seconds during 11/2012 hospitalization @ [facility]; b. 04/2017 Event Monitor: No significant arrhythmias.    OBJECTIVE General Patient is awake, alert, and oriented x 3 and in no acute distress. Derm Skin is dry and supple bilateral. Negative open lesions or macerations. Remaining integument unremarkable. Nails are tender, long, thickened and dystrophic with subungual debris, consistent with onychomycosis, 1-5 bilateral. No signs of infection noted. Vasc  DP and PT pedal pulses palpable bilaterally. Temperature gradient within normal limits.  Neuro Epicritic and protective threshold sensation grossly intact bilaterally.  Musculoskeletal Exam No symptomatic pedal deformities noted bilateral. Muscular strength within normal limits.  ASSESSMENT 1. Onychodystrophic nails 1-5 bilateral with hyperkeratosis of nails.  2. Onychomycosis of nail due to dermatophyte  bilateral 3. Pain in foot bilateral  PLAN OF CARE 1. Patient evaluated today.  2. Instructed to maintain good pedal hygiene and foot care.  3. Mechanical debridement of nails 1-5 bilaterally performed using a nail nipper. Filed with dremel without incident.  4. Return to clinic in 3 mos.    Edrick Kins, DPM [facility]  Dr. Edrick Kins, [place]                                        [place], [place] [address]                Office [phone]  Fax [phone]

## 2018-09-06 ENCOUNTER — Encounter: Payer: Self-pay | Admitting: Cardiovascular Disease

## 2018-09-06 NOTE — Progress Notes (Signed)
I went through the sample box at the front desk. The patient has not picked up her Xarelto 15 mg tablets that have been there for her since 08/02/18.  Returning samples to the sample closet:  Xarelto 15 mg [id number] Exp: 8/21 # 3 bottles returned

## 2018-09-17 DIAGNOSIS — H353131 Nonexudative age-related macular degeneration, bilateral, early dry stage: Secondary | ICD-10-CM | POA: Diagnosis not present

## 2018-09-19 ENCOUNTER — Encounter: Payer: Self-pay | Admitting: Internal Medicine

## 2018-09-19 ENCOUNTER — Encounter: Payer: Self-pay | Admitting: *Deleted

## 2018-09-19 ENCOUNTER — Ambulatory Visit (INDEPENDENT_AMBULATORY_CARE_PROVIDER_SITE_OTHER): Payer: PPO | Admitting: Internal Medicine

## 2018-09-19 VITALS — BP 126/60 | HR 77 | Ht 64.0 in | Wt 137.8 lb

## 2018-09-19 DIAGNOSIS — I495 Sick sinus syndrome: Secondary | ICD-10-CM | POA: Diagnosis not present

## 2018-09-19 DIAGNOSIS — Z01812 Encounter for preprocedural laboratory examination: Secondary | ICD-10-CM | POA: Diagnosis not present

## 2018-09-19 DIAGNOSIS — I480 Paroxysmal atrial fibrillation: Secondary | ICD-10-CM

## 2018-09-19 NOTE — Patient Instructions (Signed)
Medication Instructions:  - Your physician recommends that you continue on your current medications as directed. Please refer to the Current Medication list given to you today.  If you need a refill on your cardiac medications before your next appointment, please call your pharmacy.   Lab work: - Your physician recommends that you have lab work today: BMP/ CBC  If you have labs (blood work) drawn today and your tests are completely normal, you will receive your results only by: Marland Kitchen MyChart Message (if you have MyChart) OR . A paper copy in the mail If you have any lab test that is abnormal or we need to change your treatment, we will call you to review the results.  Testing/Procedures: - Your physician has recommended that you have a pacemaker inserted. A pacemaker is a small device that is placed under the skin of your chest or abdomen to help control abnormal heart rhythms. This device uses electrical pulses to prompt the heart to beat at a normal rate. Pacemakers are used to treat heart rhythms that are too slow. Wire (leads) are attached to the pacemaker that goes into the chambers of you heart. This is done in the hospital and usually requires and overnight stay. Please see the instruction sheet given to you today for more information.  Follow-Up: At [facility], you and your health needs are our priority.  As part of our continuing mission to provide you with exceptional heart care, we have created designated Provider Care Teams.  These Care Teams include your primary Cardiologist (physician) and Advanced Practice Providers (APPs -  Physician Assistants and Nurse Practitioners) who all work together to provide you with the care you need, when you need it. . 10-14 days (from 09/20/2018) for a wound check appointment  [facility])- scheduling will call you to arrange  . In 91 days (from 09/20/2018) with Dr. Caryl Comes [facility])  Any Other Special Instructions Will Be Listed Below (If  Applicable).  - Please wear an abdominal binder during your waking hours only   Pacemaker Implantation, Adult Pacemaker implantation is a procedure to place a pacemaker inside your chest. A pacemaker is a small computer that sends electrical signals to the heart and helps your heart beat normally. A pacemaker also stores information about your heart rhythms. You may need pacemaker implantation if you:  Have a slow heartbeat (bradycardia).  Faint (syncope).  Have shortness of breath (dyspnea) due to heart problems. The pacemaker attaches to your heart through a wire, called a lead. Sometimes just one lead is needed. Other times, there will be two leads. There are two types of pacemakers:  Transvenous pacemaker. This type is placed under the skin or muscle of your chest. The lead goes through a vein in the chest area to reach the inside of the heart.  Epicardial pacemaker. This type is placed under the skin or muscle of your chest or belly. The lead goes through your chest to the outside of the heart. Tell a health care provider about:  Any allergies you have.  All medicines you are taking, including vitamins, herbs, eye drops, creams, and over-the-counter medicines.  Any problems you or family members have had with anesthetic medicines.  Any blood or bone disorders you have.  Any surgeries you have had.  Any medical conditions you have.  Whether you are pregnant or may be pregnant. What are the risks? Generally, this is a safe procedure. However, problems may occur, including:  Infection.  Bleeding.  Failure of the  pacemaker or the lead.  Collapse of a lung or bleeding into a lung.  Blood clot inside a blood vessel with a lead.  Damage to the heart.  Infection inside the heart (endocarditis).  Allergic reactions to medicines. What happens before the procedure? Staying hydrated Follow instructions from your health care provider about hydration, which may  include:  Up to 2 hours before the procedure - you may continue to drink clear liquids, such as water, clear fruit juice, black coffee, and plain tea. Eating and drinking restrictions Follow instructions from your health care provider about eating and drinking, which may include:  8 hours before the procedure - stop eating heavy meals or foods such as meat, fried foods, or fatty foods.  6 hours before the procedure - stop eating light meals or foods, such as toast or cereal.  6 hours before the procedure - stop drinking milk or drinks that contain milk.  2 hours before the procedure - stop drinking clear liquids. Medicines  Ask your health care provider about: ? Changing or stopping your regular medicines. This is especially important if you are taking diabetes medicines or blood thinners. ? Taking medicines such as aspirin and ibuprofen. These medicines can thin your blood. Do not take these medicines before your procedure if your health care provider instructs you not to.  You may be given antibiotic medicine to help prevent infection. General instructions  You will have a heart evaluation. This may include an electrocardiogram (ECG), chest X-ray, and heart imaging (echocardiogram,  or echo) tests.  You will have blood tests.  Do not use any products that contain nicotine or tobacco, such as cigarettes and e-cigarettes. If you need help quitting, ask your health care provider.  Plan to have someone take you home from the hospital or clinic.  If you will be going home right after the procedure, plan to have someone with you for 24 hours.  Ask your health care provider how your surgical site will be marked or identified. What happens during the procedure?  To reduce your risk of infection: ? Your health care team will wash or sanitize their hands. ? Your skin will be washed with soap. ? Hair may be removed from the surgical area.  An IV tube will be inserted into one of your  veins.  You will be given one or more of the following: ? A medicine to help you relax (sedative). ? A medicine to numb the area (local anesthetic). ? A medicine to make you fall asleep (general anesthetic).  If you are getting a transvenous pacemaker: ? An incision will be made in your upper chest. ? A pocket will be made for the pacemaker. It may be placed under the skin or between layers of muscle. ? The lead will be inserted into a blood vessel that returns to the heart. ? While X-rays are taken by an imaging machine (fluoroscopy), the lead will be advanced through the vein to the inside of your heart. ? The other end of the lead will be tunneled under the skin and attached to the pacemaker.  If you are getting an epicardial pacemaker: ? An incision will be made near your ribs or breastbone (sternum) for the lead. ? The lead will be attached to the outside of your heart. ? Another incision will be made in your chest or upper belly to create a pocket for the pacemaker. ? The free end of the lead will be tunneled under the skin and  attached to the pacemaker.  The transvenous or epicardial pacemaker will be tested. Imaging studies may be done to check the lead position.  The incisions will be closed with stitches (sutures), adhesive strips, or skin glue.  Bandages (dressing) will be placed over the incisions. The procedure may vary among health care providers and hospitals. What happens after the procedure?  Your blood pressure, heart rate, breathing rate, and blood oxygen level will be monitored until the medicines you were given have worn off.  You will be given antibiotics and pain medicine.  ECG and chest x-rays will be done.  You will wear a continuous type of ECG (Holter monitor) to check your heart rhythm.  Your health care provider will program the pacemaker.  Do not drive for 24 hours if you received a sedative. This information is not intended to replace advice given  to you by your health care provider. Make sure you discuss any questions you have with your health care provider. Document Released: 07/21/2002 Document Revised: 04/19/2018 Document Reviewed: 01/12/2016 Elsevier Interactive Patient Education  2019 [employer].

## 2018-09-19 NOTE — Progress Notes (Signed)
ELECTROPHYSIOLOGY CONSULT NOTE  Patient ID: Leah Hayden, MRN: [id number], DOB/AGE: May 16, 1928 85 y.o. Admit date: (Not on file) Date of Consult: 09/19/2018  Primary Physician: Venia Carbon, MD Primary Cardiologist: Evelena Leyden Vizzini is a 85 y.o. female who is being seen today for the evaluation of tachybrady at the request of TG   HPI Leah Hayden is a 85 y.o. female referred for consideration of pacing.  She has a number of rather nondescript symptoms including irritability weakness and dizziness which occurred in the context of known atrial fibrillation.  History of non-STEMI 2015 with nonobstructive disease?  Paradoxical embolism.  Atrial fibrillation diagnosed 2012.  Placed initially with amiodarone and anticoagulated with apixaban.  4/15 was noted to have a 7-second posttermination pause.  Amiodarone was discontinued and she was treated with flecainide.  Anticoagulation was changed to Xarelto.  8/19 had repeat posttermination pause documented in hospital 5.9 seconds. ECGs from that hospitalization were reviewed.  Presented with sinus in the low 50s and a few hours later was in atrial fibrillation at 150  Date Cr K Hgb  12/19 0.95 4.2 11.8           DATE TEST EF   4/15 Echo   60-65 %   4/15  [facility]   CAD nonobstructive  8/19 Echo  60-65%    Her episodic weakness and irritability is ascribed to atrial fibrillation.  Her grandson is medically able and has taken her pulse on some of these occasions and has noted atrial fibrillation.  It is not clear how close this correlation is.  She has modest dizziness with standing and the longer she stands the more dizzy she becomes.  She has some shower intolerance.  She walks with a cane with a walker.  Her memory is not great so she is not able to tell me about palpitations shortness of breath or chest discomfort.   History comes from her daughter in law and son   Past Medical History:  Diagnosis Date  . Allergic  rhinitis   . Asthma   . Biceps tendon tear    a. right->s/p surgical repair winter of 2014.  . Diverticulosis of colon   . GERD (gastroesophageal reflux disease)   . Hammer toe of right foot   . Hx of colonic polyps   . HX: breast cancer   . Meniere's disease   . NSTEMI (non-ST elevated myocardial infarction) ([place])    a. 11/2012 in setting of rapid afib;  b. 11/2012 Cath: nl EF, nonobs CAD->Med Rx;  b. 11/2012 Echo: EF 60-65%, mild LVH, mild MR/TR.  . Osteoarthritis   . PAF (paroxysmal atrial fibrillation) ([place])    a. Eliquis and amio initiated 11/2012 in setting of admission with RVR and NSTEMI;  b. Subsequently changed to flecainide and xarelto;  c. 11/2013 Echo: EF 60-65%, mild LVH, mod MR/TR.  Marland Kitchen Sinus brady-tachy syndrome ([place])    a. Post-conversion pause of 7.2 seconds during 11/2012 hospitalization @ [facility]; b. 04/2017 Event Monitor: No significant arrhythmias.      Surgical History:  Past Surgical History:  Procedure Laterality Date  . ABDOMINAL HYSTERECTOMY    . CARDIAC CATHETERIZATION  11/2013   [facility]  . [place]   2nd to left toe  . ESOPHAGOGASTRODUODENOSCOPY  10/1999   inflammation only   . FOREIGN BODY REMOVAL Right 01/02/2013   Procedure: FOREIGN BODY REMOVAL RIGHT INDEX FINGER;  Surgeon: Youlanda Mighty  Luisa Dago., MD;  Location: [place];  Service: Orthopedics;  Laterality: Right;  . HAMMER TOE SURGERY  11/15   Dr Milinda Pointer  . MASTECTOMY  1984   left   . MASTECTOMY  1985   right  . PARTIAL HYSTERECTOMY  1966   w/ bladder tack  . ROTATOR CUFF REPAIR  10/09   right   . TOE SURGERY  2001/2002   2nd/3rd,left      Home Meds: Current Meds  Medication Sig  . albuterol (PROVENTIL HFA) 108 (90 BASE) MCG/ACT inhaler Inhale 2 puffs into the lungs every 6 (six) hours as needed.    . Artificial Tear Ointment (DRY EYES OP) Apply to eye as needed.  . cholecalciferol (VITAMIN D) 1000 units tablet Take 5,000 Units by mouth daily.  . cyanocobalamin 500 MCG  tablet Take 500 mcg by mouth every other day.   . diltiazem (CARDIZEM) 30 MG tablet Take 1 tablet (30 mg) by mouth every 6-8 hours as needed for breakthrough a-fib  . donepezil (ARICEPT) 5 MG tablet TAKE 1 TABLET (5 MG TOTAL) BY MOUTH AT BEDTIME.  Marland Kitchen Fluticasone-Salmeterol (ADVAIR DISKUS) 100-50 MCG/DOSE AEPB Inhale 1 puff into the lungs every 12 (twelve) hours.    Marland Kitchen losartan (COZAAR) 25 MG tablet Take 2 tablets (50 mg total) by mouth daily.  . montelukast (SINGULAIR) 10 MG tablet Take 10 mg by mouth every evening.   . Multiple Vitamins-Minerals (CENTRUM SILVER 50+WOMEN) TABS Take 1 tablet by mouth daily.  . nitroGLYCERIN (NITROSTAT) 0.4 MG SL tablet Place 1 tablet (0.4 mg total) under the tongue every 5 (five) minutes as needed for chest pain.  . pantoprazole (PROTONIX) 40 MG tablet Take 1 tablet (40 mg total) by mouth daily.  . Rivaroxaban (XARELTO) 15 MG TABS tablet Take 1 tablet (15 mg total) by mouth daily with supper.    Allergies:  Allergies  Allergen Reactions  . Amiodarone Other (See Comments)    Chest discomfort  . Other Rash    oramycin    Social History   Socioeconomic History  . Marital status: Widowed    Spouse name: Not on file  . Number of children: 2  . Years of education: Not on file  . Highest education level: Not on file  Occupational History  . 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 Financial resource strain: Not on file  . Food insecurity:    Worry: Not on file    Inability: Not on file  . Transportation needs:    Medical: Not on file    Non-medical: Not on file  Tobacco Use  . Smoking status: Never Smoker  . Smokeless tobacco: Never Used  Substance and Sexual Activity  . Alcohol use: No    Alcohol/week: 0.0 standard drinks  . Drug use: No  . Sexual activity: Not on file  Lifestyle  . Physical activity:    Days per week: Not on file    Minutes per session: Not on file  . Stress: Not on file    Relationships  . Social connections:    Talks on phone: Not on file    Gets together: Not on file    Attends religious service: Not on file    Active member of club or organization: Not on file    Attends meetings of clubs or organizations: Not on file    Relationship status: Not on file  . Intimate partner violence:  Fear of current or ex partner: Not on file    Emotionally abused: Not on file    Physically abused: Not on file    Forced sexual activity: Not on file  Other Topics Concern  . Not on file  Social History Narrative   Now has living will   Sons are health care POAs   Has DNR   No tube feeds if cognitively unaware     Family History  Problem Relation Age of Onset  . Heart failure Mother   . Gout Mother   . Heart disease Mother        heart failure  . Arthritis Mother   . Heart failure Father   . Heart disease Father        heart failure  . Diabetes Maternal Grandmother   . Colon cancer Maternal Grandfather   . Cancer Maternal Grandfather        colon  . Arthritis Brother      ROS:  Please see the history of present illness.     All other systems reviewed and negative.    Physical Exam:  Blood pressure 126/60, pulse 77, height 5\' 4"  (1.626 m), weight 137 lb 12 oz (62.5 kg). General: Well developed, well nourished female in no acute distress. Head: Normocephalic, atraumatic, sclera non-icteric, no xanthomas, nares are without discharge. EENT: normal  Lymph Nodes:  none Neck: Negative for carotid bruits. JVD not elevated. Back:without scoliosis kyphosis  Lungs: Clear bilaterally to auscultation without wheezes, rales, or rhonchi. Breathing is unlabored. Heart: RRR with S1 S2.  2/6 systolic murmur . No rubs, or gallops appreciated. Abdomen: Soft, non-tender, non-distended with normoactive bowel sounds. No hepatomegaly. No rebound/guarding. No obvious abdominal masses. Msk:  Strength and tone appear normal for age. Extremities: No clubbing or cyanosis.  tr edema.  Distal pedal pulses are 2+ and equal bilaterally. Skin: Warm and Dry Neuro: Alert and oriented X 2. CN III-XII intact Grossly normal sensory and motor function . Psych:  Responds to questions appropriately with a normal affect.      Labs: Cardiac Enzymes No results for input(s): CKTOTAL, CKMB, TROPONINI in the last 72 hours. CBC Lab Results  Component Value Date   WBC 4.8 07/27/2018   HGB 11.8 (L) 07/27/2018   HCT 35.8 (L) 07/27/2018   MCV 94.2 07/27/2018   PLT 173 07/27/2018   PROTIME: No results for input(s): LABPROT, INR in the last 72 hours. Chemistry No results for input(s): NA, K, CL, CO2, BUN, CREATININE, CALCIUM, PROT, BILITOT, ALKPHOS, ALT, AST, GLUCOSE in the last 168 hours.  Invalid input(s): LABALBU Lipids Lab Results  Component Value Date   CHOL 154 07/25/2018   HDL 54 07/25/2018   LDLCALC 92 07/25/2018   TRIG 41 07/25/2018   BNP No results found for: PROBNP Thyroid Function Tests: No results for input(s): TSH, T4TOTAL, T3FREE, THYROIDAB in the last 72 hours.  Invalid input(s): FREET3 Miscellaneous No results found for: DDIMER  Radiology/Studies:  No results found.  EKG: ECGs were personally reviewed as outlined above. 09/19/17  Sinus @ 77   Assessment and Plan:  Atrial fibrillation with a rapid rate  Sinus node dysfunction with posttermination pauses greater than 6-7 seconds with some resting bradycardia  Orthostatic hypotension-severe  Dementia  She has tachybradycardia syndrome and with a prolonged pauses is at risk for falling with syncope.  Hence, pacing is indicated.  In addition, she has rapid atrial fibrillation the burden of which is not at all clear.  Its management will be determined by the burden.  AV nodal blocking agents run the risk of aggravating her orthostatic hypotension.  Amiodarone might be a better choice if the burden is significant.  Her orthostasis is a challenge.  Given her age and complex medical regime, I  reviewed with the son and daughter-in-law nonpharmacological options including compressive wear i.e. abdominal binder and thigh sleeves.  They will pursue the former.  They do not think thigh sleeves will work so in the event that further intervention is necessary we may try Mestinon  The benefits and risks were reviewed including but not limited to death,  perforation, infection, lead dislodgement and device malfunction.  The patient understands agrees and is willing to proceed.    Virl Axe

## 2018-09-20 ENCOUNTER — Other Ambulatory Visit: Payer: Self-pay

## 2018-09-20 ENCOUNTER — Encounter (HOSPITAL_COMMUNITY)
Admission: RE | Disposition: A | Discharge status: Home / Self Care | Payer: Self-pay | Source: Ambulatory Visit | Attending: Internal Medicine

## 2018-09-20 ENCOUNTER — Ambulatory Visit (HOSPITAL_COMMUNITY)
Admission: RE | Admit: 2018-09-20 | Discharge: 2018-09-21 | Disposition: A | Discharge status: Home / Self Care | Payer: PPO | Source: Ambulatory Visit | Attending: Internal Medicine | Admitting: Internal Medicine

## 2018-09-20 ENCOUNTER — Encounter (HOSPITAL_COMMUNITY): Payer: Self-pay | Admitting: Internal Medicine

## 2018-09-20 DIAGNOSIS — Z8249 Family history of ischemic heart disease and other diseases of the circulatory system: Secondary | ICD-10-CM | POA: Insufficient documentation

## 2018-09-20 DIAGNOSIS — Z9013 Acquired absence of bilateral breasts and nipples: Secondary | ICD-10-CM | POA: Diagnosis not present

## 2018-09-20 DIAGNOSIS — I252 Old myocardial infarction: Secondary | ICD-10-CM | POA: Insufficient documentation

## 2018-09-20 DIAGNOSIS — Z7951 Long term (current) use of inhaled steroids: Secondary | ICD-10-CM | POA: Insufficient documentation

## 2018-09-20 DIAGNOSIS — M1990 Unspecified osteoarthritis, unspecified site: Secondary | ICD-10-CM | POA: Diagnosis not present

## 2018-09-20 DIAGNOSIS — Z79899 Other long term (current) drug therapy: Secondary | ICD-10-CM | POA: Insufficient documentation

## 2018-09-20 DIAGNOSIS — I495 Sick sinus syndrome: Secondary | ICD-10-CM

## 2018-09-20 DIAGNOSIS — J45909 Unspecified asthma, uncomplicated: Secondary | ICD-10-CM | POA: Insufficient documentation

## 2018-09-20 DIAGNOSIS — I2510 Atherosclerotic heart disease of native coronary artery without angina pectoris: Secondary | ICD-10-CM | POA: Diagnosis not present

## 2018-09-20 DIAGNOSIS — I471 Supraventricular tachycardia: Secondary | ICD-10-CM | POA: Insufficient documentation

## 2018-09-20 DIAGNOSIS — Z888 Allergy status to other drugs, medicaments and biological substances status: Secondary | ICD-10-CM | POA: Insufficient documentation

## 2018-09-20 DIAGNOSIS — H8109 Meniere's disease, unspecified ear: Secondary | ICD-10-CM | POA: Diagnosis not present

## 2018-09-20 DIAGNOSIS — Z7901 Long term (current) use of anticoagulants: Secondary | ICD-10-CM | POA: Insufficient documentation

## 2018-09-20 DIAGNOSIS — Z90711 Acquired absence of uterus with remaining cervical stump: Secondary | ICD-10-CM | POA: Diagnosis not present

## 2018-09-20 DIAGNOSIS — K219 Gastro-esophageal reflux disease without esophagitis: Secondary | ICD-10-CM | POA: Insufficient documentation

## 2018-09-20 DIAGNOSIS — I480 Paroxysmal atrial fibrillation: Secondary | ICD-10-CM | POA: Insufficient documentation

## 2018-09-20 DIAGNOSIS — Z959 Presence of cardiac and vascular implant and graft, unspecified: Secondary | ICD-10-CM

## 2018-09-20 HISTORY — PX: PACEMAKER IMPLANT: EP1218

## 2018-09-20 LAB — CBC WITH DIFFERENTIAL/PLATELET
Basophils Absolute: 0 10*3/uL (ref 0.0–0.2)
Basos: 0 %
EOS (ABSOLUTE): 0.1 10*3/uL (ref 0.0–0.4)
Eos: 1 %
Hematocrit: 40.9 % (ref 34.0–46.6)
Hemoglobin: 13.5 g/dL (ref 11.1–15.9)
Immature Grans (Abs): 0 10*3/uL (ref 0.0–0.1)
Immature Granulocytes: 0 %
Lymphocytes Absolute: 0.3 10*3/uL — ABNORMAL LOW (ref 0.7–3.1)
Lymphs: 3 %
MCH: 30.9 pg (ref 26.6–33.0)
MCHC: 33 g/dL (ref 31.5–35.7)
MCV: 94 fL (ref 79–97)
Monocytes Absolute: 0.4 10*3/uL (ref 0.1–0.9)
Monocytes: 4 %
Neutrophils Absolute: 9.9 10*3/uL — ABNORMAL HIGH (ref 1.4–7.0)
Neutrophils: 92 %
Platelets: 177 10*3/uL (ref 150–450)
RBC: 4.37 x10E6/uL (ref 3.77–5.28)
RDW: 12.3 % (ref 11.7–15.4)
WBC: 10.7 10*3/uL (ref 3.4–10.8)

## 2018-09-20 LAB — BASIC METABOLIC PANEL
BUN/Creatinine Ratio: 27 (ref 12–28)
BUN: 23 mg/dL (ref 8–27)
CO2: 23 mmol/L (ref 20–29)
Calcium: 9.6 mg/dL (ref 8.7–10.3)
Chloride: 106 mmol/L (ref 96–106)
Creatinine, Ser: 0.85 mg/dL (ref 0.57–1.00)
GFR calc Af Amer: 71 mL/min/{1.73_m2} (ref >59)
GFR calc non Af Amer: 61 mL/min/{1.73_m2} (ref >59)
Glucose: 141 mg/dL — ABNORMAL HIGH (ref 65–99)
Potassium: 4.8 mmol/L (ref 3.5–5.2)
Sodium: 145 mmol/L — ABNORMAL HIGH (ref 134–144)

## 2018-09-20 LAB — SURGICAL PCR SCREEN
MRSA, PCR: NEGATIVE
Staphylococcus aureus: NEGATIVE

## 2018-09-20 SURGERY — PACEMAKER IMPLANT

## 2018-09-20 MED ORDER — LIDOCAINE HCL (PF) 1 % IJ SOLN
INTRAMUSCULAR | Status: AC
  Filled 2018-09-20: qty 30

## 2018-09-20 MED ORDER — HEPARIN (PORCINE) IN NACL 1000-0.9 UT/500ML-% IV SOLN
INTRAVENOUS | Status: DC | PRN
Start: 2018-09-20 — End: 2018-09-20
  Administered 2018-09-20: 500 mL

## 2018-09-20 MED ORDER — ONDANSETRON HCL 4 MG/2ML IJ SOLN: 4.0000 mg | Freq: Four times a day (QID) | INTRAMUSCULAR | Status: DC | PRN

## 2018-09-20 MED ORDER — SODIUM CHLORIDE 0.9 % IV SOLN
INTRAVENOUS | Status: DC
  Administered 2018-09-20: 10:00:00 via INTRAVENOUS

## 2018-09-20 MED ORDER — MOMETASONE FURO-FORMOTEROL FUM 100-5 MCG/ACT IN AERO
2.0000 | INHALATION_SPRAY | Freq: Two times a day (BID) | RESPIRATORY_TRACT | Status: DC
  Administered 2018-09-20: 2 via RESPIRATORY_TRACT
  Filled 2018-09-20: qty 8.8

## 2018-09-20 MED ORDER — CEFAZOLIN SODIUM-DEXTROSE 1-4 GM/50ML-% IV SOLN
1.0000 g | Freq: Four times a day (QID) | INTRAVENOUS | Status: AC
  Administered 2018-09-20 – 2018-09-21 (×3): 1 g via INTRAVENOUS
  Filled 2018-09-20 (×3): qty 50

## 2018-09-20 MED ORDER — ALBUTEROL SULFATE HFA 108 (90 BASE) MCG/ACT IN AERS: 2.0000 | INHALATION_SPRAY | Freq: Four times a day (QID) | RESPIRATORY_TRACT | Status: DC | PRN

## 2018-09-20 MED ORDER — POLYVINYL ALCOHOL 1.4 % OP SOLN: 1.0000 [drp] | OPHTHALMIC | Status: DC | PRN

## 2018-09-20 MED ORDER — SODIUM CHLORIDE 0.9 % IV SOLN
INTRAVENOUS | Status: AC
  Filled 2018-09-20: qty 2

## 2018-09-20 MED ORDER — DONEPEZIL HCL 5 MG PO TABS
5.0000 mg | ORAL_TABLET | Freq: Every day | ORAL | Status: DC
  Administered 2018-09-20: 5 mg via ORAL
  Filled 2018-09-20: qty 1

## 2018-09-20 MED ORDER — SODIUM CHLORIDE 0.9 % IV SOLN
INTRAVENOUS | Status: AC
  Administered 2018-09-20: 14:00:00 via INTRAVENOUS

## 2018-09-20 MED ORDER — CEFAZOLIN SODIUM-DEXTROSE 2-4 GM/100ML-% IV SOLN
2.0000 g | INTRAVENOUS | Status: AC
  Administered 2018-09-20: 2 g via INTRAVENOUS

## 2018-09-20 MED ORDER — SODIUM CHLORIDE 0.9 % IV SOLN
INTRAVENOUS | Status: DC | PRN
  Administered 2018-09-20 – 2018-09-21 (×2): 1000 mL via INTRAVENOUS

## 2018-09-20 MED ORDER — MUPIROCIN 2 % EX OINT
TOPICAL_OINTMENT | CUTANEOUS | Status: AC
  Administered 2018-09-20: 1 via TOPICAL
  Filled 2018-09-20: qty 22

## 2018-09-20 MED ORDER — DILTIAZEM HCL ER COATED BEADS 120 MG PO CP24: 120.0000 mg | ORAL_CAPSULE | Freq: Every day | ORAL | Status: DC

## 2018-09-20 MED ORDER — VITAMIN B-12 1000 MCG PO TABS
500.0000 ug | ORAL_TABLET | ORAL | Status: DC
  Administered 2018-09-21: 500 ug via ORAL
  Filled 2018-09-20: qty 1

## 2018-09-20 MED ORDER — LIDOCAINE HCL (PF) 1 % IJ SOLN
INTRAMUSCULAR | Status: DC | PRN
  Administered 2018-09-20: 60 mL

## 2018-09-20 MED ORDER — PANTOPRAZOLE SODIUM 40 MG PO TBEC
40.0000 mg | DELAYED_RELEASE_TABLET | Freq: Every day | ORAL | Status: DC
  Administered 2018-09-21: 40 mg via ORAL
  Filled 2018-09-20: qty 1

## 2018-09-20 MED ORDER — NITROGLYCERIN 0.4 MG SL SUBL: 0.4000 mg | SUBLINGUAL_TABLET | SUBLINGUAL | Status: DC | PRN

## 2018-09-20 MED ORDER — LOSARTAN POTASSIUM 50 MG PO TABS
50.0000 mg | ORAL_TABLET | Freq: Every day | ORAL | Status: DC
  Administered 2018-09-21: 50 mg via ORAL
  Filled 2018-09-20: qty 1

## 2018-09-20 MED ORDER — CEFAZOLIN SODIUM-DEXTROSE 2-4 GM/100ML-% IV SOLN
INTRAVENOUS | Status: AC
  Filled 2018-09-20: qty 100

## 2018-09-20 MED ORDER — HYPROMELLOSE (GONIOSCOPIC) 2.5 % OP SOLN: 1.0000 [drp] | Freq: Three times a day (TID) | OPHTHALMIC | Status: DC | PRN

## 2018-09-20 MED ORDER — MONTELUKAST SODIUM 10 MG PO TABS
10.0000 mg | ORAL_TABLET | Freq: Every evening | ORAL | Status: DC
  Administered 2018-09-20: 10 mg via ORAL
  Filled 2018-09-20: qty 1

## 2018-09-20 MED ORDER — MUPIROCIN 2 % EX OINT
1.0000 "application " | TOPICAL_OINTMENT | Freq: Once | CUTANEOUS | Status: AC
  Administered 2018-09-20: 1 via TOPICAL

## 2018-09-20 MED ORDER — ALBUTEROL SULFATE (2.5 MG/3ML) 0.083% IN NEBU: 2.5000 mg | INHALATION_SOLUTION | Freq: Four times a day (QID) | RESPIRATORY_TRACT | Status: DC | PRN

## 2018-09-20 MED ORDER — CHLORHEXIDINE GLUCONATE 4 % EX LIQD
60.0000 mL | Freq: Once | CUTANEOUS | Status: DC
  Filled 2018-09-20: qty 60

## 2018-09-20 MED ORDER — HEPARIN (PORCINE) IN NACL 1000-0.9 UT/500ML-% IV SOLN
INTRAVENOUS | Status: AC
  Filled 2018-09-20: qty 500

## 2018-09-20 MED ORDER — SODIUM CHLORIDE 0.9 % IV SOLN
80.0000 mg | INTRAVENOUS | Status: AC
  Administered 2018-09-20: 80 mg

## 2018-09-20 MED ORDER — ACETAMINOPHEN 325 MG PO TABS: 325.0000 mg | ORAL_TABLET | ORAL | Status: DC | PRN

## 2018-09-20 SURGICAL SUPPLY — 8 items
CABLE SURGICAL S-101-97-12 (CABLE) ×2 IMPLANT
HEMOSTAT SURGICEL 2X4 FIBR (HEMOSTASIS) ×1 IMPLANT
LEAD CAPSURE NOVUS 45CM (Lead) ×1 IMPLANT
LEAD ISOFLEX OPT 1948-58CM (Lead) ×1 IMPLANT
PACEMAKER EDORA 8DR-T MRI (Pacemaker) ×1 IMPLANT
PAD PRO RADIOLUCENT 2001M-C (PAD) ×2 IMPLANT
SHEATH CLASSIC 7F (SHEATH) ×2 IMPLANT
TRAY PACEMAKER INSERTION (PACKS) ×2 IMPLANT

## 2018-09-20 NOTE — Progress Notes (Signed)
With Orhtostatic hypotension will wait for data from PM prior to instituting regular rate control Have d/c dilt Would resume Rivaroxaban on Monday

## 2018-09-20 NOTE — Discharge Instructions (Signed)
° ° °  Supplemental Discharge Instructions for  Pacemaker/Defibrillator Patients  Activity No heavy lifting or vigorous activity with your left/right arm for 6 to 8 weeks.  Do not raise your left/right arm above your head for one week.  Gradually raise your affected arm as drawn below.             09/24/2018                 09/25/2018                 09/26/2018              09/27/2018 __  NO DRIVING for 1 week    ; you may begin driving on  12/10/7679 .  WOUND CARE - Keep the wound area clean and dry.  Do not get this area wet for one week. No showers for one week; you may shower on 09/27/2018  . - The tape/steri-strips on your wound will fall off; do not pull them off.  No bandage is needed on the site.  DO  NOT apply any creams, oils, or ointments to the wound area. - If you notice any drainage or discharge from the wound, any swelling or bruising at the site, or you develop a fever > 101? F after you are discharged home, call the office at once.  Special Instructions - You are still able to use cellular telephones; use the ear opposite the side where you have your pacemaker/defibrillator.  Avoid carrying your cellular phone near your device. - When traveling through airports, show security personnel your identification card to avoid being screened in the metal detectors.  Ask the security personnel to use the hand wand. - Avoid arc welding equipment, MRI testing (magnetic resonance imaging), TENS units (transcutaneous nerve stimulators).  Call the office for questions about other devices. - Avoid electrical appliances that are in poor condition or are not properly grounded. - Microwave ovens are safe to be near or to operate.

## 2018-09-20 NOTE — Interval H&P Note (Signed)
History and Physical Interval Note:  09/20/2018 5:53 PM  Leah Hayden  has presented today for surgery, with the diagnosis of Sick sinus syndrome  The various methods of treatment have been discussed with the patient and family. After consideration of risks, benefits and other options for treatment, the patient has consented to  Procedure(s): PACEMAKER IMPLANT (N/A) as a surgical intervention .  The patient's history has been reviewed, patient examined, no change in status, stable for surgery.  I have reviewed the patient's chart and labs.  Questions were answered to the patient's satisfaction.     Virl Axe  No interval difficulities or questions  The benefits and risks were reviewed including but not limited to death,  perforation, infection, lead dislodgement and device malfunction.  The patient understands agrees and is willing to proceed.

## 2018-09-20 NOTE — H&P (View-Only) (Signed)
With Orhtostatic hypotension will wait for data from PM prior to instituting regular rate control Have d/c dilt Would resume Rivaroxaban on Monday

## 2018-09-21 ENCOUNTER — Encounter (HOSPITAL_COMMUNITY): Payer: Self-pay

## 2018-09-21 ENCOUNTER — Ambulatory Visit (HOSPITAL_COMMUNITY): Payer: PPO

## 2018-09-21 DIAGNOSIS — I480 Paroxysmal atrial fibrillation: Secondary | ICD-10-CM | POA: Diagnosis not present

## 2018-09-21 DIAGNOSIS — Z888 Allergy status to other drugs, medicaments and biological substances status: Secondary | ICD-10-CM | POA: Diagnosis not present

## 2018-09-21 DIAGNOSIS — I471 Supraventricular tachycardia: Secondary | ICD-10-CM | POA: Diagnosis not present

## 2018-09-21 DIAGNOSIS — J45909 Unspecified asthma, uncomplicated: Secondary | ICD-10-CM | POA: Diagnosis not present

## 2018-09-21 DIAGNOSIS — Z7901 Long term (current) use of anticoagulants: Secondary | ICD-10-CM | POA: Diagnosis not present

## 2018-09-21 DIAGNOSIS — I252 Old myocardial infarction: Secondary | ICD-10-CM | POA: Diagnosis not present

## 2018-09-21 DIAGNOSIS — Z7951 Long term (current) use of inhaled steroids: Secondary | ICD-10-CM | POA: Diagnosis not present

## 2018-09-21 DIAGNOSIS — I2510 Atherosclerotic heart disease of native coronary artery without angina pectoris: Secondary | ICD-10-CM | POA: Diagnosis not present

## 2018-09-21 DIAGNOSIS — K219 Gastro-esophageal reflux disease without esophagitis: Secondary | ICD-10-CM | POA: Diagnosis not present

## 2018-09-21 DIAGNOSIS — I495 Sick sinus syndrome: Secondary | ICD-10-CM | POA: Diagnosis not present

## 2018-09-21 DIAGNOSIS — Z950 Presence of cardiac pacemaker: Secondary | ICD-10-CM | POA: Diagnosis not present

## 2018-09-21 DIAGNOSIS — M1990 Unspecified osteoarthritis, unspecified site: Secondary | ICD-10-CM | POA: Diagnosis not present

## 2018-09-21 DIAGNOSIS — H8109 Meniere's disease, unspecified ear: Secondary | ICD-10-CM | POA: Diagnosis not present

## 2018-09-21 NOTE — Progress Notes (Signed)
   Progress Note  Patient Name: Leah Hayden Date of Encounter: 09/21/2018  Primary Cardiologist: Ida Rogue, MD   Subjective   Feels weak this morning. No chest pain or sob.   Inpatient Medications    Scheduled Meds: . donepezil  5 mg Oral QHS  . losartan  50 mg Oral Daily  . mometasone-formoterol  2 puff Inhalation BID  . montelukast  10 mg Oral QPM  . pantoprazole  40 mg Oral Daily  . vitamin B-12  500 mcg Oral QODAY   Continuous Infusions: . sodium chloride 1,000 mL (09/21/18 0514)   PRN Meds: sodium chloride, acetaminophen, albuterol, nitroGLYCERIN, ondansetron (ZOFRAN) IV, polyvinyl alcohol   Vital Signs    Vitals:   09/21/18 0013 09/21/18 0230 09/21/18 0545 09/21/18 0823  BP: (!) 131/49  (!) 148/56 (!) 160/58  Pulse: 60  60 61  Resp: 16  18 20   Temp: 97.8 F (36.6 C)  99.2 F (37.3 C) 98.7 F (37.1 C)  TempSrc: Oral  Oral Oral  SpO2: 95%  97% 98%  Weight:  62.1 kg    Height:        Intake/Output Summary (Last 24 hours) at 09/21/2018 0841 Last data filed at 09/21/2018 0300 Gross per 24 hour  Intake 540.49 ml  Output 600 ml  Net -59.51 ml   Filed Weights   09/20/18 0914 09/20/18 1322 09/21/18 0230  Weight: 63 kg 61.2 kg 62.1 kg    Telemetry    nsr with atrial pacing - Personally Reviewed  ECG    nsr with atrial pacing - Personally Reviewed  Physical Exam   GEN: No acute distress.   Neck: No JVD Cardiac: RRR, no murmurs, rubs, or gallops.  Respiratory: Clear to auscultation bilaterally. Left chest with no hematoma. GI: Soft, nontender, non-distended  MS: No edema; No deformity. Neuro:  Nonfocal  Psych: Normal affect   Labs    Chemistry Recent Labs  Lab 09/19/18 1218  NA 145*  K 4.8  CL 106  CO2 23  GLUCOSE 141*  BUN 23  CREATININE 0.85  CALCIUM 9.6  GFRNONAA 61  GFRAA 71     Hematology Recent Labs  Lab 09/19/18 1218  WBC 10.7  RBC 4.37  HGB 13.5  HCT 40.9  MCV 94  MCH 30.9  MCHC 33.0  RDW 12.3  PLT 177     Cardiac EnzymesNo results for input(s): TROPONINI in the last 168 hours. No results for input(s): TROPIPOC in the last 168 hours.   BNPNo results for input(s): BNP, PROBNP in the last 168 hours.   DDimer No results for input(s): DDIMER in the last 168 hours.   Radiology    No results found.  Cardiac Studies   none  Patient Profile     85 y.o. female admitted for PPM insertion due to sinus node dysfunction  Assessment & Plan    1. PPM - interogation of her PPM under my supervision demonstrates normal device function. Her cxr demonstrates stable lead position. She will be discharged home with usual followup. Continue current meds.  Gregg Taylor,M.D.  For questions or updates, please contact [place] Please consult [email or web address] for contact info under Cardiology/STEMI.      Signed, Cristopher Peru, MD  09/21/2018, 8:41 AM  Patient ID: Leah Hayden, female   DOB: Feb 18, 1928, 85 y.o.   MRN: [id number]

## 2018-09-21 NOTE — Progress Notes (Signed)
Discharges instructions given to patient and family members. All questions answered and concerns addressed.

## 2018-09-21 NOTE — Discharge Summary (Addendum)
Discharge Summary    Patient ID: Leah Hayden MRN: [id number]; DOB: 04-02-1928  Admit date: 09/20/2018 Discharge date: 09/21/2018  Primary Care Provider: Venia Carbon, MD  Primary Cardiologist: Leah Rogue, MD   Discharge Diagnoses    Active Problems:   Sinus node dysfunction (HCC)   PAF   CAD  Allergies Allergies  Allergen Reactions  . Amiodarone Other (See Comments)    Chest discomfort  . Eliquis [Apixaban] Rash  . Other Rash    oramycin    Diagnostic Studies/Procedures    PACEMAKER IMPLANT  Leah Hayden [id number] @CSN @  Preop Dx: sinus node dysfunction Postop Dx same/  Procedure:dual chamber pacemaker insertion  After routine prep and drape, lidocaine was infiltrated in the prepectoral subclavicular region on the left side an incision was made and carried down to layer of the prepectoral fascia using electrocautery and sharp dissection; a pocket was formed similarly. Hemostasis was obtained.  After this, we turned our attention to gaining access to the extrathoracic,left subclavian vein. This was accomplished without difficulty and without the aspiration of air or puncture of the artery. 2 separate venipunctures were accomplished; guidewires were placed and retained and sequentially 7 French sheaths were placed through which were passed a [facility] ventricular lead, serial number [id number] and a Medtronic MRI compatible 5076 atrial lead, serial number [id number].  The ventricular lead was manipulated to the right ventricular apex where the bipolar R wave was 10., the pacing impedance was 762, the threshold was 0.4 V @ 0.4 msec . Current at threshold was 0.7 ma.  The right atrial lead was manipulated to the right atrial appendage where the bipolar P-wave was 2.7, the pacing impedance was 565, the threshold was 1.0 V @ 0.4 msec. Current at threshold was 2.0 ma AND THE CURRENT of injury brisk   The leads were affixed to the prepectoral fascia and attached to  a Biotronik pulse generator. Serial number [id number]  Hemostasis was obtained. The pocket was copiously irrigated with antibiotic containing saline solution. The leads and the pulse generator were placed in the pocket and affixed to the prepectoral fascia. Surgicell placed in pocket . The wound was then closed in 2 layers in normal fashion. The wound was washed dried and a dermabond was applied. Needle count, sponge count and instrument counts were correct at the end of the procedure. The patient tolerated the procedure without apparent complication.   Leah Hayden M.D. 09/20/2018 1:02 PM   History of Present Illness     Leah Hayden is a 85 y.o. female with history of a NSTEMI in 11/2013 nonobstructive CAD by LHC in 11/2013, PAF on Xarelto complicated by post-termination pauses, SVT, tachy-brady syndrome, dementia, breast cancer, Meniere's disease, asthma, diverticulosis, and GERD presents for pacemaker implantations.    She has a number of rather nondescript symptoms including irritability weakness and dizziness which occurred in the context of known atrial fibrillation.   Atrial fibrillation diagnosed 2012.  Placed initially with amiodarone and anticoagulated with apixaban.  4/15 was noted to have a 7-second posttermination pause.  Amiodarone was discontinued and she was treated with flecainide.  Anticoagulation was changed to Xarelto. 8/19 had repeat posttermination pause documented in hospital 5.9 seconds.  She has tachybradycardia syndrome and with a prolonged pauses is at risk for falling with syncope.  Hence, pacing is indicated.  In addition, she has rapid atrial fibrillation the burden of which is not at all clear.   Hospital Course  Consultants: None  Patient underwent successful St Jude dual-chamber pacemaker insertion.  Tolerated procedure well.  No postprocedure complication.  Chest x-ray showed stable lead position without pneumothorax.  Pacemaker showed normal device  function. Dr. Caryl Hayden recommended with Leah Hayden will wait for data from PM prior to instituting regular rate control. Would resume Rivaroxaban on Monday.  She has been seen by Dr. Lovena Hayden  today and deemed ready for discharge home. All follow-up appointments have been scheduled. Discharge medications are listed below.    Discharge Vitals Blood pressure (!) 160/58, pulse 61, temperature 98.7 F (37.1 C), temperature source Oral, resp. rate 20, height 5\' 4"  (1.626 m), weight 62.1 kg, SpO2 98 %.  Filed Weights   09/20/18 0914 09/20/18 1322 09/21/18 0230  Weight: 63 kg 61.2 kg 62.1 kg    Labs & Radiologic Studies    CBC Recent Labs    09/19/18 1218  WBC 10.7  NEUTROABS 9.9*  HGB 13.5  HCT 40.9  MCV 94  PLT 923   Basic Metabolic Panel Recent Labs    09/19/18 1218  NA 145*  K 4.8  CL 106  CO2 23  GLUCOSE 141*  BUN 23  CREATININE 0.85  CALCIUM 9.6    _____________  Dg Chest 2 View  Result Date: 09/21/2018 CLINICAL DATA:  Pacemaker placement EXAM: CHEST - 2 VIEW COMPARISON:  07/25/2018 FINDINGS: Dual-chamber pacer leads from the left overlap the right atrial appendage and ventricular right apex. Chronic cardiomegaly with stable mediastinal contours. There is no edema, consolidation, significant effusion, or pneumothorax. IMPRESSION: No unexpected finding after dual-chamber pacer placement. Electronically Signed   By: Monte Fantasia M.D.   On: 09/21/2018 09:35   Disposition   Pt is being discharged home today in good condition.  Follow-up Plans & Appointments    Follow-up Information    [place] Follow up.   Specialty:  Cardiology Why:  09/30/2018 @ 12:00PM (noon), wound check visit Contact information: [address], [address]       Leah Sprang, MD Follow up.   Specialty:  Cardiology Why:  12/24/2018 @ 9:45AM Contact information: [address] [place] [address] [phone]          Discharge Instructions    Diet - low sodium heart healthy   Complete by:  As directed    Increase activity slowly   Complete by:  As directed       Discharge Medications   Allergies as of 09/21/2018      Reactions   Amiodarone Other (See Comments)   Chest discomfort   Eliquis [apixaban] Rash   Other Rash   oramycin      Medication List    TAKE these medications   ADVAIR DISKUS 100-50 MCG/DOSE Aepb Generic drug:  Fluticasone-Salmeterol Inhale 1 puff into the lungs daily as needed (shortness of breath or wheezing).   CENTRUM SILVER 50+WOMEN Tabs Take 1 tablet by mouth daily.   diltiazem 30 MG tablet Commonly known as:  CARDIZEM Take 1 tablet (30 mg) by mouth every 6-8 hours as needed for breakthrough a-fib   donepezil 5 MG tablet Commonly known as:  ARICEPT TAKE 1 TABLET (5 MG TOTAL) BY MOUTH AT BEDTIME.   hydroxypropyl methylcellulose / hypromellose 2.5 % ophthalmic solution Commonly known as:  ISOPTO TEARS / GONIOVISC Place 1 drop into both eyes 3 (three) times daily as needed for dry eyes.   losartan 25  MG tablet Commonly known as:  COZAAR Take 2 tablets (50 mg total) by mouth daily.   montelukast 10 MG tablet Commonly known as:  SINGULAIR Take 10 mg by mouth every evening.   nitroGLYCERIN 0.4 MG SL tablet Commonly known as:  NITROSTAT Place 1 tablet (0.4 mg total) under the tongue every 5 (five) minutes as needed for chest pain.   pantoprazole 40 MG tablet Commonly known as:  PROTONIX Take 1 tablet (40 mg total) by mouth daily.   PROVENTIL HFA 108 (90 Base) MCG/ACT inhaler Generic drug:  albuterol Inhale 2 puffs into the lungs every 6 (six) hours as needed for wheezing or shortness of breath.   Rivaroxaban 15 MG Tabs tablet Commonly known as:  XARELTO Take 1 tablet (15 mg total) by mouth daily with supper.   vitamin B-12 500 MCG tablet Commonly known as:  CYANOCOBALAMIN Take 500 mcg by mouth  every other day.   VITAMIN D PO Take 5,000 Units by mouth daily.        Acute coronary syndrome (MI, NSTEMI, STEMI, etc) this admission?: No.    Outstanding Labs/Studies   None  Duration of Discharge Encounter   Greater than 30 minutes including physician time.  Leah Soho, PA 09/21/2018, 11:33 AM   EP Attending  Patient seen and examined. Agree with above. The patient is stable and may be discharged home. See my note as well.  Mikle Bosworth.D.

## 2018-09-23 ENCOUNTER — Ambulatory Visit (INDEPENDENT_AMBULATORY_CARE_PROVIDER_SITE_OTHER): Payer: PPO | Admitting: Family Medicine

## 2018-09-23 ENCOUNTER — Encounter: Payer: Self-pay | Admitting: Family Medicine

## 2018-09-23 ENCOUNTER — Telehealth: Payer: Self-pay | Admitting: Internal Medicine

## 2018-09-23 VITALS — BP 148/74 | HR 74 | Temp 98.0°F | Ht 64.0 in | Wt 139.5 lb

## 2018-09-23 DIAGNOSIS — F0150 Vascular dementia without behavioral disturbance: Secondary | ICD-10-CM

## 2018-09-23 DIAGNOSIS — R300 Dysuria: Secondary | ICD-10-CM

## 2018-09-23 LAB — POCT URINALYSIS DIPSTICK
Bilirubin, UA: NEGATIVE
Blood, UA: NEGATIVE
Glucose, UA: NEGATIVE
Nitrite, UA: NEGATIVE
Protein, UA: POSITIVE — AB
Spec Grav, UA: 1.025 (ref 1.010–1.025)
Urobilinogen, UA: 0.2 E.U./dL
pH, UA: 5 (ref 5.0–8.0)

## 2018-09-23 MED ORDER — SULFAMETHOXAZOLE-TRIMETHOPRIM 800-160 MG PO TABS: 1.0000 | ORAL_TABLET | Freq: Two times a day (BID) | ORAL | 0 refills | Status: AC

## 2018-09-23 MED FILL — Gentamicin Sulfate Inj 40 MG/ML: INTRAMUSCULAR | Qty: 80 | Status: AC

## 2018-09-23 NOTE — Progress Notes (Signed)
Subjective:     Leah Hayden is a 85 y.o. female presenting for Dysuria (a little bit of pain with urination. Daughters have noticed changes in her behavior and wonders if she has a UTI. Had some diarrhea on 09/19/2018. No fever. Raw sensation in the bottom area.)     HPI  #Dysuria - pain w/ urination - daughter also noticed changes in behavior - more angry at sitter lately  - yesterday sitter found her washing her infusion site and there was an issue over that - children had to come over due to disagreement - yesterday AM was fine with son - sitter returned and and Leah Hayden was disoriented, not talkative - Jackelyn Poling is Conservation officer, nature - felt that she was loud - stays at home, but difficulty finding sitters to stay with her   Review of Systems  Constitutional: Negative.   HENT: Negative.   Gastrointestinal: Positive for diarrhea and nausea. Negative for abdominal pain and vomiting.  Genitourinary: Positive for difficulty urinating. Negative for dysuria, flank pain, frequency and vaginal pain.  Neurological:       Behavior change     Social History   Tobacco Use  Smoking Status Never Smoker  Smokeless Tobacco Never Used        Objective:    BP Readings from Last 3 Encounters:  09/23/18 (!) 148/74  09/21/18 (!) 159/61  09/19/18 126/60   Wt Readings from Last 3 Encounters:  09/23/18 139 lb 8 oz (63.3 kg)  09/21/18 137 lb (62.1 kg)  09/19/18 137 lb 12 oz (62.5 kg)    BP (!) 148/74   Pulse 74   Temp 98 F (36.7 C)   Ht 5\' 4"  (1.626 m)   Wt 139 lb 8 oz (63.3 kg)   BMI 23.95 kg/m    Physical Exam Constitutional:      General: She is not in acute distress.    Appearance: She is well-developed. She is not diaphoretic.  HENT:     Right Ear: External ear normal.     Left Ear: External ear normal.     Nose: Nose normal.  Eyes:     Conjunctiva/sclera: Conjunctivae normal.  Neck:     Musculoskeletal: Neck supple.  Cardiovascular:     Rate and Rhythm: Normal rate  and regular rhythm.     Heart sounds: No murmur.  Pulmonary:     Effort: Pulmonary effort is normal. No respiratory distress.     Breath sounds: Normal breath sounds. No wheezing.  Abdominal:     General: Abdomen is flat. Bowel sounds are normal. There is no distension.     Palpations: Abdomen is soft.     Tenderness: There is no abdominal tenderness. There is no right CVA tenderness or left CVA tenderness.  Skin:    General: Skin is warm and dry.     Capillary Refill: Capillary refill takes less than 2 seconds.  Neurological:     Mental Status: She is alert. Mental status is at baseline.  Psychiatric:        Mood and Affect: Mood normal.        Behavior: Behavior normal.      UA: +LE     Assessment & Plan:   Problem List Items Addressed This Visit      Cardiovascular and Mediastinum   Vascular dementia, uncomplicated ([place])    Pt with dementia and now with recent behavior change which seems to be waxing and waning. Could be UTI, with ?dysuria  complaint - however also with recent disagreement with sitter which may explain some of her behavior.        Other Visit Diagnoses    Dysuria    -  Primary   Relevant Medications   sulfamethoxazole-trimethoprim (BACTRIM DS,SEPTRA DS) 800-160 MG tablet   Other Relevant Orders   POCT urinalysis dipstick (Completed)   Urine Culture      UA with LE will treat but follow-up culture. Discussed that behavior changes may be more related to dementia and social interaction than UTI but feel that treatment for prevention is important.   Return if symptoms worsen or fail to improve.  Lesleigh Noe, MD

## 2018-09-23 NOTE — Telephone Encounter (Signed)
Pt's daughter is requesting a call from [place] to discuss her mother. She states the pt has been acting really distant and strange.

## 2018-09-23 NOTE — Patient Instructions (Signed)
Antibiotic sent the pharmacy for possible UTI  I will send it for culture as well. And let you know if changes should be made

## 2018-09-23 NOTE — Telephone Encounter (Signed)
Seeing Dr Einar Pheasant today for a possible UTI. She is not acting like herself. She is getting more angry at her sitter. This is not a new thing, though. Really distant. [name] wanted me to let Dr Einar Pheasant and Azalee Course know before the visit so she would not have to say those things in front of the pt.

## 2018-09-23 NOTE — Telephone Encounter (Signed)
Noted. Dr. Einar Pheasant advised

## 2018-09-23 NOTE — Assessment & Plan Note (Signed)
Pt with dementia and now with recent behavior change which seems to be waxing and waning. Could be UTI, with ?dysuria complaint - however also with recent disagreement with sitter which may explain some of her behavior.

## 2018-09-24 LAB — URINE CULTURE
MICRO NUMBER:: 173266
SPECIMEN QUALITY:: ADEQUATE

## 2018-09-26 ENCOUNTER — Telehealth: Payer: Self-pay

## 2018-09-26 NOTE — Telephone Encounter (Signed)
Pt returned call to Anastasiya. The pt was given a 3 day supply on the antibiotic. Pt will not have anything to take tonight as a FYI.

## 2018-09-26 NOTE — Telephone Encounter (Signed)
Left message to be advised of results.

## 2018-09-26 NOTE — Telephone Encounter (Signed)
Leah Hayden advised of results.

## 2018-09-30 ENCOUNTER — Telehealth: Payer: Self-pay | Admitting: *Deleted

## 2018-09-30 ENCOUNTER — Ambulatory Visit: Payer: PPO | Admitting: *Deleted

## 2018-09-30 NOTE — Telephone Encounter (Signed)
Leah Hayden,  In response to your question this morning-  OK to cancel March appointment with Dr. Rockey Situ. Do you mind following up with her/ her family.  Thanks!

## 2018-09-30 NOTE — Telephone Encounter (Signed)
Hey, I got in touch.  Thank you!

## 2018-09-30 NOTE — Telephone Encounter (Signed)
-----   Message from Rise Mu, PA-C sent at 09/30/2018 12:46 PM EST ----- It appears, based on SK note, and discharge summary, we can wait until her appointment with SK in 12/2018 to see her as he wants to have data from her PPM.  ----- Message ----- From: Emily Filbert, RN Sent: 09/30/2018  11:29 AM EST To: Emily Filbert, RN, Ryan Lyn Hollingshead, PA-C  [facility] saw this patient and paced her last week. She is due to see TG in March per checkout with you. Does she really need that or can SK just see her in May at her 53 implant visit?

## 2018-10-03 LAB — CUP PACEART INCLINIC DEVICE CHECK
Date Time Interrogation Session: 20200217172800
Implantable Lead Implant Date: 20200207
Implantable Lead Implant Date: 20200207
Implantable Lead Location: 753859
Implantable Lead Location: 753860
Implantable Lead Model: 5076
Implantable Pulse Generator Implant Date: 20200207
Lead Channel Impedance Value: 487 Ohm
Lead Channel Impedance Value: 663 Ohm
Lead Channel Pacing Threshold Amplitude: 0.6 V
Lead Channel Pacing Threshold Amplitude: 0.6 V
Lead Channel Pacing Threshold Amplitude: 0.7 V
Lead Channel Pacing Threshold Amplitude: 0.7 V
Lead Channel Pacing Threshold Amplitude: 0.7 V
Lead Channel Pacing Threshold Amplitude: 0.8 V
Lead Channel Pacing Threshold Pulse Width: 0.4 ms
Lead Channel Pacing Threshold Pulse Width: 0.4 ms
Lead Channel Pacing Threshold Pulse Width: 0.4 ms
Lead Channel Pacing Threshold Pulse Width: 0.4 ms
Lead Channel Pacing Threshold Pulse Width: 0.4 ms
Lead Channel Pacing Threshold Pulse Width: 0.4 ms
Lead Channel Sensing Intrinsic Amplitude: 14.4 mV
Lead Channel Sensing Intrinsic Amplitude: 14.6 mV
Lead Channel Sensing Intrinsic Amplitude: 4 mV
Lead Channel Sensing Intrinsic Amplitude: 4.1 mV
Lead Channel Setting Pacing Amplitude: 3 V
Lead Channel Setting Pacing Amplitude: 3 V
Lead Channel Setting Pacing Pulse Width: 0.4 ms
Pulse Gen Model: 407145
Pulse Gen Serial Number: 69503042

## 2018-10-31 ENCOUNTER — Ambulatory Visit: Payer: PPO | Admitting: Cardiovascular Disease

## 2018-11-08 ENCOUNTER — Telehealth: Payer: Self-pay | Admitting: Internal Medicine

## 2018-11-08 NOTE — Telephone Encounter (Signed)
Daughter of Pt called. Pt has been going into Afib recently.  Daughter says that she was on medication to control the afib before the pacemaker was put in. They were told that the pacemaker may control the afib without medication, but medication may be necessary.  Daughter wants to know if you could check remotely the Pt's monitor to make sure that the pt was in fact in afib.   If medication is necessary, please send meds to [id number] - [place], [place] - [address]

## 2018-11-08 NOTE — Telephone Encounter (Signed)
Reviewed request and routed to device clinic to see if they can do remote check to determine if she is in afib.

## 2018-11-09 ENCOUNTER — Encounter: Payer: Self-pay | Admitting: Cardiology

## 2018-11-09 NOTE — Telephone Encounter (Signed)
This encounter was created in error - please disregard.

## 2018-11-10 NOTE — Telephone Encounter (Signed)
Are we able to remote

## 2018-11-11 NOTE — Telephone Encounter (Signed)
Patient's home monitor transmits nightly. As of overnight transmission on 11/11/18: 0% atrial burden/day, no histogram data or presenting rhythm available for review (Biotronik PPM). Data shows some mode switches, though no episodes likely met detection for storage (no atrial arrhythmia EGMs available for review). 1 HVR EGM available from 10/10/18--EGM shows 1:1 SVT, 6 sec duration.  Routed to Dr. Caryl Comes and Nira Conn, RN for review and recommendations.

## 2018-11-12 NOTE — Telephone Encounter (Signed)
Spoke with [name]. Advised of normal PPM function and no significant episodes to correlate with patient's symptoms. Per [name], pt historically has slept through the night without any issues. Pt has woken up abruptly around 0500 three nights recently with a variety of symptoms, including weakness, nausea one night, and diarrhea another night. By the time family or her aide gets to her bedside and gives her a drink, symptoms seem to already be improving. BP is initially higher than normal (797 systolic), then gradually returns to 282S systolic. [name] thinks the pt gets upset about whatever she is feeling, causing her BP to rise. They do not have a way to check her blood sugar. She reports they assumed it was AF episodes causing these symptoms because they are unsure what else it could be.  Advised it does not sound like a heart rhythm issue, but I will send this message to Dr. Caryl Comes for any additional recommendations.

## 2018-11-12 NOTE — Telephone Encounter (Signed)
NO AFIB AT ALL detected by her device

## 2018-11-15 NOTE — Telephone Encounter (Signed)
Noted  

## 2018-11-18 NOTE — Telephone Encounter (Signed)
Any recommendations per Emily's last note on 3/31?

## 2018-11-18 NOTE — Telephone Encounter (Signed)
Did not have any   I was hoping that her symptoms were self limited  Have we heard from her daughter

## 2018-11-19 ENCOUNTER — Observation Stay
Admission: EM | Admit: 2018-11-19 | Discharge: 2018-11-20 | Disposition: A | Discharge status: Home / Self Care | Payer: PPO | Attending: Internal Medicine | Admitting: Internal Medicine

## 2018-11-19 ENCOUNTER — Encounter: Payer: Self-pay | Admitting: Emergency Medicine

## 2018-11-19 ENCOUNTER — Ambulatory Visit: Payer: PPO | Admitting: Podiatry

## 2018-11-19 ENCOUNTER — Emergency Department: Payer: PPO

## 2018-11-19 ENCOUNTER — Other Ambulatory Visit: Payer: Self-pay

## 2018-11-19 DIAGNOSIS — M159 Polyosteoarthritis, unspecified: Secondary | ICD-10-CM | POA: Diagnosis not present

## 2018-11-19 DIAGNOSIS — N183 Chronic kidney disease, stage 3 (moderate): Secondary | ICD-10-CM | POA: Diagnosis not present

## 2018-11-19 DIAGNOSIS — D720 Genetic anomalies of leukocytes: Secondary | ICD-10-CM | POA: Diagnosis not present

## 2018-11-19 DIAGNOSIS — Z888 Allergy status to other drugs, medicaments and biological substances status: Secondary | ICD-10-CM | POA: Insufficient documentation

## 2018-11-19 DIAGNOSIS — E538 Deficiency of other specified B group vitamins: Secondary | ICD-10-CM | POA: Diagnosis not present

## 2018-11-19 DIAGNOSIS — I252 Old myocardial infarction: Secondary | ICD-10-CM | POA: Insufficient documentation

## 2018-11-19 DIAGNOSIS — Z1509 Genetic susceptibility to other malignant neoplasm: Secondary | ICD-10-CM | POA: Diagnosis not present

## 2018-11-19 DIAGNOSIS — M810 Age-related osteoporosis without current pathological fracture: Secondary | ICD-10-CM | POA: Diagnosis not present

## 2018-11-19 DIAGNOSIS — N179 Acute kidney failure, unspecified: Secondary | ICD-10-CM | POA: Diagnosis not present

## 2018-11-19 DIAGNOSIS — Z79899 Other long term (current) drug therapy: Secondary | ICD-10-CM | POA: Diagnosis not present

## 2018-11-19 DIAGNOSIS — Z8249 Family history of ischemic heart disease and other diseases of the circulatory system: Secondary | ICD-10-CM | POA: Diagnosis not present

## 2018-11-19 DIAGNOSIS — R531 Weakness: Secondary | ICD-10-CM | POA: Insufficient documentation

## 2018-11-19 DIAGNOSIS — L218 Other seborrheic dermatitis: Secondary | ICD-10-CM | POA: Diagnosis not present

## 2018-11-19 DIAGNOSIS — Q649 Congenital malformation of urinary system, unspecified: Secondary | ICD-10-CM | POA: Diagnosis not present

## 2018-11-19 DIAGNOSIS — I25119 Atherosclerotic heart disease of native coronary artery with unspecified angina pectoris: Secondary | ICD-10-CM | POA: Diagnosis not present

## 2018-11-19 DIAGNOSIS — D649 Anemia, unspecified: Secondary | ICD-10-CM | POA: Diagnosis not present

## 2018-11-19 DIAGNOSIS — I959 Hypotension, unspecified: Secondary | ICD-10-CM | POA: Diagnosis not present

## 2018-11-19 DIAGNOSIS — D485 Neoplasm of uncertain behavior of skin: Secondary | ICD-10-CM | POA: Diagnosis not present

## 2018-11-19 DIAGNOSIS — Z7901 Long term (current) use of anticoagulants: Secondary | ICD-10-CM | POA: Insufficient documentation

## 2018-11-19 DIAGNOSIS — G4700 Insomnia, unspecified: Secondary | ICD-10-CM | POA: Diagnosis not present

## 2018-11-19 DIAGNOSIS — M8580 Other specified disorders of bone density and structure, unspecified site: Secondary | ICD-10-CM | POA: Diagnosis not present

## 2018-11-19 DIAGNOSIS — R778 Other specified abnormalities of plasma proteins: Secondary | ICD-10-CM | POA: Diagnosis present

## 2018-11-19 DIAGNOSIS — F331 Major depressive disorder, recurrent, moderate: Secondary | ICD-10-CM | POA: Diagnosis not present

## 2018-11-19 DIAGNOSIS — L570 Actinic keratosis: Secondary | ICD-10-CM | POA: Diagnosis not present

## 2018-11-19 DIAGNOSIS — N189 Chronic kidney disease, unspecified: Secondary | ICD-10-CM | POA: Diagnosis present

## 2018-11-19 DIAGNOSIS — Z9013 Acquired absence of bilateral breasts and nipples: Secondary | ICD-10-CM | POA: Diagnosis not present

## 2018-11-19 DIAGNOSIS — E782 Mixed hyperlipidemia: Secondary | ICD-10-CM | POA: Diagnosis not present

## 2018-11-19 DIAGNOSIS — M48062 Spinal stenosis, lumbar region with neurogenic claudication: Secondary | ICD-10-CM | POA: Diagnosis not present

## 2018-11-19 DIAGNOSIS — K219 Gastro-esophageal reflux disease without esophagitis: Secondary | ICD-10-CM | POA: Diagnosis present

## 2018-11-19 DIAGNOSIS — Z85820 Personal history of malignant melanoma of skin: Secondary | ICD-10-CM | POA: Diagnosis not present

## 2018-11-19 DIAGNOSIS — M48061 Spinal stenosis, lumbar region without neurogenic claudication: Secondary | ICD-10-CM | POA: Diagnosis not present

## 2018-11-19 DIAGNOSIS — C44619 Basal cell carcinoma of skin of left upper limb, including shoulder: Secondary | ICD-10-CM | POA: Diagnosis not present

## 2018-11-19 DIAGNOSIS — Z23 Encounter for immunization: Secondary | ICD-10-CM | POA: Diagnosis not present

## 2018-11-19 DIAGNOSIS — R000 Tachycardia, unspecified: Secondary | ICD-10-CM | POA: Diagnosis not present

## 2018-11-19 DIAGNOSIS — Z20828 Contact with and (suspected) exposure to other viral communicable diseases: Secondary | ICD-10-CM | POA: Diagnosis not present

## 2018-11-19 DIAGNOSIS — L814 Other melanin hyperpigmentation: Secondary | ICD-10-CM | POA: Diagnosis not present

## 2018-11-19 DIAGNOSIS — F0150 Vascular dementia without behavioral disturbance: Secondary | ICD-10-CM | POA: Insufficient documentation

## 2018-11-19 DIAGNOSIS — J4520 Mild intermittent asthma, uncomplicated: Secondary | ICD-10-CM | POA: Insufficient documentation

## 2018-11-19 DIAGNOSIS — J4540 Moderate persistent asthma, uncomplicated: Secondary | ICD-10-CM | POA: Diagnosis not present

## 2018-11-19 DIAGNOSIS — Z66 Do not resuscitate: Secondary | ICD-10-CM | POA: Diagnosis not present

## 2018-11-19 DIAGNOSIS — R319 Hematuria, unspecified: Secondary | ICD-10-CM | POA: Diagnosis not present

## 2018-11-19 DIAGNOSIS — F411 Generalized anxiety disorder: Secondary | ICD-10-CM | POA: Diagnosis not present

## 2018-11-19 DIAGNOSIS — I480 Paroxysmal atrial fibrillation: Secondary | ICD-10-CM | POA: Diagnosis not present

## 2018-11-19 DIAGNOSIS — R7989 Other specified abnormal findings of blood chemistry: Secondary | ICD-10-CM | POA: Diagnosis present

## 2018-11-19 DIAGNOSIS — D6869 Other thrombophilia: Secondary | ICD-10-CM | POA: Diagnosis not present

## 2018-11-19 DIAGNOSIS — I248 Other forms of acute ischemic heart disease: Secondary | ICD-10-CM | POA: Insufficient documentation

## 2018-11-19 DIAGNOSIS — Z950 Presence of cardiac pacemaker: Secondary | ICD-10-CM | POA: Insufficient documentation

## 2018-11-19 DIAGNOSIS — Z0000 Encounter for general adult medical examination without abnormal findings: Secondary | ICD-10-CM | POA: Diagnosis not present

## 2018-11-19 DIAGNOSIS — E785 Hyperlipidemia, unspecified: Secondary | ICD-10-CM | POA: Diagnosis present

## 2018-11-19 DIAGNOSIS — I10 Essential (primary) hypertension: Secondary | ICD-10-CM | POA: Diagnosis not present

## 2018-11-19 DIAGNOSIS — D229 Melanocytic nevi, unspecified: Secondary | ICD-10-CM | POA: Diagnosis not present

## 2018-11-19 DIAGNOSIS — E876 Hypokalemia: Secondary | ICD-10-CM | POA: Diagnosis not present

## 2018-11-19 DIAGNOSIS — I4891 Unspecified atrial fibrillation: Secondary | ICD-10-CM | POA: Diagnosis present

## 2018-11-19 DIAGNOSIS — E039 Hypothyroidism, unspecified: Secondary | ICD-10-CM | POA: Diagnosis not present

## 2018-11-19 DIAGNOSIS — Z1501 Genetic susceptibility to malignant neoplasm of breast: Secondary | ICD-10-CM | POA: Diagnosis not present

## 2018-11-19 DIAGNOSIS — N3000 Acute cystitis without hematuria: Secondary | ICD-10-CM | POA: Diagnosis not present

## 2018-11-19 DIAGNOSIS — I25708 Atherosclerosis of coronary artery bypass graft(s), unspecified, with other forms of angina pectoris: Secondary | ICD-10-CM | POA: Diagnosis not present

## 2018-11-19 DIAGNOSIS — N1832 Chronic kidney disease, stage 3b: Secondary | ICD-10-CM | POA: Diagnosis not present

## 2018-11-19 DIAGNOSIS — I495 Sick sinus syndrome: Secondary | ICD-10-CM | POA: Diagnosis not present

## 2018-11-19 DIAGNOSIS — C189 Malignant neoplasm of colon, unspecified: Secondary | ICD-10-CM | POA: Diagnosis not present

## 2018-11-19 DIAGNOSIS — L821 Other seborrheic keratosis: Secondary | ICD-10-CM | POA: Diagnosis not present

## 2018-11-19 LAB — CBC
HCT: 41.8 % (ref 36.0–46.0)
Hemoglobin: 13.6 g/dL (ref 12.0–15.0)
MCH: 30.6 pg (ref 26.0–34.0)
MCHC: 32.5 g/dL (ref 30.0–36.0)
MCV: 94.1 fL (ref 80.0–100.0)
Platelets: 198 10*3/uL (ref 150–400)
RBC: 4.44 MIL/uL (ref 3.87–5.11)
RDW: 13.1 % (ref 11.5–15.5)
WBC: 7.5 10*3/uL (ref 4.0–10.5)
nRBC: 0 % (ref 0.0–0.2)

## 2018-11-19 LAB — BASIC METABOLIC PANEL
Anion gap: 9 (ref 5–15)
BUN: 29 mg/dL — ABNORMAL HIGH (ref 8–23)
CO2: 24 mmol/L (ref 22–32)
Calcium: 9.4 mg/dL (ref 8.9–10.3)
Chloride: 106 mmol/L (ref 98–111)
Creatinine, Ser: 1.31 mg/dL — ABNORMAL HIGH (ref 0.44–1.00)
GFR calc Af Amer: 42 mL/min — ABNORMAL LOW (ref >60)
GFR calc non Af Amer: 36 mL/min — ABNORMAL LOW (ref >60)
Glucose, Bld: 119 mg/dL — ABNORMAL HIGH (ref 70–99)
Potassium: 5.7 mmol/L — ABNORMAL HIGH (ref 3.5–5.1)
Sodium: 139 mmol/L (ref 135–145)

## 2018-11-19 LAB — LACTIC ACID, PLASMA: Lactic Acid, Venous: 1.2 mmol/L (ref 0.5–1.9)

## 2018-11-19 LAB — TROPONIN I: Troponin I: 0.07 ng/mL (ref <0.03)

## 2018-11-19 MED ORDER — SODIUM CHLORIDE 0.9% FLUSH: 3.0000 mL | Freq: Once | INTRAVENOUS | Status: DC

## 2018-11-19 MED ORDER — SODIUM CHLORIDE 0.9 % IV BOLUS
1000.0000 mL | Freq: Once | INTRAVENOUS | Status: AC
  Administered 2018-11-19: 1000 mL via INTRAVENOUS

## 2018-11-19 MED ORDER — DILTIAZEM HCL 100 MG IV SOLR
5.0000 mg/h | INTRAVENOUS | Status: DC
  Administered 2018-11-20: 2.5 mg/h via INTRAVENOUS
  Filled 2018-11-19: qty 100

## 2018-11-19 MED ORDER — ONDANSETRON HCL 4 MG/2ML IJ SOLN
4.0000 mg | Freq: Once | INTRAMUSCULAR | Status: AC
  Administered 2018-11-19: 4 mg via INTRAVENOUS
  Filled 2018-11-19: qty 2

## 2018-11-19 MED ORDER — DILTIAZEM HCL 25 MG/5ML IV SOLN
5.0000 mg | Freq: Once | INTRAVENOUS | Status: AC
  Administered 2018-11-19: 5 mg via INTRAVENOUS
  Filled 2018-11-19: qty 5

## 2018-11-19 NOTE — Telephone Encounter (Addendum)
I attempted to call Leah Hayden back for clarification on how the patient is currently feeling.  3 attempts to call back made- fast busy signal received each time.   Will check with Raquel Sarna - Device RN to see if any new downloads reveal anything for the patient.

## 2018-11-19 NOTE — Telephone Encounter (Signed)
I spoke with the patient's daughter in law, [name].  She states that the patient is currently still having some nausea and is now confused.  She got up to go to the bathroom and went the wrong direction.  Per [name], her pulse ox is showing a-fib w/ HR's around 65 pmp.  Confirmed the patient is still on xarelto and has not missed any doses.  [name] confirms the patient is currently on this.  No other symptoms identified.  I advised [name] that a-fib with controlled HR's aren't typically associated with confusion.  Stroke less likely though not completely impossible on xarelto.  I am not concerned about bradycardia as she has a PPM placed.   [name] is aware that I am waiting on feedback from [facility] about any new transmissions.  I have asked that she also reach out to the patient's PCP (Dr. [name]) in the interim just to ensure they have no concerns on their end- ? UTI. Per [name], the patient has not really been showing signs of a UTI.  I advised I will follow back up with her after I hear back from [place] about her device. [name] voices understanding and is agreeable.

## 2018-11-19 NOTE — Telephone Encounter (Signed)
I spoke with the patient's daughter in law, Leah Hayden, this morning. I inquired if the patient had had any more episodes of being woken up from sleep with symptoms of weakness/ nausea/ diarrhea since speaking with Raquel Sarna, Device RN on 11/12/18. Per Leah Hayden, the patient did have an episode this morning where she woke up ~ 7:30 am with nausea.  The sitter checked her BP/ HR- she was 120/69 (69).   The patient was supposed to see the podiatrist today to have some toenails clipped that are bothering her. They did call and cancel this appointment as the patient was not feeling up to going now. Per Leah Hayden, she wanted to cancel the appt anyway, but the patient had wanted to originally keep it as her toes were bothering her. I advised Leah Hayden this may be a blessing in disguise for her to not go out today due to the current COVID situation.  I have advised that the patient not go out if possible to decrease risk of exposure.   Leah Hayden is aware I will update Dr. Caryl Comes of the episode this morning and that I will call back if he has any further recommendations. Otherwise, I have advised Leah Hayden to please continue to monitor the patient's symptoms and call back if these worsen.   Leah Hayden voices understanding and is agreeable.

## 2018-11-19 NOTE — ED Triage Notes (Signed)
Pt to ED via EMS from home c/o weakness intermittently for some time but worse today, has demand pacemaker, denies pain.  EMS vitals 107/67 BP, 500cc NS given, 144 CBG, 120-160 BMP.  Pt states exertional SOB.  A&Ox4 chest rise even and unlabored at this time.

## 2018-11-19 NOTE — Telephone Encounter (Signed)
No new episodes noted via nightly PPM transmissions (monitor last updated 11/19/18 at [facility]). Any new data from today after 0148 will not be available for review until tomorrow.

## 2018-11-19 NOTE — ED Notes (Signed)
Interrogate pacemaker is completed.

## 2018-11-19 NOTE — ED Provider Notes (Addendum)
[facility] Emergency Department Provider Note   ____________________________________________   First MD Initiated Contact with Patient 11/19/18 2236     (approximate)  I have reviewed the triage vital signs and the nursing notes.   HISTORY  Chief Complaint Weakness   HPI Leah Hayden is a 85 y.o. female patient says she woke up 2 days ago feeling nauseated and just kind of weak.  This persisted today.  So she came in.  She has no fever.  She has no dysuria or chest pain.  She is not short of breath.  She just feels nauseated like she could vomited in a minute.  She reports her belly is just a little bit tender.  Again all the symptoms seem to have started 2 days ago.         Past Medical History:  Diagnosis Date  . Allergic rhinitis   . Asthma   . Biceps tendon tear    a. right->s/p surgical repair winter of 2014.  . Diverticulosis of colon   . GERD (gastroesophageal reflux disease)   . Hammer toe of right foot   . Hx of colonic polyps   . HX: breast cancer   . Meniere's disease   . NSTEMI (non-ST elevated myocardial infarction) ([place])    a. 11/2012 in setting of rapid afib;  b. 11/2012 Cath: nl EF, nonobs CAD->Med Rx;  b. 11/2012 Echo: EF 60-65%, mild LVH, mild MR/TR.  . Osteoarthritis   . PAF (paroxysmal atrial fibrillation) ([place])    a. Eliquis and amio initiated 11/2012 in setting of admission with RVR and NSTEMI;  b. Subsequently changed to flecainide and xarelto;  c. 11/2013 Echo: EF 60-65%, mild LVH, mod MR/TR.  Marland Kitchen Sinus brady-tachy syndrome ([place])    a. Post-conversion pause of 7.2 seconds during 11/2012 hospitalization @ [facility]; b. 04/2017 Event Monitor: No significant arrhythmias.    Patient Active Problem List   Diagnosis Date Noted  . Sinus node dysfunction ([place]) 09/20/2018  . Demand ischemia ([place])   . Elevated troponin 07/25/2018  . Mood disorder ([place]) 06/26/2018  . Chronic kidney disease (CKD), stage III (moderate) ([place]) 06/26/2018  .  Arrhythmia 04/12/2018  . Vitamin B12 deficiency 11/10/2015  . Vascular dementia, uncomplicated ([place]) 89/37/3428  . Advance directive discussed with patient 06/07/2015  . Hyperlipidemia 05/13/2014  . PAF (paroxysmal atrial fibrillation) ([place])   . Routine general medical examination at a health care facility 05/03/2012  . SVT/ PSVT/ PAT 01/10/2010  . Sick sinus syndrome ([place]) 01/10/2010  . Generalized osteoarthrosis, involving multiple sites 12/18/2007  . ALLERGIC RHINITIS 03/07/2007  . Mild intermittent asthma in adult without complication 76/81/1572  . GERD 03/07/2007  . DIVERTICULOSIS, COLON 03/07/2007  . BREAST CANCER, HX OF 03/07/2007    Past Surgical History:  Procedure Laterality Date  . ABDOMINAL HYSTERECTOMY    . CARDIAC CATHETERIZATION  [facility]   [facility]  . [place]   2nd to left toe  . ESOPHAGOGASTRODUODENOSCOPY  10/1999   inflammation only   . FOREIGN BODY REMOVAL Right 01/02/2013   Procedure: FOREIGN BODY REMOVAL RIGHT INDEX FINGER;  Surgeon: Cammie Sickle., MD;  Location: [place];  Service: Orthopedics;  Laterality: Right;  . HAMMER TOE SURGERY  11/15   Dr Milinda Pointer  . MASTECTOMY  1984   left   . MASTECTOMY  1985   right  . PACEMAKER IMPLANT N/A 09/20/2018   Procedure: PACEMAKER IMPLANT;  Surgeon: Deboraha Sprang, MD;  Location: [place] [facility];  Service: Cardiovascular;  Laterality: N/A;  . PARTIAL HYSTERECTOMY  1966   w/ bladder tack  . ROTATOR CUFF REPAIR  10/09   right   . TOE SURGERY  2001/2002   2nd/3rd,left     Prior to Admission medications   Medication Sig Start Date End Date Taking? Authorizing Provider  albuterol (PROVENTIL HFA) 108 (90 BASE) MCG/ACT inhaler Inhale 2 puffs into the lungs every 6 (six) hours as needed for wheezing or shortness of breath.     [provider]  cyanocobalamin 500 MCG tablet Take 500 mcg by mouth every other day.     [provider]  diltiazem (CARDIZEM) 30 MG tablet  Take 1 tablet (30 mg) by mouth every 6-8 hours as needed for breakthrough a-fib 04/19/18   Gollan, Kathlene November, MD  donepezil (ARICEPT) 5 MG tablet TAKE 1 TABLET (5 MG TOTAL) BY MOUTH AT BEDTIME. 02/08/18   Venia Carbon, MD  Fluticasone-Salmeterol (ADVAIR DISKUS) 100-50 MCG/DOSE AEPB Inhale 1 puff into the lungs daily as needed (shortness of breath or wheezing).     [provider]  hydroxypropyl methylcellulose / hypromellose (ISOPTO TEARS / GONIOVISC) 2.5 % ophthalmic solution Place 1 drop into both eyes 3 (three) times daily as needed for dry eyes.    [provider]  losartan (COZAAR) 25 MG tablet Take 2 tablets (50 mg total) by mouth daily. 08/17/18 09/30/18  Daune Perch, NP  montelukast (SINGULAIR) 10 MG tablet Take 10 mg by mouth every evening.     [provider]  Multiple Vitamins-Minerals (CENTRUM SILVER 50+WOMEN) TABS Take 1 tablet by mouth daily.    [provider]  nitroGLYCERIN (NITROSTAT) 0.4 MG SL tablet Place 1 tablet (0.4 mg total) under the tongue every 5 (five) minutes as needed for chest pain. 02/22/18   Minna Merritts, MD  pantoprazole (PROTONIX) 40 MG tablet Take 1 tablet (40 mg total) by mouth daily. 08/01/18   Dunn, Areta Haber, PA-C  Rivaroxaban (XARELTO) 15 MG TABS tablet Take 1 tablet (15 mg total) by mouth daily with supper. 08/02/18   Minna Merritts, MD  VITAMIN D PO Take 5,000 Units by mouth daily.     [provider]    Allergies Amiodarone; Eliquis [apixaban]; and Other  Family History  Problem Relation Age of Onset  . Heart failure Mother   . Gout Mother   . Heart disease Mother        heart failure  . Arthritis Mother   . Heart failure Father   . Heart disease Father        heart failure  . Diabetes Maternal Grandmother   . Colon cancer Maternal Grandfather   . Cancer Maternal Grandfather        colon  . Arthritis Brother     Social History Social History   Tobacco Use  . Smoking status: Never Smoker   . Smokeless tobacco: Never Used  Substance Use Topics  . Alcohol use: No    Alcohol/week: 0.0 standard drinks  . Drug use: No    Review of Systems  Constitutional: No fever/chills Eyes: No visual changes. ENT: No sore throat. Cardiovascular: Denies chest pain. Respiratory: Denies shortness of breath. Gastrointestinal: Mild abdominal pain.   nausea, no vomiting.  No diarrhea.  No constipation. Genitourinary: Negative for dysuria. Musculoskeletal: Negative for back pain. Skin: Negative for rash. Neurological: Negative for headaches, focal weakness   ____________________________________________   PHYSICAL EXAM:  VITAL SIGNS:  ED Triage Vitals  Enc Vitals Group     BP 11/19/18 2231 131/69     Pulse Rate 11/19/18 2231 (!) 53     Resp 11/19/18 2231 20     Temp 11/19/18 2231 98.6 F (37 C)     Temp Source 11/19/18 2231 Oral     SpO2 11/19/18 2231 100 %     Weight 11/19/18 2232 138 lb (62.6 kg)     Height 11/19/18 2232 5\' 4"  (1.626 m)     Head Circumference --      Peak Flow --      Pain Score 11/19/18 2232 0     Pain Loc --      Pain Edu? --      Excl. in [place]? --     Constitutional: Alert and oriented. Well appearing and in no acute distress. Eyes: Conjunctivae are normal.  Head: Atraumatic. Nose: No congestion/rhinnorhea. Mouth/Throat: Mucous membranes are moist.  Oropharynx non-erythematous. Neck: No stridor.   Cardiovascular: Rapid rate, regular rhythm. Grossly normal heart sounds.  Good peripheral circulation. Respiratory: Normal respiratory effort.  No retractions. Lungs CTAB. Gastrointestinal: Soft mild diffuse. No distention. No abdominal bruits. No CVA tenderness. Musculoskeletal: No lower extremity tenderness nor edema.   Neurologic:  Normal speech and language. No gross focal neurologic deficits are appreciated.  Skin:  Skin is warm, dry and intact. No rash noted. Psychiatric: Mood and affect are normal. Speech and behavior are normal.   ____________________________________________   LABS (all labs ordered are listed, but only abnormal results are displayed)  Labs Reviewed  BASIC METABOLIC PANEL - Abnormal; Notable for the following components:      Result Value   Potassium 5.7 (*)    Glucose, Bld 119 (*)    BUN 29 (*)    Creatinine, Ser 1.31 (*)    GFR calc non Af Amer 36 (*)    GFR calc Af Amer 42 (*)    All other components within normal limits  TROPONIN I - Abnormal; Notable for the following components:   Troponin I 0.07 (*)    All other components within normal limits  CBC  LACTIC ACID, PLASMA  URINALYSIS, COMPLETE (UACMP) WITH MICROSCOPIC  COMPREHENSIVE METABOLIC PANEL  BRAIN NATRIURETIC PEPTIDE  LACTIC ACID, PLASMA   ____________________________________________  EKG  EKG read interpreted by me shows a heart rate of 147.  The computer is reading dual AV pacing.  I do not see any pacing spikes.  Patient has new right bundle branch compared to February.  Patient also with inferior to 3 and half ST segment and T wave inversion.  Also T wave inversion in V2 3 and 4. ____________________________________________  RADIOLOGY  ED MD interpretation: X-ray read by radiology reviewed by me shows cardiomegaly only but stable.  Official radiology report(s): Dg Chest Portable 1 View  Result Date: 11/19/2018 CLINICAL DATA:  Weakness. EXAM: PORTABLE CHEST 1 VIEW COMPARISON:  09/21/2018 FINDINGS: Left-sided pacemaker in place with leads projecting over the right atrium and ventricle. Cardiomegaly is unchanged from prior exam. Unchanged mediastinal contours. No focal airspace disease, pulmonary edema, pleural effusion or pneumothorax. No acute osseous abnormalities are seen. IMPRESSION: Stable cardiomegaly without acute abnormality. Electronically Signed   By: Keith Rake M.D.   On: 11/19/2018 23:01    ____________________________________________   PROCEDURES  Procedure(s) performed (including Critical Care):  Critical care time 38 minutes.  This includes evaluating the patient initially and then following her carefully as we attempted to slow her heart rate  down with a diltiazem drip.  And then discussing her with the hospitalist.  Procedures   ____________________________________________   INITIAL IMPRESSION / ASSESSMENT AND PLAN / ED COURSE   Patient remains feeling slightly nauseated.  She is still in A. fib with RVR.  1 dose of diltiazem dropped her pressure slightly.  Heart rate came down as well.  Her pacer when it does fire seems to be firing rapidly as well.  The pacer interrogation is still pending.  We will start her on a diltiazem drip to try control the rate without dropping her blood pressure too much.  Troponin is 0.07 we will repeat that again and make sure is not going up too much.             ____________________________________________   FINAL CLINICAL IMPRESSION(S) / ED DIAGNOSES  Final diagnoses:  Weakness  Atrial fibrillation with RVR (HCC)  AKI (acute kidney injury) ([place])  Hypotension, unspecified hypotension type     ED Discharge Orders    None       Note:  This document was prepared using Dragon voice recognition software and may include unintentional dictation errors.    Nena Polio, MD 11/20/18 0009    Nena Polio, MD 11/28/18 [phone]

## 2018-11-19 NOTE — Telephone Encounter (Signed)
Patient daughter Leah Hayden calling to check on recommendations. Patient is still not feeling well.

## 2018-11-19 NOTE — ED Notes (Signed)
Dr. Cinda Quest made aware of pts trop of 0.07

## 2018-11-20 ENCOUNTER — Telehealth: Payer: Self-pay | Admitting: Internal Medicine

## 2018-11-20 ENCOUNTER — Other Ambulatory Visit: Payer: Self-pay

## 2018-11-20 ENCOUNTER — Encounter: Payer: Self-pay | Admitting: *Deleted

## 2018-11-20 DIAGNOSIS — G301 Alzheimer's disease with late onset: Secondary | ICD-10-CM | POA: Diagnosis not present

## 2018-11-20 DIAGNOSIS — I4891 Unspecified atrial fibrillation: Secondary | ICD-10-CM

## 2018-11-20 DIAGNOSIS — R531 Weakness: Secondary | ICD-10-CM | POA: Diagnosis not present

## 2018-11-20 DIAGNOSIS — I495 Sick sinus syndrome: Secondary | ICD-10-CM | POA: Diagnosis not present

## 2018-11-20 DIAGNOSIS — K219 Gastro-esophageal reflux disease without esophagitis: Secondary | ICD-10-CM | POA: Diagnosis not present

## 2018-11-20 DIAGNOSIS — I471 Supraventricular tachycardia: Secondary | ICD-10-CM | POA: Diagnosis not present

## 2018-11-20 DIAGNOSIS — N179 Acute kidney failure, unspecified: Secondary | ICD-10-CM | POA: Diagnosis present

## 2018-11-20 DIAGNOSIS — R7989 Other specified abnormal findings of blood chemistry: Secondary | ICD-10-CM | POA: Diagnosis not present

## 2018-11-20 DIAGNOSIS — N390 Urinary tract infection, site not specified: Secondary | ICD-10-CM | POA: Diagnosis not present

## 2018-11-20 DIAGNOSIS — F0280 Dementia in other diseases classified elsewhere without behavioral disturbance: Secondary | ICD-10-CM

## 2018-11-20 DIAGNOSIS — N189 Chronic kidney disease, unspecified: Secondary | ICD-10-CM | POA: Diagnosis present

## 2018-11-20 DIAGNOSIS — R2681 Unsteadiness on feet: Secondary | ICD-10-CM | POA: Diagnosis not present

## 2018-11-20 LAB — COMPREHENSIVE METABOLIC PANEL
ALT: 15 U/L (ref 0–44)
AST: 19 U/L (ref 15–41)
Albumin: 3.3 g/dL — ABNORMAL LOW (ref 3.5–5.0)
Alkaline Phosphatase: 59 U/L (ref 38–126)
Anion gap: 6 (ref 5–15)
BUN: 26 mg/dL — ABNORMAL HIGH (ref 8–23)
CO2: 26 mmol/L (ref 22–32)
Calcium: 8.6 mg/dL — ABNORMAL LOW (ref 8.9–10.3)
Chloride: 109 mmol/L (ref 98–111)
Creatinine, Ser: 1.22 mg/dL — ABNORMAL HIGH (ref 0.44–1.00)
GFR calc Af Amer: 46 mL/min — ABNORMAL LOW (ref >60)
GFR calc non Af Amer: 40 mL/min — ABNORMAL LOW (ref >60)
Glucose, Bld: 111 mg/dL — ABNORMAL HIGH (ref 70–99)
Potassium: 4.4 mmol/L (ref 3.5–5.1)
Sodium: 141 mmol/L (ref 135–145)
Total Bilirubin: 0.4 mg/dL (ref 0.3–1.2)
Total Protein: 5.5 g/dL — ABNORMAL LOW (ref 6.5–8.1)

## 2018-11-20 LAB — URINALYSIS, COMPLETE (UACMP) WITH MICROSCOPIC
Bilirubin Urine: NEGATIVE
Glucose, UA: NEGATIVE mg/dL
Hgb urine dipstick: NEGATIVE
Ketones, ur: NEGATIVE mg/dL
Nitrite: NEGATIVE
Protein, ur: NEGATIVE mg/dL
Specific Gravity, Urine: 1.006 (ref 1.005–1.030)
pH: 6 (ref 5.0–8.0)

## 2018-11-20 LAB — BASIC METABOLIC PANEL
Anion gap: 4 — ABNORMAL LOW (ref 5–15)
BUN: 25 mg/dL — ABNORMAL HIGH (ref 8–23)
CO2: 26 mmol/L (ref 22–32)
Calcium: 8.9 mg/dL (ref 8.9–10.3)
Chloride: 112 mmol/L — ABNORMAL HIGH (ref 98–111)
Creatinine, Ser: 1.05 mg/dL — ABNORMAL HIGH (ref 0.44–1.00)
GFR calc Af Amer: 55 mL/min — ABNORMAL LOW (ref >60)
GFR calc non Af Amer: 47 mL/min — ABNORMAL LOW (ref >60)
Glucose, Bld: 111 mg/dL — ABNORMAL HIGH (ref 70–99)
Potassium: 4.3 mmol/L (ref 3.5–5.1)
Sodium: 142 mmol/L (ref 135–145)

## 2018-11-20 LAB — TROPONIN I
Troponin I: 0.09 ng/mL (ref <0.03)
Troponin I: 0.07 ng/mL (ref <0.03)
Troponin I: 0.06 ng/mL (ref <0.03)

## 2018-11-20 LAB — CBC
HCT: 37.2 % (ref 36.0–46.0)
Hemoglobin: 12.1 g/dL (ref 12.0–15.0)
MCH: 30.6 pg (ref 26.0–34.0)
MCHC: 32.5 g/dL (ref 30.0–36.0)
MCV: 93.9 fL (ref 80.0–100.0)
Platelets: 201 10*3/uL (ref 150–400)
RBC: 3.96 MIL/uL (ref 3.87–5.11)
RDW: 13.2 % (ref 11.5–15.5)
WBC: 6.9 10*3/uL (ref 4.0–10.5)
nRBC: 0 % (ref 0.0–0.2)

## 2018-11-20 LAB — BRAIN NATRIURETIC PEPTIDE: B Natriuretic Peptide: 684 pg/mL — ABNORMAL HIGH (ref 0.0–100.0)

## 2018-11-20 MED ORDER — DILTIAZEM HCL 30 MG PO TABS: 30.0000 mg | ORAL_TABLET | Freq: Three times a day (TID) | ORAL | 0 refills | Status: DC

## 2018-11-20 MED ORDER — DILTIAZEM HCL 30 MG PO TABS
30.0000 mg | ORAL_TABLET | Freq: Three times a day (TID) | ORAL | Status: DC
  Administered 2018-11-20: 30 mg via ORAL
  Filled 2018-11-20: qty 1

## 2018-11-20 MED ORDER — MOMETASONE FURO-FORMOTEROL FUM 100-5 MCG/ACT IN AERO
2.0000 | INHALATION_SPRAY | Freq: Two times a day (BID) | RESPIRATORY_TRACT | Status: DC
  Administered 2018-11-20: 2 via RESPIRATORY_TRACT
  Filled 2018-11-20: qty 8.8

## 2018-11-20 MED ORDER — ACETAMINOPHEN 650 MG RE SUPP: 650.0000 mg | Freq: Four times a day (QID) | RECTAL | Status: DC | PRN

## 2018-11-20 MED ORDER — PANTOPRAZOLE SODIUM 40 MG PO TBEC
40.0000 mg | DELAYED_RELEASE_TABLET | Freq: Every day | ORAL | Status: DC
  Administered 2018-11-20: 40 mg via ORAL
  Filled 2018-11-20: qty 1

## 2018-11-20 MED ORDER — MONTELUKAST SODIUM 10 MG PO TABS: 10.0000 mg | ORAL_TABLET | Freq: Every evening | ORAL | Status: DC

## 2018-11-20 MED ORDER — ONDANSETRON HCL 4 MG PO TABS: 4.0000 mg | ORAL_TABLET | Freq: Four times a day (QID) | ORAL | Status: DC | PRN

## 2018-11-20 MED ORDER — ACETAMINOPHEN 325 MG PO TABS: 650.0000 mg | ORAL_TABLET | Freq: Four times a day (QID) | ORAL | Status: DC | PRN

## 2018-11-20 MED ORDER — RIVAROXABAN 15 MG PO TABS
15.0000 mg | ORAL_TABLET | Freq: Every day | ORAL | Status: DC
  Filled 2018-11-20: qty 1

## 2018-11-20 MED ORDER — ONDANSETRON HCL 4 MG/2ML IJ SOLN: 4.0000 mg | Freq: Four times a day (QID) | INTRAMUSCULAR | Status: DC | PRN

## 2018-11-20 MED ORDER — SODIUM CHLORIDE 0.9 % IV SOLN
INTRAVENOUS | Status: DC
  Administered 2018-11-20: 04:00:00 via INTRAVENOUS

## 2018-11-20 NOTE — Telephone Encounter (Signed)
We can try Doxy--don't want her to come into the office She has some dementia so someone will need to help

## 2018-11-20 NOTE — ED Notes (Signed)
ED TO INPATIENT HANDOFF REPORT  ED Nurse Name and Phone #: [phone]  S Name/Age/Gender Leah Hayden 85 y.o. female Room/Bed: ED10A/ED10A  Code Status   Code Status: Prior  Home/SNF/Other Home Patient oriented to: situation Is this baseline? Yes   Triage Complete: Triage complete  Chief Complaint A-fib/pacemaker  Triage Note Pt to ED via EMS from home c/o weakness intermittently for some time but worse today, has demand pacemaker, denies pain.  EMS vitals 107/67 BP, 500cc NS given, 144 CBG, 120-160 BMP.  Pt states exertional SOB.  A&Ox4 chest rise even and unlabored at this time.   Allergies Allergies  Allergen Reactions  . Amiodarone Other (See Comments)    Chest discomfort  . Eliquis [Apixaban] Rash  . Other Rash    oramycin    Level of Care/Admitting Diagnosis ED Disposition    ED Disposition Condition [place] [facility]: [place] [[id number]]  Level of Care: Telemetry [5]  Diagnosis: Atrial fibrillation with RVR [facility]) [[id number]]  Admitting Physician: Lance Coon [[id number]]  Attending Physician: Lance Coon [[id number]]  Bed request comments: 2a  PT Class (Do Not Modify): Observation [104]  PT Acc Code (Do Not Modify): Observation [10022]       B Medical/Surgery History Past Medical History:  Diagnosis Date  . Allergic rhinitis   . Asthma   . Biceps tendon tear    a. right->s/p surgical repair winter of 2014.  . Diverticulosis of colon   . GERD (gastroesophageal reflux disease)   . Hammer toe of right foot   . Hx of colonic polyps   . HX: breast cancer   . Meniere's disease   . NSTEMI (non-ST elevated myocardial infarction) ([place])    a. 11/2012 in setting of rapid afib;  b. 11/2012 Cath: nl EF, nonobs CAD->Med Rx;  b. 11/2012 Echo: EF 60-65%, mild LVH, mild MR/TR.  . Osteoarthritis   . PAF (paroxysmal atrial fibrillation) ([place])    a. Eliquis and amio initiated 11/2012 in setting of admission with RVR and NSTEMI;  b.  Subsequently changed to flecainide and xarelto;  c. 11/2013 Echo: EF 60-65%, mild LVH, mod MR/TR.  Marland Kitchen Sinus brady-tachy syndrome ([place])    a. Post-conversion pause of 7.2 seconds during 11/2012 hospitalization @ [facility]; b. 04/2017 Event Monitor: No significant arrhythmias.   Past Surgical History:  Procedure Laterality Date  . ABDOMINAL HYSTERECTOMY    . CARDIAC [facility]HETERIZATION  [facility]   [facility]  . [place]   2nd to left toe  . ESOPHAGOGASTRODUODENOSCOPY  10/1999   inflammation only   . FOREIGN BODY REMOVAL Right 01/02/2013   Procedure: FOREIGN BODY REMOVAL RIGHT INDEX FINGER;  Surgeon: Cammie Sickle., MD;  Location: [place];  Service: Orthopedics;  Laterality: Right;  . HAMMER TOE SURGERY  11/15   Dr Milinda Pointer  . MASTECTOMY  1984   left   . MASTECTOMY  1985   right  . PACEMAKER IMPLANT N/A 09/20/2018   Procedure: PACEMAKER IMPLANT;  Surgeon: Deboraha Sprang, MD;  Location: [place] [facility];  Service: Cardiovascular;  Laterality: N/A;  . PARTIAL HYSTERECTOMY  1966   w/ bladder tack  . ROTATOR CUFF REPAIR  10/09   right   . TOE SURGERY  2001/2002   2nd/3rd,left      A IV Location/Drains/Wounds Patient Lines/Drains/Airways Status   Active Line/Drains/Airways    Name:   Placement date:   Placement time:   Site:  Days:   Peripheral IV 09/20/18 Distal;Left;Posterior Forearm   09/20/18    1023    Forearm   61   Peripheral IV 11/19/18 Right Hand   11/19/18    2236    Hand   1   Peripheral IV 11/19/18 Right Antecubital   11/19/18    2239    Antecubital   1   Incision 01/02/13 Hand Right   01/02/13    0914     2148   Incision (Closed) 09/20/18 Chest Left;Upper   09/20/18    1320     61          Intake/Output Last 24 hours  Intake/Output Summary (Last 24 hours) at 11/20/2018 0141 Last data filed at 11/20/2018 0037 Gross per 24 hour  Intake 1000 ml  Output -  Net 1000 ml    Labs/Imaging Results for orders placed or performed during the hospital  encounter of 11/19/18 (from the past 48 hour(s))  Basic metabolic panel     Status: Abnormal   Collection Time: 11/19/18 10:35 PM  Result Value Ref Range   Sodium 139 135 - 145 mmol/L   Potassium 5.7 (H) 3.5 - 5.1 mmol/L    Comment: HEMOLYSIS AT THIS LEVEL MAY AFFECT RESULT   Chloride 106 98 - 111 mmol/L   CO2 24 22 - 32 mmol/L   Glucose, Bld 119 (H) 70 - 99 mg/dL   BUN 29 (H) 8 - 23 mg/dL   Creatinine, Ser 1.31 (H) 0.44 - 1.00 mg/dL   Calcium 9.4 8.9 - 10.3 mg/dL   GFR calc non Af Amer 36 (L) >60 mL/min   GFR calc Af Amer 42 (L) >60 mL/min   Anion gap 9 5 - 15    Comment: Performed at [facility], [address] [place], [place] [address]  CBC     Status: None   Collection Time: 11/19/18 10:35 PM  Result Value Ref Range   WBC 7.5 4.0 - 10.5 K/uL   RBC 4.44 3.87 - 5.11 MIL/uL   Hemoglobin 13.6 12.0 - 15.0 g/dL   HCT 41.8 36.0 - 46.0 %   MCV 94.1 80.0 - 100.0 fL   MCH 30.6 26.0 - 34.0 pg   MCHC 32.5 30.0 - 36.0 g/dL   RDW 13.1 11.5 - 15.5 %   Platelets 198 150 - 400 K/uL   nRBC 0.0 0.0 - 0.2 %    Comment: Performed at [facility], [address] [place], [place] [address]  Troponin I - ONCE - STAT     Status: Abnormal   Collection Time: 11/19/18 10:35 PM  Result Value Ref Range   Troponin I 0.07 (HH) <0.03 ng/mL    Comment: CRITICAL RESULT CALLED TO, READ BACK BY AND VERIFIED WITH BRITTANY SAMPSON AT 2315 11/19/2018 SMA Performed at [facility], [address] [place], [place] [address]   Lactic acid, plasma     Status: None   Collection Time: 11/19/18 10:41 PM  Result Value Ref Range   Lactic Acid, Venous 1.2 0.5 - 1.9 mmol/L    Comment: Performed at [facility], [address] [place], [place] [address]  Urinalysis, Complete w Microscopic     Status: Abnormal   Collection Time: 11/20/18 12:20 AM  Result Value Ref Range   Color, Urine STRAW (A) YELLOW   APPearance CLEAR (A) CLEAR   Specific Gravity, Urine 1.006 1.005 -  1.030   pH 6.0 5.0 - 8.0   Glucose,  UA NEGATIVE NEGATIVE mg/dL   Hgb urine dipstick NEGATIVE NEGATIVE   Bilirubin Urine NEGATIVE NEGATIVE   Ketones, ur NEGATIVE NEGATIVE mg/dL   Protein, ur NEGATIVE NEGATIVE mg/dL   Nitrite NEGATIVE NEGATIVE   Leukocytes,Ua MODERATE (A) NEGATIVE   WBC, UA 11-20 0 - 5 WBC/hpf   Bacteria, UA RARE (A) NONE SEEN   Squamous Epithelial / LPF 0-5 0 - 5    Comment: Performed at [facility], [address], [place], [place] [address]  Brain natriuretic peptide     Status: Abnormal   Collection Time: 11/20/18 12:20 AM  Result Value Ref Range   B Natriuretic Peptide 684.0 (H) 0.0 - 100.0 pg/mL    Comment: Performed at [facility], [address] [place], [place] [address]  Troponin I - Once     Status: Abnormal   Collection Time: 11/20/18 12:20 AM  Result Value Ref Range   Troponin I 0.09 (HH) <0.03 ng/mL    Comment: CRITICAL VALUE NOTED. VALUE IS CONSISTENT WITH PREVIOUSLY REPORTED/CALLED VALUE SMA Performed at [facility], [address] [place], [place] [address]    Dg Chest Portable 1 View  Result Date: 11/19/2018 CLINICAL DATA:  Weakness. EXAM: PORTABLE CHEST 1 VIEW COMPARISON:  09/21/2018 FINDINGS: Left-sided pacemaker in place with leads projecting over the right atrium and ventricle. Cardiomegaly is unchanged from prior exam. Unchanged mediastinal contours. No focal airspace disease, pulmonary edema, pleural effusion or pneumothorax. No acute osseous abnormalities are seen. IMPRESSION: Stable cardiomegaly without acute abnormality. Electronically Signed   By: Keith Rake M.D.   On: 11/19/2018 23:01    Pending Labs Unresulted Labs (From admission, onward)    Start     Ordered   11/19/18 2239  Comprehensive metabolic panel  Once,   STAT     11/19/18 2239   11/19/18 2239  Lactic acid, plasma  Now then every 2 hours,   STAT     11/19/18 2239   Signed and Held  Basic metabolic panel  Tomorrow morning,   R      Signed and Held   Signed and Held  CBC  Tomorrow morning,   R     Signed and Held   Signed and Held  Troponin I - Now Then Q6H  Now then every 6 hours,   R     Signed and Held          Vitals/Pain Today's Vitals   11/20/18 0100 11/20/18 0127 11/20/18 0130 11/20/18 0131  BP:    99/70  Pulse:  (!) 118 (!) 53 (!) 116  Resp:  16 (!) 21 (!) 21  Temp:      TempSrc:      SpO2:  94% 93% 100%  Weight:      Height:      PainSc: 0-No pain       Isolation Precautions No active isolations  Medications Medications  sodium chloride flush (NS) 0.9 % injection 3 mL (has no administration in time range)  diltiazem (CARDIZEM) 100 mg in dextrose 5 % 100 mL (1 mg/mL) infusion (7.5 mg/hr Intravenous Rate/Dose Change 11/20/18 0128)  sodium chloride 0.9 % bolus 1,000 mL (0 mLs Intravenous Stopped 11/20/18 0037)  ondansetron (ZOFRAN) injection 4 mg (4 mg Intravenous Given 11/19/18 2331)  diltiazem (CARDIZEM) injection 5 mg (5 mg Intravenous Given 11/19/18 2331)    Mobility walks with device Low fall risk   Focused Assessments Cardiac Assessment Handoff:  Cardiac Rhythm: Atrial fibrillation, Ventricular paced Lab  Results  Component Value Date   TROPONINI 0.09 ([place]) 11/20/2018   No results found for: DDIMER Does the Patient currently have chest pain? No     R Recommendations: See Admitting Provider Note  Report given to:   Additional Notes:

## 2018-11-20 NOTE — H&P (Signed)
[facility] [place] NAME: Leah Hayden    MR#:  [id number]  DATE OF BIRTH:  07/08/1928  DATE OF ADMISSION:  11/19/2018  PRIMARY CARE PHYSICIAN: Venia Carbon, MD   REQUESTING/REFERRING PHYSICIAN: Cinda Quest, MD  CHIEF COMPLAINT:   Chief Complaint  Patient presents with  . Weakness    HISTORY OF PRESENT ILLNESS:  Leah Hayden  is a 85 y.o. female who presents with chief complaint as above.  Patient presents the ED with a complaint of weakness and nausea.  She was found to be in A. fib with RVR here in the ED.  Other work-up is largely within normal limits except for mild elevation of her troponin.  She was treated for her A. fib with RVR in the ED, but required Cardizem drip.  Hospitalist were called for admission  PAST MEDICAL HISTORY:   Past Medical History:  Diagnosis Date  . Allergic rhinitis   . Asthma   . Biceps tendon tear    a. right->s/p surgical repair winter of 2014.  . Diverticulosis of colon   . GERD (gastroesophageal reflux disease)   . Hammer toe of right foot   . Hx of colonic polyps   . HX: breast cancer   . Meniere's disease   . NSTEMI (non-ST elevated myocardial infarction) ([place])    a. 11/2012 in setting of rapid afib;  b. 11/2012 Cath: nl EF, nonobs CAD->Med Rx;  b. 11/2012 Echo: EF 60-65%, mild LVH, mild MR/TR.  . Osteoarthritis   . PAF (paroxysmal atrial fibrillation) ([place])    a. Eliquis and amio initiated 11/2012 in setting of admission with RVR and NSTEMI;  b. Subsequently changed to flecainide and xarelto;  c. 11/2013 Echo: EF 60-65%, mild LVH, mod MR/TR.  Marland Kitchen Sinus brady-tachy syndrome ([place])    a. Post-conversion pause of 7.2 seconds during 11/2012 hospitalization @ [facility]; b. 04/2017 Event Monitor: No significant arrhythmias.     PAST SURGICAL HISTORY:   Past Surgical History:  Procedure Laterality Date  . ABDOMINAL HYSTERECTOMY    . CARDIAC CATHETERIZATION  11/2013   [facility]  . [place]   2nd to left toe  . ESOPHAGOGASTRODUODENOSCOPY  10/1999   inflammation only   . FOREIGN BODY REMOVAL Right 01/02/2013   Procedure: FOREIGN BODY REMOVAL RIGHT INDEX FINGER;  Surgeon: Cammie Sickle., MD;  Location: [facility];  Service: Orthopedics;  Laterality: Right;  . HAMMER TOE SURGERY  11/15   Dr Milinda Pointer  . MASTECTOMY  1984   left   . MASTECTOMY  1985   right  . PACEMAKER IMPLANT N/A 09/20/2018   Procedure: PACEMAKER IMPLANT;  Surgeon: Deboraha Sprang, MD;  Location: [facility];  Service: Cardiovascular;  Laterality: N/A;  . PARTIAL HYSTERECTOMY  1966   w/ bladder tack  . ROTATOR CUFF REPAIR  10/09   right   . TOE SURGERY  2001/2002   2nd/3rd,left      SOCIAL HISTORY:   Social History   Tobacco Use  . Smoking status: Never Smoker  . Smokeless tobacco: Never Used  Substance Use Topics  . Alcohol use: No    Alcohol/week: 0.0 standard drinks     FAMILY HISTORY:   Family History  Problem Relation Age of Onset  . Heart failure Mother   . Gout Mother   . Heart disease Mother        heart failure  . Arthritis Mother   .  Heart failure Father   . Heart disease Father        heart failure  . Diabetes Maternal Grandmother   . Colon cancer Maternal Grandfather   . Cancer Maternal Grandfather        colon  . Arthritis Brother      DRUG ALLERGIES:   Allergies  Allergen Reactions  . Amiodarone Other (See Comments)    Chest discomfort  . Eliquis [Apixaban] Rash  . Other Rash    oramycin    MEDICATIONS AT HOME:   Prior to Admission medications   Medication Sig Start Date End Date Taking? Authorizing Provider  albuterol (PROVENTIL HFA) 108 (90 BASE) MCG/ACT inhaler Inhale 2 puffs into the lungs every 6 (six) hours as needed for wheezing or shortness of breath.     [provider]  cyanocobalamin 500 MCG tablet Take 500 mcg by mouth every other day.     [provider]  diltiazem (CARDIZEM) 30 MG tablet Take 1  tablet (30 mg) by mouth every 6-8 hours as needed for breakthrough a-fib 04/19/18   Gollan, Kathlene November, MD  donepezil (ARICEPT) 5 MG tablet TAKE 1 TABLET (5 MG TOTAL) BY MOUTH AT BEDTIME. 02/08/18   Venia Carbon, MD  Fluticasone-Salmeterol (ADVAIR DISKUS) 100-50 MCG/DOSE AEPB Inhale 1 puff into the lungs daily as needed (shortness of breath or wheezing).     [provider]  hydroxypropyl methylcellulose / hypromellose (ISOPTO TEARS / GONIOVISC) 2.5 % ophthalmic solution Place 1 drop into both eyes 3 (three) times daily as needed for dry eyes.    [provider]  losartan (COZAAR) 25 MG tablet Take 2 tablets (50 mg total) by mouth daily. 08/17/18 09/30/18  Daune Perch, NP  montelukast (SINGULAIR) 10 MG tablet Take 10 mg by mouth every evening.     [provider]  Multiple Vitamins-Minerals (CENTRUM SILVER 50+WOMEN) TABS Take 1 tablet by mouth daily.    [provider]  nitroGLYCERIN (NITROSTAT) 0.4 MG SL tablet Place 1 tablet (0.4 mg total) under the tongue every 5 (five) minutes as needed for chest pain. 02/22/18   Minna Merritts, MD  pantoprazole (PROTONIX) 40 MG tablet Take 1 tablet (40 mg total) by mouth daily. 08/01/18   Dunn, Areta Haber, PA-C  Rivaroxaban (XARELTO) 15 MG TABS tablet Take 1 tablet (15 mg total) by mouth daily with supper. 08/02/18   Minna Merritts, MD  VITAMIN D PO Take 5,000 Units by mouth daily.     [provider]    REVIEW OF SYSTEMS:  Review of Systems  Constitutional: Positive for malaise/fatigue. Negative for chills, fever and weight loss.  HENT: Negative for ear pain, hearing loss and tinnitus.   Eyes: Negative for blurred vision, double vision, pain and redness.  Respiratory: Negative for cough, hemoptysis and shortness of breath.   Cardiovascular: Negative for chest pain, palpitations, orthopnea and leg swelling.  Gastrointestinal: Positive for nausea. Negative for abdominal pain, constipation, diarrhea and vomiting.   Genitourinary: Negative for dysuria, frequency and hematuria.  Musculoskeletal: Negative for back pain, joint pain and neck pain.  Skin:       No acne, rash, or lesions  Neurological: Negative for dizziness, tremors, focal weakness and weakness.  Endo/Heme/Allergies: Negative for polydipsia. Does not bruise/bleed easily.  Psychiatric/Behavioral: Negative for depression. The patient is not nervous/anxious and does not have insomnia.      VITAL SIGNS:   Vitals:   11/19/18 2300 11/19/18 2315 11/19/18 2330 11/19/18 2345  BP: Marland Kitchen)  91/54  (!) 107/92 117/75  Pulse: 94 (!) 37 (!) 114 94  Resp: (!) 23 (!) 24 (!) 34 (!) 23  Temp:      TempSrc:      SpO2: 96% 97% 99% 99%  Weight:      Height:       Wt Readings from Last 3 Encounters:  11/19/18 62.6 kg  09/23/18 63.3 kg  09/21/18 62.1 kg    PHYSICAL EXAMINATION:  Physical Exam  Vitals reviewed. Constitutional: She is oriented to person, place, and time. She appears well-developed and well-nourished. No distress.  HENT:  Head: Normocephalic and atraumatic.  Mouth/Throat: Oropharynx is clear and moist.  Eyes: Pupils are equal, round, and reactive to light. Conjunctivae and EOM are normal. No scleral icterus.  Neck: Normal range of motion. Neck supple. No JVD present. No thyromegaly present.  Cardiovascular: Intact distal pulses. Exam reveals no gallop and no friction rub.  No murmur heard. Tachycardic, irregular rhythm  Respiratory: Effort normal and breath sounds normal. No respiratory distress. She has no wheezes. She has no rales.  GI: Soft. Bowel sounds are normal. She exhibits no distension. There is no abdominal tenderness.  Musculoskeletal: Normal range of motion.        General: No edema.     Comments: No arthritis, no gout  Lymphadenopathy:    She has no cervical adenopathy.  Neurological: She is alert and oriented to person, place, and time. No cranial nerve deficit.  No dysarthria, no aphasia  Skin: Skin is warm and  dry. No rash noted. No erythema.  Psychiatric: She has a normal mood and affect. Her behavior is normal. Judgment and thought content normal.    LABORATORY PANEL:   CBC Recent Labs  Lab 11/19/18 2235  WBC 7.5  HGB 13.6  HCT 41.8  PLT 198   ------------------------------------------------------------------------------------------------------------------  Chemistries  Recent Labs  Lab 11/19/18 2235  NA 139  K 5.7*  CL 106  CO2 24  GLUCOSE 119*  BUN 29*  CREATININE 1.31*  CALCIUM 9.4   ------------------------------------------------------------------------------------------------------------------  Cardiac Enzymes Recent Labs  Lab 11/19/18 2235  TROPONINI 0.07*   ------------------------------------------------------------------------------------------------------------------  RADIOLOGY:  Dg Chest Portable 1 View  Result Date: 11/19/2018 CLINICAL DATA:  Weakness. EXAM: PORTABLE CHEST 1 VIEW COMPARISON:  09/21/2018 FINDINGS: Left-sided pacemaker in place with leads projecting over the right atrium and ventricle. Cardiomegaly is unchanged from prior exam. Unchanged mediastinal contours. No focal airspace disease, pulmonary edema, pleural effusion or pneumothorax. No acute osseous abnormalities are seen. IMPRESSION: Stable cardiomegaly without acute abnormality. Electronically Signed   By: Keith Rake M.D.   On: 11/19/2018 23:01    EKG:   Orders placed or performed during the hospital encounter of 11/19/18  . ED EKG  . ED EKG    IMPRESSION AND PLAN:  Principal Problem:   Atrial fibrillation with RVR (HCC) -blocking agents administered in the ED and the patient then required IV Cardizem drip.  We will continue this for now with hope for good rate control, and get a cardiology consult.  Continue anticoagulation Active Problems:   Acute on chronic renal failure (HCC) -gentle IV fluids tonight, avoid nephrotoxins   Elevated troponin -suspect this is due to demand  ischemia from her A. fib, we will trend her troponin tonight, get a cardiology consult   GERD -home dose PPI   Hyperlipidemia -home dose antilipid  Chart review performed and case discussed with ED provider. Labs, imaging and/or ECG reviewed by provider and discussed with  patient/family. Management plans discussed with the patient and/or family.  DVT PROPHYLAXIS: Systemic anticoagulation  GI PROPHYLAXIS:  PPI   ADMISSION STATUS: Observation  CODE STATUS: DNR Code Status History    Date Active Date Inactive Code Status Order ID Comments User Context   09/20/2018 1316 09/21/2018 1636 Full Code [id number]  Deboraha Sprang, MD Inpatient   07/25/2018 1442 07/27/2018 1457 DNR [id number]  Fritzi Mandes, MD Inpatient   04/12/2018 1634 04/15/2018 1725 DNR [id number]  Salary, Avel Peace, MD Inpatient    Advance Directive Documentation     Most Recent Value  Type of Advance Directive  Living will, Healthcare Power of Attorney, Out of facility DNR (pink MOST or yellow form)  Pre-existing out of facility DNR order (yellow form or pink MOST form)  Yellow form placed in chart (order not valid for inpatient use)  "MOST" Form in Place?  -      TOTAL TIME TAKING CARE OF THIS PATIENT: 40 minutes.   Ethlyn Daniels 11/20/2018, 12:32 AM  [facility]  Office  [phone]  CC: Primary care physician; Venia Carbon, MD  Note:  This document was prepared using Dragon voice recognition software and may include unintentional dictation errors.

## 2018-11-20 NOTE — ED Notes (Signed)
Spoke with Dr. Cinda Quest he stated to start Cardizem drip at 2.5mg /hr due to BP of 103/76

## 2018-11-20 NOTE — Consult Note (Signed)
Cardiology Consultation:   Patient ID: Leah Hayden MRN: [id number]; DOB: 1928-06-16  Admit date: 11/19/2018 Date of Consult: 11/20/2018  Primary Care Provider: Venia Carbon, MD Primary Cardiologist: Ida Rogue, MD  Physician requesting consult: Dr. Jannifer Franklin Reason for consult: Atrial fibrillation with RVR    Patient Profile:   Leah Hayden is a 85 y.o. female with a hx of NSTEMI 11/2013, nonobstructive CAD by [facility] 11/2013, PAF on Xarelto complicated by posttermination pauses, SVT, tachybradycardia syndrome, dementia, breast cancer, Mnire's disease, asthma, diverticulosis, and GERD who is being seen today for the evaluation of atrial fibrillation with RVR and elevated troponin at the request of Dr. Jannifer Franklin.  History of Present Illness:   Leah Hayden is an 85 year old female with PMH above and known Atrial fibrillation with tachybradycardia syndrome.   On 3/27 and 11/19/18 the patient's daughter in law called into the office noting the patient was not feeling well and her concern that the patient was in Afib. Device checks after both calls did not show Afib on the device (with last update 11/19/18 at 01:48). The daughter was noted also to be concerned about the patient's confusion. Later that evening, the daughter in law called EMS, and the patient was brought to the ED with c/o nausea, tender abdomen, SOB, and weakness x2 days. EMS vitals significant for BP 107/67 (NS 500cc given) and 120-160 BPM. Per daughter in law, no missed doses of Xarelto. In the ED, EKG showed pacing with underlying Afib and HR 147, new RBBB compared to 09/2018, and TWI. CXR negative for acute changes. She received 1 dose of diltiazem with a drop in her pressures and HR. She was started on a diltiazem drip with pacer interrogation was completed. Labs showed troponin 0.07.   Overnight converted to normal sinus rhythm 6:40 AM Reports that she feels well with no significant shortness of breath Confusion as to where she has  come from, where she is living, where she is   Prior history reviewed in detail She was intially diagnosed with atrial fibrillation ~ 2012 and managed with amiodarone and Eliquis.  In 11/2013, she was admitted for NSTEMI and noted to have 7.2-second post-termination pauses.  She was changed to flecainide and Xarelto. In 2018, event monitoring did not show significant arrhythmias.  In 03/2018, she was admitted for diarrhea and bradycardia, developing A. fib with RVR during admission, and later converted back to SB after a 5.9-second pause.  EF 60 -65%.  It was recommended she hold her flecainide and pursue outpatient cardiac monitoring, which was not completed.  In hospital follow-up 04/2018, she started diltiazem 30 mg as needed for rates over 90 bpm.    She was admitted 12/12 -07/27/2018 for palpitations and weakness with rates in the 50-90s bpm at home. In the ED, she was in NSR with HR 60bpm. Telemetry showed 2.55 second pauses, and it was recommended she take her diltiazem 30mg  prn for HR >70bpm, as her heart rate was typically in the 50s bpm. At hospital follow-up, her Xarelto was decreased to 15mg  daily due to decreased renal function. It was noted that the patient and her daughter in law were finding it very difficult to manage her Afib with rate control in the setting of her tachybradycardia syndrome and post-termination pauses and wished to see EP.   She was seen by Dr. Caryl Comes of EP 09/19/2018 with notes indicating that pacing was indicated and pacemaker implant 09/20/2018 due to prolonged pauses with risk for syncope, as well as  unknown burden of Afib. She underwent successful St. Jude dual chamber pacemaker insertion by Dr. Lovena Le on 2/7 and tolerated the procedure well without post-procedure complication.      Past Medical History:  Diagnosis Date  . Allergic rhinitis   . Asthma   . Biceps tendon tear    a. right->s/p surgical repair winter of 2014.  . Diverticulosis of colon   . GERD  (gastroesophageal reflux disease)   . Hammer toe of right foot   . Hx of colonic polyps   . HX: breast cancer   . Meniere's disease   . NSTEMI (non-ST elevated myocardial infarction) ([place])    a. 11/2012 in setting of rapid afib;  b. 11/2012 Cath: nl EF, nonobs CAD->Med Rx;  b. 11/2012 Echo: EF 60-65%, mild LVH, mild MR/TR.  . Osteoarthritis   . PAF (paroxysmal atrial fibrillation) ([place])    a. Eliquis and amio initiated 11/2012 in setting of admission with RVR and NSTEMI;  b. Subsequently changed to flecainide and xarelto;  c. 11/2013 Echo: EF 60-65%, mild LVH, mod MR/TR.  Marland Kitchen Sinus brady-tachy syndrome ([place])    a. Post-conversion pause of 7.2 seconds during 11/2012 hospitalization @ [facility]; b. 04/2017 Event Monitor: No significant arrhythmias.    Past Surgical History:  Procedure Laterality Date  . ABDOMINAL HYSTERECTOMY    . CARDIAC CATHETERIZATION  11/2013   [facility]  . [place]   2nd to left toe  . ESOPHAGOGASTRODUODENOSCOPY  10/1999   inflammation only   . FOREIGN BODY REMOVAL Right 01/02/2013   Procedure: FOREIGN BODY REMOVAL RIGHT INDEX FINGER;  Surgeon: Cammie Sickle., MD;  Location: [place];  Service: Orthopedics;  Laterality: Right;  . HAMMER TOE SURGERY  11/15   Dr Milinda Pointer  . MASTECTOMY  1984   left   . MASTECTOMY  1985   right  . PACEMAKER IMPLANT N/A 09/20/2018   Procedure: PACEMAKER IMPLANT;  Surgeon: Deboraha Sprang, MD;  Location: [place] [facility];  Service: Cardiovascular;  Laterality: N/A;  . PARTIAL HYSTERECTOMY  1966   w/ bladder tack  . ROTATOR CUFF REPAIR  10/09   right   . TOE SURGERY  2001/2002   2nd/3rd,left      Home Medications:  Prior to Admission medications   Medication Sig Start Date End Date Taking? Authorizing Provider  albuterol (PROVENTIL HFA) 108 (90 BASE) MCG/ACT inhaler Inhale 2 puffs into the lungs every 6 (six) hours as needed for wheezing or shortness of breath.     [provider]  cyanocobalamin 500  MCG tablet Take 500 mcg by mouth every other day.     [provider]  diltiazem (CARDIZEM) 30 MG tablet Take 1 tablet (30 mg) by mouth every 6-8 hours as needed for breakthrough a-fib 04/19/18   Gollan, Kathlene November, MD  donepezil (ARICEPT) 5 MG tablet TAKE 1 TABLET (5 MG TOTAL) BY MOUTH AT BEDTIME. 02/08/18   Venia Carbon, MD  Fluticasone-Salmeterol (ADVAIR DISKUS) 100-50 MCG/DOSE AEPB Inhale 1 puff into the lungs daily as needed (shortness of breath or wheezing).     [provider]  hydroxypropyl methylcellulose / hypromellose (ISOPTO TEARS / GONIOVISC) 2.5 % ophthalmic solution Place 1 drop into both eyes 3 (three) times daily as needed for dry eyes.    [provider]  losartan (COZAAR) 25 MG tablet Take 2 tablets (50 mg total) by mouth daily. 08/17/18 09/30/18  Daune Perch, NP  montelukast (SINGULAIR) 10 MG tablet  Take 10 mg by mouth every evening.     [provider]  Multiple Vitamins-Minerals (CENTRUM SILVER 50+WOMEN) TABS Take 1 tablet by mouth daily.    [provider]  nitroGLYCERIN (NITROSTAT) 0.4 MG SL tablet Place 1 tablet (0.4 mg total) under the tongue every 5 (five) minutes as needed for chest pain. 02/22/18   Minna Merritts, MD  pantoprazole (PROTONIX) 40 MG tablet Take 1 tablet (40 mg total) by mouth daily. 08/01/18   Dunn, Areta Haber, PA-C  Rivaroxaban (XARELTO) 15 MG TABS tablet Take 1 tablet (15 mg total) by mouth daily with supper. 08/02/18   Minna Merritts, MD  VITAMIN D PO Take 5,000 Units by mouth daily.     [provider]    Inpatient Medications: Scheduled Meds: . mometasone-formoterol  2 puff Inhalation BID  . montelukast  10 mg Oral QPM  . pantoprazole  40 mg Oral Daily  . Rivaroxaban  15 mg Oral Q supper   Continuous Infusions: . sodium chloride 75 mL/hr at 11/20/18 0345  . diltiazem (CARDIZEM) infusion 5 mg/hr (11/20/18 0345)   PRN Meds: acetaminophen **OR** acetaminophen, ondansetron **OR**  ondansetron (ZOFRAN) IV  Allergies:    Allergies  Allergen Reactions  . Amiodarone Other (See Comments)    Chest discomfort  . Eliquis [Apixaban] Rash  . Other Rash    oramycin    Social History:   Social History   Socioeconomic History  . Marital status: Widowed    Spouse name: Not on file  . Number of children: 2  . Years of education: Not on file  . Highest education level: Not on file  Occupational History  . 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 Financial resource strain: Not on file  . Food insecurity:    Worry: Not on file    Inability: Not on file  . Transportation needs:    Medical: Not on file    Non-medical: Not on file  Tobacco Use  . Smoking status: Never Smoker  . Smokeless tobacco: Never Used  Substance and Sexual Activity  . Alcohol use: No    Alcohol/week: 0.0 standard drinks  . Drug use: No  . Sexual activity: Not on file  Lifestyle  . Physical activity:    Days per week: Not on file    Minutes per session: Not on file  . Stress: Not on file  Relationships  . Social connections:    Talks on phone: Not on file    Gets together: Not on file    Attends religious service: Not on file    Active member of club or organization: Not on file    Attends meetings of clubs or organizations: Not on file    Relationship status: Not on file  . Intimate partner violence:    Fear of current or ex partner: Not on file    Emotionally abused: Not on file    Physically abused: Not on file    Forced sexual activity: Not on file  Other Topics Concern  . Not on file  Social History Narrative   Now has living will   Sons are health care POAs   Has DNR   No tube feeds if cognitively unaware    Family History:    Family History  Problem Relation Age of Onset  . Heart failure Mother   . Gout Mother   . Heart disease Mother  heart failure  . Arthritis Mother   . Heart failure Father   . Heart disease  Father        heart failure  . Diabetes Maternal Grandmother   . Colon cancer Maternal Grandfather   . Cancer Maternal Grandfather        colon  . Arthritis Brother      ROS:  Please see the history of present illness.   Review of Systems  Constitutional: Negative.   Respiratory: Negative.   Cardiovascular: Negative.  Negative for chest pain.  Gastrointestinal: Negative.  Negative for vomiting.  Musculoskeletal: Negative.   Neurological: Positive for weakness.  Psychiatric/Behavioral: Negative.   All other systems reviewed and are negative.      Physical Exam/Data:   Vitals:   11/20/18 0530 11/20/18 0601 11/20/18 0714 11/20/18 0752  BP: 100/61 (!) 96/50 128/61 (!) 144/62  Pulse: 84 82 80 73  Resp:    18  Temp:    98.3 F (36.8 C)  TempSrc:    Oral  SpO2:    99%  Weight:      Height:        Intake/Output Summary (Last 24 hours) at 11/20/2018 0905 Last data filed at 11/20/2018 0345 Gross per 24 hour  Intake 1021.42 ml  Output -  Net 1021.42 ml   Filed Weights   11/19/18 2232 11/20/18 0251  Weight: 62.6 kg 65.5 kg   Body mass index is 24.79 kg/m.   Physical exam Constitutional:  oriented to person, place, and time. No distress.  HENT:  Head: Normocephalic and atraumatic.  Eyes:  no discharge. No scleral icterus.  Neck: Normal range of motion. Neck supple. No JVD present.  Cardiovascular: Normal rate, regular rhythm, normal heart sounds and intact distal pulses. Exam reveals no gallop and no friction rub. No edema No murmur heard. Pulmonary/Chest: Effort normal and breath sounds normal. No stridor. No respiratory distress.  no wheezes.  no rales.  no tenderness.  Abdominal: Soft.  no distension.  no tenderness.  Musculoskeletal: Normal range of motion.  no  tenderness or deformity.  Neurological:  normal muscle tone. Coordination normal. No atrophy Skin: Skin is warm and dry. No rash noted. not diaphoretic.  Psychiatric:  normal mood and affect.  Poor memory     EKG: Shows Atrial fibrillation LVH, personally reviewed by myself Telemetry:  Telemetry was personally reviewed and demonstrates: Sinus rhythm, atrial paced   CV Studies:   Relevant CV Studies: 03/2018 TTE Study Conclusions - Left ventricle: The cavity size was normal. There was mild   concentric hypertrophy. Systolic function was normal. The   estimated ejection fraction was in the range of 60% to 65%. Wall   motion was normal; there were no regional wall motion   abnormalities. Doppler parameters are consistent with abnormal   left ventricular relaxation (grade 1 diastolic dysfunction). - Left atrium: The atrium was mildly dilated. - Right ventricle: Systolic function was normal. - Pulmonary arteries: Systolic pressure was within the normal   range. - Pericardium, extracardiac: A trivial pericardial effusion was   identified.  Laboratory Data:  Chemistry Recent Labs  Lab 11/19/18 2235 11/20/18 0020 11/20/18 0632  NA 139 141 142  K 5.7* 4.4 4.3  CL 106 109 112*  CO2 24 26 26   GLUCOSE 119* 111* 111*  BUN 29* 26* 25*  CREATININE 1.31* 1.22* 1.05*  CALCIUM 9.4 8.6* 8.9  GFRNONAA 36* 40* 47*  GFRAA 42* 46* 55*  ANIONGAP 9 6 4*  Recent Labs  Lab 11/20/18 0020  PROT 5.5*  ALBUMIN 3.3*  AST 19  ALT 15  ALKPHOS 59  BILITOT 0.4   Hematology Recent Labs  Lab 11/19/18 2235 11/20/18 0632  WBC 7.5 6.9  RBC 4.44 3.96  HGB 13.6 12.1  HCT 41.8 37.2  MCV 94.1 93.9  MCH 30.6 30.6  MCHC 32.5 32.5  RDW 13.1 13.2  PLT 198 201   Cardiac Enzymes Recent Labs  Lab 11/19/18 2235 11/20/18 0020 11/20/18 0632  TROPONINI 0.07* 0.09* 0.07*   No results for input(s): TROPIPOC in the last 168 hours.  BNP Recent Labs  Lab 11/20/18 0020  BNP 684.0*    DDimer No results for input(s): DDIMER in the last 168 hours.  Radiology/Studies:  Dg Chest Portable 1 View  Result Date: 11/19/2018 CLINICAL DATA:  Weakness. EXAM: PORTABLE CHEST 1 VIEW COMPARISON:  09/21/2018  FINDINGS: Left-sided pacemaker in place with leads projecting over the right atrium and ventricle. Cardiomegaly is unchanged from prior exam. Unchanged mediastinal contours. No focal airspace disease, pulmonary edema, pleural effusion or pneumothorax. No acute osseous abnormalities are seen. IMPRESSION: Stable cardiomegaly without acute abnormality. Electronically Signed   By: Keith Rake M.D.   On: 11/19/2018 23:01    Assessment and Plan:   PAF (paroxysmal atrial fibrillation) ([place]) - Plan: EKG 12-Lead Flecainide Previously held for sick sinus syndrome and symptomatic bradycardia She appears to have done well and converted with low-dose diltiazem infusion -Recommend we start diltiazem 30 mg every 8 hours with hold parameters, hold for heart rate less than 60 -Continue anticoagulation  SVT/ PSVT/ PAT Denies having any significant arrhythmia,  -Low-dose diltiazem as above  Atherosclerosis of native coronary artery of native heart with angina pectoris  (HCC) Previous chest tightness, none recently Previous cardiac catheterization with no significant disease -No further ischemic work-up needed at this time  Memory loss:  Managed by primary care,  Family involved Moderate cognitive decline Essentially has lost her independence Was high functioning until 2 years ago    Total encounter time more than 110 minutes  Greater than 50% was spent in counseling and coordination of care with the patient   For questions or updates, please contact [facility] Please consult [email or web address] for contact info under   Signed, Esmond Plants, MD, Ph.D [facility]

## 2018-11-20 NOTE — Plan of Care (Signed)
  Problem: Nutrition: Goal: Adequate nutrition will be maintained Outcome: Progressing   Problem: Coping: Goal: Level of anxiety will decrease Outcome: Progressing   Problem: Pain Managment: Goal: General experience of comfort will improve Outcome: Progressing Note:  No complaints of pain this shift   Problem: Safety: Goal: Ability to remain free from injury will improve Outcome: Progressing   Problem: Skin Integrity: Goal: Risk for impaired skin integrity will decrease Outcome: Progressing   Problem: Cardiac: Goal: Ability to achieve and maintain adequate cardiopulmonary perfusion will improve Outcome: Progressing Note:  Still in afib but rate now controlled, on cardizem gtt @ 5m/hr   Problem: Education: Goal: Knowledge of General Education information will improve Description Including pain rating scale, medication(s)/side effects and non-pharmacologic comfort measures Outcome: Completed/Met

## 2018-11-20 NOTE — Discharge Summary (Signed)
[facility] [place] NAME: Leah Hayden    MR#:  [id number]  DATE OF BIRTH:  09-04-1927  DATE OF ADMISSION:  11/19/2018 ADMITTING PHYSICIAN: Lance Coon, MD  DATE OF DISCHARGE: 11/20/2018  PRIMARY CARE PHYSICIAN: Venia Carbon, MD   ADMISSION DIAGNOSIS:  Weakness [R53.1] AKI (acute kidney injury) ([place]) [N17.9] Atrial fibrillation with RVR (HCC) [I48.91] Hypotension, unspecified hypotension type [I95.9]  DISCHARGE DIAGNOSIS:  Principal Problem:   Atrial fibrillation with RVR ([place]) Active Problems:   GERD   Hyperlipidemia   Elevated troponin   Acute on chronic renal failure ([place])   SECONDARY DIAGNOSIS:   Past Medical History:  Diagnosis Date  . Allergic rhinitis   . Asthma   . Biceps tendon tear    a. right->s/p surgical repair winter of 2014.  . Diverticulosis of colon   . GERD (gastroesophageal reflux disease)   . Hammer toe of right foot   . Hx of colonic polyps   . HX: breast cancer   . Meniere's disease   . NSTEMI (non-ST elevated myocardial infarction) ([place])    a. 11/2012 in setting of rapid afib;  b. 11/2012 Cath: nl EF, nonobs CAD->Med Rx;  b. 11/2012 Echo: EF 60-65%, mild LVH, mild MR/TR.  . Osteoarthritis   . PAF (paroxysmal atrial fibrillation) ([place])    a. Eliquis and amio initiated 11/2012 in setting of admission with RVR and NSTEMI;  b. Subsequently changed to flecainide and xarelto;  c. 11/2013 Echo: EF 60-65%, mild LVH, mod MR/TR.  Marland Kitchen Sinus brady-tachy syndrome ([place])    a. Post-conversion pause of 7.2 seconds during 11/2012 hospitalization @ [facility]; b. 04/2017 Event Monitor: No significant arrhythmias.     ADMITTING HISTORY Leah Hayden  is a 85 y.o. female who presents with chief complaint as above.  Patient presents the ED with a complaint of weakness and nausea.  She was found to be in A. fib with RVR here in the ED.  Other work-up is largely within normal limits except for mild elevation of her troponin.  She was  treated for her A. fib with RVR in the ED, but required Cardizem drip.  Hospitalist were called for admission   HOSPITAL COURSE:  Patient converted to sinus rhythm this morning spontaneously.  She was switched to oral Cardizem 30 mg every 8 hourly.  Patient was evaluated by cardiology.  No acute intervention recommended.  Mildly elevated troponin secondary to demand ischemia.  Blood pressure has been more stable.  No complaints of any chest pain.  Patient will be discharged home follow-up with cardiology in the clinic.  Cardiology advised patient to be discharged home.  CONSULTS OBTAINED:  Treatment Team:  Minna Merritts, MD  DRUG ALLERGIES:   Allergies  Allergen Reactions  . Amiodarone Other (See Comments)    Chest discomfort  . Eliquis [Apixaban] Rash  . Other Rash    oramycin    DISCHARGE MEDICATIONS:   Allergies as of 11/20/2018      Reactions   Amiodarone Other (See Comments)   Chest discomfort   Eliquis [apixaban] Rash   Other Rash   oramycin      Medication List    TAKE these medications   Advair Diskus 100-50 MCG/DOSE Aepb Generic drug:  Fluticasone-Salmeterol Inhale 1 puff into the lungs daily as needed (shortness of breath or wheezing).   Centrum Silver 50+Women Tabs Take 1 tablet by mouth daily.   diltiazem 30 MG tablet Commonly known as:  CARDIZEM Take 1 tablet (30 mg total) by mouth every 8 (eight) hours for 30 days. What changed:    how much to take  how to take this  when to take this  additional instructions   donepezil 5 MG tablet Commonly known as:  ARICEPT TAKE 1 TABLET (5 MG TOTAL) BY MOUTH AT BEDTIME.   hydroxypropyl methylcellulose / hypromellose 2.5 % ophthalmic solution Commonly known as:  ISOPTO TEARS / GONIOVISC Place 1 drop into both eyes 3 (three) times daily as needed for dry eyes.   losartan 25 MG tablet Commonly known as:  COZAAR Take 2 tablets (50 mg total) by mouth daily.   montelukast 10 MG tablet Commonly known  as:  SINGULAIR Take 10 mg by mouth every evening.   nitroGLYCERIN 0.4 MG SL tablet Commonly known as:  NITROSTAT Place 1 tablet (0.4 mg total) under the tongue every 5 (five) minutes as needed for chest pain.   pantoprazole 40 MG tablet Commonly known as:  PROTONIX Take 1 tablet (40 mg total) by mouth daily.   Proventil HFA 108 (90 Base) MCG/ACT inhaler Generic drug:  albuterol Inhale 2 puffs into the lungs every 6 (six) hours as needed for wheezing or shortness of breath.   Rivaroxaban 15 MG Tabs tablet Commonly known as:  XARELTO Take 1 tablet (15 mg total) by mouth daily with supper.   vitamin B-12 500 MCG tablet Commonly known as:  CYANOCOBALAMIN Take 500 mcg by mouth every other day.   VITAMIN D PO Take 5,000 Units by mouth daily.       Today  Patient seen today No complaints of any chest pain No palpitations Feels better Hemodynamically stable VITAL SIGNS:  Blood pressure (!) 144/62, pulse 73, temperature 98.3 F (36.8 C), temperature source Oral, resp. rate 18, height 5\' 4"  (1.626 m), weight 65.5 kg, SpO2 99 %.  I/O:    Intake/Output Summary (Last 24 hours) at 11/20/2018 1348 Last data filed at 11/20/2018 1347 Gross per 24 hour  Intake 1981.42 ml  Output 900 ml  Net 1081.42 ml    PHYSICAL EXAMINATION:  Physical Exam  GENERAL:  85 y.o.-year-old patient lying in the bed with no acute distress.  LUNGS: Normal breath sounds bilaterally, no wheezing, rales,rhonchi or crepitation. No use of accessory muscles of respiration.  CARDIOVASCULAR: S1, S2 normal. No murmurs, rubs, or gallops.  ABDOMEN: Soft, non-tender, non-distended. Bowel sounds present. No organomegaly or mass.  NEUROLOGIC: Moves all 4 extremities. PSYCHIATRIC: The patient is alert and oriented x 3.  SKIN: No obvious rash, lesion, or ulcer.   DATA REVIEW:   CBC Recent Labs  Lab 11/20/18 0632  WBC 6.9  HGB 12.1  HCT 37.2  PLT 201    Chemistries  Recent Labs  Lab 11/20/18 0020  11/20/18 0632  NA 141 142  K 4.4 4.3  CL 109 112*  CO2 26 26  GLUCOSE 111* 111*  BUN 26* 25*  CREATININE 1.22* 1.05*  CALCIUM 8.6* 8.9  AST 19  --   ALT 15  --   ALKPHOS 59  --   BILITOT 0.4  --     Cardiac Enzymes Recent Labs  Lab 11/20/18 1216  TROPONINI 0.06*    Microbiology Results  Results for orders placed or performed in visit on 09/23/18  Urine Culture     Status: None   Collection Time: 09/23/18  2:08 PM  Result Value Ref Range Status   MICRO NUMBER: [id number]  Final   SPECIMEN QUALITY: Adequate  Final   Sample Source URINE  Final   STATUS: FINAL  Final   ISOLATE 1:   Final    Single organism less than 10,000 CFU/mL isolated. These organisms, commonly found on external and internal genitalia, are considered colonizers. No further testing performed.    RADIOLOGY:  Dg Chest Portable 1 View  Result Date: 11/19/2018 CLINICAL DATA:  Weakness. EXAM: PORTABLE CHEST 1 VIEW COMPARISON:  09/21/2018 FINDINGS: Left-sided pacemaker in place with leads projecting over the right atrium and ventricle. Cardiomegaly is unchanged from prior exam. Unchanged mediastinal contours. No focal airspace disease, pulmonary edema, pleural effusion or pneumothorax. No acute osseous abnormalities are seen. IMPRESSION: Stable cardiomegaly without acute abnormality. Electronically Signed   By: Keith Rake M.D.   On: 11/19/2018 23:01    Follow up with PCP in 1 week.  Management plans discussed with the patient, family and they are in agreement.  CODE STATUS: DNR    Code Status Orders  (From admission, onward)         Start     Ordered   11/20/18 0251  Do not attempt resuscitation (DNR)  Continuous    Question Answer Comment  In the event of cardiac or respiratory ARREST Do not call a "code blue"   In the event of cardiac or respiratory ARREST Do not perform Intubation, CPR, defibrillation or ACLS   In the event of cardiac or respiratory ARREST Use medication by any route,  position, wound care, and other measures to relive pain and suffering. May use oxygen, suction and manual treatment of airway obstruction as needed for comfort.      11/20/18 0250        Code Status History    Date Active Date Inactive Code Status Order ID Comments User Context   09/20/2018 1316 09/21/2018 1636 Full Code [id number]  Deboraha Sprang, MD Inpatient   07/25/2018 1442 07/27/2018 1457 DNR [id number]  Fritzi Mandes, MD Inpatient   04/12/2018 1634 04/15/2018 1725 DNR [id number]  Salary, Avel Peace, MD Inpatient    Advance Directive Documentation     Most Recent Value  Type of Advance Directive  Living will, Healthcare Power of Attorney, Out of facility DNR (pink MOST or yellow form)  Pre-existing out of facility DNR order (yellow form or pink MOST form)  Yellow form placed in chart (order not valid for inpatient use)  "MOST" Form in Place?  -      TOTAL TIME TAKING CARE OF THIS PATIENT ON DAY OF DISCHARGE: more than 35 minutes.   Saundra Shelling M.D on 11/20/2018 at 1:48 PM  Between 7am to 6pm - Pager - [phone]  After 6pm go to [email or web address] - password [email or web address] [facility]  Office  [phone]  CC: Primary care physician; Venia Carbon, MD  Note: This dictation was prepared with Dragon dictation along with smaller phrase technology. Any transcriptional errors that result from this process are unintentional.

## 2018-11-20 NOTE — Telephone Encounter (Signed)
(  Reminder for myself) Check the schedule, pt may need to be moved.

## 2018-11-20 NOTE — Progress Notes (Signed)
Patient discharged home via son Maudie Mercury. Discharge directions and instructions given to Maudie Mercury, along with prescription and followup information. VSS.

## 2018-11-20 NOTE — Progress Notes (Signed)
This RN on Spoke with son, Maudie Mercury this morning regarding his mothers medical status, encouraged him to inform other family members and keep them posted regarding patient status. Informed him that doctor will round on patient later today with updates.

## 2018-11-20 NOTE — Telephone Encounter (Signed)
Kay at [facility] called to set up [place] for patient. I scheduled for Tues, 4/14 at 10:30- does pt need in office or doxyme visit?

## 2018-11-21 ENCOUNTER — Telehealth: Payer: Self-pay

## 2018-11-21 ENCOUNTER — Telehealth: Payer: Self-pay | Admitting: Internal Medicine

## 2018-11-21 NOTE — Telephone Encounter (Signed)
I have left a msg for Leah Hayden per DPR, if she calls back please let her know this is not IN OFFICE, this is virtual.

## 2018-11-21 NOTE — Telephone Encounter (Signed)
Late entry- I spoke with Dr. Caryl Comes ~ 4:00 pm yesterday and notified him that the patient been in the ER- Dr. Caryl Comes reviewed records.    Patient had documented w/ afib w/ RVR.  U/A was also done showing "moderate" leukocytes.  Per Dr. Caryl Comes, no urine cx done. The patient was discharged.  Dr. Caryl Comes sent a staff message to Dr. [name] stating: "This lady has had weakness and UA WBC+  no CS was sent and the symptoms were ascribed to afib RVR but by device interrogation that was a new finding in the 24 hrs prior to ER and her symptoms were longer than that   FYI ? Send a UA or empiric therapy make sense ??   Richardson Landry"

## 2018-11-21 NOTE — Telephone Encounter (Signed)
BP today at 11:15 AM, 112/56, HR 65. She took both losartan 50 mg and Diltiazem 30 mg this morning.   Routed to provider to advise.

## 2018-11-21 NOTE — Telephone Encounter (Signed)
Vital signs are stable on the additional medications.   Please have her continue the medications, as well as continue to document BP and HR so that they are available for Dr. Donivan Scull review at her upcoming virtual appointment next week. At this time, I would not recommend any medication changes given she has stable vitals and feeling well. If the grandson continues to worry, I would be happy to give him a call later today or tomorrow.

## 2018-11-21 NOTE — Telephone Encounter (Signed)
Patient discharged 11/20/18 from [facility] with diagnoses as follows:  Principal Problem:   Atrial fibrillation with RVR ([place]) Active Problems:   GERD   Hyperlipidemia   Elevated troponin   Acute on chronic renal failure ([place])  Per discharge summary:  Follow up with PCP in 1 week.   Note: patient's family has contacted cardiologist regarding change to dosage and frequency of Diltiazem. Note in EMR.   ___________________________________________________  TCM call completed with daughter-in-law Navea Woodrow  Transitional Care Management Follow-up Telephone Call    Date discharged? 11/20/2018  How have you been since you were released from the hospital? [place].   Any patient concerns? Generalized weakness and confusion.    Items Reviewed:  Medications reviewed: Yes  Allergies reviewed: Yes  Dietary changes reviewed: N/A  Referrals reviewed: Yes, cardiology  Functional Questionnaire:  Independent - I Dependent - D    Activities of Daily Living (ADLs):    Personal hygiene -  bedside with pan Dressing - I Eating - I Maintaining continence -  intermittent incontinence of bladder; wears pad Transferring - ambulates with walker or cane  Independent Activities of Daily Living (iADLs): Basic communication skills - I Transportation - Family Meal preparation  - Family or Arts development officer - Daughter in Doctor, hospital - I Managing medications -  Daughter in Careers adviser personal finances - Daughter in law   Confirmed importance and date/time of follow-up visits scheduled YES  Provider Appointment booked with PCP 11/26/18 @ 1030  Confirmed with patient if condition begins to worsen call PCP or go to the ER.  Patient was given the office number and encouraged to call back with question or concerns: YES

## 2018-11-21 NOTE — Telephone Encounter (Signed)
Returned call to Erlene Quan, grandson to review POC from provider. He verbalized understanding and will keep appt with Dr. Rockey Situ next week for virtual visit.  He will continue taking vitals and call back with any questions or concerns.

## 2018-11-21 NOTE — Telephone Encounter (Signed)
Please call, grandson would like to discuss Diltiazem. He is concerned pt BP is going to drop too low. He is on patient's DPR.  Pt c/o BP issue: STAT if pt c/o blurred vision, one-sided weakness or slurred speech  1. What are your last 5 BP readings?  4/7-7:30 am -129/69 HR 69, 2:40 pm -75/50 HR 89,  4/8 pt was in hospital   2. Are you having any other symptoms (ex. Dizziness, headache, blurred vision, passed out)? Dizzy, nauseated, some blurred vision, only when she is in afib  3. What is your BP issue? Too low  States it is ok to leave a message if he does not answer.

## 2018-11-21 NOTE — Telephone Encounter (Signed)
Returned call to grandson, Erlene Quan, in order to obtain more information for call.   He reports that Ms. Fairbairn was recently in the hospital for a fib with RVR and hypotension.  She was given IV diltiazem and converted in hospital. D/c was yesterday afternoon. Med changes were made to include diltiazem 30 mg TID.   Erlene Quan expresses concern adding more medication given her recent hypotension and already taking Losartan 50 mg once daily in AM. BP at time of discharge was 144/62. He did not have any updated vitals but reported that pt was feeling well and at baseline.   I called daughter in law, [name] (okay per DPR), to obtain vs but did not reach her. LMOM.  I believe this information is needed prior to routing to provider, to make decisions about possible medication changes.   Will route call after vitals are obtained.

## 2018-11-26 ENCOUNTER — Encounter: Payer: Self-pay | Admitting: Internal Medicine

## 2018-11-26 ENCOUNTER — Ambulatory Visit (INDEPENDENT_AMBULATORY_CARE_PROVIDER_SITE_OTHER): Payer: PPO | Admitting: Internal Medicine

## 2018-11-26 VITALS — BP 127/70 | HR 63 | Ht 64.0 in | Wt 142.0 lb

## 2018-11-26 DIAGNOSIS — I480 Paroxysmal atrial fibrillation: Secondary | ICD-10-CM | POA: Diagnosis not present

## 2018-11-26 DIAGNOSIS — F0150 Vascular dementia without behavioral disturbance: Secondary | ICD-10-CM | POA: Diagnosis not present

## 2018-11-26 DIAGNOSIS — F39 Unspecified mood [affective] disorder: Secondary | ICD-10-CM | POA: Diagnosis not present

## 2018-11-26 NOTE — Assessment & Plan Note (Signed)
Less anxiety now that she has supervision  No depression with her still at home

## 2018-11-26 NOTE — Progress Notes (Signed)
Subjective:    Patient ID: Leah Hayden, female    DOB: August 09, 1928, 85 y.o.   MRN: [id number]  HPI Virtual visit for hospital follow up Identification done Discussed billing and consent given DIL [name] is on as well as the patient---in her home I am in my office  Interactive audio and video telecommunications were attempted between this provider and patient, however failed, due to patient having technical difficulties OR patient did not have access to video capability.  We continued and completed visit with audio only.   Virtual Visit via Telephone Note  I connected with Herminia B Chovan on 11/26/18 at 10:44 by telephone and verified that I am speaking with the correct person using two identifiers.   I discussed the limitations, risks, security and privacy concerns of performing an evaluation and management service by telephone and the availability of in person appointments. I also discussed with the patient that there may be a patient responsible charge related to this service. The patient expressed understanding and agreed to proceed.   History of Present Illness: Reviewed hospital records and cardiologist notes  She notes "I don't really feel spry, but able to get around some" Appetite is good Doesn't have any palpitations ---or fast heart No chest pain Breathing is "normal" Has been able to do ADLs herself Son and DIL or aide bring prepared foods  Sleeping well---"too hard, I can't wake up too well" Memory was worse after the hospital---needs help with medications  Has caregiver from 8-3PM or even slightly longer DIL comes back at night---to make sure she gets the 3rd diltiazem  Urine looks normal No dysuria or hematuria  HR in the 60's  Rarely over 70 HR did spike up over 100 (107)  briefly this morning (with BP cuff)---then quickly back down into 60's Only get a heart rate--don't know if it is regular or not She does have some dizziness at times---if gets up quickly   Current Outpatient Medications on File Prior to Visit  Medication Sig Dispense Refill  . albuterol (PROVENTIL HFA) 108 (90 BASE) MCG/ACT inhaler Inhale 2 puffs into the lungs every 6 (six) hours as needed for wheezing or shortness of breath.     . cyanocobalamin 500 MCG tablet Take 500 mcg by mouth every other day.     . diltiazem (CARDIZEM) 30 MG tablet Take 1 tablet (30 mg total) by mouth every 8 (eight) hours for 30 days. 90 tablet 0  . donepezil (ARICEPT) 5 MG tablet TAKE 1 TABLET (5 MG TOTAL) BY MOUTH AT BEDTIME. 90 tablet 3  . Fluticasone-Salmeterol (ADVAIR DISKUS) 100-50 MCG/DOSE AEPB Inhale 1 puff into the lungs daily as needed (shortness of breath or wheezing).     . hydroxypropyl methylcellulose / hypromellose (ISOPTO TEARS / GONIOVISC) 2.5 % ophthalmic solution Place 1 drop into both eyes 3 (three) times daily as needed for dry eyes.    . montelukast (SINGULAIR) 10 MG tablet Take 10 mg by mouth every evening.     . Multiple Vitamins-Minerals (CENTRUM SILVER 50+WOMEN) TABS Take 1 tablet by mouth daily.    . nitroGLYCERIN (NITROSTAT) 0.4 MG SL tablet Place 1 tablet (0.4 mg total) under the tongue every 5 (five) minutes as needed for chest pain. 25 tablet 1  . pantoprazole (PROTONIX) 40 MG tablet Take 1 tablet (40 mg total) by mouth daily. 90 tablet 3  . Rivaroxaban (XARELTO) 15 MG TABS tablet Take 1 tablet (15 mg total) by mouth daily with supper. 90 tablet 3  .  VITAMIN D PO Take 5,000 Units by mouth daily.     Marland Kitchen losartan (COZAAR) 25 MG tablet Take 2 tablets (50 mg total) by mouth daily. 60 tablet 5   No current facility-administered medications on file prior to visit.     Allergies  Allergen Reactions  . Amiodarone Other (See Comments)    Chest discomfort  . Eliquis [Apixaban] Rash  . Other Rash    oramycin    Past Medical History:  Diagnosis Date  . Allergic rhinitis   . Asthma   . Biceps tendon tear    a. right->s/p surgical repair winter of 2014.  . Diverticulosis of  colon   . GERD (gastroesophageal reflux disease)   . Hammer toe of right foot   . Hx of colonic polyps   . HX: breast cancer   . Meniere's disease   . NSTEMI (non-ST elevated myocardial infarction) ([place])    a. 11/2012 in setting of rapid afib;  b. 11/2012 Cath: nl EF, nonobs CAD->Med Rx;  b. 11/2012 Echo: EF 60-65%, mild LVH, mild MR/TR.  . Osteoarthritis   . PAF (paroxysmal atrial fibrillation) ([place])    a. Eliquis and amio initiated 11/2012 in setting of admission with RVR and NSTEMI;  b. Subsequently changed to flecainide and xarelto;  c. 11/2013 Echo: EF 60-65%, mild LVH, mod MR/TR.  Marland Kitchen Sinus brady-tachy syndrome ([place])    a. Post-conversion pause of 7.2 seconds during 11/2012 hospitalization @ [facility]; b. 04/2017 Event Monitor: No significant arrhythmias.    Past Surgical History:  Procedure Laterality Date  . ABDOMINAL HYSTERECTOMY    . CARDIAC CATHETERIZATION  11/2013   [facility]  . [place]   2nd to left toe  . ESOPHAGOGASTRODUODENOSCOPY  10/1999   inflammation only   . FOREIGN BODY REMOVAL Right 01/02/2013   Procedure: FOREIGN BODY REMOVAL RIGHT INDEX FINGER;  Surgeon: Cammie Sickle., MD;  Location: [facility];  Service: Orthopedics;  Laterality: Right;  . HAMMER TOE SURGERY  11/15   Dr Milinda Pointer  . MASTECTOMY  1984   left   . MASTECTOMY  1985   right  . PACEMAKER IMPLANT N/A 09/20/2018   Procedure: PACEMAKER IMPLANT;  Surgeon: Deboraha Sprang, MD;  Location: [facility];  Service: Cardiovascular;  Laterality: N/A;  . PARTIAL HYSTERECTOMY  1966   w/ bladder tack  . ROTATOR CUFF REPAIR  10/09   right   . TOE SURGERY  2001/2002   2nd/3rd,left     Family History  Problem Relation Age of Onset  . Heart failure Mother   . Gout Mother   . Heart disease Mother        heart failure  . Arthritis Mother   . Heart failure Father   . Heart disease Father        heart failure  . Diabetes Maternal Grandmother   . Colon cancer Maternal Grandfather    . Cancer Maternal Grandfather        colon  . Arthritis Brother     Social History   Socioeconomic History  . Marital status: Widowed    Spouse name: Not on file  . Number of children: 2  . Years of education: Not on file  . Highest education level: Not on file  Occupational History  . 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 Financial resource strain: Not on file  . Food insecurity:    Worry:  Not on file    Inability: Not on file  . Transportation needs:    Medical: Not on file    Non-medical: Not on file  Tobacco Use  . Smoking status: Never Smoker  . Smokeless tobacco: Never Used  Substance and Sexual Activity  . Alcohol use: No    Alcohol/week: 0.0 standard drinks  . Drug use: No  . Sexual activity: Not on file  Lifestyle  . Physical activity:    Days per week: Not on file    Minutes per session: Not on file  . Stress: Not on file  Relationships  . Social connections:    Talks on phone: Not on file    Gets together: Not on file    Attends religious service: Not on file    Active member of club or organization: Not on file    Attends meetings of clubs or organizations: Not on file    Relationship status: Not on file  . Intimate partner violence:    Fear of current or ex partner: Not on file    Emotionally abused: Not on file    Physically abused: Not on file    Forced sexual activity: Not on file  Other Topics Concern  . Not on file  Social History Narrative   Now has living will   Sons are health care POAs   Has DNR   No tube feeds if cognitively unaware    Observations/Objective: Alert and coherent but poor memory No obvious dyspnea Mood is neutral  Assessment and Plan: See problem list  Follow Up Instructions:    I discussed the assessment and treatment plan with the patient. The patient was provided an opportunity to ask questions and all were answered. The patient agreed with the plan and demonstrated  an understanding of the instructions.   The patient was advised to call back or seek an in-person evaluation if the symptoms worsen or if the condition fails to improve as anticipated.  I provided 15  minutes of non-face-to-face time during this encounter.   Viviana Simpler, MD    Review of Systems     Objective:   Physical Exam         Assessment & Plan:

## 2018-11-26 NOTE — Assessment & Plan Note (Signed)
Doing well in her home with daily caregiver and family supervision

## 2018-11-26 NOTE — Assessment & Plan Note (Signed)
Has still had apparent paroxysms of higher heart rate (that they don't know if it is irregular or regular--but reasonable to assume atrial fib if over 100, and usually in the 60's) Some dizziness--but vague about when (not in past few days) Energy levels are increasing and eating well If persistent elevations of heart rate--would change to daily diltiazem 120 (and this would help DIL with med administration but I am otherwise hesitant to do now due to relatively low HR)

## 2018-11-28 ENCOUNTER — Other Ambulatory Visit: Payer: Self-pay

## 2018-11-28 ENCOUNTER — Telehealth (INDEPENDENT_AMBULATORY_CARE_PROVIDER_SITE_OTHER): Payer: PPO | Admitting: Cardiovascular Disease

## 2018-11-28 DIAGNOSIS — R001 Bradycardia, unspecified: Secondary | ICD-10-CM

## 2018-11-28 DIAGNOSIS — I495 Sick sinus syndrome: Secondary | ICD-10-CM | POA: Diagnosis not present

## 2018-11-28 DIAGNOSIS — I471 Supraventricular tachycardia, unspecified: Secondary | ICD-10-CM

## 2018-11-28 DIAGNOSIS — R413 Other amnesia: Secondary | ICD-10-CM | POA: Diagnosis not present

## 2018-11-28 DIAGNOSIS — I25119 Atherosclerotic heart disease of native coronary artery with unspecified angina pectoris: Secondary | ICD-10-CM | POA: Diagnosis not present

## 2018-11-28 DIAGNOSIS — I10 Essential (primary) hypertension: Secondary | ICD-10-CM | POA: Diagnosis not present

## 2018-11-28 DIAGNOSIS — I480 Paroxysmal atrial fibrillation: Secondary | ICD-10-CM | POA: Diagnosis not present

## 2018-11-28 DIAGNOSIS — Z950 Presence of cardiac pacemaker: Secondary | ICD-10-CM | POA: Diagnosis not present

## 2018-11-28 DIAGNOSIS — I2510 Atherosclerotic heart disease of native coronary artery without angina pectoris: Secondary | ICD-10-CM | POA: Diagnosis not present

## 2018-11-28 DIAGNOSIS — F0390 Unspecified dementia without behavioral disturbance: Secondary | ICD-10-CM

## 2018-11-28 DIAGNOSIS — F0150 Vascular dementia without behavioral disturbance: Secondary | ICD-10-CM | POA: Diagnosis not present

## 2018-11-28 NOTE — Patient Instructions (Signed)
Medication Instructions:  No changes  If you need a refill on your cardiac medications before your next appointment, please call your pharmacy.    Lab work: No new labs needed   If you have labs (blood work) drawn today and your tests are completely normal, you will receive your results only by: Marland Kitchen MyChart Message (if you have MyChart) OR . A paper copy in the mail If you have any lab test that is abnormal or we need to change your treatment, we will call you to review the results.   Testing/Procedures: No new testing needed   Follow-Up: At [facility], you and your health needs are our priority.  As part of our continuing mission to provide you with exceptional heart care, we have created designated Provider Care Teams.  These Care Teams include your primary Cardiologist (physician) and Advanced Practice Providers (APPs -  Physician Assistants and Nurse Practitioners) who all work together to provide you with the care you need, when you need it.  . You will need a follow up appointment in 3 months .   Please call our office 2 months in advance to schedule this appointment.    . Providers on your designated Care Team:   . Murray Hodgkins, NP . Christell Faith, PA-C . Marrianne Mood, PA-C  Any Other Special Instructions Will Be Listed Below (If Applicable).  For educational health videos Log in to : [email or web address] Or : [email or web address], password : [email or web address]

## 2018-11-28 NOTE — Progress Notes (Signed)
Virtual Visit via Telephone Note   This visit type was conducted due to national recommendations for restrictions regarding the COVID-19 Pandemic (e.g. social distancing) in an effort to limit this patient's exposure and mitigate transmission in our community.  Due to her co-morbid illnesses, this patient is at least at moderate risk for complications without adequate follow up.  This format is felt to be most appropriate for this patient at this time.  The patient did not have access to video technology/had technical difficulties with video requiring transitioning to audio format only (telephone).  All issues noted in this document were discussed and addressed.  No physical exam could be performed with this format.  Please refer to the patient's chart for her  consent to telehealth for [facility].   I connected with  Leah Hayden on 11/28/18 by a video enabled telemedicine application and verified that I am speaking with the correct person using two identifiers. I discussed the limitations of evaluation and management by telemedicine. The patient expressed understanding and agreed to proceed.   Evaluation Performed:  Follow-up visit  Date:  11/28/2018   ID:  Leah Hayden, DOB 05/12/1928, MRN [id number]  Patient Location:  [place] [address]   Provider location:   [facility], [place] office  PCP:  Venia Carbon, MD  Cardiologist:  Patsy [place]   Chief Complaint: Labile blood pressure   History of Present Illness:    Leah Hayden is a 85 y.o. female who presents via audio/video conferencing for a telehealth visit today.   The patient does not symptoms concerning for COVID-19 infection (fever, chills, cough, or new SHORTNESS OF BREATH).   Patient has a past medical history of  NSTEMI 11/2013, nonobstructive CAD by [facility] 11/2013,  PAF on Xarelto complicated by posttermination pauses,  SVT,  tachybradycardia syndrome,  dementia,   breast cancer,  Mnire's disease,  asthma, diverticulosis, and GERD  Recent hospitalization April 2020 atrial fibrillation with RVR and elevated troponin  Who presents for discussion of her blood pressure and atrial fibrillation, hospital follow-up  On 3/27 and 11/19/18 the patient's daughter in law called into the office noting the patient was not feeling well and her concern that the patient was in Afib.  Device checks after both calls did not show Afib  The daughter also  concerned about the patient's confusion.  Later that evening, the daughter in law called EMS, and the patient was brought to the ED with c/o nausea, tender abdomen, SOB, and weakness x2 days.  EMS vitals significant for BP 107/67 (NS 500cc given) and 120-160 BPM. Per daughter in law, no missed doses of Xarelto.   In the ED, EKG showed pacing with underlying Afib and HR 147, new RBBB compared to 09/2018, and TWI.  CXR negative for acute changes.  She received 1 dose of diltiazem with a drop in her pressures and HR.  She was started on a diltiazem drip with pacer interrogation was completed.  Labs showed troponin 0.07.   She converted to normal sinus rhythm while in the hospital and discharged on diltiazem 30 every 8 Also on losartan 25 twice daily Xarelto 15 daily  On discussion today blood pressure seems to run between 419 up to 379 systolic Pulse is 02-40, 1 time was 107 Reports she is sleeping well, eating well Does some activities around the house, no regular exercise program Daughter has been checking blood pressures 3 or more times  a day Nurses also checking blood pressures  Daughter calls and tells her when to take her medication  Prior CV studies:   The following studies were reviewed today:  Echo 03/2018 - Left ventricle: The cavity size was normal. There was mild   concentric hypertrophy. Systolic function was normal. The   estimated ejection fraction was in the range of 60% to 65%. Wall   motion  was normal; there were no regional wall motion   abnormalities. Doppler parameters are consistent with abnormal   left ventricular relaxation (grade 1 diastolic dysfunction). - Left atrium: The atrium was mildly dilated. - Right ventricle: Systolic function was normal. - Pulmonary arteries: Systolic pressure was within the normal   range.   Past Medical History:  Diagnosis Date  . Allergic rhinitis   . Asthma   . Biceps tendon tear    a. right->s/p surgical repair winter of 2014.  . Diverticulosis of colon   . GERD (gastroesophageal reflux disease)   . Hammer toe of right foot   . Hx of colonic polyps   . HX: breast cancer   . Meniere's disease   . NSTEMI (non-ST elevated myocardial infarction) ([place])    a. 11/2012 in setting of rapid afib;  b. 11/2012 Cath: nl EF, nonobs CAD->Med Rx;  b. 11/2012 Echo: EF 60-65%, mild LVH, mild MR/TR.  . Osteoarthritis   . PAF (paroxysmal atrial fibrillation) ([place])    a. Eliquis and amio initiated 11/2012 in setting of admission with RVR and NSTEMI;  b. Subsequently changed to flecainide and xarelto;  c. 11/2013 Echo: EF 60-65%, mild LVH, mod MR/TR.  Marland Kitchen Sinus brady-tachy syndrome ([place])    a. Post-conversion pause of 7.2 seconds during 11/2012 hospitalization @ [facility]; b. 04/2017 Event Monitor: No significant arrhythmias.   Past Surgical History:  Procedure Laterality Date  . ABDOMINAL HYSTERECTOMY    . CARDIAC CATHETERIZATION  11/2013   [facility]  . [place]   2nd to left toe  . ESOPHAGOGASTRODUODENOSCOPY  10/1999   inflammation only   . FOREIGN BODY REMOVAL Right 01/02/2013   Procedure: FOREIGN BODY REMOVAL RIGHT INDEX FINGER;  Surgeon: Cammie Sickle., MD;  Location: [place];  Service: Orthopedics;  Laterality: Right;  . HAMMER TOE SURGERY  11/15   Dr Milinda Pointer  . MASTECTOMY  1984   left   . MASTECTOMY  1985   right  . PACEMAKER IMPLANT N/A 09/20/2018   Procedure: PACEMAKER IMPLANT;  Surgeon: Deboraha Sprang, MD;   Location: [place] [facility];  Service: Cardiovascular;  Laterality: N/A;  . PARTIAL HYSTERECTOMY  1966   w/ bladder tack  . ROTATOR CUFF REPAIR  10/09   right   . TOE SURGERY  2001/2002   2nd/3rd,left      No outpatient medications have been marked as taking for the 11/28/18 encounter (Appointment) with Minna Merritts, MD.     Allergies:   Amiodarone; Eliquis [apixaban]; and Other   Social History   Tobacco Use  . Smoking status: Never Smoker  . Smokeless tobacco: Never Used  Substance Use Topics  . Alcohol use: No    Alcohol/week: 0.0 standard drinks  . Drug use: No     Current Outpatient Medications on File Prior to Visit  Medication Sig Dispense Refill  . albuterol (PROVENTIL HFA) 108 (90 BASE) MCG/ACT inhaler Inhale 2 puffs into the lungs every 6 (six) hours as needed for wheezing or shortness of breath.     Marland Kitchen  cyanocobalamin 500 MCG tablet Take 500 mcg by mouth every other day.     . diltiazem (CARDIZEM) 30 MG tablet Take 1 tablet (30 mg total) by mouth every 8 (eight) hours for 30 days. 90 tablet 0  . donepezil (ARICEPT) 5 MG tablet TAKE 1 TABLET (5 MG TOTAL) BY MOUTH AT BEDTIME. 90 tablet 3  . Fluticasone-Salmeterol (ADVAIR DISKUS) 100-50 MCG/DOSE AEPB Inhale 1 puff into the lungs daily as needed (shortness of breath or wheezing).     . hydroxypropyl methylcellulose / hypromellose (ISOPTO TEARS / GONIOVISC) 2.5 % ophthalmic solution Place 1 drop into both eyes 3 (three) times daily as needed for dry eyes.    Marland Kitchen losartan (COZAAR) 25 MG tablet Take 2 tablets (50 mg total) by mouth daily. 60 tablet 5  . montelukast (SINGULAIR) 10 MG tablet Take 10 mg by mouth every evening.     . Multiple Vitamins-Minerals (CENTRUM SILVER 50+WOMEN) TABS Take 1 tablet by mouth daily.    . nitroGLYCERIN (NITROSTAT) 0.4 MG SL tablet Place 1 tablet (0.4 mg total) under the tongue every 5 (five) minutes as needed for chest pain. 25 tablet 1  . pantoprazole (PROTONIX) 40 MG tablet Take 1 tablet  (40 mg total) by mouth daily. 90 tablet 3  . Rivaroxaban (XARELTO) 15 MG TABS tablet Take 1 tablet (15 mg total) by mouth daily with supper. 90 tablet 3  . VITAMIN D PO Take 5,000 Units by mouth daily.      No current facility-administered medications on file prior to visit.      Family Hx: The patient's family history includes Arthritis in her brother and mother; Cancer in her maternal grandfather; Colon cancer in her maternal grandfather; Diabetes in her maternal grandmother; Gout in her mother; Heart disease in her father and mother; Heart failure in her father and mother.  ROS:   Please see the history of present illness.    Review of Systems  Constitutional: Negative.   Respiratory: Negative.   Cardiovascular: Negative.   Gastrointestinal: Negative.   Musculoskeletal: Negative.   Neurological: Negative.   Psychiatric/Behavioral: Positive for memory loss.  All other systems reviewed and are negative.    Labs/Other Tests and Data Reviewed:    Recent Labs: 07/25/2018: Magnesium 1.9; TSH 0.851 11/20/2018: ALT 15; B Natriuretic Peptide 684.0; BUN 25; Creatinine, Ser 1.05; Hemoglobin 12.1; Platelets 201; Potassium 4.3; Sodium 142   Recent Lipid Panel Lab Results  Component Value Date/Time   CHOL 154 07/25/2018 03:10 PM   CHOL 122 01/16/2014 08:43 AM   TRIG 41 07/25/2018 03:10 PM   HDL 54 07/25/2018 03:10 PM   HDL 70 01/16/2014 08:43 AM   CHOLHDL 2.9 07/25/2018 03:10 PM   LDLCALC 92 07/25/2018 03:10 PM   LDLCALC 38 01/16/2014 08:43 AM    Wt Readings from Last 3 Encounters:  11/26/18 142 lb (64.4 kg)  11/20/18 144 lb 6.4 oz (65.5 kg)  09/23/18 139 lb 8 oz (63.3 kg)     Exam:    Vital Signs: Vital signs may also be detailed in the HPI There were no vitals taken for this visit.  Wt Readings from Last 3 Encounters:  11/26/18 142 lb (64.4 kg)  11/20/18 144 lb 6.4 oz (65.5 kg)  09/23/18 139 lb 8 oz (63.3 kg)   Temp Readings from Last 3 Encounters:  11/20/18 98.3 F  (36.8 C) (Oral)  09/23/18 98 F (36.7 C)  09/21/18 98.2 F (36.8 C) (Oral)   BP Readings from Last 3 Encounters:  11/26/18 127/70  11/20/18 (!) 144/62  09/23/18 (!) 148/74   Pulse Readings from Last 3 Encounters:  11/26/18 63  11/20/18 73  09/23/18 74    143/78, before morning blood pressure pill 119/54 Pulse 64,   149/76 later today   Well nourished, well developed female in no acute distress. Constitutional:  oriented to person, place, and time. No distress.    ASSESSMENT & PLAN:    PAF (paroxysmal atrial fibrillation) ([place])- Plan: EKG 12-Lead Flecainide Previously held for sick sinus syndrome and symptomatic bradycardia Previously with bradycardia on other agents including calcium channel blockers and beta-blockers -Appears to be tolerating low-dose diltiazem 30 every 8 without symptoms of near syncope concerning for bradycardia As blood pressure and rate/rhythm controlled, recommend she keep on the low-dose diltiazem 30 every 8 -Continue anticoagulation For any episode of atrial fibrillation she could take extra diltiazem  SVT/ PSVT/ PAT -Low-dose diltiazem Denies any tachycardia or palpitations  Atherosclerosis of native coronary artery of native heart with angina pectoris (HCC) Previous cardiac catheterization with no significant disease -No further ischemic work-up needed at this time  Memory loss:  Managed by primary care,  Family involved Moderatecognitive decline Lives independently with frequent visits from visitors  Essential hypertension We did discuss possibly changing losartan to 25 twice daily  If blood pressure runs low could hold the evening dose Daughter will consider making changes if it works for them   COVID-19 Education: The signs and symptoms of COVID-19 were discussed with the patient and how to seek care for testing (follow up with PCP or arrange E-visit).  The importance of social distancing was discussed today.  Patient  Risk:   After full review of this patients clinical status, I feel that they are at least moderate risk at this time.  Time:   Today, I have spent 25 minutes with the patient with telehealth technology discussing the cardiac and medical problems/diagnoses detailed above   10 min spent reviewing the chart prior to patient visit today   Medication Adjustments/Labs and Tests Ordered: Current medicines are reviewed at length with the patient today.  Concerns regarding medicines are outlined above.   Tests Ordered: No tests ordered   Medication Changes: No changes made   Disposition: Follow-up in 3 months   Signed, Ida Rogue, MD  11/28/2018 2:10 PM    [place] Office [address] [place] [address], [place],  [address]

## 2018-12-17 ENCOUNTER — Telehealth: Payer: Self-pay | Admitting: Cardiovascular Disease

## 2018-12-17 NOTE — Telephone Encounter (Signed)
°*  STAT* If patient is at the pharmacy, call can be transferred to refill team.   1. Which medications need to be refilled? (please list name of each medication and dose if known) diltiazem 30 mg po TID   2. Which pharmacy/location (including street and city if local pharmacy) is medication to be sent to? [facility] [address] [place] [place]  3. Do they need a 30 day or 90 day supply? [place]

## 2018-12-17 NOTE — Telephone Encounter (Signed)
Please advise if ok to refill Diltiazem 30 mg tid. Medication last filled by [facility].

## 2018-12-18 ENCOUNTER — Other Ambulatory Visit: Payer: Self-pay | Admitting: *Deleted

## 2018-12-18 MED ORDER — DILTIAZEM HCL 30 MG PO TABS: 30.0000 mg | ORAL_TABLET | Freq: Three times a day (TID) | ORAL | 2 refills | Status: DC

## 2018-12-19 ENCOUNTER — Telehealth: Payer: Self-pay | Admitting: Internal Medicine

## 2018-12-19 NOTE — Telephone Encounter (Signed)
I left a detailed message for Leah Hayden, pt's daughter in law, to call back and reschedule appointment in office to June or July.

## 2018-12-20 ENCOUNTER — Ambulatory Visit: Payer: PPO | Admitting: Podiatry

## 2018-12-20 ENCOUNTER — Other Ambulatory Visit: Payer: Self-pay

## 2018-12-20 ENCOUNTER — Encounter: Payer: Self-pay | Admitting: Podiatry

## 2018-12-20 VITALS — Temp 97.4°F

## 2018-12-20 DIAGNOSIS — L989 Disorder of the skin and subcutaneous tissue, unspecified: Secondary | ICD-10-CM | POA: Diagnosis not present

## 2018-12-20 DIAGNOSIS — B351 Tinea unguium: Secondary | ICD-10-CM

## 2018-12-20 DIAGNOSIS — M79676 Pain in unspecified toe(s): Secondary | ICD-10-CM

## 2018-12-23 ENCOUNTER — Ambulatory Visit: Payer: PPO | Admitting: Internal Medicine

## 2018-12-24 ENCOUNTER — Telehealth: Payer: PPO | Admitting: Internal Medicine

## 2018-12-26 DIAGNOSIS — H903 Sensorineural hearing loss, bilateral: Secondary | ICD-10-CM | POA: Diagnosis not present

## 2018-12-26 DIAGNOSIS — H6123 Impacted cerumen, bilateral: Secondary | ICD-10-CM | POA: Diagnosis not present

## 2018-12-27 ENCOUNTER — Encounter: Payer: Self-pay | Admitting: Internal Medicine

## 2018-12-27 ENCOUNTER — Telehealth: Payer: Self-pay | Admitting: Internal Medicine

## 2018-12-27 NOTE — Telephone Encounter (Signed)
Transmission received 12/27/2018

## 2018-12-27 NOTE — Telephone Encounter (Signed)
Patient grandson Erlene Quan calling States that patient had a difficult night last night, she has been weak and couldn't move much Would like for [place] to check her pacemaker monitor to see if there was any unusual activity  Please call Erlene Quan to discuss

## 2018-12-27 NOTE — Progress Notes (Unsigned)
Called patient who admits to some confusion,  Told her PM function was normal.  Has not felt great but unable to be specific.  Called son but got no answer; left VM

## 2018-12-30 NOTE — Telephone Encounter (Signed)
Reviewed transmission with Dr Caryl Comes 12/27/18

## 2018-12-30 NOTE — Telephone Encounter (Signed)
Called and spoke to pt and then left VM with her son that interrogationwas normal

## 2018-12-31 ENCOUNTER — Other Ambulatory Visit: Payer: Self-pay

## 2018-12-31 ENCOUNTER — Ambulatory Visit (INDEPENDENT_AMBULATORY_CARE_PROVIDER_SITE_OTHER): Payer: PPO | Admitting: *Deleted

## 2018-12-31 DIAGNOSIS — I495 Sick sinus syndrome: Secondary | ICD-10-CM | POA: Diagnosis not present

## 2019-01-01 NOTE — Telephone Encounter (Signed)
Patient grandson calling back  States to schedule for 1030a, will have to get in touch with aunt to see if patient will be able to make it Will call back to tomorrow to cancel if need be

## 2019-01-01 NOTE — Telephone Encounter (Signed)
Dr. Caryl Comes sent a message to me that he would like the patient to come in to reprogram her device (lower her detection rates).  I attempted to call Erlene Quan. I left a message for him to please call back to see if the patient could come in the office tomorrow- mid morning at 10:00 am/ 10:30 am to see Dr. Caryl Comes.  Scheduling, if Erlene Quan calls back and you please see if she can come tomorrow at one of these times- ok to double book.

## 2019-01-02 ENCOUNTER — Ambulatory Visit (INDEPENDENT_AMBULATORY_CARE_PROVIDER_SITE_OTHER): Payer: PPO | Admitting: Internal Medicine

## 2019-01-02 ENCOUNTER — Encounter: Payer: Self-pay | Admitting: Internal Medicine

## 2019-01-02 ENCOUNTER — Other Ambulatory Visit: Payer: Self-pay

## 2019-01-02 VITALS — BP 130/60 | Ht 64.0 in | Wt 147.0 lb

## 2019-01-02 DIAGNOSIS — I480 Paroxysmal atrial fibrillation: Secondary | ICD-10-CM | POA: Diagnosis not present

## 2019-01-02 LAB — CUP PACEART REMOTE DEVICE CHECK
Battery Remaining Percentage: 100 %
Brady Statistic RA Percent Paced: 85 %
Brady Statistic RV Percent Paced: 0 %
Date Time Interrogation Session: 20200521091157
Implantable Lead Implant Date: 20200207
Implantable Lead Implant Date: 20200207
Implantable Lead Location: 753859
Implantable Lead Location: 753860
Implantable Lead Model: 5076
Implantable Pulse Generator Implant Date: 20200207
Lead Channel Impedance Value: 468 Ohm
Lead Channel Impedance Value: 722 Ohm
Lead Channel Pacing Threshold Amplitude: 0.8 V
Lead Channel Pacing Threshold Pulse Width: 0.4 ms
Lead Channel Sensing Intrinsic Amplitude: 13.8 mV
Lead Channel Sensing Intrinsic Amplitude: 3.9 mV
Lead Channel Setting Pacing Amplitude: 3 V
Lead Channel Setting Pacing Amplitude: 3 V
Lead Channel Setting Pacing Pulse Width: 0.4 ms
Pulse Gen Model: 407145
Pulse Gen Serial Number: 69503042

## 2019-01-02 NOTE — Progress Notes (Signed)
Patient Care Team: Venia Carbon, MD as PCP - General Rockey Situ, Kathlene November, MD as PCP - Cardiology (Cardiology) Minna Merritts, MD as Consulting Physician (Cardiology)   HPI  Leah Hayden is a 85 y.o. female seen today as an add-on because of an episode last week wherein she awoke confused, weak, dysarthric?Marland Kitchen  Family took vital signs which were normal.  A device interrogation has been unrevealing; however, it was remembered by HM-RN that she had presented with an atrial tachycardia earlier in the winter just below her detection rate of 160.  According to her daughter she is much improved and back to her baseline.  No issues of dyspnea pain or edema.  She remains confused but this too has returned to baseline   Records and Results Reviewed   Past Medical History:  Diagnosis Date  . Allergic rhinitis   . Asthma   . Biceps tendon tear    a. right->s/p surgical repair winter of 2014.  . Diverticulosis of colon   . GERD (gastroesophageal reflux disease)   . Hammer toe of right foot   . Hx of colonic polyps   . HX: breast cancer   . Meniere's disease   . NSTEMI (non-ST elevated myocardial infarction) ([place])    a. 11/2012 in setting of rapid afib;  b. 11/2012 Cath: nl EF, nonobs CAD->Med Rx;  b. 11/2012 Echo: EF 60-65%, mild LVH, mild MR/TR.  . Osteoarthritis   . PAF (paroxysmal atrial fibrillation) ([place])    a. Eliquis and amio initiated 11/2012 in setting of admission with RVR and NSTEMI;  b. Subsequently changed to flecainide and xarelto;  c. 11/2013 Echo: EF 60-65%, mild LVH, mod MR/TR.  Marland Kitchen Sinus brady-tachy syndrome ([place])    a. Post-conversion pause of 7.2 seconds during 11/2012 hospitalization @ [facility]; b. 04/2017 Event Monitor: No significant arrhythmias.    Past Surgical History:  Procedure Laterality Date  . ABDOMINAL HYSTERECTOMY    . CARDIAC CATHETERIZATION  [facility]   [facility]  . [place]   2nd to left toe  . ESOPHAGOGASTRODUODENOSCOPY  10/1999   inflammation only   . FOREIGN BODY REMOVAL Right 01/02/2013   Procedure: FOREIGN BODY REMOVAL RIGHT INDEX FINGER;  Surgeon: Cammie Sickle., MD;  Location: [place];  Service: Orthopedics;  Laterality: Right;  . HAMMER TOE SURGERY  11/15   Dr Milinda Pointer  . MASTECTOMY  1984   left   . MASTECTOMY  1985   right  . PACEMAKER IMPLANT N/A 09/20/2018   Procedure: PACEMAKER IMPLANT;  Surgeon: Deboraha Sprang, MD;  Location: [place] [facility];  Service: Cardiovascular;  Laterality: N/A;  . PARTIAL HYSTERECTOMY  1966   w/ bladder tack  . ROTATOR CUFF REPAIR  10/09   right   . TOE SURGERY  2001/2002   2nd/3rd,left     Current Meds  Medication Sig  . albuterol (PROVENTIL HFA) 108 (90 BASE) MCG/ACT inhaler Inhale 2 puffs into the lungs every 6 (six) hours as needed for wheezing or shortness of breath.   . cyanocobalamin 500 MCG tablet Take 500 mcg by mouth every other day.   . diltiazem (CARDIZEM) 30 MG tablet Take 1 tablet (30 mg total) by mouth every 8 (eight) hours for 30 days.  Marland Kitchen donepezil (ARICEPT) 5 MG tablet TAKE 1 TABLET (5 MG TOTAL) BY MOUTH AT BEDTIME.  Marland Kitchen Fluticasone-Salmeterol (ADVAIR DISKUS) 100-50 MCG/DOSE AEPB Inhale 1 puff into the lungs daily as  needed (shortness of breath or wheezing).   . hydroxypropyl methylcellulose / hypromellose (ISOPTO TEARS / GONIOVISC) 2.5 % ophthalmic solution Place 1 drop into both eyes 3 (three) times daily as needed for dry eyes.  Marland Kitchen losartan (COZAAR) 25 MG tablet Take 2 tablets (50 mg total) by mouth daily.  . montelukast (SINGULAIR) 10 MG tablet Take 10 mg by mouth every evening.   . Multiple Vitamins-Minerals (CENTRUM SILVER 50+WOMEN) TABS Take 1 tablet by mouth daily.  . nitroGLYCERIN (NITROSTAT) 0.4 MG SL tablet Place 1 tablet (0.4 mg total) under the tongue every 5 (five) minutes as needed for chest pain.  . pantoprazole (PROTONIX) 40 MG tablet Take 1 tablet (40 mg total) by mouth daily.  . Rivaroxaban (XARELTO) 15 MG TABS tablet  Take 1 tablet (15 mg total) by mouth daily with supper.  Marland Kitchen VITAMIN D PO Take 5,000 Units by mouth daily.     Allergies  Allergen Reactions  . Amiodarone Other (See Comments)    Chest discomfort  . Eliquis [Apixaban] Rash  . Other Rash    oramycin      Review of Systems negative except from HPI and PMH  Physical Exam BP 130/60 (BP Location: Left Arm, Patient Position: Sitting, Cuff Size: Normal)   Ht 5\' 4"  (1.626 m)   Wt 147 lb (66.7 kg)   BMI 25.23 kg/m  Well developed and well nourished in no acute distress HENT normal Neck supple  Device pocket well healed; without hematoma or erythema.  There is no tethering  Regular rate   No Clubbing cyanosis  edema Skin-warm and dry A   Grossly normal sensory and motor function       Assessment and  Plan Atrial tachycardia/fibrillation with posttermination pauses  Tachybradycardia syndrome  Pacemaker-Biotronik  Device was interrogated, found to be functioning normally.  Thresholds were checked and were both less than 1 V but, I think I forgot to reprogram outputs  I did reprogram atrial fibrillation detection rates from 160--130    Current medicines are reviewed at length with the patient today .  The patient does no have concerns regarding medicines.  Will see again n 3 months to reprogram outputs

## 2019-01-02 NOTE — Patient Instructions (Signed)
Medication Instructions:  - Your physician recommends that you continue on your current medications as directed. Please refer to the Current Medication list given to you today.  If you need a refill on your cardiac medications before your next appointment, please call your pharmacy.   Lab work: - none ordered  If you have labs (blood work) drawn today and your tests are completely normal, you will receive your results only by: Marland Kitchen MyChart Message (if you have MyChart) OR . A paper copy in the mail If you have any lab test that is abnormal or we need to change your treatment, we will call you to review the results.  Testing/Procedures: - none ordered  Follow-Up: At [facility], you and your health needs are our priority.  As part of our continuing mission to provide you with exceptional heart care, we have created designated Provider Care Teams.  These Care Teams include your primary Cardiologist (physician) and Advanced Practice Providers (APPs -  Physician Assistants and Nurse Practitioners) who all work together to provide you with the care you need, when you need it.  You will need a follow up appointment in 9 months (February 2021) with Dr. Caryl Comes. Marland Kitchen Please call our office 2 months in advance to schedule this appointment.  (call in early December to schedule)   Any Other Special Instructions Will Be Listed Below (If Applicable). - N/A

## 2019-01-07 NOTE — Progress Notes (Signed)
   SUBJECTIVE Patient presents to office today complaining of elongated, thickened nails that cause pain while ambulating in shoes. She is unable to trim her own nails. Patient is here for further evaluation and treatment.  Past Medical History:  Diagnosis Date  . Allergic rhinitis   . Asthma   . Biceps tendon tear    a. right->s/p surgical repair winter of 2014.  . Diverticulosis of colon   . GERD (gastroesophageal reflux disease)   . Hammer toe of right foot   . Hx of colonic polyps   . HX: breast cancer   . Meniere's disease   . NSTEMI (non-ST elevated myocardial infarction) ([place])    a. 11/2012 in setting of rapid afib;  b. 11/2012 Cath: nl EF, nonobs CAD->Med Rx;  b. 11/2012 Echo: EF 60-65%, mild LVH, mild MR/TR.  . Osteoarthritis   . PAF (paroxysmal atrial fibrillation) ([place])    a. Eliquis and amio initiated 11/2012 in setting of admission with RVR and NSTEMI;  b. Subsequently changed to flecainide and xarelto;  c. 11/2013 Echo: EF 60-65%, mild LVH, mod MR/TR.  Marland Kitchen Sinus brady-tachy syndrome ([place])    a. Post-conversion pause of 7.2 seconds during 11/2012 hospitalization @ [facility]; b. 04/2017 Event Monitor: No significant arrhythmias.    OBJECTIVE General Patient is awake, alert, and oriented x 3 and in no acute distress. Derm Skin is dry and supple bilateral. Negative open lesions or macerations. Remaining integument unremarkable. Nails are tender, long, thickened and dystrophic with subungual debris, consistent with onychomycosis, 1-5 bilateral. No signs of infection noted.  There is some hyperkeratotic callus tissue noted to bilateral feet Vasc  DP and PT pedal pulses palpable bilaterally. Temperature gradient within normal limits.  Neuro Epicritic and protective threshold sensation grossly intact bilaterally.  Musculoskeletal Exam No symptomatic pedal deformities noted bilateral. Muscular strength within normal limits.  ASSESSMENT 1. Onychodystrophic nails 1-5 bilateral with  hyperkeratosis of nails.  2. Onychomycosis of nail due to dermatophyte bilateral 3.  Symptomatic callus lesions bilateral feet  PLAN OF CARE 1. Patient evaluated today.  2. Instructed to maintain good pedal hygiene and foot care.  3. Mechanical debridement of nails 1-5 bilaterally performed using a nail nipper. Filed with dremel without incident.  4.  Excisional debridement of the callus lesions was performed using a tissue nipper without incident or bleeding.   5.  Return to clinic in 3 mos.    Edrick Kins, DPM [facility]  Dr. Edrick Kins, [address]                                        [place], [place] [address]                Office [phone]  Fax [phone]

## 2019-01-09 ENCOUNTER — Encounter: Payer: Self-pay | Admitting: Cardiology

## 2019-01-09 NOTE — Progress Notes (Signed)
Remote pacemaker transmission.   

## 2019-01-16 DIAGNOSIS — R21 Rash and other nonspecific skin eruption: Secondary | ICD-10-CM | POA: Diagnosis not present

## 2019-01-16 DIAGNOSIS — J300 Vasomotor rhinitis: Secondary | ICD-10-CM | POA: Diagnosis not present

## 2019-01-16 DIAGNOSIS — H1045 Other chronic allergic conjunctivitis: Secondary | ICD-10-CM | POA: Diagnosis not present

## 2019-01-16 DIAGNOSIS — J4530 Mild persistent asthma, uncomplicated: Secondary | ICD-10-CM | POA: Diagnosis not present

## 2019-02-01 ENCOUNTER — Other Ambulatory Visit: Payer: Self-pay | Admitting: Cardiology

## 2019-02-06 ENCOUNTER — Other Ambulatory Visit: Payer: Self-pay | Admitting: Internal Medicine

## 2019-02-21 ENCOUNTER — Encounter: Payer: Self-pay | Admitting: Internal Medicine

## 2019-02-21 ENCOUNTER — Ambulatory Visit (INDEPENDENT_AMBULATORY_CARE_PROVIDER_SITE_OTHER): Payer: PPO | Admitting: Internal Medicine

## 2019-02-21 ENCOUNTER — Other Ambulatory Visit: Payer: Self-pay

## 2019-02-21 DIAGNOSIS — I495 Sick sinus syndrome: Secondary | ICD-10-CM | POA: Diagnosis not present

## 2019-02-21 DIAGNOSIS — I480 Paroxysmal atrial fibrillation: Secondary | ICD-10-CM | POA: Diagnosis not present

## 2019-02-21 DIAGNOSIS — F0150 Vascular dementia without behavioral disturbance: Secondary | ICD-10-CM | POA: Diagnosis not present

## 2019-02-21 DIAGNOSIS — N183 Chronic kidney disease, stage 3 unspecified: Secondary | ICD-10-CM

## 2019-02-21 NOTE — Assessment & Plan Note (Signed)
Doing well now with family support and daily aide On the donepezil--unclear if it helps

## 2019-02-21 NOTE — Assessment & Plan Note (Signed)
Paced rhythm apparently No symptoms

## 2019-02-21 NOTE — Assessment & Plan Note (Signed)
GFR in 40's Is on ARB

## 2019-02-21 NOTE — Progress Notes (Signed)
Subjective:    Patient ID: Leah Hayden, female    DOB: 08/07/1928, 85 y.o.   MRN: [id number]  HPI Here with [name] for follow up of chronic health conditions Has been doing okay  Has awakened feel weak and decreased energy Family is promoting fluids--seems to help after a while DIL fixes her meds--and calls her at night to remind her about the meds Has aide still from 8-3 daily She is in her own home Family mostly prepares her food Aide gives stand by assistance for showers (and she uses chair) Goes to bathroom Some night urinary incontinence---uses pad then Uses cane most of the time---always when outside, and often insides  No chest pain No SOB Will have slight "tingle" in chest---not really a palpitation No edema Sometimes is dizzy upon arising--takes it slow  Reviewed labs GFR in 40's  Current Outpatient Medications on File Prior to Visit  Medication Sig Dispense Refill  . albuterol (PROVENTIL HFA) 108 (90 BASE) MCG/ACT inhaler Inhale 2 puffs into the lungs every 6 (six) hours as needed for wheezing or shortness of breath.     . cyanocobalamin 500 MCG tablet Take 500 mcg by mouth every other day.     . donepezil (ARICEPT) 5 MG tablet TAKE 1 TABLET (5 MG TOTAL) BY MOUTH AT BEDTIME. 90 tablet 3  . Fluticasone-Salmeterol (ADVAIR DISKUS) 100-50 MCG/DOSE AEPB Inhale 1 puff into the lungs daily as needed (shortness of breath or wheezing).     . hydroxypropyl methylcellulose / hypromellose (ISOPTO TEARS / GONIOVISC) 2.5 % ophthalmic solution Place 1 drop into both eyes 3 (three) times daily as needed for dry eyes.    Marland Kitchen losartan (COZAAR) 25 MG tablet TAKE 2 TABLETS BY MOUTH EVERY DAY 180 tablet 3  . montelukast (SINGULAIR) 10 MG tablet Take 10 mg by mouth every evening.     . Multiple Vitamins-Minerals (CENTRUM SILVER 50+WOMEN) TABS Take 1 tablet by mouth daily.    . nitroGLYCERIN (NITROSTAT) 0.4 MG SL tablet Place 1 tablet (0.4 mg total) under the tongue every 5 (five)  minutes as needed for chest pain. 25 tablet 1  . pantoprazole (PROTONIX) 40 MG tablet Take 1 tablet (40 mg total) by mouth daily. 90 tablet 3  . Rivaroxaban (XARELTO) 15 MG TABS tablet Take 1 tablet (15 mg total) by mouth daily with supper. 90 tablet 3  . VITAMIN D PO Take 5,000 Units by mouth daily.     Marland Kitchen diltiazem (CARDIZEM) 30 MG tablet Take 1 tablet (30 mg total) by mouth every 8 (eight) hours for 30 days. 270 tablet 2   No current facility-administered medications on file prior to visit.     Allergies  Allergen Reactions  . Amiodarone Other (See Comments)    Chest discomfort  . Eliquis [Apixaban] Rash  . Other Rash    oramycin    Past Medical History:  Diagnosis Date  . Allergic rhinitis   . Asthma   . Biceps tendon tear    a. right->s/p surgical repair winter of 2014.  . Diverticulosis of colon   . GERD (gastroesophageal reflux disease)   . Hammer toe of right foot   . Hx of colonic polyps   . HX: breast cancer   . Meniere's disease   . NSTEMI (non-ST elevated myocardial infarction) ([place])    a. 11/2012 in setting of rapid afib;  b. 11/2012 Cath: nl EF, nonobs CAD->Med Rx;  b. 11/2012 Echo: EF 60-65%, mild LVH, mild MR/TR.  Marland Kitchen  Osteoarthritis   . PAF (paroxysmal atrial fibrillation) ([place])    a. Eliquis and amio initiated 11/2012 in setting of admission with RVR and NSTEMI;  b. Subsequently changed to flecainide and xarelto;  c. 11/2013 Echo: EF 60-65%, mild LVH, mod MR/TR.  Marland Kitchen Sinus brady-tachy syndrome ([place])    a. Post-conversion pause of 7.2 seconds during 11/2012 hospitalization @ [facility]; b. 04/2017 Event Monitor: No significant arrhythmias.    Past Surgical History:  Procedure Laterality Date  . ABDOMINAL HYSTERECTOMY    . CARDIAC CATHETERIZATION  11/2013   [facility]  . [place]   2nd to left toe  . ESOPHAGOGASTRODUODENOSCOPY  10/1999   inflammation only   . FOREIGN BODY REMOVAL Right 01/02/2013   Procedure: FOREIGN BODY REMOVAL RIGHT INDEX FINGER;  Surgeon:  Cammie Sickle., MD;  Location: [facility];  Service: Orthopedics;  Laterality: Right;  . HAMMER TOE SURGERY  11/15   Dr Milinda Pointer  . MASTECTOMY  1984   left   . MASTECTOMY  1985   right  . PACEMAKER IMPLANT N/A 09/20/2018   Procedure: PACEMAKER IMPLANT;  Surgeon: Deboraha Sprang, MD;  Location: [facility];  Service: Cardiovascular;  Laterality: N/A;  . PARTIAL HYSTERECTOMY  1966   w/ bladder tack  . ROTATOR CUFF REPAIR  10/09   right   . TOE SURGERY  2001/2002   2nd/3rd,left     Family History  Problem Relation Age of Onset  . Heart failure Mother   . Gout Mother   . Heart disease Mother        heart failure  . Arthritis Mother   . Heart failure Father   . Heart disease Father        heart failure  . Diabetes Maternal Grandmother   . Colon cancer Maternal Grandfather   . Cancer Maternal Grandfather        colon  . Arthritis Brother     Social History   Socioeconomic History  . Marital status: Widowed    Spouse name: Not on file  . Number of children: 2  . Years of education: Not on file  . Highest education level: Not on file  Occupational History  . 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 Financial resource strain: Not on file  . Food insecurity    Worry: Not on file    Inability: Not on file  . Transportation needs    Medical: Not on file    Non-medical: Not on file  Tobacco Use  . Smoking status: Never Smoker  . Smokeless tobacco: Never Used  Substance and Sexual Activity  . Alcohol use: No    Alcohol/week: 0.0 standard drinks  . Drug use: No  . Sexual activity: Not on file  Lifestyle  . Physical activity    Days per week: Not on file    Minutes per session: Not on file  . Stress: Not on file  Relationships  . Social Herbalist on phone: Not on file    Gets together: Not on file    Attends religious service: Not on file    Active member of club or organization: Not on file     Attends meetings of clubs or organizations: Not on file    Relationship status: Not on file  . Intimate partner violence    Fear of current or ex partner: Not on file  Emotionally abused: Not on file    Physically abused: Not on file    Forced sexual activity: Not on file  Other Topics Concern  . Not on file  Social History Narrative   Now has living will   Sons are health care POAs   Has DNR   No tube feeds if cognitively unaware   Review of Systems Eating okay Weight is stable Sleeps well    Objective:   Physical Exam  Constitutional: She appears well-developed. No distress.  Neck: No thyromegaly present.  Cardiovascular: Normal rate and regular rhythm. Exam reveals no gallop.  Soft systolic murmur at LLSB  Respiratory: Effort normal and breath sounds normal. No respiratory distress. She has no wheezes. She has no rales.  Musculoskeletal:        General: No edema.  Lymphadenopathy:    She has no cervical adenopathy.  Psychiatric: She has a normal mood and affect. Her behavior is normal.           Assessment & Plan:

## 2019-02-21 NOTE — Assessment & Plan Note (Signed)
No symptomatic tachycardia on diltiazem On the xarelto

## 2019-03-25 ENCOUNTER — Other Ambulatory Visit: Payer: Self-pay

## 2019-03-25 ENCOUNTER — Encounter: Payer: Self-pay | Admitting: Podiatry

## 2019-03-25 ENCOUNTER — Ambulatory Visit: Payer: PPO | Admitting: Podiatry

## 2019-03-25 VITALS — Temp 97.8°F

## 2019-03-25 DIAGNOSIS — B351 Tinea unguium: Secondary | ICD-10-CM | POA: Diagnosis not present

## 2019-03-25 DIAGNOSIS — M79676 Pain in unspecified toe(s): Secondary | ICD-10-CM | POA: Diagnosis not present

## 2019-03-25 DIAGNOSIS — L989 Disorder of the skin and subcutaneous tissue, unspecified: Secondary | ICD-10-CM | POA: Diagnosis not present

## 2019-03-28 NOTE — Progress Notes (Signed)
   SUBJECTIVE Patient presents to office today complaining of elongated, thickened nails that cause pain while ambulating in shoes. She is unable to trim her own nails. She is also here for follow up evaluation of painful callus lesions noted to the bilateral feet. Walking and bearing weight increases the pain. She has not had any recent treatment for the lesions. Patient is here for further evaluation and treatment.  Past Medical History:  Diagnosis Date  . Allergic rhinitis   . Asthma   . Biceps tendon tear    a. right->s/p surgical repair winter of 2014.  . Diverticulosis of colon   . GERD (gastroesophageal reflux disease)   . Hammer toe of right foot   . Hx of colonic polyps   . HX: breast cancer   . Meniere's disease   . NSTEMI (non-ST elevated myocardial infarction) ([place])    a. 11/2012 in setting of rapid afib;  b. 11/2012 Cath: nl EF, nonobs CAD->Med Rx;  b. 11/2012 Echo: EF 60-65%, mild LVH, mild MR/TR.  . Osteoarthritis   . PAF (paroxysmal atrial fibrillation) ([place])    a. Eliquis and amio initiated 11/2012 in setting of admission with RVR and NSTEMI;  b. Subsequently changed to flecainide and xarelto;  c. 11/2013 Echo: EF 60-65%, mild LVH, mod MR/TR.  Marland Kitchen Sinus brady-tachy syndrome ([place])    a. Post-conversion pause of 7.2 seconds during 11/2012 hospitalization @ [facility]; b. 04/2017 Event Monitor: No significant arrhythmias.    OBJECTIVE General Patient is awake, alert, and oriented x 3 and in no acute distress. Derm Skin is dry and supple bilateral. Nails are tender, long, thickened and dystrophic with subungual debris, consistent with onychomycosis, 1-5 bilateral. No signs of infection noted.  There is some hyperkeratotic callus tissue noted to bilateral feet Vasc  DP and PT pedal pulses palpable bilaterally. Temperature gradient within normal limits.  Neuro Epicritic and protective threshold sensation grossly intact bilaterally.  Musculoskeletal Exam No symptomatic pedal deformities  noted bilateral. Muscular strength within normal limits.  ASSESSMENT 1. Onychodystrophic nails 1-5 bilateral with hyperkeratosis of nails.  2. Onychomycosis of nail due to dermatophyte bilateral 3. Symptomatic callus lesions bilateral feet  PLAN OF CARE 1. Patient evaluated today.  2. Instructed to maintain good pedal hygiene and foot care.  3. Mechanical debridement of nails 1-5 bilaterally performed using a nail nipper. Filed with dremel without incident.  4. Excisional debridement of the callus lesions was performed using a tissue nipper without incident or bleeding.   5. Return to clinic in 3 mos.    Edrick Kins, DPM [facility]  Dr. Edrick Kins, [address]                                        [place], [place] [address]                Office [phone]  Fax [phone]

## 2019-04-01 ENCOUNTER — Ambulatory Visit (INDEPENDENT_AMBULATORY_CARE_PROVIDER_SITE_OTHER): Payer: PPO | Admitting: *Deleted

## 2019-04-01 DIAGNOSIS — I495 Sick sinus syndrome: Secondary | ICD-10-CM

## 2019-04-02 LAB — CUP PACEART REMOTE DEVICE CHECK
Date Time Interrogation Session: 20200819084951
Implantable Lead Implant Date: 20200207
Implantable Lead Implant Date: 20200207
Implantable Lead Location: 753859
Implantable Lead Location: 753860
Implantable Lead Model: 5076
Implantable Pulse Generator Implant Date: 20200207
Pulse Gen Model: 407145
Pulse Gen Serial Number: 69503042

## 2019-04-09 ENCOUNTER — Encounter: Payer: Self-pay | Admitting: Cardiology

## 2019-04-09 NOTE — Progress Notes (Signed)
Remote pacemaker transmission.   

## 2019-05-08 DIAGNOSIS — L570 Actinic keratosis: Secondary | ICD-10-CM | POA: Diagnosis not present

## 2019-05-08 DIAGNOSIS — Z08 Encounter for follow-up examination after completed treatment for malignant neoplasm: Secondary | ICD-10-CM | POA: Diagnosis not present

## 2019-05-08 DIAGNOSIS — D0462 Carcinoma in situ of skin of left upper limb, including shoulder: Secondary | ICD-10-CM | POA: Diagnosis not present

## 2019-05-08 DIAGNOSIS — Z85828 Personal history of other malignant neoplasm of skin: Secondary | ICD-10-CM | POA: Diagnosis not present

## 2019-05-08 DIAGNOSIS — L7211 Pilar cyst: Secondary | ICD-10-CM | POA: Diagnosis not present

## 2019-05-08 DIAGNOSIS — D485 Neoplasm of uncertain behavior of skin: Secondary | ICD-10-CM | POA: Diagnosis not present

## 2019-05-29 ENCOUNTER — Telehealth: Payer: Self-pay | Admitting: Internal Medicine

## 2019-05-29 NOTE — Telephone Encounter (Signed)
Spoke with [name], patient's daughter-in-law. Pt has had worsening dementia and can be challenging to assess her symptoms. Stated recently that her PPM site felt uncomfortable, "sticky" (pins and needles sensation?) and burning. Pt has not reported it frequently, but [name] is concerned as the PPM seems very prominent in her chest. She isn't sure if this is new, pt has very little subcutaneous fat. Reports incision appears well healed. [name] denies any signs/symptoms of infection, including drainage, redness, swelling, fever/chills. Easier for them to get to [facility] office, need mid-morning appointment or later. Offered 06/10/19 at 10:20am with Dr. Caryl Comes in [place]. [name] accepted this appointment, agrees to call back in the interim for any worsening pain or signs of infection, aware pt will need to come to [facility] for sooner appointment if this is the case. Direct DC number given. No further questions at this time.

## 2019-05-29 NOTE — Telephone Encounter (Signed)
The pt daughter in law states the pt complaining that her ppm is slightly sticky and burning. I asked if the ppm site have any color to it and she states it do not. I asked her did it feel warm to the touch and she states she did not push down on it. The pt monitor did update in [employer]. I told her I will have the nurse give her a call back as soon as possible. The best number to call Leah Hayden at [phone].

## 2019-05-29 NOTE — Telephone Encounter (Signed)
Patient daughter in law states patient has relayed her pacemaker feel "sticky and burning". States this has been going on for a while. Please call to discuss.

## 2019-06-05 ENCOUNTER — Ambulatory Visit (INDEPENDENT_AMBULATORY_CARE_PROVIDER_SITE_OTHER): Payer: PPO

## 2019-06-05 DIAGNOSIS — Z23 Encounter for immunization: Secondary | ICD-10-CM | POA: Diagnosis not present

## 2019-06-06 ENCOUNTER — Telehealth: Payer: Self-pay

## 2019-06-06 NOTE — Telephone Encounter (Signed)
Pt daughter in law states she thinks the pt may have slept wrong and that why she was feeling like she was feeling. She do not want to bring the pt to the appointment. She states if the nurse thinks it would be best to come to the appointment then she will remake the appointment. I told her I cancelled the appointment. [name] thanked me for the help.

## 2019-06-10 ENCOUNTER — Encounter: Payer: PPO | Admitting: Internal Medicine

## 2019-06-24 DIAGNOSIS — D0472 Carcinoma in situ of skin of left lower limb, including hip: Secondary | ICD-10-CM | POA: Diagnosis not present

## 2019-06-24 DIAGNOSIS — L570 Actinic keratosis: Secondary | ICD-10-CM | POA: Diagnosis not present

## 2019-07-01 ENCOUNTER — Ambulatory Visit (INDEPENDENT_AMBULATORY_CARE_PROVIDER_SITE_OTHER): Payer: PPO | Admitting: *Deleted

## 2019-07-01 ENCOUNTER — Other Ambulatory Visit: Payer: Self-pay

## 2019-07-01 ENCOUNTER — Ambulatory Visit: Payer: PPO | Admitting: Podiatry

## 2019-07-01 DIAGNOSIS — I495 Sick sinus syndrome: Secondary | ICD-10-CM

## 2019-07-01 DIAGNOSIS — M79676 Pain in unspecified toe(s): Secondary | ICD-10-CM | POA: Diagnosis not present

## 2019-07-01 DIAGNOSIS — L989 Disorder of the skin and subcutaneous tissue, unspecified: Secondary | ICD-10-CM | POA: Diagnosis not present

## 2019-07-01 DIAGNOSIS — B351 Tinea unguium: Secondary | ICD-10-CM

## 2019-07-01 DIAGNOSIS — I480 Paroxysmal atrial fibrillation: Secondary | ICD-10-CM | POA: Diagnosis not present

## 2019-07-02 LAB — CUP PACEART REMOTE DEVICE CHECK
Date Time Interrogation Session: 20201118094128
Implantable Lead Implant Date: 20200207
Implantable Lead Implant Date: 20200207
Implantable Lead Location: 753859
Implantable Lead Location: 753860
Implantable Lead Model: 5076
Implantable Pulse Generator Implant Date: 20200207
Pulse Gen Model: 407145
Pulse Gen Serial Number: 69503042

## 2019-07-03 NOTE — Progress Notes (Signed)
    Subjective: Patient is a 85 y.o. female presenting to the office today for follow up evaluation of painful callus lesion(s) noted to the bilateral feet. Walking and applying pressure increases the pain. She has not had any recent treatment.  Patient also complains of elongated, thickened nails that cause pain while ambulating in shoes. She is unable to trim her own nails. Patient presents today for further treatment and evaluation.  Past Medical History:  Diagnosis Date  . Allergic rhinitis   . Asthma   . Biceps tendon tear    a. right->s/p surgical repair winter of 2014.  . Diverticulosis of colon   . GERD (gastroesophageal reflux disease)   . Hammer toe of right foot   . Hx of colonic polyps   . HX: breast cancer   . Meniere's disease   . NSTEMI (non-ST elevated myocardial infarction) ([place])    a. 11/2012 in setting of rapid afib;  b. 11/2012 Cath: nl EF, nonobs CAD->Med Rx;  b. 11/2012 Echo: EF 60-65%, mild LVH, mild MR/TR.  . Osteoarthritis   . PAF (paroxysmal atrial fibrillation) ([place])    a. Eliquis and amio initiated 11/2012 in setting of admission with RVR and NSTEMI;  b. Subsequently changed to flecainide and xarelto;  c. 11/2013 Echo: EF 60-65%, mild LVH, mod MR/TR.  Marland Kitchen Sinus brady-tachy syndrome ([place])    a. Post-conversion pause of 7.2 seconds during 11/2012 hospitalization @ [facility]; b. 04/2017 Event Monitor: No significant arrhythmias.    Objective:  Physical Exam General: Alert and oriented x3 in no acute distress  Dermatology: Hyperkeratotic lesion(s) present on the bilateral feet. Pain on palpation with a central nucleated core noted. Skin is warm, dry and supple bilateral lower extremities. Negative for open lesions or macerations. Nails are tender, long, thickened and dystrophic with subungual debris, consistent with onychomycosis, 1-5 bilateral. No signs of infection noted.  Vascular: Palpable pedal pulses bilaterally. No edema or erythema noted. Capillary refill within  normal limits.  Neurological: Epicritic and protective threshold grossly intact bilaterally.   Musculoskeletal Exam: Pain on palpation at the keratotic lesion(s) noted. Range of motion within normal limits bilateral. Muscle strength 5/5 in all groups bilateral.  Assessment: 1. Onychodystrophic nails 1-5 bilateral with hyperkeratosis of nails.  2. Onychomycosis of nail due to dermatophyte bilateral 3. Pre-ulcerative callus lesions noted to the bilateral feet x 2   Plan of Care:  1. Patient evaluated. 2. Excisional debridement of keratoic lesion(s) using a chisel blade was performed without incident.  3. Dressed with light dressing. 4. Mechanical debridement of nails 1-5 bilaterally performed using a nail nipper. Filed with dremel without incident.  5. Patient is to return to the clinic in 3 months.   Edrick Kins, DPM [facility]  Dr. Edrick Kins, [address]                                        [place], [place] [address]                Office [phone]  Fax [phone]

## 2019-07-25 ENCOUNTER — Other Ambulatory Visit: Payer: Self-pay

## 2019-07-25 ENCOUNTER — Encounter: Payer: Self-pay | Admitting: Family Medicine

## 2019-07-25 ENCOUNTER — Ambulatory Visit (INDEPENDENT_AMBULATORY_CARE_PROVIDER_SITE_OTHER): Payer: PPO | Admitting: Family Medicine

## 2019-07-25 VITALS — BP 160/72 | HR 65 | Temp 98.4°F | Ht 64.0 in | Wt 154.3 lb

## 2019-07-25 DIAGNOSIS — F0150 Vascular dementia without behavioral disturbance: Secondary | ICD-10-CM

## 2019-07-25 DIAGNOSIS — W19XXXA Unspecified fall, initial encounter: Secondary | ICD-10-CM

## 2019-07-25 DIAGNOSIS — Y92009 Unspecified place in unspecified non-institutional (private) residence as the place of occurrence of the external cause: Secondary | ICD-10-CM | POA: Diagnosis not present

## 2019-07-25 DIAGNOSIS — I480 Paroxysmal atrial fibrillation: Secondary | ICD-10-CM

## 2019-07-25 DIAGNOSIS — R443 Hallucinations, unspecified: Secondary | ICD-10-CM | POA: Diagnosis not present

## 2019-07-25 DIAGNOSIS — R410 Disorientation, unspecified: Secondary | ICD-10-CM

## 2019-07-25 DIAGNOSIS — R600 Localized edema: Secondary | ICD-10-CM

## 2019-07-25 DIAGNOSIS — R8290 Unspecified abnormal findings in urine: Secondary | ICD-10-CM | POA: Diagnosis not present

## 2019-07-25 LAB — COMPREHENSIVE METABOLIC PANEL
ALT: 14 U/L (ref 0–35)
AST: 17 U/L (ref 0–37)
Albumin: 3.9 g/dL (ref 3.5–5.2)
Alkaline Phosphatase: 71 U/L (ref 39–117)
BUN: 21 mg/dL (ref 6–23)
CO2: 31 mEq/L (ref 19–32)
Calcium: 9.5 mg/dL (ref 8.4–10.5)
Chloride: 102 mEq/L (ref 96–112)
Creatinine, Ser: 1.13 mg/dL (ref 0.40–1.20)
GFR: 45.29 mL/min — ABNORMAL LOW (ref >60.00)
Glucose, Bld: 103 mg/dL — ABNORMAL HIGH (ref 70–99)
Potassium: 4.3 mEq/L (ref 3.5–5.1)
Sodium: 139 mEq/L (ref 135–145)
Total Bilirubin: 0.9 mg/dL (ref 0.2–1.2)
Total Protein: 6.6 g/dL (ref 6.0–8.3)

## 2019-07-25 LAB — POC URINALSYSI DIPSTICK (AUTOMATED)
Bilirubin, UA: NEGATIVE
Blood, UA: NEGATIVE
Glucose, UA: NEGATIVE
Ketones, UA: NEGATIVE
Nitrite, UA: NEGATIVE
Protein, UA: NEGATIVE
Spec Grav, UA: 1.015 (ref 1.010–1.025)
Urobilinogen, UA: 0.2 E.U./dL
pH, UA: 6 (ref 5.0–8.0)

## 2019-07-25 LAB — CBC WITH DIFFERENTIAL/PLATELET
Basophils Absolute: 0 10*3/uL (ref 0.0–0.1)
Basophils Relative: 0.3 % (ref 0.0–3.0)
Eosinophils Absolute: 0.1 10*3/uL (ref 0.0–0.7)
Eosinophils Relative: 1.8 % (ref 0.0–5.0)
HCT: 36.7 % (ref 36.0–46.0)
Hemoglobin: 12.3 g/dL (ref 12.0–15.0)
Lymphocytes Relative: 22.6 % (ref 12.0–46.0)
Lymphs Abs: 1.4 10*3/uL (ref 0.7–4.0)
MCHC: 33.5 g/dL (ref 30.0–36.0)
MCV: 93.7 fl (ref 78.0–100.0)
Monocytes Absolute: 0.5 10*3/uL (ref 0.1–1.0)
Monocytes Relative: 8 % (ref 3.0–12.0)
Neutro Abs: 4.1 10*3/uL (ref 1.4–7.7)
Neutrophils Relative %: 67.3 % (ref 43.0–77.0)
Platelets: 202 10*3/uL (ref 150.0–400.0)
RBC: 3.91 Mil/uL (ref 3.87–5.11)
RDW: 13.7 % (ref 11.5–15.5)
WBC: 6.1 10*3/uL (ref 4.0–10.5)

## 2019-07-25 LAB — BRAIN NATRIURETIC PEPTIDE: Pro B Natriuretic peptide (BNP): 77 pg/mL (ref 0.0–100.0)

## 2019-07-25 LAB — TSH: TSH: 1.4 u[IU]/mL (ref 0.35–4.50)

## 2019-07-25 NOTE — Progress Notes (Signed)
Subjective:    Patient ID: Leah Hayden, female    DOB: [birth year], 85 y.o.   MRN: [id number]  This visit occurred during the SARS-CoV-2 public health emergency.  Safety protocols were in place, including screening questions prior to the visit, additional usage of staff PPE, and extensive cleaning of exam room while observing appropriate contact time as indicated for disinfecting solutions.    HPI 86 yo pt of Dr [name] here with h/o a fall /weakness and sleeping problems (seeing things at night)  She has a h/o vascular dementia  Taking xarelto-has a bruise from her fall   Wt Readings from Last 3 Encounters:  07/25/19 154 lb 5 oz (70 kg)  02/21/19 147 lb (66.7 kg)  01/02/19 147 lb (66.7 kg)  ? If wt gain from fluid or less activity  She eats what she wants to eat  Also some ensure drinks , also some sausage/bacon for bkfast  26.49 kg/m   Monday-had a bad day More confused than normal  bp was up a little bit but came down to normal  ? Dehydrated-gatorade helped   Tuesday -not as bad   Memory problems are generally getting worse  She has family and a sitter  154/93 -then 133/67 She can get anxious at times -especially when checking blood pressure   Slept most of the day yesterday  Appetite was ok  Feet/ankles and abdomen may be a bit more swollen   Hallucinations at night - thinks she is not home or that furniture looks different  Not during the day    BP is elevated today BP Readings from Last 3 Encounters:  07/25/19 (!) 160/72  02/21/19 120/78  01/02/19 130/60   Pulse Readings from Last 3 Encounters:  07/25/19 65  02/21/19 60  11/26/18 63   CKD 3 Lab Results  Component Value Date   CREATININE 1.05 (H) 11/20/2018   BUN 25 (H) 11/20/2018   NA 142 11/20/2018   K 4.3 11/20/2018   CL 112 (H) 11/20/2018   CO2 26 11/20/2018   Has had utis in the past   Today trace leuk Results for orders placed or performed in visit on 07/25/19  POCT Urinalysis Dipstick  (Automated)  Result Value Ref Range   Color, UA Yellow    Clarity, UA Clear    Glucose, UA Negative Negative   Bilirubin, UA Negative    Ketones, UA Negative    Spec Grav, UA 1.015 1.010 - 1.025   Blood, UA Negative    pH, UA 6.0 5.0 - 8.0   Protein, UA Negative Negative   Urobilinogen, UA 0.2 0.2 or 1.0 E.U./dL   Nitrite, UA Negative    Leukocytes, UA Trace (A) Negative      Patient Active Problem List   Diagnosis Date Noted  . Confusion 07/25/2019  . Fall at home 07/25/2019  . Hallucinations 07/25/2019  . Pedal edema 07/25/2019  . Abnormal finding on urinalysis 07/25/2019  . Sinus node dysfunction ([place]) 09/20/2018  . Demand ischemia ([place])   . Elevated troponin 07/25/2018  . Mood disorder ([place]) 06/26/2018  . Chronic renal disease, stage III 06/26/2018  . Arrhythmia 04/12/2018  . Vitamin B12 deficiency 11/10/2015  . Vascular dementia, uncomplicated ([place]) 123456  . Advance directive discussed with patient 06/07/2015  . Hyperlipidemia 05/13/2014  . PAF (paroxysmal atrial fibrillation) ([place])   . Routine general medical examination at a health care facility 05/03/2012  . Atrial fibrillation with RVR ([place]) 01/27/2010  . SVT/ PSVT/  PAT 01/10/2010  . Sick sinus syndrome ([place]) 01/10/2010  . Generalized osteoarthrosis, involving multiple sites 12/18/2007  . ALLERGIC RHINITIS 03/07/2007  . Mild intermittent asthma in adult without complication 123456  . GERD 03/07/2007  . DIVERTICULOSIS, COLON 03/07/2007  . BREAST CANCER, HX OF 03/07/2007   Past Medical History:  Diagnosis Date  . Allergic rhinitis   . Asthma   . Biceps tendon tear    a. right->s/p surgical repair winter of 2014.  . Diverticulosis of colon   . GERD (gastroesophageal reflux disease)   . Hammer toe of right foot   . Hx of colonic polyps   . HX: breast cancer   . Meniere's disease   . NSTEMI (non-ST elevated myocardial infarction) ([place])    a. 11/2012 in setting of rapid afib;  b. 11/2012 Cath: nl  EF, nonobs CAD->Med Rx;  b. 11/2012 Echo: EF 60-65%, mild LVH, mild MR/TR.  . Osteoarthritis   . PAF (paroxysmal atrial fibrillation) ([place])    a. Eliquis and amio initiated 11/2012 in setting of admission with RVR and NSTEMI;  b. Subsequently changed to flecainide and xarelto;  c. 11/2013 Echo: EF 60-65%, mild LVH, mod MR/TR.  Marland Kitchen Sinus brady-tachy syndrome ([place])    a. Post-conversion pause of 7.2 seconds during 11/2012 hospitalization @ [facility]; b. 04/2017 Event Monitor: No significant arrhythmias.   Past Surgical History:  Procedure Laterality Date  . ABDOMINAL HYSTERECTOMY    . CARDIAC CATHETERIZATION  11/2013   [facility]  . [place]   2nd to left toe  . ESOPHAGOGASTRODUODENOSCOPY  10/1999   inflammation only   . FOREIGN BODY REMOVAL Right 01/02/2013   Procedure: FOREIGN BODY REMOVAL RIGHT INDEX FINGER;  Surgeon: Cammie Sickle., MD;  Location: [facility];  Service: Orthopedics;  Laterality: Right;  . HAMMER TOE SURGERY  11/15   Dr Milinda Pointer  . MASTECTOMY  1984   left   . MASTECTOMY  1985   right  . PACEMAKER IMPLANT N/A 09/20/2018   Procedure: PACEMAKER IMPLANT;  Surgeon: Deboraha Sprang, MD;  Location: [place] [facility];  Service: Cardiovascular;  Laterality: N/A;  . PARTIAL HYSTERECTOMY  1966   w/ bladder tack  . ROTATOR CUFF REPAIR  10/09   right   . TOE SURGERY  2001/2002   2nd/3rd,left    Social History   Tobacco Use  . Smoking status: Never Smoker  . Smokeless tobacco: Never Used  Substance Use Topics  . Alcohol use: No    Alcohol/week: 0.0 standard drinks  . Drug use: No   Family History  Problem Relation Age of Onset  . Heart failure Mother   . Gout Mother   . Heart disease Mother        heart failure  . Arthritis Mother   . Heart failure Father   . Heart disease Father        heart failure  . Diabetes Maternal Grandmother   . Colon cancer Maternal Grandfather   . Cancer Maternal Grandfather        colon  . Arthritis Brother     Allergies  Allergen Reactions  . Amiodarone Other (See Comments)    Chest discomfort  . Eliquis [Apixaban] Rash  . Other Rash    oramycin   Current Outpatient Medications on File Prior to Visit  Medication Sig Dispense Refill  . albuterol (PROVENTIL HFA) 108 (90 BASE) MCG/ACT inhaler Inhale 2 puffs into the lungs every 6 (six) hours as  needed for wheezing or shortness of breath.     . cyanocobalamin 500 MCG tablet Take 500 mcg by mouth every other day.     . diltiazem (CARDIZEM) 30 MG tablet Take 1 tablet (30 mg total) by mouth every 8 (eight) hours for 30 days. (Patient taking differently: Take 30 mg by mouth 3 (three) times daily. ) 270 tablet 2  . donepezil (ARICEPT) 5 MG tablet TAKE 1 TABLET (5 MG TOTAL) BY MOUTH AT BEDTIME. 90 tablet 3  . Fluticasone-Salmeterol (ADVAIR DISKUS) 100-50 MCG/DOSE AEPB Inhale 1 puff into the lungs daily as needed (shortness of breath or wheezing).     . hydroxypropyl methylcellulose / hypromellose (ISOPTO TEARS / GONIOVISC) 2.5 % ophthalmic solution Place 1 drop into both eyes 3 (three) times daily as needed for dry eyes.    Marland Kitchen losartan (COZAAR) 25 MG tablet TAKE 2 TABLETS BY MOUTH EVERY DAY 180 tablet 3  . montelukast (SINGULAIR) 10 MG tablet Take 10 mg by mouth every evening.     . Multiple Vitamins-Minerals (CENTRUM SILVER 50+WOMEN) TABS Take 1 tablet by mouth daily.    . nitroGLYCERIN (NITROSTAT) 0.4 MG SL tablet Place 1 tablet (0.4 mg total) under the tongue every 5 (five) minutes as needed for chest pain. 25 tablet 1  . pantoprazole (PROTONIX) 40 MG tablet Take 1 tablet (40 mg total) by mouth daily. 90 tablet 3  . Rivaroxaban (XARELTO) 15 MG TABS tablet Take 1 tablet (15 mg total) by mouth daily with supper. 90 tablet 3  . VITAMIN D PO Take 5,000 Units by mouth daily.      No current facility-administered medications on file prior to visit.    Review of Systems  Constitutional: Positive for fatigue. Negative for activity change, appetite change,  diaphoresis, fever and unexpected weight change.  HENT: Negative for congestion, ear pain, rhinorrhea, sinus pressure and sore throat.   Eyes: Negative for pain, redness and visual disturbance.  Respiratory: Negative for cough, shortness of breath and wheezing.   Cardiovascular: Positive for leg swelling. Negative for chest pain and palpitations.  Gastrointestinal: Negative for abdominal pain, blood in stool, constipation and diarrhea.  Endocrine: Negative for polydipsia and polyuria.  Genitourinary: Negative for dysuria, frequency and urgency.  Musculoskeletal: Negative for arthralgias, back pain and myalgias.  Skin: Negative for pallor and rash.  Allergic/Immunologic: Negative for environmental allergies.  Neurological: Negative for dizziness, syncope, facial asymmetry, speech difficulty, light-headedness and headaches.       No focal weakness but has felt more generally weak the last week  Hematological: Negative for adenopathy. Does not bruise/bleed easily.  Psychiatric/Behavioral: Positive for confusion. Negative for behavioral problems, decreased concentration and dysphoric mood. The patient is nervous/anxious.        Objective:   Physical Exam Constitutional:      General: She is not in acute distress.    Appearance: Normal appearance. She is well-developed and normal weight.     Comments: Frail appearing elderly female in wheelchair  HENT:     Head: Normocephalic and atraumatic.     Mouth/Throat:     Mouth: Mucous membranes are moist.  Eyes:     General: No scleral icterus.       Right eye: No discharge.        Left eye: No discharge.     Conjunctiva/sclera: Conjunctivae normal.     Pupils: Pupils are equal, round, and reactive to light.  Neck:     Thyroid: No thyromegaly.     Vascular:  No carotid bruit or JVD.  Cardiovascular:     Rate and Rhythm: Normal rate and regular rhythm.     Heart sounds: No gallop.   Pulmonary:     Effort: Pulmonary effort is normal. No  respiratory distress.     Breath sounds: Normal breath sounds. No wheezing or rales.     Comments: Good air exchange  No crackles Breathing comfortably Abdominal:     General: Bowel sounds are normal. There is no distension or abdominal bruit.     Palpations: Abdomen is soft. There is no mass.     Tenderness: There is no abdominal tenderness. There is no right CVA tenderness, left CVA tenderness, guarding or rebound.  Musculoskeletal:     Cervical back: Normal range of motion and neck supple. No tenderness.     Right lower leg: Edema present.     Left lower leg: Edema present.     Comments: Trace pitting edema bilat LE half way to knee   Mild kyphosis    Lymphadenopathy:     Cervical: No cervical adenopathy.  Skin:    General: Skin is warm and dry.     Coloration: Skin is not pale.     Findings: No erythema or rash.     Comments: Old /resolving ecchymosis on L upper arm and lower arm and L knee  No tenderness   Nl color and turgor  Brisk capillary refill No areas of skin breakdown  Neurological:     General: No focal deficit present.     Mental Status: She is alert.     Cranial Nerves: No cranial nerve deficit.     Sensory: No sensory deficit.     Coordination: Coordination normal.     Deep Tendon Reflexes: Reflexes are normal and symmetric.     Comments: Able to answer questions appropriately (with occ word searching) Did not observe gait/in wheelchair   Psychiatric:     Comments: C/o fatigue Pleasant  Affect is mildly blunted           Assessment & Plan:   Problem List Items Addressed This Visit      Cardiovascular and Mediastinum   PAF (paroxysmal atrial fibrillation) ([place])   Vascular dementia, uncomplicated ([place])    Per family more confused at night and starting to have some hallucinations at night (not during the day)  Continues to take aricept          Nervous and Auditory   Confusion - Primary    In pt with known vascular dementia- more than  baseline this past week with some fatigue/gen weakness ua- trace leuk and cx pending Labs today  Able to answer questions appropriately in the office today       Relevant Orders   POCT Urinalysis Dipstick (Automated) (Completed)   CBC w/Diff   Comprehensive metabolic panel   TSH     Other   Fall at home    Per pt- tripped in her basement and fell into the dryer Bruising seen on R arm and knee (resolving) No tenderness  Disc use of walker /supervision and fall prev in the home      Hallucinations    Pm only along with some confusion Clears in am  Suspect related to vascular dementia       Pedal edema    Per family more than usual this week  Is sitting more as well Wt is up today (no h/o CHF but does have cardiac issues)  BP  is elevated more than usual  No sob or cp  Labs today incl cmet and BNP       Relevant Orders   Comprehensive metabolic panel   Brain natriuretic peptide   Abnormal finding on urinalysis    Trace leukocytes More confused this week  Sent for cx       Relevant Orders   Urine Culture

## 2019-07-25 NOTE — Assessment & Plan Note (Addendum)
Per family more confused at night and starting to have some hallucinations at night (not during the day)  Continues to take aricept

## 2019-07-25 NOTE — Assessment & Plan Note (Addendum)
Per family more than usual this week  Is sitting more as well Wt is up today (no h/o CHF but does have cardiac issues)  BP is elevated more than usual  No sob or cp  Labs today incl cmet and BNP

## 2019-07-25 NOTE — Assessment & Plan Note (Signed)
In pt with known vascular dementia- more than baseline this past week with some fatigue/gen weakness ua- trace leuk and cx pending Labs today  Able to answer questions appropriately in the office today

## 2019-07-25 NOTE — Assessment & Plan Note (Signed)
Trace leukocytes More confused this week  Sent for cx

## 2019-07-25 NOTE — Patient Instructions (Signed)
Try to limit sodium intake and increase water   Labs today  Urine culture today    Elevate feet when sitting for swelling   Alert [place] if symptoms worsen or new ones develop   Discuss night time hallucinations and confusion further with Dr [name] if no improvement

## 2019-07-25 NOTE — Assessment & Plan Note (Signed)
Per pt- tripped in her basement and fell into the dryer Bruising seen on R arm and knee (resolving) No tenderness  Disc use of walker /supervision and fall prev in the home

## 2019-07-25 NOTE — Assessment & Plan Note (Signed)
Pm only along with some confusion Clears in am  Suspect related to vascular dementia

## 2019-07-26 ENCOUNTER — Other Ambulatory Visit: Payer: Self-pay

## 2019-07-26 ENCOUNTER — Encounter: Payer: Self-pay | Admitting: Emergency Medicine

## 2019-07-26 ENCOUNTER — Emergency Department: Payer: PPO

## 2019-07-26 ENCOUNTER — Emergency Department
Admission: EM | Admit: 2019-07-26 | Discharge: 2019-07-26 | Disposition: A | Discharge status: Home / Self Care | Payer: PPO | Attending: Emergency Medicine | Admitting: Emergency Medicine

## 2019-07-26 DIAGNOSIS — Z950 Presence of cardiac pacemaker: Secondary | ICD-10-CM | POA: Diagnosis not present

## 2019-07-26 DIAGNOSIS — J45909 Unspecified asthma, uncomplicated: Secondary | ICD-10-CM | POA: Insufficient documentation

## 2019-07-26 DIAGNOSIS — Z79899 Other long term (current) drug therapy: Secondary | ICD-10-CM | POA: Diagnosis not present

## 2019-07-26 DIAGNOSIS — R531 Weakness: Secondary | ICD-10-CM | POA: Diagnosis not present

## 2019-07-26 DIAGNOSIS — Z853 Personal history of malignant neoplasm of breast: Secondary | ICD-10-CM | POA: Insufficient documentation

## 2019-07-26 LAB — BASIC METABOLIC PANEL
Anion gap: 9 (ref 5–15)
BUN: 23 mg/dL (ref 8–23)
CO2: 27 mmol/L (ref 22–32)
Calcium: 9.8 mg/dL (ref 8.9–10.3)
Chloride: 101 mmol/L (ref 98–111)
Creatinine, Ser: 1.1 mg/dL — ABNORMAL HIGH (ref 0.44–1.00)
GFR calc Af Amer: 52 mL/min — ABNORMAL LOW (ref >60)
GFR calc non Af Amer: 44 mL/min — ABNORMAL LOW (ref >60)
Glucose, Bld: 121 mg/dL — ABNORMAL HIGH (ref 70–99)
Potassium: 4.6 mmol/L (ref 3.5–5.1)
Sodium: 137 mmol/L (ref 135–145)

## 2019-07-26 LAB — CBC
HCT: 41.3 % (ref 36.0–46.0)
Hemoglobin: 13.6 g/dL (ref 12.0–15.0)
MCH: 30.8 pg (ref 26.0–34.0)
MCHC: 32.9 g/dL (ref 30.0–36.0)
MCV: 93.7 fL (ref 80.0–100.0)
Platelets: 204 10*3/uL (ref 150–400)
RBC: 4.41 MIL/uL (ref 3.87–5.11)
RDW: 12.8 % (ref 11.5–15.5)
WBC: 6.6 10*3/uL (ref 4.0–10.5)
nRBC: 0 % (ref 0.0–0.2)

## 2019-07-26 LAB — TROPONIN I (HIGH SENSITIVITY): Troponin I (High Sensitivity): 4 ng/L (ref <18)

## 2019-07-26 MED ORDER — SODIUM CHLORIDE 0.9% FLUSH: 3.0000 mL | Freq: Once | INTRAVENOUS | Status: DC

## 2019-07-26 NOTE — Discharge Instructions (Addendum)
Please seek medical attention for any high fevers, chest pain, shortness of breath, change in behavior, persistent vomiting, bloody stool or any other new or concerning symptoms.  

## 2019-07-26 NOTE — ED Notes (Signed)
Dr goodman at bedside. 

## 2019-07-26 NOTE — ED Provider Notes (Signed)
[facility] Emergency Department Provider Note   ____________________________________________   I have reviewed the triage vital signs and the nursing notes.   HISTORY  Chief Complaint Weakness   History limited by and level 5 caveat due to: Dementia. Most history obtained from family   HPI Leah Hayden is a 85 y.o. female who presents to the emergency department today because of concern for difficulty with ambulation and weakness. Son states that she has had a bad week. It started on Monday of last week. They have noticed some increased confusion and weakness. The patient did have a fall Monday or Tuesday and hit her arm. Went to see PCP yesterday for concern for weakness and possible UTI (she has had similar symptoms with previous UTIs). No obvious etiology of the symptoms however still awaiting urine culture. This morning the patient was having a very difficult time with ambulation, just taking small steps, but primarily sitting in a chair. The patient says she has had some discomfort in her head over the past few days but denies any significant discomfort. No fevers.   Records reviewed. Per medical record review patient has a history of admission for weakness, afib and aki earlier this year.   Past Medical History:  Diagnosis Date  . Allergic rhinitis   . Asthma   . Biceps tendon tear    a. right->s/p surgical repair winter of 2014.  . Diverticulosis of colon   . GERD (gastroesophageal reflux disease)   . Hammer toe of right foot   . Hx of colonic polyps   . HX: breast cancer   . Meniere's disease   . NSTEMI (non-ST elevated myocardial infarction) ([place])    a. 11/2012 in setting of rapid afib;  b. 11/2012 Cath: nl EF, nonobs CAD->Med Rx;  b. 11/2012 Echo: EF 60-65%, mild LVH, mild MR/TR.  . Osteoarthritis   . PAF (paroxysmal atrial fibrillation) ([place])    a. Eliquis and amio initiated 11/2012 in setting of admission with RVR and NSTEMI;  b. Subsequently  changed to flecainide and xarelto;  c. 11/2013 Echo: EF 60-65%, mild LVH, mod MR/TR.  Marland Kitchen Sinus brady-tachy syndrome ([place])    a. Post-conversion pause of 7.2 seconds during 11/2012 hospitalization @ [facility]; b. 04/2017 Event Monitor: No significant arrhythmias.    Patient Active Problem List   Diagnosis Date Noted  . Confusion 07/25/2019  . Fall at home 07/25/2019  . Hallucinations 07/25/2019  . Pedal edema 07/25/2019  . Abnormal finding on urinalysis 07/25/2019  . Sinus node dysfunction ([place]) 09/20/2018  . Demand ischemia ([place])   . Elevated troponin 07/25/2018  . Mood disorder ([place]) 06/26/2018  . Chronic renal disease, stage III 06/26/2018  . Arrhythmia 04/12/2018  . Vitamin B12 deficiency 11/10/2015  . Vascular dementia, uncomplicated ([place]) 123456  . Advance directive discussed with patient 06/07/2015  . Hyperlipidemia 05/13/2014  . PAF (paroxysmal atrial fibrillation) ([place])   . Routine general medical examination at a health care facility 05/03/2012  . Atrial fibrillation with RVR ([place]) 01/27/2010  . SVT/ PSVT/ PAT 01/10/2010  . Sick sinus syndrome ([place]) 01/10/2010  . Generalized osteoarthrosis, involving multiple sites 12/18/2007  . ALLERGIC RHINITIS 03/07/2007  . Mild intermittent asthma in adult without complication 123456  . GERD 03/07/2007  . DIVERTICULOSIS, COLON 03/07/2007  . BREAST CANCER, HX OF 03/07/2007    Past Surgical History:  Procedure Laterality Date  . ABDOMINAL HYSTERECTOMY    . CARDIAC CATHETERIZATION  11/2013   [facility]  . CORRECTION HAMMER  TOE  1997   2nd to left toe  . ESOPHAGOGASTRODUODENOSCOPY  10/1999   inflammation only   . FOREIGN BODY REMOVAL Right 01/02/2013   Procedure: FOREIGN BODY REMOVAL RIGHT INDEX FINGER;  Surgeon: Cammie Sickle., MD;  Location: [facility];  Service: Orthopedics;  Laterality: Right;  . HAMMER TOE SURGERY  11/15   Dr Milinda Pointer  . MASTECTOMY  1984   left   . MASTECTOMY  1985   right  . PACEMAKER IMPLANT  N/A 09/20/2018   Procedure: PACEMAKER IMPLANT;  Surgeon: Deboraha Sprang, MD;  Location: [facility];  Service: Cardiovascular;  Laterality: N/A;  . PARTIAL HYSTERECTOMY  1966   w/ bladder tack  . ROTATOR CUFF REPAIR  10/09   right   . TOE SURGERY  2001/2002   2nd/3rd,left     Prior to Admission medications   Medication Sig Start Date End Date Taking? Authorizing Provider  albuterol (PROVENTIL HFA) 108 (90 BASE) MCG/ACT inhaler Inhale 2 puffs into the lungs every 6 (six) hours as needed for wheezing or shortness of breath.     [provider]  cyanocobalamin 500 MCG tablet Take 500 mcg by mouth every other day.     [provider]  diltiazem (CARDIZEM) 30 MG tablet Take 1 tablet (30 mg total) by mouth every 8 (eight) hours for 30 days. Patient taking differently: Take 30 mg by mouth 3 (three) times daily.  12/18/18 07/24/21  Minna Merritts, MD  donepezil (ARICEPT) 5 MG tablet TAKE 1 TABLET (5 MG TOTAL) BY MOUTH AT BEDTIME. 02/06/19   Venia Carbon, MD  Fluticasone-Salmeterol (ADVAIR DISKUS) 100-50 MCG/DOSE AEPB Inhale 1 puff into the lungs daily as needed (shortness of breath or wheezing).     [provider]  hydroxypropyl methylcellulose / hypromellose (ISOPTO TEARS / GONIOVISC) 2.5 % ophthalmic solution Place 1 drop into both eyes 3 (three) times daily as needed for dry eyes.    [provider]  losartan (COZAAR) 25 MG tablet TAKE 2 TABLETS BY MOUTH EVERY DAY 02/03/19   Minna Merritts, MD  montelukast (SINGULAIR) 10 MG tablet Take 10 mg by mouth every evening.     [provider]  Multiple Vitamins-Minerals (CENTRUM SILVER 50+WOMEN) TABS Take 1 tablet by mouth daily.    [provider]  nitroGLYCERIN (NITROSTAT) 0.4 MG SL tablet Place 1 tablet (0.4 mg total) under the tongue every 5 (five) minutes as needed for chest pain. 02/22/18   Minna Merritts, MD  pantoprazole (PROTONIX) 40 MG tablet Take 1 tablet (40 mg total) by  mouth daily. 08/01/18   Dunn, Areta Haber, PA-C  Rivaroxaban (XARELTO) 15 MG TABS tablet Take 1 tablet (15 mg total) by mouth daily with supper. 08/02/18   Minna Merritts, MD  VITAMIN D PO Take 5,000 Units by mouth daily.     [provider]    Allergies Amiodarone, Eliquis [apixaban], and Other  Family History  Problem Relation Age of Onset  . Heart failure Mother   . Gout Mother   . Heart disease Mother        heart failure  . Arthritis Mother   . Heart failure Father   . Heart disease Father        heart failure  . Diabetes Maternal Grandmother   . Colon cancer Maternal Grandfather   . Cancer Maternal Grandfather        colon  . Arthritis Brother  Social History Social History   Tobacco Use  . Smoking status: Never Smoker  . Smokeless tobacco: Never Used  Substance Use Topics  . Alcohol use: No    Alcohol/week: 0.0 standard drinks  . Drug use: No    Review of Systems Constitutional: No fever/chills Eyes: No visual changes. ENT: No sore throat. Cardiovascular: Denies chest pain. Respiratory: Denies shortness of breath. Gastrointestinal: No abdominal pain.  No nausea, no vomiting.  No diarrhea.   Genitourinary: Negative for dysuria. Musculoskeletal: Negative for back pain. Skin: Negative for rash. Neurological: Positive for headache.  ____________________________________________   PHYSICAL EXAM:  VITAL SIGNS: ED Triage Vitals  Enc Vitals Group     BP 07/26/19 1332 (!) 174/61     Pulse Rate 07/26/19 1332 64     Resp 07/26/19 1332 20     Temp 07/26/19 1332 98.6 F (37 C)     Temp Source 07/26/19 1332 Oral     SpO2 07/26/19 1332 100 %     Weight 07/26/19 1333 155 lb (70.3 kg)     Height 07/26/19 1333 5\' 4"  (1.626 m)     Head Circumference --      Peak Flow --      Pain Score 07/26/19 1333 0   Constitutional: Awake and alert. Not completely oriented to events.  Eyes: Conjunctivae are normal.  ENT      Head: Normocephalic and atraumatic.       Nose: No congestion/rhinnorhea.      Mouth/Throat: Mucous membranes are moist.      Neck: No stridor. Hematological/Lymphatic/Immunilogical: No cervical lymphadenopathy. Cardiovascular: Normal rate, regular rhythm.  No murmurs, rubs, or gallops.  Respiratory: Normal respiratory effort without tachypnea nor retractions. Breath sounds are clear and equal bilaterally. No wheezes/rales/rhonchi. Gastrointestinal: Soft and non tender. No rebound. No guarding.  Genitourinary: Deferred Musculoskeletal: Normal range of motion in all extremities. No lower extremity edema. Neurologic:  Normal speech and language. No gross focal neurologic deficits are appreciated.  Skin:  Skin is warm, dry and intact. No rash noted. Psychiatric: Mood and affect are normal. Speech and behavior are normal. Patient exhibits appropriate insight and judgment.  ____________________________________________    LABS (pertinent positives/negatives)  CBC wbc 6.6, hgb 13.6, plt 204 BMP wnl except glu 121, cr 1.10 Trop hs 4 ____________________________________________   EKG  I, Nance Pear, attending physician, personally viewed and interpreted this EKG  EKG Time: 1326 Rate: 61 Rhythm: atrial pacemaker Axis: normal Intervals: qtc 428 QRS: narrow, q waves v1, v2 ST changes: no st elevation Impression: abnormal ekg   ____________________________________________    RADIOLOGY  CT head No acute abnormality  ____________________________________________   PROCEDURES  Procedures  ____________________________________________   INITIAL IMPRESSION / ASSESSMENT AND PLAN / ED COURSE  Pertinent labs & imaging results that were available during my care of the patient were reviewed by me and considered in my medical decision making (see chart for details).   Patient presented to the emergency department today because of concern for weakness. Patient seen at PCPs office yesterday for same concern. Today  was slightly worse. On exam patient is awake and alert. Work up without any concerning blood work findings to explain weakness. Per medical record and family urine sent yesterday is awaiting culture results. No obvious etiology for the patient's weakness. Doubt stroke given waxing and waning quality. Did discuss importance of continued follow up and return precautions with patient and family.  ____________________________________________   FINAL CLINICAL IMPRESSION(S) / ED DIAGNOSES  Final  diagnoses:  Weakness     Note: This dictation was prepared with Dragon dictation. Any transcriptional errors that result from this process are unintentional     Nance Pear, MD 07/26/19 2031

## 2019-07-26 NOTE — ED Notes (Signed)
Green tube collected and sent to lab for troponin level

## 2019-07-26 NOTE — ED Triage Notes (Addendum)
Increasing weakness x 3 days. Denies fevers or cough. Denies pain. Arrives with son. Patient has some dementia but lives at home with help of aide during day. Lab work and urinalysis available on chart from office visit yesterday.

## 2019-07-26 NOTE — ED Notes (Signed)
2nd troponin not necessary

## 2019-07-27 ENCOUNTER — Telehealth: Payer: Self-pay | Admitting: Family Medicine

## 2019-07-27 LAB — URINE CULTURE
MICRO NUMBER:: 1189551
SPECIMEN QUALITY:: ADEQUATE

## 2019-07-27 MED ORDER — CEPHALEXIN 500 MG PO CAPS: 500.0000 mg | ORAL_CAPSULE | Freq: Two times a day (BID) | ORAL | 0 refills | Status: DC

## 2019-07-27 NOTE — Telephone Encounter (Signed)
I spoke to pt and her son  Also reviewed ED report from yesterday   Informed them that ucx is pos for klebsiella  Keflex sent to pharmacy on [address]   inst to update [place] if no improvement in her clinical status or if worse at any time   Will cc to pcp

## 2019-07-28 NOTE — Telephone Encounter (Signed)
Okay sounds good 

## 2019-07-28 NOTE — Telephone Encounter (Signed)
Spoke to [place]. Said she is feeling better. Yesterday she got up and dressed. Started Keflex yesterday. Having a better morning today. BP has come down. Will keep [place] updated if she does not continue to improve.

## 2019-07-28 NOTE — Telephone Encounter (Signed)
Per this note Dr Glori Bickers has already spoken with pt.

## 2019-07-28 NOTE — Telephone Encounter (Signed)
[facility] Night - Client TELEPHONE ADVICE RECORD [facility] Patient Name: Leah Hayden Gender: Female DOB: 04-24-1928 Age: 85 Y 56 M 24 D Return Phone Number: [phone] (Primary) Address: City/State/Zip: [place] [address] Client [facility] Night - Client Client Site [place] Physician Viviana Simpler - MD Contact Type Call Who Is Calling Patient / Member / Family / Caregiver Call Type Triage / Clinical Caller Name Maudie Mercury Bobb Relationship To Patient Son Return Phone Number [phone] (Primary) Chief Complaint BREATHING - fast, heavy or wheezing Reason for Call Symptomatic / Request for [place] states that his mother was in yesterday. She is worse this morning. Her blood pressure is very high and she is breathing hard. Her BP is 190/76. She is not feeling well and can't really explain how she feels. Translation No Nurse Assessment Nurse: Rufina Falco, RN, Traci Date/Time (Eastern Time): 07/26/2019 10:06:34 AM Confirm and document reason for call. If symptomatic, describe symptoms. ---Pt is having some confusion and hallucinations at night. Went to dr. on Monday. Woke up this morning and said she didn't feel good, BP is 194/90 HR, doesn't feel feverish, skin is clammy, feels weak, has complained of vision changes. Some nausea. Symptoms started a week ago but feels worse today. Has the patient had close contact with a person known or suspected to have the novel coronavirus illness OR traveled / lives in area with major community spread (including international travel) in the last 14 days from the onset of symptoms? * If Asymptomatic, screen for exposure and travel within the last 14 days. ---No Does the patient have any new or worsening symptoms? ---Yes Will a triage be completed? ---Yes Related visit to physician within the last 2 weeks? ---Yes Does the PT have  any chronic conditions? (i.e. diabetes, asthma, this includes High risk factors for pregnancy, etc.) ---Yes List chronic conditions. ---asthma, hx afib, pacemaker, allergies, high BP Is this a behavioral health or substance abuse call? ---No PLEASE NOTE: All timestamps contained within this report are represented as [place] Standard Time. CONFIDENTIALTY NOTICE: This fax transmission is intended only for the addressee. It contains information that is legally privileged, confidential or otherwise protected from use or disclosure. If you are not the intended recipient, you are strictly prohibited from reviewing, disclosing, copying using or disseminating any of this information or taking any action in reliance on or regarding this information. If you have received this fax in error, please notify us immediately by telephone so that we can arrange for its return to [place]. Phone: [phone], Toll-Free: [phone], Fax: [phone] Page: 2 of 2 Call Id: [id number] Guidelines Guideline Title Affirmed Question Affirmed Notes Nurse Date/Time Eilene Ghazi Time) High Blood Pressure [id number] Systolic BP >= 0000000 OR Diastolic >= [id number] AND [id number] cardiac or neurologic symptoms (e.g., chest pain, difficulty breathing, unsteady gait, blurred vision) Boedges, RN, Traci 07/26/2019 10:15:14 AM Disp. Time Eilene Ghazi Time) Disposition Final User 07/26/2019 10:05:15 AM Send to Urgent Queue Vito Backers 07/26/2019 10:20:57 AM Go to ED Now Yes Rufina Falco, RN, Traci Caller Disagree/Comply Comply Caller Understands Yes PreDisposition Home Care Care Advice Given Per Guideline GO TO ED NOW: CALL EMS 911 IF: * Patient passes out, starts acting confused or becomes too weak to stand. * You become worse. CARE ADVICE given per High Blood Pressure (Adult) guideline. * Another adult should drive. Referrals [facility] - ED

## 2019-07-28 NOTE — Progress Notes (Signed)
Remote pacemaker transmission.   

## 2019-07-28 NOTE — Telephone Encounter (Signed)
Was in the ER after this call Nothing really found and sent home On keflex for possible UTI---culture not definitive but if any residual symptoms, would change her antibiotic. Please check on her today

## 2019-07-31 ENCOUNTER — Other Ambulatory Visit: Payer: Self-pay | Admitting: Cardiovascular Disease

## 2019-07-31 ENCOUNTER — Other Ambulatory Visit: Payer: Self-pay | Admitting: Physician Assistant

## 2019-07-31 MED ORDER — RIVAROXABAN 15 MG PO TABS: 15.0000 mg | ORAL_TABLET | Freq: Every day | ORAL | 3 refills | Status: DC

## 2019-07-31 NOTE — Telephone Encounter (Signed)
*  STAT* If patient is at the pharmacy, call can be transferred to refill team.   1. Which medications need to be refilled? (please list name of each medication and dose if known) Xarelto/ Diltiazem  2. Which pharmacy/location (including street and city if local pharmacy) is medication to be sent to? [facility]  3. Do they need a 30 day or 90 day supply? [place]

## 2019-07-31 NOTE — Telephone Encounter (Signed)
Xarelto refill request.  

## 2019-08-18 ENCOUNTER — Telehealth: Payer: Self-pay | Admitting: Emergency Medicine

## 2019-08-18 NOTE — Telephone Encounter (Signed)
Received alert for AF burden of 78% on 08/18/19. Spoke with patient who has dementia. She reports no SOB, able to speak in full sentences, caregiver reports is unsteady on hr feet. Spoke to [name] (family) per [facility] and she reports patient was in AF yesterday. Manual pulse taken and was irregular. BP was 80-90s / 40-50s. No missed doses of ditiazem or Xarelto. Due for f/o with Dr Rockey Situ 09/09/19 and wanted to know if she could cancel that appointment and just see Dr Caryl Comes. Patient was much better in the afternoon on 08/17/19. ED precautions given.

## 2019-08-25 NOTE — Telephone Encounter (Signed)
To Dr. Klein to review. 

## 2019-08-26 NOTE — Telephone Encounter (Signed)
H  please talk with her daguhter  We can arrange telehealth visit  if she would like but if her symptoms are stable notwithstanding the AFib,would not do anything about the afib  Thanks SK

## 2019-08-26 NOTE — Telephone Encounter (Signed)
I spoke with the patient's daughter, [name].  Per [name], the patient has been doing fairly well since her episode of a-fib last week. That day, they could tell she was in AF as she just wasn't feeling good and her pulse was irregular.   The patient is currently scheduled to see Dr. Rockey Situ and Dr. Caryl Comes on 2/16. I advised [name], that the patient does not need to see both physicians that day. [name] is aware that we can cancel the appointment with Dr. Rockey Situ and have her see Dr. Caryl Comes that day. I have advised [name], per Dr. Caryl Comes, if she tolerates her a-fib ok then we would not really do anything about the a-fib . [name] prefers at this time to keep the appointment with Dr. Caryl Comes as an in office visit.  They will closer to the appointment time if they wish to switch this to a virtual appointment.

## 2019-09-02 ENCOUNTER — Ambulatory Visit: Payer: PPO | Admitting: Cardiovascular Disease

## 2019-09-02 ENCOUNTER — Other Ambulatory Visit: Payer: Self-pay

## 2019-09-02 ENCOUNTER — Ambulatory Visit: Payer: PPO | Admitting: Podiatry

## 2019-09-02 ENCOUNTER — Encounter: Payer: Self-pay | Admitting: Podiatry

## 2019-09-02 DIAGNOSIS — L989 Disorder of the skin and subcutaneous tissue, unspecified: Secondary | ICD-10-CM

## 2019-09-02 DIAGNOSIS — B351 Tinea unguium: Secondary | ICD-10-CM | POA: Diagnosis not present

## 2019-09-02 DIAGNOSIS — M79676 Pain in unspecified toe(s): Secondary | ICD-10-CM | POA: Diagnosis not present

## 2019-09-03 ENCOUNTER — Encounter: Payer: Self-pay | Admitting: Internal Medicine

## 2019-09-03 ENCOUNTER — Ambulatory Visit (INDEPENDENT_AMBULATORY_CARE_PROVIDER_SITE_OTHER): Payer: PPO | Admitting: Internal Medicine

## 2019-09-03 DIAGNOSIS — I495 Sick sinus syndrome: Secondary | ICD-10-CM

## 2019-09-03 DIAGNOSIS — Z0000 Encounter for general adult medical examination without abnormal findings: Secondary | ICD-10-CM

## 2019-09-03 DIAGNOSIS — K219 Gastro-esophageal reflux disease without esophagitis: Secondary | ICD-10-CM | POA: Diagnosis not present

## 2019-09-03 DIAGNOSIS — I480 Paroxysmal atrial fibrillation: Secondary | ICD-10-CM | POA: Diagnosis not present

## 2019-09-03 DIAGNOSIS — I25119 Atherosclerotic heart disease of native coronary artery with unspecified angina pectoris: Secondary | ICD-10-CM

## 2019-09-03 DIAGNOSIS — Z7189 Other specified counseling: Secondary | ICD-10-CM | POA: Diagnosis not present

## 2019-09-03 DIAGNOSIS — F0150 Vascular dementia without behavioral disturbance: Secondary | ICD-10-CM | POA: Diagnosis not present

## 2019-09-03 DIAGNOSIS — M159 Polyosteoarthritis, unspecified: Secondary | ICD-10-CM | POA: Diagnosis not present

## 2019-09-03 DIAGNOSIS — F39 Unspecified mood [affective] disorder: Secondary | ICD-10-CM | POA: Diagnosis not present

## 2019-09-03 NOTE — Assessment & Plan Note (Signed)
Has pacer in Monitored by cardiology

## 2019-09-03 NOTE — Assessment & Plan Note (Signed)
Did have spell of several hours recently Still on the diltiazem and xarelto

## 2019-09-03 NOTE — Assessment & Plan Note (Signed)
Seems to have stable DOE No recent chest pain

## 2019-09-03 NOTE — Assessment & Plan Note (Addendum)
Mild decline per family Is alone 16 hours--fine at night Needs reminders about medications Some concerns about her thinking something is wrong in house (mostly at night)--better in AM Continues on donepezil

## 2019-09-03 NOTE — Assessment & Plan Note (Signed)
Taking the PPI No symptoms Discussed trying every other day with this

## 2019-09-03 NOTE — Assessment & Plan Note (Signed)
Has DNR 

## 2019-09-03 NOTE — Progress Notes (Signed)
Subjective:    Patient ID: Leah Hayden, female    DOB: 1928-03-20, 86 y.o.   MRN: [id number]  HPI Here for Medicare wellness visit and follow up of chronic health conditions With DIL [name] as usual  This visit occurred during the SARS-CoV-2 public health emergency.  Safety protocols were in place, including screening questions prior to the visit, additional usage of staff PPE, and extensive cleaning of exam room while observing appropriate contact time as indicated for disinfecting solutions.   Reviewed form and advanced directives Reviewed other doctors No alcohol or tobacco Doesn't exercise Did have a fall with injury No persistent depression or anhedonia Vision seems fine No hearing problems  Still in her home  Has aide 8 hours per day Doesn't drive Stand by assistance for showers Independent with dressing and bathroom Chronic urine incontinence---also bouts of diarrhea.  Uses pad at night Family does notice some cognitive decline--more trouble remembering what she wanted to stay  Has pacer in Cardiology monitors this--over phone Several hour bout of atrial fib recently--will be seeing Dr Caryl Comes soon No chest pain No palpitations Some DOE --like taking the trash out Mild edema at times  Current Outpatient Medications on File Prior to Visit  Medication Sig Dispense Refill  . albuterol (PROVENTIL HFA) 108 (90 BASE) MCG/ACT inhaler Inhale 2 puffs into the lungs every 6 (six) hours as needed for wheezing or shortness of breath.     . cyanocobalamin 500 MCG tablet Take 500 mcg by mouth every other day.     . diltiazem (CARDIZEM) 30 MG tablet TAKE 1 TABLET (30 MG TOTAL) BY MOUTH EVERY 8 (EIGHT) HOURS 270 tablet 0  . donepezil (ARICEPT) 5 MG tablet TAKE 1 TABLET (5 MG TOTAL) BY MOUTH AT BEDTIME. 90 tablet 3  . Fluticasone-Salmeterol (ADVAIR DISKUS) 100-50 MCG/DOSE AEPB Inhale 1 puff into the lungs daily as needed (shortness of breath or wheezing).     . hydroxypropyl  methylcellulose / hypromellose (ISOPTO TEARS / GONIOVISC) 2.5 % ophthalmic solution Place 1 drop into both eyes 3 (three) times daily as needed for dry eyes.    Marland Kitchen losartan (COZAAR) 25 MG tablet TAKE 2 TABLETS BY MOUTH EVERY DAY 180 tablet 3  . montelukast (SINGULAIR) 10 MG tablet Take 10 mg by mouth every evening.     . Multiple Vitamins-Minerals (CENTRUM SILVER 50+WOMEN) TABS Take 1 tablet by mouth daily.    . nitroGLYCERIN (NITROSTAT) 0.4 MG SL tablet Place 1 tablet (0.4 mg total) under the tongue every 5 (five) minutes as needed for chest pain. 25 tablet 1  . pantoprazole (PROTONIX) 40 MG tablet TAKE 1 TABLET BY MOUTH EVERY DAY 90 tablet 0  . Rivaroxaban (XARELTO) 15 MG TABS tablet Take 1 tablet (15 mg total) by mouth daily with supper. 90 tablet 3  . VITAMIN D PO Take 5,000 Units by mouth daily.      No current facility-administered medications on file prior to visit.    Allergies  Allergen Reactions  . Amiodarone Other (See Comments)    Chest discomfort  . Eliquis [Apixaban] Rash  . Other Rash    oramycin    Past Medical History:  Diagnosis Date  . Allergic rhinitis   . Asthma   . Biceps tendon tear    a. right->s/p surgical repair winter of 2014.  . Diverticulosis of colon   . GERD (gastroesophageal reflux disease)   . Hammer toe of right foot   . Hx of colonic polyps   .  HX: breast cancer   . Meniere's disease   . NSTEMI (non-ST elevated myocardial infarction) ([place])    a. 11/2012 in setting of rapid afib;  b. 11/2012 Cath: nl EF, nonobs CAD->Med Rx;  b. 11/2012 Echo: EF 60-65%, mild LVH, mild MR/TR.  . Osteoarthritis   . PAF (paroxysmal atrial fibrillation) ([place])    a. Eliquis and amio initiated 11/2012 in setting of admission with RVR and NSTEMI;  b. Subsequently changed to flecainide and xarelto;  c. 11/2013 Echo: EF 60-65%, mild LVH, mod MR/TR.  Marland Kitchen Sinus brady-tachy syndrome ([place])    a. Post-conversion pause of 7.2 seconds during 11/2012 hospitalization @ [facility]; b. 04/2017  Event Monitor: No significant arrhythmias.    Past Surgical History:  Procedure Laterality Date  . ABDOMINAL HYSTERECTOMY    . CARDIAC CATHETERIZATION  11/2013   [facility]  . [place]   2nd to left toe  . ESOPHAGOGASTRODUODENOSCOPY  10/1999   inflammation only   . FOREIGN BODY REMOVAL Right 01/02/2013   Procedure: FOREIGN BODY REMOVAL RIGHT INDEX FINGER;  Surgeon: Cammie Sickle., MD;  Location: [facility];  Service: Orthopedics;  Laterality: Right;  . HAMMER TOE SURGERY  11/15   Dr Milinda Pointer  . MASTECTOMY  1984   left   . MASTECTOMY  1985   right  . PACEMAKER IMPLANT N/A 09/20/2018   Procedure: PACEMAKER IMPLANT;  Surgeon: Deboraha Sprang, MD;  Location: [facility];  Service: Cardiovascular;  Laterality: N/A;  . PARTIAL HYSTERECTOMY  1966   w/ bladder tack  . ROTATOR CUFF REPAIR  10/09   right   . TOE SURGERY  2001/2002   2nd/3rd,left     Family History  Problem Relation Age of Onset  . Heart failure Mother   . Gout Mother   . Heart disease Mother        heart failure  . Arthritis Mother   . Heart failure Father   . Heart disease Father        heart failure  . Diabetes Maternal Grandmother   . Colon cancer Maternal Grandfather   . Cancer Maternal Grandfather        colon  . Arthritis Brother     Social History   Socioeconomic History  . Marital status: Widowed    Spouse name: Not on file  . Number of children: 2  . Years of education: Not on file  . Highest education level: Not on file  Occupational History  . 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 Smoking status: Never Smoker  . Smokeless tobacco: Never Used  Substance and Sexual Activity  . Alcohol use: No    Alcohol/week: 0.0 standard drinks  . Drug use: No  . Sexual activity: Not on file  Other Topics Concern  . Not on file  Social History Narrative   Now has living will   Sons are health care POAs   Has DNR   No tube  feeds if cognitively unaware   Social Determinants of Health   Financial Resource Strain:   . Difficulty of Paying Living Expenses: Not on file  Food Insecurity:   . Worried About Charity fundraiser in the Last Year: Not on file  . Ran Out of Food in the Last Year: Not on file  Transportation Needs:   . Lack of Transportation (Medical): Not on file  . Lack of Transportation (Non-Medical): Not  on file  Physical Activity:   . Days of Exercise per Week: Not on file  . Minutes of Exercise per Session: Not on file  Stress:   . Feeling of Stress : Not on file  Social Connections:   . Frequency of Communication with Friends and Family: Not on file  . Frequency of Social Gatherings with Friends and Family: Not on file  . Attends Religious Services: Not on file  . Active Member of Clubs or Organizations: Not on file  . Attends Archivist Meetings: Not on file  . Marital Status: Not on file  Intimate Partner Violence:   . Fear of Current or Ex-Partner: Not on file  . Emotionally Abused: Not on file  . Physically Abused: Not on file  . Sexually Abused: Not on file   Review of Systems Did have skin cancer removed from leg Appetite is okay Weight is stable Sleeps very well On cranberry to prevent UTI Bowels are generally okay--loose at times. No blood No current suspicious skin lesions No heartburn or dysphagia    Objective:   Physical Exam  Constitutional: No distress.  Neck: No thyromegaly present.  Cardiovascular: Normal rate, regular rhythm, normal heart sounds and intact distal pulses. Exam reveals no gallop.  No murmur heard. Respiratory: Effort normal and breath sounds normal. No respiratory distress. She has no wheezes. She has no rales.  GI: Soft. There is no abdominal tenderness.  Musculoskeletal:        General: No edema.  Lymphadenopathy:    She has no cervical adenopathy.  Skin: No rash noted. No erythema.  Psychiatric: She has a normal mood and affect.  Her behavior is normal.           Assessment & Plan:

## 2019-09-03 NOTE — Assessment & Plan Note (Signed)
Mild generalized arthritis Tylenol prn--mostly for back pain

## 2019-09-03 NOTE — Assessment & Plan Note (Signed)
Gets some mild anxiety at night Trouble with orientation and remembering where she is  No medication for this No psychosis at this point

## 2019-09-03 NOTE — Progress Notes (Signed)
Hearing Screening   125Hz  250Hz  500Hz  1000Hz  2000Hz  3000Hz  4000Hz  6000Hz  8000Hz   Right ear:   20 20 40  0    Left ear:   25 20 0  0      Visual Acuity Screening   Right eye Left eye Both eyes  Without correction: 20/40 20/15 20/30   With correction:

## 2019-09-03 NOTE — Assessment & Plan Note (Signed)
I have personally reviewed the Medicare Annual Wellness questionnaire and have noted 1. The patient's medical and social history 2. Their use of alcohol, tobacco or illicit drugs 3. Their current medications and supplements 4. The patient's functional ability including ADL's, fall risks, home safety risks and hearing or visual             impairment. 5. Diet and physical activities 6. Evidence for depression or mood disorders  The patients weight, height, BMI and visual acuity have been recorded in the chart I have made referrals, counseling and provided education to the patient based review of the above and I have provided the pt with a written personalized care plan for preventive services.  I have provided you with a copy of your personalized plan for preventive services. Please take the time to review along with your updated medication list.  COVID vaccine ASAP Yearly flu vaccine No cancer screening due to age Td booster if any injury

## 2019-09-04 ENCOUNTER — Telehealth: Payer: Self-pay | Admitting: Internal Medicine

## 2019-09-04 ENCOUNTER — Telehealth: Payer: Self-pay | Admitting: Cardiovascular Disease

## 2019-09-04 NOTE — Progress Notes (Signed)
    Subjective: Patient is a 86 y.o. female presenting to the office today for follow up evaluation of painful callus lesion(s) noted to the bilateral feet. Walking and bearing weight increases the pain. She has not had any recent treatment for the symptoms.  Patient also complains of elongated, thickened nails that cause pain while ambulating in shoes. She is unable to trim her own nails. Patient presents today for further treatment and evaluation.  Past Medical History:  Diagnosis Date  . Allergic rhinitis   . Asthma   . Biceps tendon tear    a. right->s/p surgical repair winter of 2014.  . Diverticulosis of colon   . GERD (gastroesophageal reflux disease)   . Hammer toe of right foot   . Hx of colonic polyps   . HX: breast cancer   . Meniere's disease   . NSTEMI (non-ST elevated myocardial infarction) ([place])    a. 11/2012 in setting of rapid afib;  b. 11/2012 Cath: nl EF, nonobs CAD->Med Rx;  b. 11/2012 Echo: EF 60-65%, mild LVH, mild MR/TR.  . Osteoarthritis   . PAF (paroxysmal atrial fibrillation) ([place])    a. Eliquis and amio initiated 11/2012 in setting of admission with RVR and NSTEMI;  b. Subsequently changed to flecainide and xarelto;  c. 11/2013 Echo: EF 60-65%, mild LVH, mod MR/TR.  Marland Kitchen Sinus brady-tachy syndrome ([place])    a. Post-conversion pause of 7.2 seconds during 11/2012 hospitalization @ [facility]; b. 04/2017 Event Monitor: No significant arrhythmias.    Objective:  Physical Exam General: Alert and oriented x3 in no acute distress  Dermatology: Hyperkeratotic lesion(s) present on the bilateral feet. Pain on palpation with a central nucleated core noted. Skin is warm, dry and supple bilateral lower extremities. Negative for open lesions or macerations. Nails are tender, long, thickened and dystrophic with subungual debris, consistent with onychomycosis, 1-5 bilateral. No signs of infection noted.  Vascular: Palpable pedal pulses bilaterally. No edema or erythema noted. Capillary  refill within normal limits.  Neurological: Epicritic and protective threshold grossly intact bilaterally.   Musculoskeletal Exam: Pain on palpation at the keratotic lesion(s) noted. Range of motion within normal limits bilateral. Muscle strength 5/5 in all groups bilateral.  Assessment: 1. Onychodystrophic nails 1-5 bilateral with hyperkeratosis of nails.  2. Onychomycosis of nail due to dermatophyte bilateral 3. Pre-ulcerative callus lesions noted to the bilateral feet x 2   Plan of Care:  1. Patient evaluated. 2. Excisional debridement of keratoic lesion(s) using a chisel blade was performed without incident.  3. Dressed with light dressing. 4. Mechanical debridement of nails 1-5 bilaterally performed using a nail nipper. Filed with dremel without incident.  5. Patient is to return to the clinic in 3 months.   Edrick Kins, DPM [facility]  Dr. Edrick Kins, [address]                                        [place], [place] [address]                Office [phone]  Fax [phone]

## 2019-09-04 NOTE — Telephone Encounter (Signed)
Returned Scientist, research (life sciences) (daughter-in-law) call, but the reception was bad then call disconnected.   Need to inform her, per Dr. [name], pt will need an office visit.

## 2019-09-04 NOTE — Telephone Encounter (Signed)
Biotronik nightly transmission reviewed, no recent cardiac episodes noted.  Attempted to callJean back, no answer.  DPR on record.  Left detailed message advising data transmitted at 0102 this morning shows no cardiac episodes.

## 2019-09-04 NOTE — Telephone Encounter (Signed)
She will need to be seen and a urine checked here I am off tomorrow---please see if we can get her in with someone else

## 2019-09-04 NOTE — Telephone Encounter (Signed)
Pt daughter in law states pt is extremely weak and BP 154/79 HR 65 this morning. States she had to be assisted going to the bathroom and her caregiver had to physically put her medication in her mouth. States she did eat breakfast. States after she went to the bathroom, she felt "clammy"

## 2019-09-04 NOTE — Telephone Encounter (Signed)
[name] Witherell is calling requesting the patient be tested for a UTI.  Pt is having confusion and anxiety and the family is just wanting a UTI ruled out.  They called EMS last night d/t her increased symptoms and by the time they arrived she had no symptoms and had "perked up".  [name] is wanting to know if the sample supplies can be picked up or if the patient will need to come in and leave a sample.   Please advise, thanks.

## 2019-09-04 NOTE — Telephone Encounter (Signed)
Spoke with daughter and she states that she had a rough morning. She was weak and could barely lift her arms up. They had to carry her to bathroom and finally they called EMT services. They came in to check her out and found nothing wrong and no reason to transport to ED. Daughter calling to see if her monitor showed anything that could have happened last night. Inquired what type of monitor and she reported pacemaker. Reviewed that I would forward this to device clinic for them to review report and she did request if they would give her a call at [phone]. She thought it could be anxiety related since vitals were stable and she saw her PCP yesterday. Let her know that I would forward to device and someone would be in touch. She was appreciative with no further questions.

## 2019-09-05 ENCOUNTER — Other Ambulatory Visit: Payer: Self-pay

## 2019-09-05 ENCOUNTER — Encounter: Payer: Self-pay | Admitting: Family Medicine

## 2019-09-05 ENCOUNTER — Ambulatory Visit (INDEPENDENT_AMBULATORY_CARE_PROVIDER_SITE_OTHER): Payer: PPO | Admitting: Family Medicine

## 2019-09-05 VITALS — BP 138/72 | HR 74 | Temp 97.4°F | Wt 153.0 lb

## 2019-09-05 DIAGNOSIS — R410 Disorientation, unspecified: Secondary | ICD-10-CM

## 2019-09-05 LAB — CBC WITH DIFFERENTIAL/PLATELET
Basophils Absolute: 0 10*3/uL (ref 0.0–0.1)
Basophils Relative: 0.3 % (ref 0.0–3.0)
Eosinophils Absolute: 0.1 10*3/uL (ref 0.0–0.7)
Eosinophils Relative: 1.7 % (ref 0.0–5.0)
HCT: 38.6 % (ref 36.0–46.0)
Hemoglobin: 12.8 g/dL (ref 12.0–15.0)
Lymphocytes Relative: 22.8 % (ref 12.0–46.0)
Lymphs Abs: 1.5 10*3/uL (ref 0.7–4.0)
MCHC: 33.2 g/dL (ref 30.0–36.0)
MCV: 93.6 fl (ref 78.0–100.0)
Monocytes Absolute: 0.5 10*3/uL (ref 0.1–1.0)
Monocytes Relative: 7.8 % (ref 3.0–12.0)
Neutro Abs: 4.3 10*3/uL (ref 1.4–7.7)
Neutrophils Relative %: 67.4 % (ref 43.0–77.0)
Platelets: 211 10*3/uL (ref 150.0–400.0)
RBC: 4.12 Mil/uL (ref 3.87–5.11)
RDW: 13.4 % (ref 11.5–15.5)
WBC: 6.4 10*3/uL (ref 4.0–10.5)

## 2019-09-05 LAB — POC URINALSYSI DIPSTICK (AUTOMATED)
Bilirubin, UA: NEGATIVE
Blood, UA: NEGATIVE
Glucose, UA: NEGATIVE
Ketones, UA: NEGATIVE
Leukocytes, UA: NEGATIVE
Nitrite, UA: NEGATIVE
Protein, UA: NEGATIVE
Spec Grav, UA: >=1.03 — AB (ref 1.010–1.025)
Urobilinogen, UA: 0.2 E.U./dL
pH, UA: 6 (ref 5.0–8.0)

## 2019-09-05 LAB — COMPREHENSIVE METABOLIC PANEL
ALT: 15 U/L (ref 0–35)
AST: 18 U/L (ref 0–37)
Albumin: 4 g/dL (ref 3.5–5.2)
Alkaline Phosphatase: 76 U/L (ref 39–117)
BUN: 21 mg/dL (ref 6–23)
CO2: 29 mEq/L (ref 19–32)
Calcium: 9.6 mg/dL (ref 8.4–10.5)
Chloride: 103 mEq/L (ref 96–112)
Creatinine, Ser: 1.22 mg/dL — ABNORMAL HIGH (ref 0.40–1.20)
GFR: 41.44 mL/min — ABNORMAL LOW (ref >60.00)
Glucose, Bld: 119 mg/dL — ABNORMAL HIGH (ref 70–99)
Potassium: 4.3 mEq/L (ref 3.5–5.1)
Sodium: 139 mEq/L (ref 135–145)
Total Bilirubin: 0.7 mg/dL (ref 0.2–1.2)
Total Protein: 6.8 g/dL (ref 6.0–8.3)

## 2019-09-05 MED ORDER — PANTOPRAZOLE SODIUM 40 MG PO TBEC: 40.0000 mg | DELAYED_RELEASE_TABLET | ORAL | Status: DC

## 2019-09-05 NOTE — Telephone Encounter (Signed)
Noted  

## 2019-09-05 NOTE — Telephone Encounter (Signed)
[place] said [name] called back and scheduled in office 30' appt with Dr [name] today at 11:30 AM.

## 2019-09-05 NOTE — Progress Notes (Signed)
This visit occurred during the SARS-CoV-2 public health emergency.  Safety protocols were in place, including screening questions prior to the visit, additional usage of staff PPE, and extensive cleaning of exam room while observing appropriate contact time as indicated for disinfecting solutions.  Recent confusion noted.  Prev phone note d/w pt/family.  She prev felt weak and confused yesterday.  EMS called.  She didn't have focal weakness.  Her BP was normal and she wasn't in A fib based on family report.  She has had some confusion today.  She had prev h/o confusion some nights but usually not during the day.  No fevers.  She had some diarrhea about 1 week ago- that happens episodically at baseline.  No pain.  No burning with urination.  She is not completely back to baseline but she does not have as dramatic change from baseline compared to yesterday.  Meds, vitals, and allergies reviewed.   ROS: Per HPI unless specifically indicated in ROS section   nad ncat Neck supple, no LA rrr ctab abd soft, not ttp, normal bowel sounds, suprapubic area not tender Ext w/o edema.  Skin well perfused. Speech fluent.  Gait symmetric.  No obvious focal weakness on the extremities.  U/a neg, ucx pending.  Discussed with patient and family at office visit.

## 2019-09-05 NOTE — Telephone Encounter (Signed)
I spoke with [name] this morning; [name] has spoke with Joycelyn Schmid who is with pt and pt is acting like she did yesterday when EMS was called. [name] is going to pts home this morning ASAP and will either call back to [facility] for appt or call EMS. [name] will let our office know after she sees pt.

## 2019-09-05 NOTE — Patient Instructions (Signed)
Go to the lab on the way out.  We'll call about the results.  Update [place] as needed.  I wouldn't change any meds at this point.  Take care.  Glad to see you.

## 2019-09-05 NOTE — Telephone Encounter (Signed)
See OV note.  Thanks.  

## 2019-09-05 NOTE — Telephone Encounter (Signed)
Pt daughter, [name].  Biotronik transmission for this AM reviewed, no cardiac episodes.  Pt has appt with PCP today regarding her symptoms.

## 2019-09-05 NOTE — Telephone Encounter (Signed)
[place] Night - Client Nonclinical Telephone Record AccessNurse Client [place] Night - Client Client Site [place] Physician Viviana Simpler - MD Contact Type Call Who Is Calling Patient / Member / Family / Caregiver Caller Name [place] Phone Number [phone] Call Type Message Only Information Provided Reason for Call Returning a Call from the Office Initial [place] states she is returning a call. Additional Comment The patient is Leah Hayden. Disp. Time Disposition Final User 09/04/2019 5:33:16 PM General Information Provided Yes Swanner, Stefanie Call Closed By: Corey Harold Transaction Date/Time: 09/04/2019 5:30:42 PM (ET)

## 2019-09-06 LAB — URINE CULTURE
MICRO NUMBER:: 10071127
SPECIMEN QUALITY:: ADEQUATE

## 2019-09-07 NOTE — Assessment & Plan Note (Signed)
She is not completely back to baseline but she does not have as dramatic change from baseline compared to yesterday. Skin well perfused. Speech fluent.  Gait symmetric.  No obvious focal weakness on the extremities. At this point still okay for outpatient follow-up.  Check labs today.  Urinalysis negative, urine culture pending.  See notes on labs.  No change in meds for now.  Family agrees with plan.  Patient agrees with plan.

## 2019-09-08 ENCOUNTER — Telehealth: Payer: Self-pay | Admitting: Internal Medicine

## 2019-09-08 NOTE — Telephone Encounter (Signed)
[name] advised.

## 2019-09-08 NOTE — Telephone Encounter (Signed)
Leah Hayden called checking on labs results for pt

## 2019-09-09 ENCOUNTER — Ambulatory Visit: Payer: PPO | Admitting: Cardiovascular Disease

## 2019-09-18 DIAGNOSIS — H353131 Nonexudative age-related macular degeneration, bilateral, early dry stage: Secondary | ICD-10-CM | POA: Diagnosis not present

## 2019-09-23 ENCOUNTER — Telehealth: Payer: Self-pay | Admitting: Internal Medicine

## 2019-09-23 NOTE — Chronic Care Management (AMB) (Signed)
  Chronic Care Management   Note  09/23/2019 Name: Leah Hayden MRN: [id number] DOB: Jul 27, 1928  Leah Hayden is a 86 y.o. year old female who is a primary care patient of Venia Carbon, MD. I reached out to [employer] by phone today in response to a referral sent by Leah Hayden's PCP, Venia Carbon, MD. Daughter in law, Celest Reitz handles meds and appointments. Leah Hayden gave verbal consent and will speak to pharmacist on [place] behalf.  Leah Hayden was given information about Chronic Care Management services today including:  1. CCM service includes personalized support from designated clinical staff supervised by her physician, including individualized plan of care and coordination with other care providers 2. 24/7 contact phone numbers for assistance for urgent and routine care needs. 3. Service will only be billed when office clinical staff spend 20 minutes or more in a month to coordinate care. 4. Only one practitioner may furnish and bill the service in a calendar month. 5. The patient may stop CCM services at any time (effective at the end of the month) by phone call to the office staff. 6. The patient will be responsible for cost sharing (co-pay) of up to 20% of the service fee (after annual deductible is met).  Patient agreed to services and verbal consent obtained.   Follow up plan:   Raynicia Dukes [employer] Scheduler

## 2019-09-23 NOTE — Progress Notes (Signed)
°  Chronic Care Management   Outreach Note  09/23/2019 Name: Leah Hayden MRN: [id number] DOB: 08-02-1928  Referred by: Venia Carbon, MD Reason for referral : No chief complaint on file.   An unsuccessful telephone outreach was attempted today. The patient was referred to the pharmacist for assistance with care management and care coordination.   Follow Up Plan:   Raynicia Dukes [employer] Scheduler

## 2019-09-24 ENCOUNTER — Ambulatory Visit: Payer: PPO

## 2019-09-24 ENCOUNTER — Other Ambulatory Visit: Payer: Self-pay

## 2019-09-24 DIAGNOSIS — J4520 Mild intermittent asthma, uncomplicated: Secondary | ICD-10-CM

## 2019-09-24 DIAGNOSIS — E538 Deficiency of other specified B group vitamins: Secondary | ICD-10-CM

## 2019-09-24 DIAGNOSIS — K219 Gastro-esophageal reflux disease without esophagitis: Secondary | ICD-10-CM

## 2019-09-24 DIAGNOSIS — N183 Chronic kidney disease, stage 3 unspecified: Secondary | ICD-10-CM

## 2019-09-24 DIAGNOSIS — I480 Paroxysmal atrial fibrillation: Secondary | ICD-10-CM

## 2019-09-24 DIAGNOSIS — F0150 Vascular dementia without behavioral disturbance: Secondary | ICD-10-CM

## 2019-09-24 DIAGNOSIS — E782 Mixed hyperlipidemia: Secondary | ICD-10-CM

## 2019-09-24 NOTE — Patient Instructions (Addendum)
Dear Leah Hayden,  It was a pleasure meeting your daughter during our initial appointment on September 24, 2019. Your medications were thoroughly reviewed. Below is a summary of the goals we discussed and components of chronic care management. Please contact me anytime with questions or concerns.   Visit Information  Goals Addressed            This Visit's Progress   . Pharmacy Care Plan       Current Barriers:  . Chronic Disease Management support, education, and care coordination needs related to AFIB, vascular dementia, allergic rhinitis, asthma, GERD, HLD, CKD, B12 deficiency   Pharmacist Clinical Goal(s):  Marland Kitchen Pharmacist will continue to review medication safety, drug interactions, and dosing with kidney function every 6 months . Patient will continue taking medication daily as prescribed . Caregiver/patient will contact pharmacist with any medication needs  Interventions: . Comprehensive medication review performed.  Patient Self Care Activities:  . Patient has a supportive family for assistance with medication needs . Sitter helps with medication dosing Monday through Friday  Initial goal documentation        [name] was given information about Chronic Care Management services today including:  1. CCM service includes personalized support from designated clinical staff supervised by her physician, including individualized plan of care and coordination with other care providers 2. 24/7 contact phone numbers for assistance for urgent and routine care needs. 3. Service will only be billed when office clinical staff spend 20 minutes or more in a month to coordinate care. 4. Only one practitioner may furnish and bill the service in a calendar month. 5. The patient may stop CCM services at any time (effective at the end of the month) by phone call to the office staff. 6. The patient will be responsible for cost sharing (co-pay) of up to 20% of the service fee (after annual  deductible is met).  Patient agreed to services and verbal consent obtained.   Telephone follow up appointment with pharmacy team member scheduled for: Monday, March 15, 2020 at 10:00 AM  [name], PharmD Clinical Pharmacist [facility] [phone]  .Rivaroxaban oral tablets What is this medicine? RIVAROXABAN (ri va ROX a ban) is an anticoagulant (blood thinner). It is used to treat blood clots in the lungs or in the veins. It is also used to prevent blood clots in the lungs or in the veins. It is also used to lower the chance of stroke in people with a medical condition called atrial fibrillation. This medicine may be used for other purposes; ask your health care provider or pharmacist if you have questions. COMMON BRAND NAME(S): Xarelto, Xarelto Starter Pack What should I tell my health care provider before I take this medicine? They need to know if you have any of these conditions:  antiphospholipid antibody syndrome  artificial heart valve  bleeding disorders  bleeding in the brain  blood in your stools (black or tarry stools) or if you have blood in your vomit  history of blood clots  history of stomach bleeding  kidney disease  liver disease  low blood counts, like low white cell, platelet, or red cell counts  recent or planned spinal or epidural procedure  take medicines that treat or prevent blood clots  an unusual or allergic reaction to rivaroxaban, other medicines, foods, dyes, or preservatives  pregnant or trying to get pregnant  breast-feeding How should I use this medicine? Take this medicine by mouth with a  glass of water. Follow the directions on the prescription label. Take your medicine at regular intervals. Do not take it more often than directed. Do not stop taking except on your doctor's advice. Stopping this medicine may increase your risk of a blood clot. Be sure to refill your prescription before you run out of  medicine. If you are taking this medicine after hip or knee replacement surgery, take it with or without food. If you are taking this medicine for atrial fibrillation, take it with your evening meal. If you are taking this medicine to treat blood clots, take it with food at the same time each day. If you are unable to swallow your tablet, you may crush the tablet and mix it in applesauce. Then, immediately eat the applesauce. You should eat more food right after you eat the applesauce containing the crushed tablet. Talk to your pediatrician regarding the use of this medicine in children. Special care may be needed. Overdosage: If you think you have taken too much of this medicine contact a poison control center or emergency room at once. NOTE: This medicine is only for you. Do not share this medicine with others. What if I miss a dose? If you take your medicine once a day and miss a dose, take the missed dose as soon as you remember. If it is almost time for your next dose, take only that dose. Do not take double or extra doses. If you take your medicine twice a day and miss a dose, take the missed dose immediately. In this instance, 2 tablets may be taken at the same time. The next day you should take 1 tablet twice a day as directed. What may interact with this medicine? Do not take this medicine with any of the following medications:  defibrotide This medicine may also interact with the following medications:  aspirin and aspirin-like medicines  certain antibiotics like erythromycin, azithromycin, and clarithromycin  certain medicines for fungal infections like ketoconazole and itraconazole  certain medicines for irregular heart beat like amiodarone, quinidine, dronedarone  certain medicines for seizures like carbamazepine, phenytoin  certain medicines that treat or prevent blood clots like warfarin, enoxaparin, and dalteparin  conivaptan  felodipine  indinavir  lopinavir;  ritonavir  NSAIDS, medicines for pain and inflammation, like ibuprofen or naproxen  ranolazine  rifampin  ritonavir  SNRIs, medicines for depression, like desvenlafaxine, duloxetine, levomilnacipran, venlafaxine  SSRIs, medicines for depression, like citalopram, escitalopram, fluoxetine, fluvoxamine, paroxetine, sertraline  St. John's wort  verapamil This list may not describe all possible interactions. Give your health care provider a list of all the medicines, herbs, non-prescription drugs, or dietary supplements you use. Also tell them if you smoke, drink alcohol, or use illegal drugs. Some items may interact with your medicine. What should I watch for while using this medicine? Visit your healthcare professional for regular checks on your progress. You may need blood work done while you are taking this medicine. Your condition will be monitored carefully while you are receiving this medicine. It is important not to miss any appointments. Avoid sports and activities that might cause injury while you are using this medicine. Severe falls or injuries can cause unseen bleeding. Be careful when using sharp tools or knives. Consider using an Copy. Take special care brushing or flossing your teeth. Report any injuries, bruising, or red spots on the skin to your healthcare professional. If you are going to need surgery or other procedure, tell your healthcare professional that you are  taking this medicine. Wear a medical ID bracelet or chain. Carry a card that describes your disease and details of your medicine and dosage times. What side effects may I notice from receiving this medicine? Side effects that you should report to your doctor or health care professional as soon as possible:  allergic reactions like skin rash, itching or hives, swelling of the face, lips, or tongue  back pain  redness, blistering, peeling or loosening of the skin, including inside the mouth  signs  and symptoms of bleeding such as bloody or black, tarry stools; red or dark-brown urine; spitting up blood or brown material that looks like coffee grounds; red spots on the skin; unusual bruising or bleeding from the eye, gums, or nose  signs and symptoms of a blood clot such as chest pain; shortness of breath; pain, swelling, or warmth in the leg  signs and symptoms of a stroke such as changes in vision; confusion; trouble speaking or understanding; severe headaches; sudden numbness or weakness of the face, arm or leg; trouble walking; dizziness; loss of coordination Side effects that usually do not require medical attention (report to your doctor or health care professional if they continue or are bothersome):  dizziness  muscle pain This list may not describe all possible side effects. Call your doctor for medical advice about side effects. You may report side effects to FDA at 1-800-FDA-1088. Where should I keep my medicine? Keep out of the reach of children. Store at room temperature between 15 and 30 degrees C (59 and 86 degrees F). Throw away any unused medicine after the expiration date. NOTE: This sheet is a summary. It may not cover all possible information. If you have questions about this medicine, talk to your doctor, pharmacist, or health care provider.  2020 Elsevier/Gold Standard (2018-10-28 09:45:59)  .

## 2019-09-24 NOTE — Chronic Care Management (AMB) (Signed)
Chronic Care Management Pharmacy  Name: Leah Hayden  MRN: [id number] DOB: 08-22-1927  Chief Complaint/ HPI  Leah Hayden,  86 y.o., female presents for their Initial CCM visit with the clinical pharmacist via telephone. Spoke with patient's daughter, [name] [phone]) who manages medications.   PCP : Venia Carbon, MD   Specialists:   Ida Rogue, Cardiology  Virl Axe,  Manages pacemaker  Daylene Katayama, Podiatry  Caregiver concerns: none, patient is doing well right now Allergies: amiodarone (chest discomfort), Eliquis (mild rash), oramycin (rash)  Their chronic conditions include: AFIB, vascular dementia, allergic rhinitis, asthma, GERD, HLD, CKD, B12 deficiency   Office Visits: 09/05/19: [name] - F/u confusion, UA negative, some improvement, no medication changes 09/03/19: AW[facility], [place] - reduced PPI to every other day 07/25/19: [place] - Confusion, edema,  nighttime hallucinations, elevate feet, limit salt intake, increase water, urine culture and labs  Consult Visit: 07/01/19: Daylene Katayama, podiatry - pain due to onychomycosis of toenail  Medications: Outpatient Encounter Medications as of 09/24/2019  Medication Sig  . albuterol (PROVENTIL HFA) 108 (90 BASE) MCG/ACT inhaler Inhale 2 puffs into the lungs every 6 (six) hours as needed for wheezing or shortness of breath.   . cyanocobalamin 500 MCG tablet Take 500 mcg by mouth every other day.   . diltiazem (CARDIZEM) 30 MG tablet TAKE 1 TABLET (30 MG TOTAL) BY MOUTH EVERY 8 (EIGHT) HOURS  . donepezil (ARICEPT) 5 MG tablet TAKE 1 TABLET (5 MG TOTAL) BY MOUTH AT BEDTIME.  Marland Kitchen Fluticasone-Salmeterol (ADVAIR DISKUS) 100-50 MCG/DOSE AEPB Inhale 1 puff into the lungs daily as needed (shortness of breath or wheezing).   . hydroxypropyl methylcellulose / hypromellose (ISOPTO TEARS / GONIOVISC) 2.5 % ophthalmic solution Place 1 drop into both eyes 3 (three) times daily as needed for dry eyes.  Marland Kitchen losartan (COZAAR) 25 MG tablet  TAKE 2 TABLETS BY MOUTH EVERY DAY  . montelukast (SINGULAIR) 10 MG tablet Take 10 mg by mouth every evening.   . Multiple Vitamins-Minerals (CENTRUM SILVER 50+WOMEN) TABS Take 1 tablet by mouth daily.  . nitroGLYCERIN (NITROSTAT) 0.4 MG SL tablet Place 1 tablet (0.4 mg total) under the tongue every 5 (five) minutes as needed for chest pain.  . pantoprazole (PROTONIX) 40 MG tablet Take 1 tablet (40 mg total) by mouth every other day.  . Rivaroxaban (XARELTO) 15 MG TABS tablet Take 1 tablet (15 mg total) by mouth daily with supper.  Marland Kitchen VITAMIN D PO Take 5,000 Units by mouth daily.    No facility-administered encounter medications on file as of 09/24/2019.   Current Diagnosis/Assessment: Goals    . Pharmacy Care Plan     Current Barriers:  . Chronic Disease Management support, education, and care coordination needs related to AFIB, vascular dementia, allergic rhinitis, asthma, GERD, HLD, CKD, B12 deficiency   Pharmacist Clinical Goal(s):  Marland Kitchen Pharmacist will continue to review medication safety, drug interactions, and dosing with kidney function every 6 months . Patient will continue taking medication daily as prescribed . Caregiver/patient will contact pharmacist with any medication needs  Interventions: . Comprehensive medication review performed.  Patient Self Care Activities:  . Patient has a supportive family for assistance with medication needs . Sitter helps with medication dosing Monday through Friday  Initial goal documentation        AFIB  Managed by Dr. Rockey Situ and Caryl Comes  CBC Latest Ref Rng & Units 09/05/2019 07/26/2019 07/25/2019  WBC 4.0 - 10.5 K/uL 6.4 6.6 6.1  Hemoglobin 12.0 -  15.0 g/dL 12.8 13.6 12.3  Hematocrit 36.0 - 46.0 % 38.6 41.3 36.7  Platelets 150.0 - 400.0 K/uL 211.0 204 202.0  CrCl: 34 ml/min  Symptoms: pacemaker in place, when patient has symptoms, caregiver calls Dr. Olin Pia office Patient is currently rate controlled.  Patient has failed these meds in  past: none Patient is currently on the following medications:  Diltiazem 30 mg - one tablet every 8 hours  (BF, 2 PM, bedtime)  Xarelto 15 mg - once daily with evening meal (takes at 2 PM)  We discussed: adherence, only checks heart rate if patient is feeling symptomatic, denies any recent issues, denies history of CVA, Xarelto dosing appropriate for renal function  Plan: Continue current medications ,  Asthma / Allergies  Symptoms: reports it has been a long time since last episode of shortness of breath due to asthma; symptoms were worse when patient was working   Tobacco Status:  Social History   Tobacco Use  Smoking Status Never Smoker  Smokeless Tobacco Never Used   Patient has failed these meds in past: none reported Patient is currently controlled on the following medications:   Albuterol 90 mcg - 2 puffs every 6 hours as needed  Advair Diskus (fluticasone-salmeterol) 100-50 mcg - 1 puff daily as needed  Montelukast 10 mg - 1 tablet daily (2 PM)  Using maintenance inhaler regularly? No   Frequency of rescue inhaler use:  infrequently  We discussed:  Patient only takes montelukast regularly and symptoms are controlled  Plan: Continue current medications ,  Hypertension  Office blood pressures are  BP Readings from Last 3 Encounters:  09/05/19 138/72  09/03/19 134/80  07/26/19 (!) 172/88   BMP Latest Ref Rng & Units 09/05/2019 07/26/2019 07/25/2019  Glucose 70 - 99 mg/dL 119(H) 121(H) 103(H)  BUN 6 - 23 mg/dL 21 23 21   Creatinine 0.40 - 1.20 mg/dL 1.22(H) 1.10(H) 1.13  BUN/Creat Ratio 12 - 28 - - -  Sodium 135 - 145 mEq/L 139 137 139  Potassium 3.5 - 5.1 mEq/L 4.3 4.6 4.3  Chloride 96 - 112 mEq/L 103 101 102  CO2 19 - 32 mEq/L 29 27 31   Calcium 8.4 - 10.5 mg/dL 9.6 9.8 9.5  CrCl: 34 ml/min  GFR: 41 ml/min (09/05/19) BP Goal < 140/90 mmHg Patient has failed these meds in the past: none Patient checks BP at home when feeling symptomatic Patient home BP  readings are ranging: caregiver denies any elevations or low blood pressure Patient is currently controlled on the following medications:   Losartan 25 mg -  1 tablet twice daily (AM, Bedtime)  Plan: Continue current medications   B12 Deficiency   Vitamin B12 (06/15/16) 1,051  Patient has failed these meds in past: none Patient is currently controlled on the following medications:   Vitamin B12 500 mcg - once every other day (lunch - 12 PM)  Plan: Continue current medications  Dementia  Patient has failed these meds in past: none Patient is currently controlled on the following medications:   Donepezil 5 mg - once daily at 2 PM   We discussed:  Reports nighttime anxiety/hallucinations have improved  Plan: Continue current medications   GERD  Patient has failed these meds in past: none Patient is currently controlled on the following medications:   Pantoprazole 40 mg - one tablet every other day   We discussed: Dose was reduced at last AWV, patient has not noticeable a difference in symptoms; caregiver reports PPI was started on it  hospital a few years ago  Plan: Continue current medications  Medication Management:   OTCs: Artificial tears, cranberry pill (UTI prevention), Centrum Silver 50+, tylenol PRN, vitamin D 5000 IU daily, Imodium PRN, Zyrtec rarely  Pharm[facility]  Cost: Xarelto is high during donut hole; doesn't usually qualify for low income programs, will mail Xarelto PAP financial requirements for future reference  Social support: Actuary Joycelyn Schmid): comes M-F 7:30 AM - 2 PM  Adherence: family fills pillbox, sitter gives patient medication during the day and lays out bedtime pills on a napkin (losartan, diltiazem), on weekends no sitter; daughter calls her to remind patient to take medications at bedtime and weekends Offered enhanced pharmacy services (medication synchronization, adherence packaging, delivery coordination), caregiver is interested but would like  to discuss with family first; unsure if patient would understand the packaging system  CCM Follow up: Monday, March 15, 2020 10:00 AM (telephone)  [name], PharmD Clinical Pharmacist [facility] at [facility] [phone]

## 2019-09-29 ENCOUNTER — Telehealth: Payer: Self-pay

## 2019-09-29 DIAGNOSIS — K219 Gastro-esophageal reflux disease without esophagitis: Secondary | ICD-10-CM

## 2019-09-29 DIAGNOSIS — I480 Paroxysmal atrial fibrillation: Secondary | ICD-10-CM

## 2019-09-29 NOTE — Telephone Encounter (Signed)
I would like to request a referral for Leah Hayden to chronic care management pharmacy services focusing on the following conditions:   PAF (paroxysmal atrial fibrillation) ([place]) [I48.0]  GERD [K21.9]  [name], PharmD Clinical Pharmacist [facility] at [facility] [phone]

## 2019-09-30 ENCOUNTER — Other Ambulatory Visit: Payer: Self-pay

## 2019-09-30 ENCOUNTER — Ambulatory Visit (INDEPENDENT_AMBULATORY_CARE_PROVIDER_SITE_OTHER): Payer: PPO | Admitting: *Deleted

## 2019-09-30 ENCOUNTER — Ambulatory Visit (INDEPENDENT_AMBULATORY_CARE_PROVIDER_SITE_OTHER): Payer: PPO | Admitting: Internal Medicine

## 2019-09-30 ENCOUNTER — Ambulatory Visit: Payer: PPO | Admitting: Cardiovascular Disease

## 2019-09-30 ENCOUNTER — Encounter: Payer: Self-pay | Admitting: Internal Medicine

## 2019-09-30 VITALS — BP 132/60 | HR 62 | Ht 68.0 in | Wt 160.0 lb

## 2019-09-30 DIAGNOSIS — Z950 Presence of cardiac pacemaker: Secondary | ICD-10-CM

## 2019-09-30 DIAGNOSIS — I495 Sick sinus syndrome: Secondary | ICD-10-CM

## 2019-09-30 DIAGNOSIS — I480 Paroxysmal atrial fibrillation: Secondary | ICD-10-CM

## 2019-09-30 LAB — CUP PACEART REMOTE DEVICE CHECK
Date Time Interrogation Session: 20210216062225
Implantable Lead Implant Date: 20200207
Implantable Lead Implant Date: 20200207
Implantable Lead Location: 753859
Implantable Lead Location: 753860
Implantable Lead Model: 5076
Implantable Pulse Generator Implant Date: 20200207
Pulse Gen Model: 407145
Pulse Gen Serial Number: 69503042

## 2019-09-30 MED ORDER — LOSARTAN POTASSIUM 25 MG PO TABS: 25.0000 mg | ORAL_TABLET | Freq: Every day | ORAL | 3 refills | Status: DC

## 2019-09-30 NOTE — Progress Notes (Signed)
Patient Care Team: Venia Carbon, MD as PCP - General Rockey Situ, Kathlene November, MD as PCP - Cardiology (Cardiology) Minna Merritts, MD as Consulting Physician (Cardiology) [name], [facility] as Pharmacist (Pharmacist)   HPI  Leah Hayden is a 86 y.o. female seen today in followup for pacing undertaken 2020 for tachybradycardia syndrome with prolonged posttermination pauses, resting bradycardia as well as severe orthostatic hypotension. Post implant detection had demonstrated atrial arrhythmias in the 160 range.  Variable dyspnea.  No chest pain.  No edema.  Fall about 4 months ago.  In the basement.?  Tripped.  Struggles with orthostasis.  Lightheaded in the shower.  waits upon standing.   Date Cr K Hgb  1 /21 1.22 4.3 12.8           Records and Results Reviewed   Past Medical History:  Diagnosis Date  . Allergic rhinitis   . Asthma   . Biceps tendon tear    a. right->s/p surgical repair winter of 2014.  . Diverticulosis of colon   . GERD (gastroesophageal reflux disease)   . Hammer toe of right foot   . Hx of colonic polyps   . HX: breast cancer   . Meniere's disease   . NSTEMI (non-ST elevated myocardial infarction) ([place])    a. 11/2012 in setting of rapid afib;  b. 11/2012 Cath: nl EF, nonobs CAD->Med Rx;  b. 11/2012 Echo: EF 60-65%, mild LVH, mild MR/TR.  . Osteoarthritis   . PAF (paroxysmal atrial fibrillation) ([place])    a. Eliquis and amio initiated 11/2012 in setting of admission with RVR and NSTEMI;  b. Subsequently changed to flecainide and xarelto;  c. 11/2013 Echo: EF 60-65%, mild LVH, mod MR/TR.  Marland Kitchen Sinus brady-tachy syndrome ([place])    a. Post-conversion pause of 7.2 seconds during 11/2012 hospitalization @ [facility]; b. 04/2017 Event Monitor: No significant arrhythmias.    Past Surgical History:  Procedure Laterality Date  . ABDOMINAL HYSTERECTOMY    . CARDIAC CATHETERIZATION  11/2013   [facility]  . [place]   2nd to left toe  .  ESOPHAGOGASTRODUODENOSCOPY  10/1999   inflammation only   . FOREIGN BODY REMOVAL Right 01/02/2013   Procedure: FOREIGN BODY REMOVAL RIGHT INDEX FINGER;  Surgeon: Cammie Sickle., MD;  Location: [place];  Service: Orthopedics;  Laterality: Right;  . HAMMER TOE SURGERY  11/15   Dr Milinda Pointer  . MASTECTOMY  1984   left   . MASTECTOMY  1985   right  . PACEMAKER IMPLANT N/A 09/20/2018   Procedure: PACEMAKER IMPLANT;  Surgeon: Deboraha Sprang, MD;  Location: [place];  Service: Cardiovascular;  Laterality: N/A;  . PARTIAL HYSTERECTOMY  1966   w/ bladder tack  . ROTATOR CUFF REPAIR  10/09   right   . TOE SURGERY  2001/2002   2nd/3rd,left     Current Meds  Medication Sig  . albuterol (PROVENTIL HFA) 108 (90 BASE) MCG/ACT inhaler Inhale 2 puffs into the lungs every 6 (six) hours as needed for wheezing or shortness of breath.   . cyanocobalamin 500 MCG tablet Take 500 mcg by mouth every other day.   . diltiazem (CARDIZEM) 30 MG tablet TAKE 1 TABLET (30 MG TOTAL) BY MOUTH EVERY 8 (EIGHT) HOURS  . donepezil (ARICEPT) 5 MG tablet TAKE 1 TABLET (5 MG TOTAL) BY MOUTH AT BEDTIME.  Marland Kitchen Fluticasone-Salmeterol (ADVAIR DISKUS) 100-50 MCG/DOSE AEPB Inhale 1 puff into the  lungs daily as needed (shortness of breath or wheezing).   . hydroxypropyl methylcellulose / hypromellose (ISOPTO TEARS / GONIOVISC) 2.5 % ophthalmic solution Place 1 drop into both eyes 3 (three) times daily as needed for dry eyes.  Marland Kitchen loperamide (IMODIUM) 2 MG capsule Take by mouth as needed for diarrhea or loose stools.  Marland Kitchen losartan (COZAAR) 25 MG tablet TAKE 2 TABLETS BY MOUTH EVERY DAY (Patient taking differently: Taking 1 tablet twice daily (with breakfast and bedtime))  . montelukast (SINGULAIR) 10 MG tablet Take 10 mg by mouth every evening.   . Multiple Vitamins-Minerals (CENTRUM SILVER 50+WOMEN) TABS Take 1 tablet by mouth daily.  . nitroGLYCERIN (NITROSTAT) 0.4 MG SL tablet Place 1 tablet (0.4 mg total)  under the tongue every 5 (five) minutes as needed for chest pain.  . pantoprazole (PROTONIX) 40 MG tablet Take 1 tablet (40 mg total) by mouth every other day.  . Rivaroxaban (XARELTO) 15 MG TABS tablet Take 1 tablet (15 mg total) by mouth daily with supper.  Marland Kitchen VITAMIN D PO Take 5,000 Units by mouth daily.     Allergies  Allergen Reactions  . Amiodarone Other (See Comments)    Chest discomfort  . Eliquis [Apixaban] Rash  . Other Rash    oramycin      Review of Systems negative except from HPI and PMH  Physical Exam BP 132/60 (BP Location: Right Arm, Patient Position: Sitting, Cuff Size: Normal)   Pulse 62   Ht 5\' 8"  (1.727 m)   Wt 160 lb (72.6 kg)   BMI 24.33 kg/m  Well developed and well nourished in no acute distress HENT normal Neck supple with JVP-flat Clear Device pocket well healed; without hematoma or erythema.  There is no tethering  Regular rate and rhythm, no   murmur Abd-soft with active BS No Clubbing cyanosis   edema Skin-warm and dry A & Oriented  Grossly normal sensory and motor function  ECG apacing  @ 62 18/09/42   Assessment and  Plan Atrial tachycardia/fibrillation with posttermination pauses  Ventricular tachycardia nonsustained  Orthostatic hypotension  Falls  Tachybradycardia syndrome  Pacemaker-Biotronik  The patient's device was interrogated.  The information was reviewed. No changes were made in the programming.     Discussed fall prevention and the role of orthostasis and relationship to BP systolic.  She doesn't awaken at night so will decrease her losartan 50>>25 mg and have her take at night  Asked to follow her BP at home  On Anticoagulation;  No bleeding issues

## 2019-09-30 NOTE — Patient Instructions (Signed)
Medication Instructions:  - Your physician has recommended you make the following change in your medication:   1) Decrease cozaar (losartan) to 25 mg- take 1 tablet by mouth once daily at bedtime   *If you need a refill on your cardiac medications before your next appointment, please call your pharmacy*  Lab Work: - none ordered  If you have labs (blood work) drawn today and your tests are completely normal, you will receive your results only by: Marland Kitchen MyChart Message (if you have MyChart) OR . A paper copy in the mail If you have any lab test that is abnormal or we need to change your treatment, we will call you to review the results.  Testing/Procedures: - none ordered  Follow-Up: At [facility], you and your health needs are our priority.  As part of our continuing mission to provide you with exceptional heart care, we have created designated Provider Care Teams.  These Care Teams include your primary Cardiologist (physician) and Advanced Practice Providers (APPs -  Physician Assistants and Nurse Practitioners) who all work together to provide you with the care you need, when you need it.  Your next appointment:   1 year(s)  The format for your next appointment:   In Person  Provider:   Virl Axe, MD  Other Instructions n/a

## 2019-10-01 NOTE — Progress Notes (Signed)
PPM Remote  

## 2019-10-05 ENCOUNTER — Ambulatory Visit: Payer: PPO | Attending: Internal Medicine

## 2019-10-05 DIAGNOSIS — Z23 Encounter for immunization: Secondary | ICD-10-CM | POA: Insufficient documentation

## 2019-10-05 NOTE — Progress Notes (Signed)
   Covid-19 Vaccination Clinic  Name:  Leah Hayden    MRN: [id number] DOB: November 04, 1927  10/05/2019  [name] was observed post Covid-19 immunization for 15 minutes without incidence. She was provided with Vaccine Information Sheet and instruction to access the V-Safe system.   [name] was instructed to call 911 with any severe reactions post vaccine: Marland Kitchen Difficulty breathing  . Swelling of your face and throat  . A fast heartbeat  . A bad rash all over your body  . Dizziness and weakness    Immunizations Administered    Name Date Dose VIS Date Route   Pfizer COVID-19 Vaccine 10/05/2019 10:30 AM 0.3 mL 07/25/2019 Intramuscular   Manufacturer: [place]   Lot: [id number]   [place]: [id number]

## 2019-10-07 NOTE — Telephone Encounter (Signed)
Corrected referral I originally placed last week. I did not put the right information for the referral.

## 2019-10-10 ENCOUNTER — Telehealth: Payer: Self-pay | Admitting: Cardiovascular Disease

## 2019-10-10 NOTE — Telephone Encounter (Signed)
Spoke with Leah Hayden on record.  Advised last report on PM was at 2358 last night.  No abnormal rhythm.  She reports it was this AM that pt was not acting herself.  She reports pt is at baseline now wth no concerns.  Advised can check again on Monday if report shows new rhythm however it will only show if rhythm met criteria.  Stressed getting care for pt if change in condition.

## 2019-10-10 NOTE — Telephone Encounter (Signed)
Patients sister calling in stating patient was very weak this morning. Patients sister did not suspect a fib this morning but curious if could have been through the night. Patients sister wondering if the device clinic can look at her pace maker monitor and let them know if anything can be seen  Please advise

## 2019-10-24 ENCOUNTER — Other Ambulatory Visit: Payer: Self-pay | Admitting: Physician Assistant

## 2019-10-28 ENCOUNTER — Encounter: Payer: Self-pay | Admitting: Family Medicine

## 2019-10-28 ENCOUNTER — Other Ambulatory Visit: Payer: Self-pay

## 2019-10-28 ENCOUNTER — Ambulatory Visit (INDEPENDENT_AMBULATORY_CARE_PROVIDER_SITE_OTHER): Payer: PPO | Admitting: Family Medicine

## 2019-10-28 VITALS — BP 128/60 | HR 62 | Temp 98.0°F | Ht 65.5 in | Wt 154.0 lb

## 2019-10-28 DIAGNOSIS — N3000 Acute cystitis without hematuria: Secondary | ICD-10-CM | POA: Diagnosis not present

## 2019-10-28 DIAGNOSIS — R410 Disorientation, unspecified: Secondary | ICD-10-CM

## 2019-10-28 DIAGNOSIS — L84 Corns and callosities: Secondary | ICD-10-CM | POA: Diagnosis not present

## 2019-10-28 LAB — POCT URINALYSIS DIPSTICK
Bilirubin, UA: NEGATIVE
Blood, UA: POSITIVE
Glucose, UA: NEGATIVE
Ketones, UA: POSITIVE
Nitrite, UA: NEGATIVE
Protein, UA: NEGATIVE
Spec Grav, UA: 1.02 (ref 1.010–1.025)
Urobilinogen, UA: 0.2 E.U./dL
pH, UA: 6 (ref 5.0–8.0)

## 2019-10-28 MED ORDER — CEPHALEXIN 500 MG PO CAPS: 500.0000 mg | ORAL_CAPSULE | Freq: Two times a day (BID) | ORAL | 0 refills | Status: AC

## 2019-10-28 NOTE — Assessment & Plan Note (Signed)
Pt presented with confusion in December and had similar UA. Initially not started on antibiotics and ended up in the ER with worsening symptoms with UCx showing infection and symptom improvement on abx. Does not have urinary symptoms and it is possible this could be contaminate vs chronic Colonizer however given similar to last presentation and improvement with abx will start treatment now and stop if symptoms don't improve and UCx negative. Advised returned visit if worsening confusion.

## 2019-10-28 NOTE — Patient Instructions (Addendum)
Take the antibiotic  Return if confusion worsens Results should be back for culture in a few days   Salicylic acid patches (used to treat warts) can use  Wear most comfortable shoes that don't rub

## 2019-10-28 NOTE — Progress Notes (Signed)
Subjective:     Leah Hayden is a 86 y.o. female presenting for Altered Mental Status (more confused. No urinary issues.)     HPI  #Pinky toe - thinks the shoe is rubbing to the toe - has a small rough lesion - sees podiatry every 2 months - has had the lesion for a while and has not been bothering her until recenly - wearing bedroom shoes around the house  #episode last week - wet her bed, tried to clean up the mess - couldn't get to the bathroom - felt weak for a few hours - she was in good rhythm, BP was high was improved in 30 minutes - not sure if something caused worsening confusion - happened last Wednesday - has noticed some more confusion since then - no breathing difficulty - no chest pain - is having more trouble hearing and with eye sight - no stomach pain currently - but occasionally with certain foods - no nausea/vomiting - nose with dripping  - no urinary symptoms - no pain or burning - wears a pad a night, but during the day is continent of urine - no frequency - endorses possible increased urgency -- though this may be more chronic  Confusion - not using the phone like she would normally, went the neighbors to say the phone wasn't working -- but it was working fine. More trouble doing her pills  Worse over the last few days Concerned b/c she is suppose to get her 2nd covid shot  Review of Systems  07/25/2019: Clinic - UTI - not definitive with keflex but improved on this  Social History   Tobacco Use  Smoking Status Never Smoker  Smokeless Tobacco Never Used        Objective:    BP Readings from Last 3 Encounters:  10/28/19 128/60  09/30/19 132/60  09/05/19 138/72   Wt Readings from Last 3 Encounters:  10/28/19 154 lb (69.9 kg)  09/30/19 160 lb (72.6 kg)  09/05/19 153 lb (69.4 kg)    BP 128/60   Pulse 62   Temp 98 F (36.7 C)   Ht 5' 5.5" (1.664 m)   Wt 154 lb (69.9 kg)   BMI 25.24 kg/m    Physical Exam Constitutional:       General: She is not in acute distress.    Appearance: She is well-developed. She is not diaphoretic.     Comments: Occasionally needs questions repeated due to hearing  HENT:     Right Ear: External ear normal.     Left Ear: External ear normal.     Nose: Nose normal.  Eyes:     Conjunctiva/sclera: Conjunctivae normal.  Cardiovascular:     Rate and Rhythm: Normal rate and regular rhythm.     Heart sounds: Murmur present.  Pulmonary:     Effort: Pulmonary effort is normal. No respiratory distress.     Breath sounds: Normal breath sounds. No wheezing.  Abdominal:     General: Abdomen is flat. Bowel sounds are normal.     Tenderness: There is no abdominal tenderness.  Musculoskeletal:     Cervical back: Neck supple.  Skin:    General: Skin is warm and dry.     Capillary Refill: Capillary refill takes less than 2 seconds.     Comments: Left fifth toe with small <5 mm firm rough skin colored papule.   Neurological:     General: No focal deficit present.     Mental  Status: She is alert. Mental status is at baseline.     Comments: Appropriate responses  Psychiatric:        Mood and Affect: Mood normal.        Behavior: Behavior normal.    UA: +LE, neg nitrites       Assessment & Plan:   Problem List Items Addressed This Visit      Nervous and Auditory   Confusion - Primary    Pt presented with confusion in December and had similar UA. Initially not started on antibiotics and ended up in the ER with worsening symptoms with UCx showing infection and symptom improvement on abx. Does not have urinary symptoms and it is possible this could be contaminate vs chronic Colonizer however given similar to last presentation and improvement with abx will start treatment now and stop if symptoms don't improve and UCx negative. Advised returned visit if worsening confusion.       Relevant Medications   cephALEXin (KEFLEX) 500 MG capsule   Other Relevant Orders   POCT urinalysis  dipstick (Completed)   Urine Culture     Musculoskeletal and Integument   Corn of toe    Toe concern seems most consistent with a small corn. Advised wearing better fitting shoes and could consider salicylic acid treatment vs waiting for regular podiatry evaluation.        Other Visit Diagnoses    Acute cystitis without hematuria       Relevant Medications   cephALEXin (KEFLEX) 500 MG capsule       Return if symptoms worsen or fail to improve.  Lesleigh Noe, MD

## 2019-10-28 NOTE — Assessment & Plan Note (Signed)
Toe concern seems most consistent with a small corn. Advised wearing better fitting shoes and could consider salicylic acid treatment vs waiting for regular podiatry evaluation.

## 2019-10-29 ENCOUNTER — Ambulatory Visit: Payer: PPO | Attending: Internal Medicine

## 2019-10-29 DIAGNOSIS — Z23 Encounter for immunization: Secondary | ICD-10-CM

## 2019-10-29 NOTE — Progress Notes (Signed)
   Covid-19 Vaccination Clinic  Name:  Leah Hayden    MRN: [id number] DOB: [birth year]  10/29/2019  [name] was observed post Covid-19 immunization for 15 minutes without incident. She was provided with Vaccine Information Sheet and instruction to access the V-Safe system.   [name] was instructed to call 911 with any severe reactions post vaccine: Marland Kitchen Difficulty breathing  . Swelling of face and throat  . A fast heartbeat  . A bad rash all over body  . Dizziness and weakness   Immunizations Administered    Name Date Dose VIS Date Route   Pfizer COVID-19 Vaccine 10/29/2019 10:31 AM 0.3 mL 07/25/2019 Intramuscular   Manufacturer: [place]   Lot: [id number]   [place]: [id number]

## 2019-10-30 LAB — URINE CULTURE
MICRO NUMBER:: 10256859
SPECIMEN QUALITY:: ADEQUATE

## 2019-11-07 ENCOUNTER — Telehealth: Payer: Self-pay

## 2019-11-07 NOTE — Telephone Encounter (Signed)
Given initial improvement and that symptoms started back after completion of antibiotics I would recommend re-evaluation.

## 2019-11-07 NOTE — Telephone Encounter (Signed)
[name] advised. They are trying to make sure patient drinking more water. They will see how patient does over the weekend and if any concerns still present next week they will call back. Also encouraged going to urgent care over the weekend if patient starts to feel worse

## 2019-11-07 NOTE — Telephone Encounter (Signed)
Leah Hayden (DPR signed) said pt was seen on 10/28/19 and given abx; pt finished abx on 11/03/19 and confusion was better (pt is confused all the time) but Leah Hayden thinks pt is more confused today and her urine is a darker yellow but pt did not drink a lot on 11/06/19. No fever.Leah Hayden was told from urine culture report pt may need different abx if condition not improves. Leah Hayden said difficult to get pt to office due to Leah Hayden having back problems and there are no available appts at [facility] this afternoon. Leah Hayden request abx to [facility]. Pt has no covid symptoms, no travel and no known exposure to + covid. Note to Dr Einar Pheasant and Azalee Course CMA will message Dr Einar Pheasant about this call since Dr Einar Pheasant is not in office. Dr [name] out of office this afternoon.

## 2019-11-10 DIAGNOSIS — H6123 Impacted cerumen, bilateral: Secondary | ICD-10-CM | POA: Diagnosis not present

## 2019-11-10 DIAGNOSIS — H903 Sensorineural hearing loss, bilateral: Secondary | ICD-10-CM | POA: Diagnosis not present

## 2019-11-10 NOTE — Telephone Encounter (Signed)
Spoke to [place]. She has been telling her to drink more. Confusion is getting worse.

## 2019-11-10 NOTE — Telephone Encounter (Signed)
Patient's daughter-in-law called stating that she really thinks that she needs to bring patient in to have her urine checked again. [name] stated that Mrs. Thune finished the antibiotic last Monday. [name] stated that patient's urine was normal in color yesterday, but dark again today. [name] stated that Mrs. Krehbiel is confused and not sure if she could still have a UTI and does not want to overlook anything. Patient scheduled to see Dr. [name] tomorrow 11/11/19. Patient had a negative covid screening.

## 2019-11-10 NOTE — Telephone Encounter (Signed)
We can check again but dark urine is an indication of concentrated urine--not infection. Still worth checking again Have them try to encourage her to drink more

## 2019-11-11 ENCOUNTER — Ambulatory Visit (INDEPENDENT_AMBULATORY_CARE_PROVIDER_SITE_OTHER): Payer: PPO | Admitting: Internal Medicine

## 2019-11-11 ENCOUNTER — Other Ambulatory Visit: Payer: Self-pay

## 2019-11-11 ENCOUNTER — Encounter: Payer: Self-pay | Admitting: Internal Medicine

## 2019-11-11 DIAGNOSIS — R8290 Unspecified abnormal findings in urine: Secondary | ICD-10-CM

## 2019-11-11 LAB — POC URINALSYSI DIPSTICK (AUTOMATED)
Bilirubin, UA: NEGATIVE
Blood, UA: POSITIVE
Glucose, UA: NEGATIVE
Ketones, UA: NEGATIVE
Nitrite, UA: NEGATIVE
Protein, UA: NEGATIVE
Spec Grav, UA: 1.02 (ref 1.010–1.025)
Urobilinogen, UA: 0.2 E.U./dL
pH, UA: 6 (ref 5.0–8.0)

## 2019-11-11 NOTE — Addendum Note (Signed)
Addended by: Pilar Grammes on: 11/11/2019 12:23 PM   Modules accepted: Orders

## 2019-11-11 NOTE — Progress Notes (Signed)
Subjective:    Patient ID: Leah Hayden, female    DOB: September 14, 1927, 86 y.o.   MRN: [id number]  HPI  Here with daughter due to ongoing concerns This visit occurred during the SARS-CoV-2 public health emergency.  Safety protocols were in place, including screening questions prior to the visit, additional usage of staff PPE, and extensive cleaning of exam room while observing appropriate contact time as indicated for disinfecting solutions.   Had abnormal urinalysis on 3/16 Started on keflex and took for 7 days Finished it up last week Then they noticed dark urine 4 days ago--wasn't drinking as much Did improve then  Still some confusion Seems to be more at night Thinks she is not at home sometimes (or that someone has used the furniture)---or may thing she needs some one to pick her up to take her home Loses train of thought when talking  No fever No dysuria or hematuria Occasional urgency and incontinence  Current Outpatient Medications on File Prior to Visit  Medication Sig Dispense Refill  . albuterol (PROVENTIL HFA) 108 (90 BASE) MCG/ACT inhaler Inhale 2 puffs into the lungs every 6 (six) hours as needed for wheezing or shortness of breath.     . cyanocobalamin 500 MCG tablet Take 500 mcg by mouth every other day.     . diltiazem (CARDIZEM) 30 MG tablet TAKE 1 TABLET (30 MG TOTAL) BY MOUTH EVERY 8 (EIGHT) HOURS 270 tablet 0  . donepezil (ARICEPT) 5 MG tablet TAKE 1 TABLET (5 MG TOTAL) BY MOUTH AT BEDTIME. 90 tablet 3  . Fluticasone-Salmeterol (ADVAIR DISKUS) 100-50 MCG/DOSE AEPB Inhale 1 puff into the lungs daily as needed (shortness of breath or wheezing).     . hydroxypropyl methylcellulose / hypromellose (ISOPTO TEARS / GONIOVISC) 2.5 % ophthalmic solution Place 1 drop into both eyes 3 (three) times daily as needed for dry eyes.    Marland Kitchen loperamide (IMODIUM) 2 MG capsule Take by mouth as needed for diarrhea or loose stools.    Marland Kitchen losartan (COZAAR) 25 MG tablet Take 1 tablet (25 mg  total) by mouth daily. 90 tablet 3  . montelukast (SINGULAIR) 10 MG tablet Take 10 mg by mouth every evening.     . Multiple Vitamins-Minerals (CENTRUM SILVER 50+WOMEN) TABS Take 1 tablet by mouth daily.    . nitroGLYCERIN (NITROSTAT) 0.4 MG SL tablet Place 1 tablet (0.4 mg total) under the tongue every 5 (five) minutes as needed for chest pain. 25 tablet 1  . pantoprazole (PROTONIX) 40 MG tablet Take 1 tablet (40 mg total) by mouth every other day.    . Rivaroxaban (XARELTO) 15 MG TABS tablet Take 1 tablet (15 mg total) by mouth daily with supper. 90 tablet 3  . VITAMIN D PO Take 5,000 Units by mouth daily.      No current facility-administered medications on file prior to visit.    Allergies  Allergen Reactions  . Amiodarone Other (See Comments)    Chest discomfort  . Eliquis [Apixaban] Rash  . Other Rash    oramycin    Past Medical History:  Diagnosis Date  . Allergic rhinitis   . Asthma   . Biceps tendon tear    a. right->s/p surgical repair winter of 2014.  . Diverticulosis of colon   . GERD (gastroesophageal reflux disease)   . Hammer toe of right foot   . Hx of colonic polyps   . HX: breast cancer   . Meniere's disease   . NSTEMI (non-ST elevated  myocardial infarction) ([place])    a. 11/2012 in setting of rapid afib;  b. 11/2012 Cath: nl EF, nonobs CAD->Med Rx;  b. 11/2012 Echo: EF 60-65%, mild LVH, mild MR/TR.  . Osteoarthritis   . PAF (paroxysmal atrial fibrillation) ([place])    a. Eliquis and amio initiated 11/2012 in setting of admission with RVR and NSTEMI;  b. Subsequently changed to flecainide and xarelto;  c. 11/2013 Echo: EF 60-65%, mild LVH, mod MR/TR.  Marland Kitchen Sinus brady-tachy syndrome ([place])    a. Post-conversion pause of 7.2 seconds during 11/2012 hospitalization @ [facility]; b. 04/2017 Event Monitor: No significant arrhythmias.    Past Surgical History:  Procedure Laterality Date  . ABDOMINAL HYSTERECTOMY    . CARDIAC CATHETERIZATION  11/2013   [facility]  . [place]   2nd to left toe  . ESOPHAGOGASTRODUODENOSCOPY  10/1999   inflammation only   . FOREIGN BODY REMOVAL Right 01/02/2013   Procedure: FOREIGN BODY REMOVAL RIGHT INDEX FINGER;  Surgeon: Cammie Sickle., MD;  Location: [place];  Service: Orthopedics;  Laterality: Right;  . HAMMER TOE SURGERY  11/15   Dr Milinda Pointer  . MASTECTOMY  1984   left   . MASTECTOMY  1985   right  . PACEMAKER IMPLANT N/A 09/20/2018   Procedure: PACEMAKER IMPLANT;  Surgeon: Deboraha Sprang, MD;  Location: [facility];  Service: Cardiovascular;  Laterality: N/A;  . PARTIAL HYSTERECTOMY  1966   w/ bladder tack  . ROTATOR CUFF REPAIR  10/09   right   . TOE SURGERY  2001/2002   2nd/3rd,left     Family History  Problem Relation Age of Onset  . Heart failure Mother   . Gout Mother   . Heart disease Mother        heart failure  . Arthritis Mother   . Heart failure Father   . Heart disease Father        heart failure  . Diabetes Maternal Grandmother   . Colon cancer Maternal Grandfather   . Cancer Maternal Grandfather        colon  . Arthritis Brother     Social History   Socioeconomic History  . Marital status: Widowed    Spouse name: Not on file  . Number of children: 2  . Years of education: Not on file  . Highest education level: Not on file  Occupational History  . 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 Smoking status: Never Smoker  . Smokeless tobacco: Never Used  Substance and Sexual Activity  . Alcohol use: No    Alcohol/week: 0.0 standard drinks  . Drug use: No  . Sexual activity: Not on file  Other Topics Concern  . Not on file  Social History Narrative   Now has living will   Sons are health care POAs   Has DNR   No tube feeds if cognitively unaware   Social Determinants of Health   Financial Resource Strain:   . Difficulty of Paying Living Expenses:   Food Insecurity:   . Worried About Charity fundraiser in  the Last Year:   . Arboriculturist in the Last Year:   Transportation Needs:   . Film/video editor (Medical):   Marland Kitchen Lack of Transportation (Non-Medical):   Physical Activity:   . Days of Exercise per Week:   . Minutes of Exercise per Session:   Stress:   .  Feeling of Stress :   Social Connections:   . Frequency of Communication with Friends and Family:   . Frequency of Social Gatherings with Friends and Family:   . Attends Religious Services:   . Active Member of Clubs or Organizations:   . Attends Archivist Meetings:   Marland Kitchen Marital Status:   Intimate Partner Violence:   . Fear of Current or Ex-Partner:   . Emotionally Abused:   Marland Kitchen Physically Abused:   . Sexually Abused:    Review of Systems Appetite is okay--not a big appetite Weight is stable Sleeping okay Cerumen removed by Dr Pryor Ochoa yesterday    Objective:   Physical Exam  Constitutional: No distress.  GI:  No suprapubic tenderness  Neurological:  Alert but passive Does answer direct questions           Assessment & Plan:

## 2019-11-11 NOTE — Assessment & Plan Note (Signed)
Discussed the uncertainties with daughter Her confusion, etc is typical of dementia---not UTI No delirium No specific urinary symptoms---other than signs of increased concentration  Now ready to get out more---2 weeks from COVID vaccine #2 Will monitor her status If any dysuria/hematuria/fever--will treat

## 2019-11-14 ENCOUNTER — Telehealth: Payer: Self-pay | Admitting: Internal Medicine

## 2019-11-14 NOTE — Telephone Encounter (Signed)
  1. Has your device fired? no  2. Is you device beeping? no  3. Are you experiencing draining or swelling at device site?   4. Are you calling to see if we received your device transmission? Calling to see if patient had any episodes of a fib last night. Patient woke up very weak.   5. Have you passed out? No  Please advise when able    Please route to [place]

## 2019-11-14 NOTE — Telephone Encounter (Signed)
Spoke with patient's son, Ray (DPR). Advised that as of automatic nightly transmission from 11/14/19 at 01:02, no atrial arrhythmia episodes noted since 08/2019. Ray verbalized understanding and appreciation of call back.

## 2019-11-25 DIAGNOSIS — X32XXXA Exposure to sunlight, initial encounter: Secondary | ICD-10-CM | POA: Diagnosis not present

## 2019-11-25 DIAGNOSIS — L570 Actinic keratosis: Secondary | ICD-10-CM | POA: Diagnosis not present

## 2019-11-25 DIAGNOSIS — Z85828 Personal history of other malignant neoplasm of skin: Secondary | ICD-10-CM | POA: Diagnosis not present

## 2019-11-25 DIAGNOSIS — D2262 Melanocytic nevi of left upper limb, including shoulder: Secondary | ICD-10-CM | POA: Diagnosis not present

## 2019-11-25 DIAGNOSIS — D2261 Melanocytic nevi of right upper limb, including shoulder: Secondary | ICD-10-CM | POA: Diagnosis not present

## 2019-11-25 DIAGNOSIS — D2271 Melanocytic nevi of right lower limb, including hip: Secondary | ICD-10-CM | POA: Diagnosis not present

## 2019-11-25 DIAGNOSIS — D3615 Benign neoplasm of peripheral nerves and autonomic nervous system of abdomen: Secondary | ICD-10-CM | POA: Diagnosis not present

## 2019-11-25 DIAGNOSIS — L821 Other seborrheic keratosis: Secondary | ICD-10-CM | POA: Diagnosis not present

## 2019-12-02 ENCOUNTER — Encounter: Payer: Self-pay | Admitting: Podiatry

## 2019-12-02 ENCOUNTER — Ambulatory Visit: Payer: PPO | Admitting: Podiatry

## 2019-12-02 ENCOUNTER — Other Ambulatory Visit: Payer: Self-pay

## 2019-12-02 DIAGNOSIS — M79676 Pain in unspecified toe(s): Secondary | ICD-10-CM | POA: Diagnosis not present

## 2019-12-02 DIAGNOSIS — B351 Tinea unguium: Secondary | ICD-10-CM | POA: Diagnosis not present

## 2019-12-02 DIAGNOSIS — L989 Disorder of the skin and subcutaneous tissue, unspecified: Secondary | ICD-10-CM

## 2019-12-04 NOTE — Progress Notes (Signed)
    Subjective: Patient is a 86 y.o. female presenting to the office today for follow up evaluation of painful callus lesion(s) noted to the bilateral feet. Walking and bearing weight increases the pain. She has not had any recent treatment for the symptoms.  Patient also complains of elongated, thickened nails that cause pain while ambulating in shoes. She is unable to trim her own nails. Patient presents today for further treatment and evaluation.  Past Medical History:  Diagnosis Date  . Allergic rhinitis   . Asthma   . Biceps tendon tear    a. right->s/p surgical repair winter of 2014.  . Diverticulosis of colon   . GERD (gastroesophageal reflux disease)   . Hammer toe of right foot   . Hx of colonic polyps   . HX: breast cancer   . Meniere's disease   . NSTEMI (non-ST elevated myocardial infarction) ([place])    a. 11/2012 in setting of rapid afib;  b. 11/2012 Cath: nl EF, nonobs CAD->Med Rx;  b. 11/2012 Echo: EF 60-65%, mild LVH, mild MR/TR.  . Osteoarthritis   . PAF (paroxysmal atrial fibrillation) ([place])    a. Eliquis and amio initiated 11/2012 in setting of admission with RVR and NSTEMI;  b. Subsequently changed to flecainide and xarelto;  c. 11/2013 Echo: EF 60-65%, mild LVH, mod MR/TR.  Marland Kitchen Sinus brady-tachy syndrome ([place])    a. Post-conversion pause of 7.2 seconds during 11/2012 hospitalization @ [facility]; b. 04/2017 Event Monitor: No significant arrhythmias.    Objective:  Physical Exam General: Alert and oriented x3 in no acute distress  Dermatology: Hyperkeratotic lesion(s) present on the bilateral feet. Pain on palpation with a central nucleated core noted. Skin is warm, dry and supple bilateral lower extremities. Negative for open lesions or macerations. Nails are tender, long, thickened and dystrophic with subungual debris, consistent with onychomycosis, 1-5 bilateral. No signs of infection noted.  Vascular: Palpable pedal pulses bilaterally. No edema or erythema noted. Capillary  refill within normal limits.  Neurological: Epicritic and protective threshold grossly intact bilaterally.   Musculoskeletal Exam: Pain on palpation at the keratotic lesion(s) noted. Range of motion within normal limits bilateral. Muscle strength 5/5 in all groups bilateral.  Assessment: 1. Onychodystrophic nails 1-5 bilateral with hyperkeratosis of nails.  2. Onychomycosis of nail due to dermatophyte bilateral 3. Pre-ulcerative callus lesions noted to the bilateral feet x 2   Plan of Care:  1. Patient evaluated. 2. Excisional debridement of keratoic lesion(s) using a chisel blade was performed without incident.  3. Dressed with light dressing. 4. Mechanical debridement of nails 1-5 bilaterally performed using a nail nipper. Filed with dremel without incident.  5. Patient is to return to the clinic in 3 months.   Edrick Kins, DPM [facility]  Dr. Edrick Kins, [address]                                        [place], [place] [address]                Office [phone]  Fax [phone]

## 2019-12-09 ENCOUNTER — Other Ambulatory Visit: Payer: Self-pay

## 2019-12-09 ENCOUNTER — Encounter: Payer: Self-pay | Admitting: Emergency Medicine

## 2019-12-09 ENCOUNTER — Telehealth: Payer: Self-pay | Admitting: Internal Medicine

## 2019-12-09 ENCOUNTER — Emergency Department: Payer: PPO

## 2019-12-09 ENCOUNTER — Emergency Department
Admission: EM | Admit: 2019-12-09 | Discharge: 2019-12-09 | Disposition: A | Discharge status: Home / Self Care | Payer: PPO | Attending: Student | Admitting: Student

## 2019-12-09 DIAGNOSIS — I10 Essential (primary) hypertension: Secondary | ICD-10-CM | POA: Diagnosis not present

## 2019-12-09 DIAGNOSIS — R110 Nausea: Secondary | ICD-10-CM | POA: Diagnosis not present

## 2019-12-09 DIAGNOSIS — J45909 Unspecified asthma, uncomplicated: Secondary | ICD-10-CM | POA: Insufficient documentation

## 2019-12-09 DIAGNOSIS — R0789 Other chest pain: Secondary | ICD-10-CM | POA: Diagnosis not present

## 2019-12-09 DIAGNOSIS — Z79899 Other long term (current) drug therapy: Secondary | ICD-10-CM | POA: Diagnosis not present

## 2019-12-09 DIAGNOSIS — J90 Pleural effusion, not elsewhere classified: Secondary | ICD-10-CM | POA: Diagnosis not present

## 2019-12-09 DIAGNOSIS — I2510 Atherosclerotic heart disease of native coronary artery without angina pectoris: Secondary | ICD-10-CM | POA: Insufficient documentation

## 2019-12-09 DIAGNOSIS — F0390 Unspecified dementia without behavioral disturbance: Secondary | ICD-10-CM | POA: Diagnosis not present

## 2019-12-09 DIAGNOSIS — R1084 Generalized abdominal pain: Secondary | ICD-10-CM | POA: Diagnosis not present

## 2019-12-09 DIAGNOSIS — N183 Chronic kidney disease, stage 3 unspecified: Secondary | ICD-10-CM | POA: Diagnosis not present

## 2019-12-09 DIAGNOSIS — R079 Chest pain, unspecified: Secondary | ICD-10-CM | POA: Diagnosis not present

## 2019-12-09 DIAGNOSIS — R109 Unspecified abdominal pain: Secondary | ICD-10-CM | POA: Insufficient documentation

## 2019-12-09 DIAGNOSIS — R531 Weakness: Secondary | ICD-10-CM | POA: Diagnosis not present

## 2019-12-09 DIAGNOSIS — R410 Disorientation, unspecified: Secondary | ICD-10-CM | POA: Diagnosis not present

## 2019-12-09 DIAGNOSIS — Z950 Presence of cardiac pacemaker: Secondary | ICD-10-CM | POA: Insufficient documentation

## 2019-12-09 LAB — CBC
HCT: 39.7 % (ref 36.0–46.0)
Hemoglobin: 13.4 g/dL (ref 12.0–15.0)
MCH: 31.2 pg (ref 26.0–34.0)
MCHC: 33.8 g/dL (ref 30.0–36.0)
MCV: 92.3 fL (ref 80.0–100.0)
Platelets: 194 10*3/uL (ref 150–400)
RBC: 4.3 MIL/uL (ref 3.87–5.11)
RDW: 12.8 % (ref 11.5–15.5)
WBC: 4.7 10*3/uL (ref 4.0–10.5)
nRBC: 0 % (ref 0.0–0.2)

## 2019-12-09 LAB — BASIC METABOLIC PANEL
Anion gap: 8 (ref 5–15)
BUN: 26 mg/dL — ABNORMAL HIGH (ref 8–23)
CO2: 27 mmol/L (ref 22–32)
Calcium: 9.7 mg/dL (ref 8.9–10.3)
Chloride: 107 mmol/L (ref 98–111)
Creatinine, Ser: 1.05 mg/dL — ABNORMAL HIGH (ref 0.44–1.00)
GFR calc Af Amer: 55 mL/min — ABNORMAL LOW (ref >60)
GFR calc non Af Amer: 47 mL/min — ABNORMAL LOW (ref >60)
Glucose, Bld: 108 mg/dL — ABNORMAL HIGH (ref 70–99)
Potassium: 4.2 mmol/L (ref 3.5–5.1)
Sodium: 142 mmol/L (ref 135–145)

## 2019-12-09 LAB — URINALYSIS, COMPLETE (UACMP) WITH MICROSCOPIC
Bilirubin Urine: NEGATIVE
Glucose, UA: NEGATIVE mg/dL
Hgb urine dipstick: NEGATIVE
Ketones, ur: 5 mg/dL — AB
Nitrite: NEGATIVE
Protein, ur: NEGATIVE mg/dL
Specific Gravity, Urine: 1.005 (ref 1.005–1.030)
pH: 9 — ABNORMAL HIGH (ref 5.0–8.0)

## 2019-12-09 LAB — HEPATIC FUNCTION PANEL
ALT: 16 U/L (ref 0–44)
AST: 20 U/L (ref 15–41)
Albumin: 3.9 g/dL (ref 3.5–5.0)
Alkaline Phosphatase: 66 U/L (ref 38–126)
Bilirubin, Direct: 0.2 mg/dL (ref 0.0–0.2)
Indirect Bilirubin: 1.2 mg/dL — ABNORMAL HIGH (ref 0.3–0.9)
Total Bilirubin: 1.4 mg/dL — ABNORMAL HIGH (ref 0.3–1.2)
Total Protein: 6.8 g/dL (ref 6.5–8.1)

## 2019-12-09 LAB — CK: Total CK: 49 U/L (ref 38–234)

## 2019-12-09 LAB — TROPONIN I (HIGH SENSITIVITY)
Troponin I (High Sensitivity): 5 ng/L (ref <18)
Troponin I (High Sensitivity): 7 ng/L (ref <18)

## 2019-12-09 LAB — LIPASE, BLOOD: Lipase: 27 U/L (ref 11–51)

## 2019-12-09 MED ORDER — SODIUM CHLORIDE 0.9 % IV BOLUS
500.0000 mL | Freq: Once | INTRAVENOUS | Status: AC
  Administered 2019-12-09: 500 mL via INTRAVENOUS

## 2019-12-09 MED ORDER — IOHEXOL 300 MG/ML  SOLN
100.0000 mL | Freq: Once | INTRAMUSCULAR | Status: AC | PRN
  Administered 2019-12-09: 100 mL via INTRAVENOUS

## 2019-12-09 NOTE — Discharge Instructions (Addendum)
Thank you for letting us take care of you in the emergency department today.  ° °Please continue to take any regular, prescribed medications.  ° °Please follow up with: °- Your primary care doctor to review your ER visit and follow up on your symptoms.  ° °Please return to the ER for any new or worsening symptoms.  ° °

## 2019-12-09 NOTE — ED Notes (Signed)
Returned from CT, [place] placed back to suction. IVF infusing, Son stepped out for a brief time.

## 2019-12-09 NOTE — Telephone Encounter (Signed)
Spoke with [name](DPR) and informed that no Biotronik alert received today. Patient was very anxious this morning and had ED visit . No c/o CP, SOB or dizziness. She had no syncopal episode. Will contact family if Biotronik alert received for any issues on 12/10/19.

## 2019-12-09 NOTE — ED Notes (Signed)
Family at bedside. 

## 2019-12-09 NOTE — ED Triage Notes (Addendum)
Pt arrived via [facility] from home with reports of weakness, pt has hx of dementia, normally ambulatory without much assistance, however, this morning per EMS family reported pt required assistance getting up and has been more weak than usual. Per EMS family reports pt gets this way whenever she is in A-fib.  Per EMS, pt also c/o chest pain prior to their arrival on scene, but on arrival the chest pain had resolved.   Pt has pacemaker.   Pt is alert and oriented at this time to self, place, time, unsure of situation.

## 2019-12-09 NOTE — Telephone Encounter (Signed)
Patient relative [name] calling to make office/Dr Caryl Comes aware  States that patient's pacemaker is not working and has been taken to the ED  Patient's son had been with her but had to leave, [name] is upset because they will not allow her to be with patient even though she has dementia

## 2019-12-09 NOTE — Telephone Encounter (Signed)
LMOM to call office. Will have remote transmission sent.

## 2019-12-09 NOTE — ED Notes (Signed)
This RN went in to DC pt. Daughter in law at bedside. Asked if she could take pt home. She stated she could but she wanted to talk to the doctor. Informed EDP that family wanted to talk to her.

## 2019-12-09 NOTE — ED Provider Notes (Signed)
[facility] Emergency Department Provider Note  ____________________________________________   First MD Initiated Contact with Patient 12/09/19 0901     (approximate)  I have reviewed the triage vital signs and the nursing notes.  History  Chief Complaint Weakness    HPI Leah Hayden is a 86 y.o. female past medical history as below, including atrial fibrillation, tachybradycardia syndrome, pacemaker in place who presents emergency department for weakness and abdominal pain.  History fairly limited due to patient's dementia.  Son at bedside states that the patient started complaining of pain last night and this morning, which is atypical of her.  Son also states she seems more weak than normal.  Prior to this she was at her baseline without any acute complaints.  Patient is able to voice that she is got abdominal and back pain, but has difficulty describing this further in the setting of her dementia.  No vomiting or diarrhea.  No fevers.  Level 5 caveat: History limited due to patient's dementia.   Past Medical Hx Past Medical History:  Diagnosis Date  . Allergic rhinitis   . Asthma   . Biceps tendon tear    a. right->s/p surgical repair winter of 2014.  . Diverticulosis of colon   . GERD (gastroesophageal reflux disease)   . Hammer toe of right foot   . Hx of colonic polyps   . HX: breast cancer   . Meniere's disease   . NSTEMI (non-ST elevated myocardial infarction) ([place])    a. 11/2012 in setting of rapid afib;  b. 11/2012 Cath: nl EF, nonobs CAD->Med Rx;  b. 11/2012 Echo: EF 60-65%, mild LVH, mild MR/TR.  . Osteoarthritis   . PAF (paroxysmal atrial fibrillation) ([place])    a. Eliquis and amio initiated 11/2012 in setting of admission with RVR and NSTEMI;  b. Subsequently changed to flecainide and xarelto;  c. 11/2013 Echo: EF 60-65%, mild LVH, mod MR/TR.  Marland Kitchen Sinus brady-tachy syndrome ([place])    a. Post-conversion pause of 7.2 seconds during 11/2012  hospitalization @ [facility]; b. 04/2017 Event Monitor: No significant arrhythmias.    Problem List Patient Active Problem List   Diagnosis Date Noted  . Corn of toe 10/28/2019  . Atherosclerosis of native coronary artery of native heart with angina pectoris ([place]) 09/03/2019  . Confusion 07/25/2019  . Hallucinations 07/25/2019  . Pedal edema 07/25/2019  . Abnormal urinalysis 07/25/2019  . Sinus node dysfunction ([place]) 09/20/2018  . Mood disorder ([place]) 06/26/2018  . Chronic renal disease, stage III 06/26/2018  . Arrhythmia 04/12/2018  . Vitamin B12 deficiency 11/10/2015  . Vascular dementia, uncomplicated ([place]) 123456  . Advance directive discussed with patient 06/07/2015  . Hyperlipidemia 05/13/2014  . PAF (paroxysmal atrial fibrillation) ([place])   . Routine general medical examination at a health care facility 05/03/2012  . SVT/ PSVT/ PAT 01/10/2010  . Sick sinus syndrome ([place]) 01/10/2010  . Generalized osteoarthrosis, involving multiple sites 12/18/2007  . ALLERGIC RHINITIS 03/07/2007  . Mild intermittent asthma in adult without complication 123456  . GERD 03/07/2007  . DIVERTICULOSIS, COLON 03/07/2007  . BREAST CANCER, HX OF 03/07/2007    Past Surgical Hx Past Surgical History:  Procedure Laterality Date  . ABDOMINAL HYSTERECTOMY    . CARDIAC CATHETERIZATION  11/2013   [facility]  . [place]   2nd to left toe  . ESOPHAGOGASTRODUODENOSCOPY  10/1999   inflammation only   . FOREIGN BODY REMOVAL Right 01/02/2013   Procedure: FOREIGN BODY REMOVAL RIGHT  INDEX FINGER;  Surgeon: Cammie Sickle., MD;  Location: [place];  Service: Orthopedics;  Laterality: Right;  . HAMMER TOE SURGERY  11/15   Dr Milinda Pointer  . MASTECTOMY  1984   left   . MASTECTOMY  1985   right  . PACEMAKER IMPLANT N/A 09/20/2018   Procedure: PACEMAKER IMPLANT;  Surgeon: Deboraha Sprang, MD;  Location: [place] [facility];  Service: Cardiovascular;  Laterality: N/A;  . PARTIAL  HYSTERECTOMY  1966   w/ bladder tack  . ROTATOR CUFF REPAIR  10/09   right   . TOE SURGERY  2001/2002   2nd/3rd,left     Medications Prior to Admission medications   Medication Sig Start Date End Date Taking? Authorizing Provider  albuterol (PROVENTIL HFA) 108 (90 BASE) MCG/ACT inhaler Inhale 2 puffs into the lungs every 6 (six) hours as needed for wheezing or shortness of breath.     [provider]  cyanocobalamin 500 MCG tablet Take 500 mcg by mouth every other day.     [provider]  diltiazem (CARDIZEM) 30 MG tablet TAKE 1 TABLET (30 MG TOTAL) BY MOUTH EVERY 8 (EIGHT) HOURS 07/31/19   Gollan, Kathlene November, MD  donepezil (ARICEPT) 5 MG tablet TAKE 1 TABLET (5 MG TOTAL) BY MOUTH AT BEDTIME. 02/06/19   Venia Carbon, MD  Fluticasone-Salmeterol (ADVAIR DISKUS) 100-50 MCG/DOSE AEPB Inhale 1 puff into the lungs daily as needed (shortness of breath or wheezing).     [provider]  hydroxypropyl methylcellulose / hypromellose (ISOPTO TEARS / GONIOVISC) 2.5 % ophthalmic solution Place 1 drop into both eyes 3 (three) times daily as needed for dry eyes.    [provider]  loperamide (IMODIUM) 2 MG capsule Take by mouth as needed for diarrhea or loose stools.    [provider]  losartan (COZAAR) 25 MG tablet Take 1 tablet (25 mg total) by mouth daily. 09/30/19 12/29/19  Deboraha Sprang, MD  montelukast (SINGULAIR) 10 MG tablet Take 10 mg by mouth every evening.     [provider]  Multiple Vitamins-Minerals (CENTRUM SILVER 50+WOMEN) TABS Take 1 tablet by mouth daily.    [provider]  nitroGLYCERIN (NITROSTAT) 0.4 MG SL tablet Place 1 tablet (0.4 mg total) under the tongue every 5 (five) minutes as needed for chest pain. 02/22/18   Minna Merritts, MD  pantoprazole (PROTONIX) 40 MG tablet Take 1 tablet (40 mg total) by mouth every other day. 09/05/19   Tonia Ghent, MD  Rivaroxaban (XARELTO) 15 MG TABS tablet Take 1 tablet (15  mg total) by mouth daily with supper. 07/31/19   Minna Merritts, MD  VITAMIN D PO Take 5,000 Units by mouth daily.     [provider]    Allergies Amiodarone, Eliquis [apixaban], and Other  Family Hx Family History  Problem Relation Age of Onset  . Heart failure Mother   . Gout Mother   . Heart disease Mother        heart failure  . Arthritis Mother   . Heart failure Father   . Heart disease Father        heart failure  . Diabetes Maternal Grandmother   . Colon cancer Maternal Grandfather   . Cancer Maternal Grandfather        colon  . Arthritis Brother     Social Hx Social History   Tobacco Use  . Smoking status: Never Smoker  . Smokeless tobacco: Never Used  Substance Use Topics  . Alcohol use: No    Alcohol/week: 0.0 standard drinks  . Drug use: No     Review of Systems Unable to reliably obtain due to patient's history of dementia.  However, she is able to voice that she is experiencing abdominal pain and back pain.   Physical Exam  Vital Signs: ED Triage Vitals  Enc Vitals Group     BP --      Pulse Rate 12/09/19 0900 78     Resp 12/09/19 0900 (!) 22     Temp --      Temp src --      SpO2 12/09/19 0855 96 %     Weight 12/09/19 0859 155 lb (70.3 kg)     Height 12/09/19 0859 5' (1.524 m)     Head Circumference --      Peak Flow --      Pain Score 12/09/19 0858 0     Pain Loc --      Pain Edu? --      Excl. in [place]? --     Constitutional: Alert and oriented to place, self, DOB.  Not oriented to situation.  Demented.  Son at bedside. Head: Normocephalic. Atraumatic. Eyes: Conjunctivae clear. Sclera anicteric. Pupils equal and symmetric. Nose: No masses or lesions. No congestion or rhinorrhea. Mouth/Throat: Wearing mask.  Neck: No stridor. Trachea midline.  Cardiovascular: Normal rate, regular rhythm. Extremities well perfused. Respiratory: Normal respiratory effort.  Lungs CTAB. Gastrointestinal: Soft. Non-distended.  Mild,  generalized tenderness.  No rebound, guarding, rigidity. Genitourinary: Deferred. Musculoskeletal: No lower extremity edema. No deformities. Neurologic:  Normal speech and language. No gross focal or lateralizing neurologic deficits are appreciated.  Consistent with history of dementia. Skin: Skin is warm, dry and intact. No rash noted. Psychiatric: Mood and affect are appropriate for situation.  EKG  Personally reviewed and interpreted by myself.   Date: 12/09/2019 Time: 0859 Rate: 78 Rhythm: Sinus Axis: normal Intervals: QTc 504 ms, wide QRS due to bundle branch block RBBB TWI inferior, V3, V4 No STEMI    Radiology  Personally reviewed available imaging myself.   CXR - IMPRESSION:  1. Stable cardiomegaly without failure.  2. Stable small left pleural effusion.  3. No airspace disease.   CT A/P - IMPRESSION:  2 cm low-density lesion within the pancreatic body. This is  nonspecific and could reflect benign pseudo cyst or cystic neoplasm.  When the patient is clinically stable and able to follow directions  and hold their breath (preferably as an outpatient) further  evaluation with dedicated abdominal MRI should be considered. Please  note that today's CT is limited due to respiratory motion which  would make and MRI currently nondiagnostic. Also, comorbidities and  desire for further treatment should be considered.   Aortic atherosclerosis.   Colonic diverticulosis. No active diverticulitis.   Urinary bladder distention with mild fullness of the left renal  collecting system without overt hydronephrosis.    Procedures  Procedure(s) performed (including critical care):  .1-3 Lead EKG Interpretation Performed by: Lilia Pro., MD Authorized by: Lilia Pro., MD     Interpretation: normal     ECG rate assessment: normal     Rhythm: sinus rhythm   Comments:     Indication: Generalized weakness Impression: Sinus rhythm, normal rate,  RBBB     Initial Impression / Assessment and Plan / MDM / ED Course  86 y.o. female who presents to the ED for generalized weakness, complaining  of abdominal pain and back pain.  History of dementia.  Ddx: UTI, intra-abdominal pathology, atypical ACS, electrolyte abnormality  Will plan for labs, cardiac monitoring, imaging  Troponin x 2 negative.  Labs without actionable derangements.  Urine negative for infection.  CT scan as above.  Patient able to ambulate to the restroom with assistance with a walker and was able to urinate, post void bladder scan with approximately 200 cc in her bladder.  As such, do not feel Foley is necessary, especially in the setting of her dementia, feel this would be more harmful than helpful.  Family is in agreement with this.  Perhaps an element of ladder distention was originally contributing to her symptoms, now relieved after urinating.  She is no longer complaining of any pain.  Updated family, daughter-in-law now at bedside, on results (including the incidentally found lesion in the pancreatic body).  Given negative work-up, feel patient is stable for discharge with outpatient follow-up.  Given return precautions.   _______________________________   As part of my medical decision making I have reviewed available labs, radiology tests, reviewed old records/performed chart review, obtained additional history from family (son, daughter-in-law).    Final Clinical Impression(s) / ED Diagnosis  Final diagnoses:  Generalized weakness  Generalized abdominal pain       Note:  This document was prepared using Dragon voice recognition software and may include unintentional dictation errors.   Lilia Pro., MD 12/09/19 1258

## 2019-12-09 NOTE — ED Notes (Signed)
Pt transported to CT ?

## 2019-12-09 NOTE — ED Notes (Signed)
Informed Dr. Joan Mayans re: amount on bladder scan.  Prior to bladder scan, pt's brief was changed and brief soaked with urine. Pt already has about 300cc clear yellow urine in canister from purewick since applied.

## 2019-12-09 NOTE — ED Notes (Signed)
Pt assisted to bathroom with walker, pt required a lot of prompting due to history of dementia.  Pt started urinating before sitting down and then missed the hat.  Pt also had BM. Pt assisted back to bed and bladder scan post void obtained.  272ml in bladder post void. Dr. Joan Mayans notified. No additional orders at this time.

## 2019-12-10 ENCOUNTER — Telehealth: Payer: Self-pay

## 2019-12-10 NOTE — Telephone Encounter (Signed)
Spoke to Leah Hayden, informed no alerts noted. Advised to call DC if any questions or concerns arise.

## 2019-12-10 NOTE — Telephone Encounter (Signed)
Thanks SK   

## 2019-12-10 NOTE — Telephone Encounter (Signed)
No alerts received from patient's PPM as of latest message from 12/10/19. No recent AT/AF detections.

## 2019-12-10 NOTE — Telephone Encounter (Signed)
To SK as an FYI.

## 2019-12-16 ENCOUNTER — Telehealth: Payer: Self-pay

## 2019-12-16 NOTE — Telephone Encounter (Signed)
I already had checked the ED note. If she feels she is doing okay, we can hold off on ER follow up visit

## 2019-12-16 NOTE — Telephone Encounter (Signed)
Spoke to [place].

## 2019-12-16 NOTE — Telephone Encounter (Signed)
Leah Hayden (DPR signed) left v/m that pt was seen in [facility] ED on 12/09/19 and Leah Hayden wants to know if pt needs to be seen by Dr [name]. Pt went to ED with more weakness than usual; abd & back pain; no fever. (pt has dementia). I spoke with Neil Crouch said that pt told the ED provider that she was having abd and  Back pain but Leah Hayden said pt has not complained at home about abd or back pain. Leah Hayden does not think that pt having any new issues with weakness; pt walking around [place]; Leah Hayden said this week memory is worse and pt is more confused this week. Leah Hayden said pt is not complaining of burning or pain or frequency of urine. Did [place] of bladder and U/A at hospital. Leah Hayden does not think pt has UTI. Leah Hayden request Dr [name] to review ED note and advise if pt needs ED FU. Leah Hayden does not want to bring pt in unless it is necessary.Please advise.

## 2019-12-21 ENCOUNTER — Other Ambulatory Visit: Payer: Self-pay | Admitting: Cardiovascular Disease

## 2019-12-22 ENCOUNTER — Telehealth: Payer: Self-pay | Admitting: Cardiovascular Disease

## 2019-12-22 NOTE — Telephone Encounter (Signed)
Spoke with patients daughter per release form. She states that last time patient had 2 appointments the same day with Rockey Situ and Caryl Comes which was hard for them. Reviewed that she was seen in February by Dr. Caryl Comes and is due to see Dr. Rockey Situ. Reviewed schedule and was able to schedule her in June with daughters assistance. She will accompany patient as well as caregiver who assists with transferring patient. Advised to arrive early due to entrance at the [facility]. She verbalized understanding of our conversation, confirmed appointment, and had no further questions at this time.

## 2019-12-22 NOTE — Telephone Encounter (Signed)
Patient was told she didn't need to see [facility] and could just see Dr. Caryl Comes .  Is this ok ?  Please advise

## 2019-12-30 ENCOUNTER — Ambulatory Visit (INDEPENDENT_AMBULATORY_CARE_PROVIDER_SITE_OTHER): Payer: PPO | Admitting: *Deleted

## 2019-12-30 DIAGNOSIS — I495 Sick sinus syndrome: Secondary | ICD-10-CM

## 2019-12-30 DIAGNOSIS — I480 Paroxysmal atrial fibrillation: Secondary | ICD-10-CM

## 2019-12-30 LAB — CUP PACEART REMOTE DEVICE CHECK
Date Time Interrogation Session: 20210518131858
Implantable Lead Implant Date: 20200207
Implantable Lead Implant Date: 20200207
Implantable Lead Location: 753859
Implantable Lead Location: 753860
Implantable Lead Model: 5076
Implantable Pulse Generator Implant Date: 20200207
Pulse Gen Model: 407145
Pulse Gen Serial Number: 69503042

## 2019-12-31 NOTE — Progress Notes (Signed)
Remote pacemaker transmission.   

## 2020-01-13 DIAGNOSIS — J300 Vasomotor rhinitis: Secondary | ICD-10-CM | POA: Diagnosis not present

## 2020-01-13 DIAGNOSIS — R21 Rash and other nonspecific skin eruption: Secondary | ICD-10-CM | POA: Diagnosis not present

## 2020-01-13 DIAGNOSIS — J4530 Mild persistent asthma, uncomplicated: Secondary | ICD-10-CM | POA: Diagnosis not present

## 2020-01-13 DIAGNOSIS — H1045 Other chronic allergic conjunctivitis: Secondary | ICD-10-CM | POA: Diagnosis not present

## 2020-01-24 ENCOUNTER — Other Ambulatory Visit: Payer: Self-pay | Admitting: Physician Assistant

## 2020-01-26 ENCOUNTER — Telehealth: Payer: Self-pay

## 2020-01-26 ENCOUNTER — Ambulatory Visit: Payer: PPO | Admitting: Cardiovascular Disease

## 2020-01-26 MED ORDER — PANTOPRAZOLE SODIUM 40 MG PO TBEC: 40.0000 mg | DELAYED_RELEASE_TABLET | ORAL | 3 refills | Status: DC

## 2020-01-26 NOTE — Telephone Encounter (Signed)
Rx sent electronically.  

## 2020-01-26 NOTE — Telephone Encounter (Signed)
Okay to refill for a year 

## 2020-01-26 NOTE — Telephone Encounter (Signed)
Leah Hayden (DPR signed)  that pt was put on pantoprazole couple of yrs ago when in hospital and if Dr [name] wants pt to continue taking pantoprazole 40 mg one every other day please send to [place]. Leah Hayden said the other Dr that prescribed med had refilled up until this time.Please advise.

## 2020-02-04 ENCOUNTER — Ambulatory Visit: Payer: PPO | Admitting: Physician Assistant

## 2020-02-04 ENCOUNTER — Other Ambulatory Visit: Payer: Self-pay

## 2020-02-04 ENCOUNTER — Encounter: Payer: Self-pay | Admitting: Physician Assistant

## 2020-02-04 VITALS — BP 120/60 | HR 61 | Ht 68.0 in | Wt 158.5 lb

## 2020-02-04 DIAGNOSIS — Z8679 Personal history of other diseases of the circulatory system: Secondary | ICD-10-CM | POA: Diagnosis not present

## 2020-02-04 DIAGNOSIS — E785 Hyperlipidemia, unspecified: Secondary | ICD-10-CM

## 2020-02-04 DIAGNOSIS — Z9181 History of falling: Secondary | ICD-10-CM | POA: Diagnosis not present

## 2020-02-04 DIAGNOSIS — I340 Nonrheumatic mitral (valve) insufficiency: Secondary | ICD-10-CM

## 2020-02-04 DIAGNOSIS — I252 Old myocardial infarction: Secondary | ICD-10-CM | POA: Diagnosis not present

## 2020-02-04 DIAGNOSIS — I4729 Other ventricular tachycardia: Secondary | ICD-10-CM

## 2020-02-04 DIAGNOSIS — I10 Essential (primary) hypertension: Secondary | ICD-10-CM | POA: Diagnosis not present

## 2020-02-04 DIAGNOSIS — I495 Sick sinus syndrome: Secondary | ICD-10-CM

## 2020-02-04 DIAGNOSIS — I480 Paroxysmal atrial fibrillation: Secondary | ICD-10-CM

## 2020-02-04 DIAGNOSIS — I472 Ventricular tachycardia: Secondary | ICD-10-CM | POA: Diagnosis not present

## 2020-02-04 DIAGNOSIS — Z7901 Long term (current) use of anticoagulants: Secondary | ICD-10-CM

## 2020-02-04 DIAGNOSIS — I2510 Atherosclerotic heart disease of native coronary artery without angina pectoris: Secondary | ICD-10-CM

## 2020-02-04 DIAGNOSIS — Z950 Presence of cardiac pacemaker: Secondary | ICD-10-CM

## 2020-02-04 DIAGNOSIS — I071 Rheumatic tricuspid insufficiency: Secondary | ICD-10-CM

## 2020-02-04 DIAGNOSIS — R410 Disorientation, unspecified: Secondary | ICD-10-CM

## 2020-02-04 MED ORDER — DILTIAZEM HCL 30 MG PO TABS: ORAL_TABLET | ORAL | 0 refills | Status: DC

## 2020-02-04 MED ORDER — DILTIAZEM HCL 30 MG PO TABS: ORAL_TABLET | ORAL | 6 refills | Status: AC

## 2020-02-04 NOTE — Progress Notes (Signed)
Office Visit    Patient Name: Leah Hayden Date of Encounter: 02/04/2020  Primary Care Provider:  Venia Carbon, MD Primary Cardiologist:  Ida Rogue, MD  Chief Complaint    Chief Complaint  Patient presents with  . office visit    3 month F/U-Daughter reports episodes where patient does not feel well and BP is elevated with visual disturbances. She also notes patient seems to be more confused lately than she normally has been; Meds verbally reviewed with daughter.    86 y.o. female with a hx of NSTEMI 11/2013, nonobstructive CAD by [facility] 11/2013, PAF on Xarelto complicated by posttermination pauses, SVT, tachybradycardia syndrome, dementia, breast cancer, Mnire's disease, asthma, diverticulosis, and GERD who is being seen today for follow-up.  Past Medical History    Past Medical History:  Diagnosis Date  . Allergic rhinitis   . Asthma   . Biceps tendon tear    a. right->s/p surgical repair winter of 2014.  . Diverticulosis of colon   . GERD (gastroesophageal reflux disease)   . Hammer toe of right foot   . Hx of colonic polyps   . HX: breast cancer   . Meniere's disease   . NSTEMI (non-ST elevated myocardial infarction) ([place])    a. 11/2012 in setting of rapid afib;  b. 11/2012 Cath: nl EF, nonobs CAD->Med Rx;  b. 11/2012 Echo: EF 60-65%, mild LVH, mild MR/TR.  . Osteoarthritis   . PAF (paroxysmal atrial fibrillation) ([place])    a. Eliquis and amio initiated 11/2012 in setting of admission with RVR and NSTEMI;  b. Subsequently changed to flecainide and xarelto;  c. 11/2013 Echo: EF 60-65%, mild LVH, mod MR/TR.  Marland Kitchen Sinus brady-tachy syndrome ([place])    a. Post-conversion pause of 7.2 seconds during 11/2012 hospitalization @ [facility]; b. 04/2017 Event Monitor: No significant arrhythmias.   Past Surgical History:  Procedure Laterality Date  . ABDOMINAL HYSTERECTOMY    . CARDIAC CATHETERIZATION  11/2013   [facility]  . [place]   2nd to left toe  .  ESOPHAGOGASTRODUODENOSCOPY  10/1999   inflammation only   . FOREIGN BODY REMOVAL Right 01/02/2013   Procedure: FOREIGN BODY REMOVAL RIGHT INDEX FINGER;  Surgeon: Cammie Sickle., MD;  Location: [facility];  Service: Orthopedics;  Laterality: Right;  . HAMMER TOE SURGERY  11/15   Dr Milinda Pointer  . MASTECTOMY  1984   left   . MASTECTOMY  1985   right  . PACEMAKER IMPLANT N/A 09/20/2018   Procedure: PACEMAKER IMPLANT;  Surgeon: Deboraha Sprang, MD;  Location: [facility] [facility];  Service: Cardiovascular;  Laterality: N/A;  . PARTIAL HYSTERECTOMY  1966   w/ bladder tack  . ROTATOR CUFF REPAIR  10/09   right   . TOE SURGERY  2001/2002   2nd/3rd,left     Allergies  Allergies  Allergen Reactions  . Amiodarone Other (See Comments)    Chest discomfort  . Eliquis [Apixaban] Rash  . Other Rash    oramycin    History of Present Illness    Leah Hayden is a 86 y.o. female with PMH as above.  She has a history of tachybradycardia syndrome s/p PPM, severe orthostatic hypotension, dyspnea on exertion, GERD, non-ST elevation myocardial 2014 in the setting of atrial fibrillation with RVR and posttermination pauses on Eliquis.   She was intially diagnosed with atrial fibrillation ~ 2012 and managed with amiodarone and Eliquis (stopped due to rash).  In 11/2013, she was admitted for NSTEMI and noted to have 7.2-second post-termination pauses.  She was changed to flecainide and Xarelto. In 2018, event monitoring did not show significant arrhythmias.  In 03/2018, she was admitted for diarrhea and bradycardia, developing A. fib with RVR during admission, and later converted back to SB after a 5.9-second pause.  EF 60 -65%.  It was recommended she hold her flecainide and pursue outpatient cardiac monitoring, which was not completed.  In hospital follow-up 04/2018, she started diltiazem 30 mg as needed for rates over 90 bpm.    She was admitted 12/12 -07/27/2018 for palpitations and weakness  with rates in the 50-90s bpm at home. In the ED, she was in NSR with HR 60bpm. Telemetry showed 2.55 second pauses, and it was recommended she take her diltiazem 30mg  prn for HR >70bpm, as her heart rate was typically in the 50s bpm.   At hospital follow-up, her Xarelto was decreased to 15mg  daily due to decreased renal function. It was noted that the patient and her daughter in law were finding it very difficult to manage her Afib with rate control in the setting of her tachybradycardia syndrome and post-termination pauses and wished to see EP.   She was seen by Dr. Caryl Comes of EP 09/19/2018 with notes indicating that pacing was indicated and pacemaker implant 09/20/2018 due to prolonged pauses with risk for syncope, as well as unknown burden of Afib. She underwent successful St. Jude dual chamber pacemaker insertion by Dr. Lovena Le on 2/7 and tolerated the procedure well without post-procedure complication. Losartan decreased.   On 3/27 and 11/19/18 the patient's daughter in law called into the office noting the patient was not feeling well and her concern that the patient was in Afib, weakness, and confusion. EMS vitals significant for BP 107/67 (NS 500cc given) and 120-160 BPM. EKG showed pacing with underlying Afib and HR 147, new RBBB compared to 09/2018, and TWI. CXR negative for acute changes. She received 1 dose of diltiazem with a drop in her pressures and HR.    Today, she returns to clinic and notes ongoing weakness and confusion.  Per daughter, she is uncertain if this confusion is part of the normal aging process versus something that could be fixed in clinic.  BP today 120/60 with HR 61 bpm.  92% on room air, which was noted to be possibly contributing.  Also discussed was influence of sugars and electrolytes with labs taken today to help assist with data. Per daughter, the patient is notably confused in the early morning, at which time she is noted to have elevated pressure and heart rate. She notes  improvement with food and drink.  Also, of note, the patient has had frequent diarrhea and is on Imodium 2 mg as needed for loose stools.  Her most recent episode of diarrhea was early this morning, which is concerning.  She continues to take losartan 25 mg for BP.  She reports compliance with anticoagulation.  No s/sx of bleeding Of note, this anticoagulation was not listed in our medical list with our records updated to include Xarelto for anticoagulation.   She reports medication compliance.  Home Medications    Prior to Admission medications   Medication Sig Start Date End Date Taking? Authorizing Provider  albuterol (PROVENTIL HFA) 108 (90 BASE) MCG/ACT inhaler Inhale 2 puffs into the lungs every 6 (six) hours as needed for wheezing or shortness of breath.    Yes [provider]  CRANBERRY PO  Take 2 tablets by mouth daily.   Yes [provider]  cyanocobalamin 500 MCG tablet Take 500 mcg by mouth every other day.    Yes [provider]  diltiazem (CARDIZEM) 30 MG tablet Take 2 tablets (60mg ) by mouth every morning, and 1 tablet (30mg ) by mouth in the afternoon and in the evening. 02/04/20  Yes Ayden Apodaca D, PA-C  donepezil (ARICEPT) 5 MG tablet TAKE 1 TABLET (5 MG TOTAL) BY MOUTH AT BEDTIME. 02/06/19  Yes Venia Carbon, MD  Fluticasone-Salmeterol (ADVAIR DISKUS) 100-50 MCG/DOSE AEPB Inhale 1 puff into the lungs daily as needed (shortness of breath or wheezing).    Yes [provider]  hydroxypropyl methylcellulose / hypromellose (ISOPTO TEARS / GONIOVISC) 2.5 % ophthalmic solution Place 1 drop into both eyes 3 (three) times daily as needed for dry eyes.   Yes [provider]  loperamide (IMODIUM) 2 MG capsule Take by mouth as needed for diarrhea or loose stools.   Yes [provider]  losartan (COZAAR) 25 MG tablet Take 1 tablet (25 mg total) by mouth daily. 09/30/19 02/03/29 Yes Deboraha Sprang, MD  montelukast (SINGULAIR) 10 MG tablet  Take 10 mg by mouth every evening.    Yes [provider]  Multiple Vitamins-Minerals (CENTRUM SILVER 50+WOMEN) TABS Take 1 tablet by mouth daily.   Yes [provider]  nitroGLYCERIN (NITROSTAT) 0.4 MG SL tablet Place 1 tablet (0.4 mg total) under the tongue every 5 (five) minutes as needed for chest pain. 02/22/18  Yes Gollan, Kathlene November, MD  pantoprazole (PROTONIX) 40 MG tablet Take 1 tablet (40 mg total) by mouth every other day. 01/26/20  Yes Viviana Simpler I, MD  VITAMIN D PO Take 5,000 Units by mouth daily.    Yes [provider]    Review of Systems    She denies chest pain, palpitations, dyspnea, pnd, orthopnea, n, v, dizziness, syncope, edema, weight gain, or early satiety. Per daughter, she has confusion in the early AM and frequent diarrhea.   All other systems reviewed and are otherwise negative except as noted above.  Physical Exam    VS:  BP 120/60 (BP Location: Right Arm, Patient Position: Sitting, Cuff Size: Normal)   Pulse 61   Ht 5\' 8"  (1.727 m)   Wt 158 lb 8 oz (71.9 kg)   SpO2 92%   BMI 24.10 kg/m  , BMI Body mass index is 24.1 kg/m. GEN: Elderly female, wheelchair, in no acute distress.  HEENT: normal. Neck: Supple, no JVD, carotid bruits, or masses. Cardiac: RRR, no murmurs, rubs, or gallops. No clubbing, cyanosis, mild dependent edema.  Radials/DP/PT 2+ and equal bilaterally.  Respiratory:  Respirations regular and unlabored, clear to auscultation bilaterally. GI: Soft, nontender, nondistended, BS + x 4. MS: no deformity or atrophy. Skin: warm and dry, no rash. Neuro:  Strength and sensation are intact. Psych: Normal affect.  Accessory Clinical Findings    ECG personally reviewed by me today - atrial paced rhythm, underlying RBBB, 61 bpm, PR interval 180 ms, QRS 90 ms, T wave inversion in leads III, aVF- no acute changes.  VITALS Reviewed today   Temp Readings from Last 3 Encounters:  12/09/19 97.9 F (36.6 C) (Oral)  11/11/19  (!) 97.4 F (36.3 C)  10/28/19 98 F (36.7 C)   BP Readings from Last 3 Encounters:  02/04/20 120/60  12/09/19 (!) 147/78  11/11/19 140/78   Pulse Readings from Last 3 Encounters:  02/04/20 61  12/09/19 66  11/11/19 60    Wt Readings from Last 3 Encounters:  02/04/20 158 lb 8 oz (71.9 kg)  12/09/19 155 lb (70.3 kg)  11/11/19 156 lb (70.8 kg)     LABS  reviewed today   Lab Results  Component Value Date   WBC 4.7 12/09/2019   HGB 13.4 12/09/2019   HCT 39.7 12/09/2019   MCV 92.3 12/09/2019   PLT 194 12/09/2019   Lab Results  Component Value Date   CREATININE 1.05 (H) 12/09/2019   BUN 26 (H) 12/09/2019   NA 142 12/09/2019   K 4.2 12/09/2019   CL 107 12/09/2019   CO2 27 12/09/2019   Lab Results  Component Value Date   ALT 16 12/09/2019   AST 20 12/09/2019   ALKPHOS 66 12/09/2019   BILITOT 1.4 (H) 12/09/2019   Lab Results  Component Value Date   CHOL 154 07/25/2018   HDL 54 07/25/2018   LDLCALC 92 07/25/2018   TRIG 41 07/25/2018   CHOLHDL 2.9 07/25/2018    Lab Results  Component Value Date   HGBA1C 5.9 11/28/2013   Lab Results  Component Value Date   TSH 1.40 07/25/2019     STUDIES/PROCEDURES reviewed today   Echo 03/2018 - Left ventricle: The cavity size was normal. There was mild  concentric hypertrophy. Systolic function was normal. The  estimated ejection fraction was in the range of 60% to 65%. Wall  motion was normal; there were no regional wall motion  abnormalities. Doppler parameters are consistent with abnormal  left ventricular relaxation (grade 1 diastolic dysfunction).  - Left atrium: The atrium was mildly dilated.  - Right ventricle: Systolic function was normal.  - Pulmonary arteries: Systolic pressure was within the normal  range.  - Pericardium, extracardiac: A trivial pericardial effusion was  identified.   Assessment & Plan    Medication management --[place] not listed on today's medication list.  Patient  reports she has been on anticoagulation.  On review of EMR, [place] is listed on EP medication list.  Unclear as to why it is not listed on general cardiology medication list.  Will update list.  CAD --No recent symptoms consistent with angina.  Continue current medications.  No indication for further ischemic work-up.  PAF/SVT/PSVT -History of sick sinus syndrome s/p PPM.  Relatively asx. Noted confusion but unclear if part of normal aging process with patient not very verbal on exam. On review of ROS today, will increase diltiazem to additional 30 mg in the morning.  Otherwise, continue 30mg  q8h (60mg  for first dose). Recommend closely monitor BP and heart rate during this time.  In addition, we will recheck labs to verify TSH, A1c, BMET, and CBC are WNL.  Continue to monitor for presyncope and syncope.  Memory loss --Per PCP. Ongoing.   HTN --BP reportedly high early in AM. Recommend extra dose of diltiazem 30 mg in the morning.  Recommend closely monitor BP and heart rate, as well as patient symptoms.  Requested pt / family  report back within 1 week with progress.   Arvil Chaco, PA-C 02/04/2020

## 2020-02-04 NOTE — Patient Instructions (Signed)
Medication Instructions:   Your physician has recommended you make the following change in your medication:   1.  Increase your Diltiazem (Cardizem) dose to: Take 2 tablets (60mg ) by mouth every morning, and 1 tablet (30mg ) by mouth in the afternoon and in the evening.  *Monitor your Blood Pressure 2 hours after your morning dose and also every evening.  **Keep track of your diarrhea episodes.  **Call in at the end of the week with your Blood Pressure results for the week.   *If you need a refill on your cardiac medications before your next appointment, please call your pharmacy*   Lab Work: None Ordered. If you have labs (blood work) drawn today and your tests are completely normal, you will receive your results only by: Marland Kitchen MyChart Message (if you have MyChart) OR . A paper copy in the mail If you have any lab test that is abnormal or we need to change your treatment, we will call you to review the results.   Testing/Procedures: None Ordered.   Follow-Up: At [facility], you and your health needs are our priority.  As part of our continuing mission to provide you with exceptional heart care, we have created designated Provider Care Teams.  These Care Teams include your primary Cardiologist (physician) and Advanced Practice Providers (APPs -  Physician Assistants and Nurse Practitioners) who all work together to provide you with the care you need, when you need it.  We recommend signing up for the patient portal called "MyChart".  Sign up information is provided on this After Visit Summary.  MyChart is used to connect with patients for Virtual Visits (Telemedicine).  Patients are able to view lab/test results, encounter notes, upcoming appointments, etc.  Non-urgent messages can be sent to your provider as well.   To learn more about what you can do with MyChart, go to [email or web address].    Your next appointment:   6 month(s) or sooner if needed.  The format for your next  appointment:   In Person  Provider:    You may see Ida Rogue, MD or one of the following Advanced Practice Providers on your designated Care Team:    Murray Hodgkins, NP  Christell Faith, PA-C  Marrianne Mood, PA-C  Laurann [place], NP    Other Instructions N/A

## 2020-02-05 LAB — BASIC METABOLIC PANEL
BUN/Creatinine Ratio: 20 (ref 12–28)
BUN: 25 mg/dL (ref 10–36)
CO2: 26 mmol/L (ref 20–29)
Calcium: 10 mg/dL (ref 8.7–10.3)
Chloride: 102 mmol/L (ref 96–106)
Creatinine, Ser: 1.24 mg/dL — ABNORMAL HIGH (ref 0.57–1.00)
GFR calc Af Amer: 44 mL/min/{1.73_m2} — ABNORMAL LOW (ref >59)
GFR calc non Af Amer: 38 mL/min/{1.73_m2} — ABNORMAL LOW (ref >59)
Glucose: 102 mg/dL — ABNORMAL HIGH (ref 65–99)
Potassium: 4.9 mmol/L (ref 3.5–5.2)
Sodium: 139 mmol/L (ref 134–144)

## 2020-02-05 LAB — HEMOGLOBIN A1C
Est. average glucose Bld gHb Est-mCnc: 120 mg/dL
Hgb A1c MFr Bld: 5.8 % — ABNORMAL HIGH (ref 4.8–5.6)

## 2020-02-05 LAB — CBC
Hematocrit: 37.1 % (ref 34.0–46.6)
Hemoglobin: 12.6 g/dL (ref 11.1–15.9)
MCH: 32.1 pg (ref 26.6–33.0)
MCHC: 34 g/dL (ref 31.5–35.7)
MCV: 94 fL (ref 79–97)
Platelets: 214 10*3/uL (ref 150–450)
RBC: 3.93 x10E6/uL (ref 3.77–5.28)
RDW: 12.6 % (ref 11.7–15.4)
WBC: 6.9 10*3/uL (ref 3.4–10.8)

## 2020-02-05 LAB — TSH: TSH: 1.65 u[IU]/mL (ref 0.450–4.500)

## 2020-02-07 ENCOUNTER — Other Ambulatory Visit: Payer: Self-pay | Admitting: Internal Medicine

## 2020-02-08 ENCOUNTER — Other Ambulatory Visit: Payer: Self-pay | Admitting: Cardiovascular Disease

## 2020-02-25 ENCOUNTER — Ambulatory Visit (INDEPENDENT_AMBULATORY_CARE_PROVIDER_SITE_OTHER): Payer: PPO | Admitting: Internal Medicine

## 2020-02-25 ENCOUNTER — Other Ambulatory Visit: Payer: Self-pay

## 2020-02-25 ENCOUNTER — Encounter: Payer: Self-pay | Admitting: Internal Medicine

## 2020-02-25 DIAGNOSIS — N3091 Cystitis, unspecified with hematuria: Secondary | ICD-10-CM | POA: Diagnosis not present

## 2020-02-25 DIAGNOSIS — F0150 Vascular dementia without behavioral disturbance: Secondary | ICD-10-CM | POA: Diagnosis not present

## 2020-02-25 DIAGNOSIS — I25119 Atherosclerotic heart disease of native coronary artery with unspecified angina pectoris: Secondary | ICD-10-CM | POA: Diagnosis not present

## 2020-02-25 DIAGNOSIS — N1832 Chronic kidney disease, stage 3b: Secondary | ICD-10-CM

## 2020-02-25 LAB — POC URINALSYSI DIPSTICK (AUTOMATED)
Bilirubin, UA: NEGATIVE
Blood, UA: NEGATIVE
Glucose, UA: NEGATIVE
Nitrite, UA: POSITIVE
Protein, UA: POSITIVE — AB
Spec Grav, UA: >=1.03 — AB (ref 1.010–1.025)
Urobilinogen, UA: 0.2 E.U./dL
pH, UA: 5.5 (ref 5.0–8.0)

## 2020-02-25 MED ORDER — CEPHALEXIN 500 MG PO CAPS
500.0000 mg | ORAL_CAPSULE | Freq: Three times a day (TID) | ORAL | 1 refills | Status: DC
Start: 2020-02-25 — End: 2020-07-01

## 2020-02-25 NOTE — Progress Notes (Signed)
Subjective:    Patient ID: Leah Hayden, female    DOB: 1928-01-20, 86 y.o.   MRN: [id number]  HPI Here with DIL [name] for follow up of dementia and other medical conditions This visit occurred during the SARS-CoV-2 public health emergency.  Safety protocols were in place, including screening questions prior to the visit, additional usage of staff PPE, and extensive cleaning of exam room while observing appropriate contact time as indicated for disinfecting solutions.   Doesn't feel well some days Memory is worsening Activated lifeline yesterday---doesn't remember why (but told DIL that she found blood on the pad)  Ongoing incontinence---night and sometimes day Frequent diarrhea---she cleans herself, but not always so great No dysuria  Still has aide 7 hours per day 1/2 day Saturday  Family otherwise and [employer] and meds are managed Stand by assist with showers and dressing  No chest pain No SOB Occasional dizziness ---no syncope  Current Outpatient Medications on File Prior to Visit  Medication Sig Dispense Refill  . albuterol (PROVENTIL HFA) 108 (90 BASE) MCG/ACT inhaler Inhale 2 puffs into the lungs every 6 (six) hours as needed for wheezing or shortness of breath.     . CRANBERRY PO Take 2 tablets by mouth daily.    . cyanocobalamin 500 MCG tablet Take 500 mcg by mouth every other day.     . diltiazem (CARDIZEM) 30 MG tablet Take 2 tablets (60mg ) by mouth every morning, and 1 tablet (30mg ) by mouth in the afternoon and in the evening. 120 tablet 6  . donepezil (ARICEPT) 5 MG tablet TAKE 1 TABLET (5 MG TOTAL) BY MOUTH AT BEDTIME. 90 tablet 3  . Fluticasone-Salmeterol (ADVAIR DISKUS) 100-50 MCG/DOSE AEPB Inhale 1 puff into the lungs daily as needed (shortness of breath or wheezing).     . hydroxypropyl methylcellulose / hypromellose (ISOPTO TEARS / GONIOVISC) 2.5 % ophthalmic solution Place 1 drop into both eyes 3 (three) times daily as needed for dry eyes.    Marland Kitchen loperamide  (IMODIUM) 2 MG capsule Take by mouth as needed for diarrhea or loose stools.    Marland Kitchen losartan (COZAAR) 25 MG tablet Take 1 tablet (25 mg total) by mouth daily. 90 tablet 2  . montelukast (SINGULAIR) 10 MG tablet Take 10 mg by mouth every evening.     . Multiple Vitamins-Minerals (CENTRUM SILVER 50+WOMEN) TABS Take 1 tablet by mouth daily.    . nitroGLYCERIN (NITROSTAT) 0.4 MG SL tablet Place 1 tablet (0.4 mg total) under the tongue every 5 (five) minutes as needed for chest pain. 25 tablet 1  . pantoprazole (PROTONIX) 40 MG tablet Take 1 tablet (40 mg total) by mouth every other day. 45 tablet 3  . VITAMIN D PO Take 5,000 Units by mouth daily.      No current facility-administered medications on file prior to visit.    Allergies  Allergen Reactions  . Amiodarone Other (See Comments)    Chest discomfort  . Eliquis [Apixaban] Rash  . Other Rash    oramycin    Past Medical History:  Diagnosis Date  . Allergic rhinitis   . Asthma   . Biceps tendon tear    a. right->s/p surgical repair winter of 2014.  . Diverticulosis of colon   . GERD (gastroesophageal reflux disease)   . Hammer toe of right foot   . Hx of colonic polyps   . HX: breast cancer   . Meniere's disease   . NSTEMI (non-ST elevated myocardial infarction) ([place])  a. 11/2012 in setting of rapid afib;  b. 11/2012 Cath: nl EF, nonobs CAD->Med Rx;  b. 11/2012 Echo: EF 60-65%, mild LVH, mild MR/TR.  . Osteoarthritis   . PAF (paroxysmal atrial fibrillation) ([place])    a. Eliquis and amio initiated 11/2012 in setting of admission with RVR and NSTEMI;  b. Subsequently changed to flecainide and xarelto;  c. 11/2013 Echo: EF 60-65%, mild LVH, mod MR/TR.  Marland Kitchen Sinus brady-tachy syndrome ([place])    a. Post-conversion pause of 7.2 seconds during 11/2012 hospitalization @ [facility]; b. 04/2017 Event Monitor: No significant arrhythmias.    Past Surgical History:  Procedure Laterality Date  . ABDOMINAL HYSTERECTOMY    . CARDIAC CATHETERIZATION  [facility]     [facility]  . [place]   2nd to left toe  . ESOPHAGOGASTRODUODENOSCOPY  10/1999   inflammation only   . FOREIGN BODY REMOVAL Right 01/02/2013   Procedure: FOREIGN BODY REMOVAL RIGHT INDEX FINGER;  Surgeon: Cammie Sickle., MD;  Location: [facility];  Service: Orthopedics;  Laterality: Right;  . HAMMER TOE SURGERY  11/15   Dr Milinda Pointer  . MASTECTOMY  1984   left   . MASTECTOMY  1985   right  . PACEMAKER IMPLANT N/A 09/20/2018   Procedure: PACEMAKER IMPLANT;  Surgeon: Deboraha Sprang, MD;  Location: [place];  Service: Cardiovascular;  Laterality: N/A;  . PARTIAL HYSTERECTOMY  1966   w/ bladder tack  . ROTATOR CUFF REPAIR  10/09   right   . TOE SURGERY  2001/2002   2nd/3rd,left     Family History  Problem Relation Age of Onset  . Heart failure Mother   . Gout Mother   . Heart disease Mother        heart failure  . Arthritis Mother   . Heart failure Father   . Heart disease Father        heart failure  . Diabetes Maternal Grandmother   . Colon cancer Maternal Grandfather   . Cancer Maternal Grandfather        colon  . Arthritis Brother     Social History   Socioeconomic History  . Marital status: Widowed    Spouse name: Not on file  . Number of children: 2  . Years of education: Not on file  . Highest education level: Not on file  Occupational History  . 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 Smoking status: Never Smoker  . Smokeless tobacco: Never Used  Vaping Use  . Vaping Use: Never used  Substance and Sexual Activity  . Alcohol use: No    Alcohol/week: 0.0 standard drinks  . Drug use: No  . Sexual activity: Not on file  Other Topics Concern  . Not on file  Social History Narrative   Now has living will   Sons are health care POAs   Has DNR   No tube feeds if cognitively unaware   Social Determinants of Health   Financial Resource Strain:   . Difficulty of Paying Living  Expenses:   Food Insecurity:   . Worried About Charity fundraiser in the Last Year:   . Arboriculturist in the Last Year:   Transportation Needs:   . Film/video editor (Medical):   Marland Kitchen Lack of Transportation (Non-Medical):   Physical Activity:   . Days of Exercise per Week:   . Minutes of Exercise per  Session:   Stress:   . Feeling of Stress :   Social Connections:   . Frequency of Communication with Friends and Family:   . Frequency of Social Gatherings with Friends and Family:   . Attends Religious Services:   . Active Member of Clubs or Organizations:   . Attends Archivist Meetings:   Marland Kitchen Marital Status:   Intimate Partner Violence:   . Fear of Current or Ex-Partner:   . Emotionally Abused:   Marland Kitchen Physically Abused:   . Sexually Abused:    Review of Systems Sleeps well Appetite is good Weight is stable Some decreased sensation in big toes    Objective:   Physical Exam Constitutional:      Appearance: Normal appearance.  Cardiovascular:     Rate and Rhythm: Normal rate and regular rhythm.     Pulses: Normal pulses.     Heart sounds: No murmur heard.  No gallop.   Pulmonary:     Effort: Pulmonary effort is normal.     Breath sounds: Normal breath sounds. No wheezing or rales.  Abdominal:     Palpations: Abdomen is soft.     Tenderness: There is no abdominal tenderness.  Musculoskeletal:     Cervical back: Neck supple.     Right lower leg: No edema.     Left lower leg: No edema.  Lymphadenopathy:     Cervical: No cervical adenopathy.  Skin:    Comments: No foot lesions  Neurological:     Mental Status: She is alert.  Psychiatric:     Comments: Mild psychomotor retardation but not clearly depressed            Assessment & Plan:

## 2020-02-25 NOTE — Assessment & Plan Note (Signed)
Doesn't seem to have active symptoms now--but hard to tell On losartan and diltiazem

## 2020-02-25 NOTE — Addendum Note (Signed)
Addended by: Pilar Grammes on: 02/25/2020 11:33 AM   Modules accepted: Orders

## 2020-02-25 NOTE — Assessment & Plan Note (Signed)
Hard to tell as she forgets Will try 3 days of antibiotic Will try imodium once a day due to diarrhea --for hygiene

## 2020-02-25 NOTE — Assessment & Plan Note (Signed)
Clearly worsening Discussed planning for increased care---24/7 in the future On donepezil

## 2020-02-25 NOTE — Patient Instructions (Signed)
[facility] into home care [facility] [place] [facility]

## 2020-02-25 NOTE — Assessment & Plan Note (Signed)
Last GFR down some but no action On the losartan

## 2020-02-27 LAB — URINE CULTURE
MICRO NUMBER:: 10704344
SPECIMEN QUALITY:: ADEQUATE

## 2020-02-28 ENCOUNTER — Other Ambulatory Visit: Payer: Self-pay | Admitting: Cardiovascular Disease

## 2020-03-01 NOTE — Telephone Encounter (Signed)
Refill Request.  

## 2020-03-01 NOTE — Telephone Encounter (Signed)
Pt's age 86, wt 71.2 kg, SCr 1.24, CrCl 33.89, last ov w/ JV 02/04/20.

## 2020-03-02 ENCOUNTER — Encounter: Payer: Self-pay | Admitting: Podiatry

## 2020-03-02 ENCOUNTER — Ambulatory Visit: Payer: PPO | Admitting: Podiatry

## 2020-03-02 ENCOUNTER — Other Ambulatory Visit: Payer: Self-pay

## 2020-03-02 DIAGNOSIS — L989 Disorder of the skin and subcutaneous tissue, unspecified: Secondary | ICD-10-CM

## 2020-03-02 DIAGNOSIS — M79676 Pain in unspecified toe(s): Secondary | ICD-10-CM | POA: Diagnosis not present

## 2020-03-02 DIAGNOSIS — B351 Tinea unguium: Secondary | ICD-10-CM

## 2020-03-02 NOTE — Progress Notes (Signed)
    Subjective: Patient is a 86 y.o. female presenting to the office today for follow up evaluation of painful callus lesion(s) noted to the bilateral feet. Walking and bearing weight increases the pain. She has not had any recent treatment for the symptoms.  Patient also complains of elongated, thickened nails that cause pain while ambulating in shoes. She is unable to trim her own nails. Patient presents today for further treatment and evaluation.  Past Medical History:  Diagnosis Date  . Allergic rhinitis   . Asthma   . Biceps tendon tear    a. right->s/p surgical repair winter of 2014.  . Diverticulosis of colon   . GERD (gastroesophageal reflux disease)   . Hammer toe of right foot   . Hx of colonic polyps   . HX: breast cancer   . Meniere's disease   . NSTEMI (non-ST elevated myocardial infarction) ([place])    a. 11/2012 in setting of rapid afib;  b. 11/2012 Cath: nl EF, nonobs CAD->Med Rx;  b. 11/2012 Echo: EF 60-65%, mild LVH, mild MR/TR.  . Osteoarthritis   . PAF (paroxysmal atrial fibrillation) ([place])    a. Eliquis and amio initiated 11/2012 in setting of admission with RVR and NSTEMI;  b. Subsequently changed to flecainide and xarelto;  c. 11/2013 Echo: EF 60-65%, mild LVH, mod MR/TR.  Marland Kitchen Sinus brady-tachy syndrome ([place])    a. Post-conversion pause of 7.2 seconds during 11/2012 hospitalization @ [facility]; b. 04/2017 Event Monitor: No significant arrhythmias.    Objective:  Physical Exam General: Alert and oriented x3 in no acute distress  Dermatology: Hyperkeratotic lesion(s) present on the bilateral feet. Pain on palpation with a central nucleated core noted. Skin is warm, dry and supple bilateral lower extremities. Negative for open lesions or macerations. Nails are tender, long, thickened and dystrophic with subungual debris, consistent with onychomycosis, 1-5 bilateral. No signs of infection noted.  Vascular: Palpable pedal pulses bilaterally. No edema or erythema noted. Capillary  refill within normal limits.  Neurological: Epicritic and protective threshold grossly intact bilaterally.   Musculoskeletal Exam: Pain on palpation at the keratotic lesion(s) noted. Range of motion within normal limits bilateral. Muscle strength 5/5 in all groups bilateral.  Assessment: 1. Onychodystrophic nails 1-5 bilateral with hyperkeratosis of nails.  2. Onychomycosis of nail due to dermatophyte bilateral 3. Pre-ulcerative callus lesions noted to the bilateral feet x 2   Plan of Care:  1. Patient evaluated. 2. Excisional debridement of keratoic lesion(s) using a chisel blade was performed without incident.  3. Dressed with light dressing. 4. Mechanical debridement of nails 1-5 bilaterally performed using a nail nipper. Filed with dremel without incident.  5. Patient is to return to the clinic in 3 months.   Edrick Kins, DPM [facility]  Dr. Edrick Kins, [address]                                        [place], [place] [address]                Office [phone]  Fax [phone]

## 2020-03-03 ENCOUNTER — Telehealth: Payer: Self-pay

## 2020-03-03 NOTE — Telephone Encounter (Signed)
Patient's daughter contacted the office and states that the patient has completed her abx for her UTI. She states that she is having urinary incontinence, but this is nothing new for her - and is otherwise not having any other sx. She does state that the patient has mentioned blood in her urine still, but the daughter states she has not seen any blood - and it is very hard to tell if what the patient is saying is accurate due to her dementia.  Patient;s daughter states she will continue to monitor her, and will let us know if any new sx arise, but is there anything Dr. [name] advises in the meantime?

## 2020-03-03 NOTE — Telephone Encounter (Signed)
Continue to push fluids, monitor symptoms.

## 2020-03-03 NOTE — Telephone Encounter (Signed)
Spoke to Leah Hayden. She is pushing fluids. Will keep on watch on her. It is hard to tell symptoms wise with her dementia.

## 2020-03-15 ENCOUNTER — Telehealth: Payer: PPO

## 2020-03-30 ENCOUNTER — Ambulatory Visit (INDEPENDENT_AMBULATORY_CARE_PROVIDER_SITE_OTHER): Payer: PPO | Admitting: *Deleted

## 2020-03-30 DIAGNOSIS — I495 Sick sinus syndrome: Secondary | ICD-10-CM

## 2020-03-30 LAB — CUP PACEART REMOTE DEVICE CHECK
Date Time Interrogation Session: 20210817075810
Implantable Lead Implant Date: 20200207
Implantable Lead Implant Date: 20200207
Implantable Lead Location: 753859
Implantable Lead Location: 753860
Implantable Lead Model: 5076
Implantable Pulse Generator Implant Date: 20200207
Pulse Gen Model: 407145
Pulse Gen Serial Number: 69503042

## 2020-03-31 NOTE — Progress Notes (Signed)
Remote pacemaker transmission.   

## 2020-05-17 DIAGNOSIS — F0390 Unspecified dementia without behavioral disturbance: Secondary | ICD-10-CM | POA: Diagnosis not present

## 2020-05-18 DIAGNOSIS — H6123 Impacted cerumen, bilateral: Secondary | ICD-10-CM | POA: Diagnosis not present

## 2020-05-24 DIAGNOSIS — F0390 Unspecified dementia without behavioral disturbance: Secondary | ICD-10-CM | POA: Diagnosis not present

## 2020-06-03 DIAGNOSIS — F0150 Vascular dementia without behavioral disturbance: Secondary | ICD-10-CM | POA: Diagnosis not present

## 2020-06-03 DIAGNOSIS — R5383 Other fatigue: Secondary | ICD-10-CM | POA: Diagnosis not present

## 2020-06-03 DIAGNOSIS — R410 Disorientation, unspecified: Secondary | ICD-10-CM | POA: Diagnosis not present

## 2020-06-04 ENCOUNTER — Ambulatory Visit: Payer: PPO | Admitting: Podiatry

## 2020-06-04 DIAGNOSIS — N390 Urinary tract infection, site not specified: Secondary | ICD-10-CM | POA: Diagnosis not present

## 2020-06-14 ENCOUNTER — Ambulatory Visit: Payer: PPO

## 2020-06-14 ENCOUNTER — Telehealth: Payer: Self-pay | Admitting: Internal Medicine

## 2020-06-14 ENCOUNTER — Telehealth: Payer: Self-pay

## 2020-06-14 ENCOUNTER — Other Ambulatory Visit: Payer: Self-pay

## 2020-06-14 NOTE — Patient Instructions (Signed)
Dear Onalee Hua B Bozman,  Below is a summary of the goals we discussed during our follow up appointment on June 14, 2020. Please contact me anytime with questions or concerns.   Visit Information  Goals Addressed            This Visit's Progress   . Pharmacy Care Plan       Current Barriers:  . Chronic Disease Management support, education, and care coordination needs related to AFIB, vascular dementia, allergic rhinitis, asthma, GERD, HLD, CKD, B12 deficiency   Pharmacist Clinical Goal(s):  Marland Kitchen Pharmacist will continue to review medication safety, drug interactions, and dosing with kidney function every 6 months . Review medications for de-prescribing  . Caregiver/patient will contact pharmacist with any medication needs  Interventions: . Comprehensive medication review performed. . Recommend discontinuation of vitamin D, montelukast, pantoprazole, and cranberry - consultation with Dr. [name]  Patient [place] Activities/Follow Up:  . Pharmacist will contact family with any medication changes   Please see past updates related to this goal by clicking on the "Past Updates" button in the selected goal       Patient verbalizes understanding of instructions provided today.   The pharmacy team will reach out to the patient again over the next 7 days.   [name], PharmD Clinical Pharmacist [facility] at [facility] [phone]

## 2020-06-14 NOTE — Chronic Care Management (AMB) (Signed)
Chronic Care Management Pharmacy  Name: Leah Hayden  MRN: [id number] DOB: 1927/10/12  Chief Complaint/ HPI  Leah Hayden,  86 y.o., female presents for their Follow-Up CCM visit with the clinical pharmacist via telephone. Spoke with patient's daughter-in-law, Leah Hayden [phone] home) who manages medications.  Leah Hayden reports patient is currently on cephalexin x 10 days for UTI - e. coli, managed by [facility]. Reports dementia continues to worsen. [facility] is scheduling meeting to provide hospice/social services. Family has begun staying with patient overnight the past several weeks. Leah Hayden (caregiver) stays with patient until 3 PM, family comes back around dinner. Per Leah Hayden, home health nurses have reported patient would like to take less medication and she would like to know which medications could be discontinued. She stopped Tallyn's donepezil 2 weeks ago and denies any worsening of dementia. Also stopped the multivitamin.   Current medications per daughter in law: Pantoprazole 40 mg - 1 every other day  Losartan 25 mg - 1 at night Xarelto 15 mg - 1 in afternoon Diltiazem 30 mg - 2 AM, 1 afternoon, 1 bedtime  Montelukast 10 mg - 1 evening Cranberry supplement - 2 at lunch Vitamin D 5000 IU - 1 at lunch B12 500 mcg - 1 every other day Imodium PRN diarrhea   Their chronic conditions include: AFIB, vascular dementia, allergic rhinitis, asthma, GERD, HLD, CKD, B12 deficiency   Last CCM visit: 09/24/19  PCP : Leah Carbon, MD   Specialists:   Leah Hayden, Cardiology  Leah Hayden,  Manages pacemaker  Leah Hayden, Podiatry  Allergies: amiodarone (chest discomfort), Eliquis (mild rash), oramycin (rash)  Recent Office Visits:  02/25/20:PCP/[facility] - dementia follow up, possible UTI, given antibiotic for 3 days, frequent diarrhea and ongoing incontinence, trial imodium once a day, check into 24/7 care   02/04/20: Cardiology/Leah Hayden - Increase Diltiazem (Cardizem) dose to: Take  2 tablets (60mg ) by mouth every morning, and 1 tablet (30mg ) by mouth in the afternoon and in the evening. Monitor your Blood Pressure 2 hours after your morning dose and also every evening.   09/03/19: [facility] - reduced PPI to every other day  Medications: Outpatient Encounter Medications as of 06/14/2020  Medication Sig  . albuterol (PROVENTIL HFA) 108 (90 BASE) MCG/ACT inhaler Inhale 2 puffs into the lungs every 6 (six) hours as needed for wheezing or shortness of breath.  (Patient not taking: Reported on 06/14/2020)  . cephALEXin (KEFLEX) 500 MG capsule Take 1 capsule (500 mg total) by mouth 3 (three) times daily.  Marland Kitchen CRANBERRY PO Take 2 tablets by mouth daily.  . cyanocobalamin 500 MCG tablet Take 500 mcg by mouth every other day.   . diltiazem (CARDIZEM) 30 MG tablet Take 2 tablets (60mg ) by mouth every morning, and 1 tablet (30mg ) by mouth in the afternoon and in the evening.  . donepezil (ARICEPT) 5 MG tablet TAKE 1 TABLET (5 MG TOTAL) BY MOUTH AT BEDTIME. (Patient not taking: Reported on 06/14/2020)  . Fluticasone-Salmeterol (ADVAIR DISKUS) 100-50 MCG/DOSE AEPB Inhale 1 puff into the lungs daily as needed (shortness of breath or wheezing).  (Patient not taking: Reported on 06/14/2020)  . hydroxypropyl methylcellulose / hypromellose (ISOPTO TEARS / GONIOVISC) 2.5 % ophthalmic solution Place 1 drop into both eyes 3 (three) times daily as needed for dry eyes.  Marland Kitchen loperamide (IMODIUM) 2 MG capsule Take by mouth as needed for diarrhea or loose stools.  Marland Kitchen losartan (COZAAR) 25 MG tablet Take 1 tablet (25 mg total) by mouth daily.  Marland Kitchen  montelukast (SINGULAIR) 10 MG tablet Take 10 mg by mouth every evening.   . Multiple Vitamins-Minerals (CENTRUM SILVER 50+WOMEN) TABS Take 1 tablet by mouth daily. (Patient not taking: Reported on 06/14/2020)  . nitroGLYCERIN (NITROSTAT) 0.4 MG SL tablet Place 1 tablet (0.4 mg total) under the tongue every 5 (five) minutes as needed for chest pain. (Patient not taking:  Reported on 06/14/2020)  . pantoprazole (PROTONIX) 40 MG tablet Take 1 tablet (40 mg total) by mouth every other day.  Marland Kitchen VITAMIN D PO Take 5,000 Units by mouth daily.   [name] 15 MG TABS tablet TAKE 1 TABLET (15 MG TOTAL) BY MOUTH DAILY WITH SUPPER.   No facility-administered encounter medications on file as of 06/14/2020.   Current Diagnosis/Assessment: Goals    . Pharmacy Care Plan     Current Barriers:  . Chronic Disease Management support, education, and care coordination needs related to AFIB, vascular dementia, allergic rhinitis, asthma, GERD, HLD, CKD, B12 deficiency   Pharmacist Clinical Goal(s):  Marland Kitchen Pharmacist will continue to review medication safety, drug interactions, and dosing with kidney function every 6 months . Review medications for de-prescribing  . Caregiver/patient will contact pharmacist with any medication needs  Interventions: . Comprehensive medication review performed. . Recommend discontinuation of vitamin D, montelukast, pantoprazole, and cranberry - consultation with Leah Hayden  Patient [place] Activities/Follow Up:  . Pharmacist will contact family with any medication changes   Please see past updates related to this goal by clicking on the "Past Updates" button in the selected goal       AFIB   CBC Latest Ref Rng & Units 02/04/2020 12/09/2019 09/05/2019  WBC 3.4 - 10.8 x10E3/uL 6.9 4.7 6.4  Hemoglobin 11.1 - 15.9 g/dL 12.6 13.4 12.8  Hematocrit 34.0 - 46.6 % 37.1 39.7 38.6  Platelets 150 - 450 x10E3/uL 214 194 211.0  CrCl: 34 ml/min  Managed by Dr. Tresa Hayden Patient is currently rate controlled.  Patient has failed these meds in past: none Patient is currently on the following medications:  Diltiazem 30 mg - 2 tablets every morning then 1 every 8 hours  (2 at breakfast, 1 at 2 PM, 1 at bedtime)  Xarelto 15 mg - 1 tablet daily with evening meal (takes at 2 PM)  We discussed: denies s/s of bleeding, Xarelto dosing appropriate for renal  function  Plan: Continue current medications  Asthma / Allergies  Symptoms: denies recent symptoms   Tobacco Status:  Social History   Tobacco Use  Smoking Status Never Smoker  Smokeless Tobacco Never Used   Patient has failed these meds in past: none reported Patient is currently controlled on the following medications:   Albuterol 90 mcg - 2 puffs every 6 hours as needed  Advair Diskus (fluticasone-salmeterol) 100-50 mcg - 1 puff daily as needed  Montelukast 10 mg - 1 tablet daily (2 PM)  Using maintenance inhaler regularly? No   Frequency of rescue inhaler use:  infrequently  We discussed:  Patient takes montelukast regularly and symptoms are controlled; uses inhalers PRN infrequently, no recent refills  Plan: Recommend discontinuing montelukast due to stable symptoms - consult with PCP   Hypertension  Office blood pressures are  BP Readings from Last 3 Encounters:  02/25/20 132/70  02/04/20 120/60  12/09/19 (!) 147/78   BMP Latest Ref Rng & Units 02/04/2020 12/09/2019 09/05/2019  Glucose 65 - 99 mg/dL 102(H) 108(H) 119(H)  BUN 10 - 36 mg/dL 25 26(H) 21  Creatinine 0.57 - 1.00 mg/dL 1.24(H)  1.05(H) 1.22(H)  BUN/Creat Ratio 12 - 28 20 - -  Sodium 134 - 144 mmol/L 139 142 139  Potassium 3.5 - 5.2 mmol/L 4.9 4.2 4.3  Chloride 96 - 106 mmol/L 102 107 103  CO2 20 - 29 mmol/L 26 27 29   Calcium 8.7 - 10.3 mg/dL 10.0 9.7 9.6   GFR: 41 ml/min (09/05/19) BP Goal < 140/90 mmHg Patient has failed these meds in the past: none Patient checks BP at home when feeling symptomatic Patient is currently controlled on the following medications:   Losartan 25 mg -  1 tablet every evening   Plan: Continue current medications   B12 Deficiency   Vitamin B12 (06/15/16) 1,051  Patient has failed these meds in past: none Patient is currently controlled on the following medications:   Vitamin B12 500 mcg - once every other day (lunch - 12 PM)  Plan: Continue current  medications  Dementia  Patient has failed these meds in past: donepezil 5 mg daily Patient is currently controlled on the following medications:   No pharmacotherapy  We discussed: failed therapy, prefers fewer medications  Plan: Continue non-pharmacotherapy management   GERD  Patient has failed these meds in past: none Patient is currently controlled on the following medications:   Pantoprazole 40 mg - one tablet every other day   We discussed: Dose was reduced 01/21 to QOD, patient has not noticeable a difference in symptoms. Denies acid reflux. PPI was started in hospital 07/2018, no GI bleed, noted to follow with GI is dark stools resume.   Plan: Recommend discontinuation of PPI due to stable symptoms, pt desire to reduce medications - consult with PCP   Over the counter/Supplements:   Patient is currently taking: Artificial tears, cranberry pill (UTI prevention), tylenol PRN, vitamin D 5000 IU daily, Imodium PRN  Assessment: Pt would like to reduce pill burden. No history of low vitamin D. Taking for bone health. Cranberry likely ineffective as patient is still experiencing frequent UTIs.   Plan: Recommend d/c cranberry due to ineffectiveness and vitamin D to reduce pill burden, consulted with PCP, recommend repeat vitamin D at [facility] on 06/16/20 to relay medication changes per PCP consult: d/c montelukast, pantoprazole, cranberry, and vitamin D   Medication Management:  Pharmacy: [facility]  Social support: Actuary Joycelyn Schmid): comes M-F 7:30 AM - 3 PM  Adherence: family fills pillbox, sitter gives patient medication during the day and lays out bedtime pills on a napkin (losartan, diltiazem)  CCM Follow up: 1 month telephone with [place], 3 months with pharmacist  [name], PharmD Clinical Pharmacist [facility] at [facility] [phone]

## 2020-06-14 NOTE — Telephone Encounter (Signed)
PC with Leah Cheeks NP with [facility] Has had a couple of recent home visits due to worsening status---empiric Rx for possible UTI with cephalexin not helpful  Also discussed situation with [name]---and family aware that her worsening now requires that she have 24 hour care (had been alone at night till now).  Will proceed with Palliative consult

## 2020-06-14 NOTE — Chronic Care Management (AMB) (Signed)
PCP consult regarding CCM visit 06/14/20:  Daughter in law is interested in de-prescribing review. Patient is asking if she could take less medications. Per discussion today, patient discontinued donepezil 2 weeks ago due to unseen benefit and denies any worsening of symptoms. She also discontinued her multivitamin this week.   She is currently taking the following medications: Pantoprazole 40 mg - 1 every other day  Losartan 25 mg - 1 at night Xarelto 15 mg - 1 in afternoon Diltiazem 30 mg - 2 AM, 1 afternoon, 1 bedtime  Montelukast 10 mg - 1 evening Cranberry supplement - 2 at lunch Vitamin D 5000 IU - 1 at lunch B12 500 mcg - 1 every other day Imodium PRN diarrhea   She denies any allergy or asthma symptoms. Denies any reflux symptoms. No history of GI bleed or complicated GERD. Recurrent UTIs despite cranberry supplement. No vitamin D level per chart. I recommend discontinuation of pantoprazole, montelukast, cranberry, and vitamin D.   Please let me know your recommendations and I will contact [name].  Thanks!  [name], PharmD Clinical Pharmacist [place] [facility] at [facility] [phone]

## 2020-06-18 ENCOUNTER — Telehealth: Payer: Self-pay | Admitting: Nurse Practitioner

## 2020-06-18 NOTE — Telephone Encounter (Signed)
Spoke with patient's daughter-in-law, Javen Ridings, regarding Palliative referral/services and all questions were answered and she was in agreement with scheduling visit.  I have scheduled an In-person Consult for 06/24/20 @ 3 PM

## 2020-06-23 DIAGNOSIS — N390 Urinary tract infection, site not specified: Secondary | ICD-10-CM | POA: Diagnosis not present

## 2020-06-24 ENCOUNTER — Encounter: Payer: Self-pay | Admitting: Nurse Practitioner

## 2020-06-24 ENCOUNTER — Other Ambulatory Visit: Payer: Self-pay

## 2020-06-24 ENCOUNTER — Telehealth: Payer: Self-pay

## 2020-06-24 ENCOUNTER — Other Ambulatory Visit: Payer: PPO | Admitting: Nurse Practitioner

## 2020-06-24 DIAGNOSIS — Z515 Encounter for palliative care: Secondary | ICD-10-CM

## 2020-06-24 DIAGNOSIS — F0281 Dementia in other diseases classified elsewhere with behavioral disturbance: Secondary | ICD-10-CM

## 2020-06-24 DIAGNOSIS — F02818 Dementia in other diseases classified elsewhere, unspecified severity, with other behavioral disturbance: Secondary | ICD-10-CM

## 2020-06-24 NOTE — Telephone Encounter (Signed)
Leah Hayden and Leah Hayden (DPR signed) were on call to let Dr [name] know that NP from Palliative care evaluated pt today; NP does not think pt should be by herself; pt needs 24 hr care. NP does not think pt would qualify for hospice care. The family is starting the conversation about placement  And the family is going to talk with social worker at [employer] who hopefully can assist with placement. Due to pt condition deteriorating and pts dementia worsening NP suggest pt to be started on seroquel for sedative for behavioral issues. NP is to put info in pts chart for Dr [name] to review. Leah Hayden request cb from Dr [name] with his input after reviewing this note.

## 2020-06-24 NOTE — Telephone Encounter (Signed)
Please let them know that I would only recommend seroquel if she has agitation or psychotic symptoms (like hallucinations, disturbing delusions, etc) It sounds like looking for placement with 24 hour supervision is a good idea

## 2020-06-24 NOTE — Telephone Encounter (Signed)
Hallucinations, agitations. Seeing people who are there. People on TV are making fun of her. Agitated yesterday when someone offered to clean her sheets then would not eat, drink, or move from the sofa. Asked if Dr [name] had any suggestions for placement. They have placed a call the [facility].

## 2020-06-24 NOTE — Progress Notes (Signed)
[facility] Consult Note Telephone: [phone]  Fax: [phone]  PATIENT NAME: Leah Hayden DOB: [birth year] MRN: [id number]  PRIMARY CARE PROVIDER:   Venia Carbon, MD  REFERRING PROVIDER:  Venia Carbon, MD [place],  [place] [address]  RESPONSIBLE PARTY:  Leah Hayden; Leah Hayden  I was asked to see Leah Hayden for Palliative care consult for goals of care  1. Advance Care Planning; DNR; in [place]  2. Goals of Care: Goals include to maximize quality of life and symptom management. Our advance care planning conversation included a discussion about:     The value and importance of advance care planning   Exploration of personal, cultural or spiritual beliefs that might influence medical decisions   Exploration of goals of care in the event of a sudden injury or illness   Identification and preparation of a healthcare agent   Review and updating or creation of an  advance directive document.  3. Palliative care encounter; Palliative care encounter; Palliative medicine team will continue to support patient, patient's family, and medical team. Visit consisted of counseling and education dealing with the complex and emotionally intense issues of symptom management and palliative care in the setting of serious and potentially life-threatening illness  4. Agitation secondary to dementia, likely declining; recommendation further discussion with Leah Hayden about possible use of seroquel or olazapine for worsening behaviors. Leah Hayden and Leah Hayden daughter in laws will further discuss with Leah Hayden.   5. F/u will call Leah Hayden, Leah Hayden daughter in law 1 week to schedule f/u PC visit  I spent 120 minutes providing this consultation,  from 2:15pm to 4:15pm. More than 50% of the time in this consultation was spent coordinating communication.   HISTORY OF PRESENT ILLNESS:  Leah Hayden is a 86 y.o. year old female  with multiple medical problems including Dementia, Meniere's disease, myocardial infarction, proximal atrial fibrillation, pacemaker, sinus brady tachy syndrome cardioversion 2014, history of breast cancer s/p mastectomy, chronic kidney disease, but him and B12 deficiency, history of colon polyps, history of diverticulosis, gerd, asthma, allergies, osteoarthritis, mood disorder with hallucinations, history of bicep tear on the right with surgical repair 2014, hammer toes with repair, partial hysterectomy, abdominal hysterectomy. Recently treated with Keflex for UTI; managed by [facility]. Leah Hayden resides in her own home. Initial Palliative care visit for medical goals of care. I spoke with Leah Hayden, Leah Hayden daughter-in-law prior to visiting with Leah Hayden. Leah Hayden and I talked about the last time Leah Hayden was independent at home able to function, working full-time about four or five years ago. Leah Hayden had to retire she started having more difficulty with her memory, showing early signs of dementia. Leah Hayden and I talked about past medical history. We talked progression of dementia over the last several years. We talked about recent episodes of UTIs as she's had three courses of Keflex and currently on Bactrim. Leah Hayden endorses it seems like the dementia is worsening with behaviors. Leah Hayden is ambulatory but does have falls. Leah Hayden does require assistance with bathing and dressing though she is resistant to care and will become agitated intermittently. Leah Hayden endorses it is harder to care for her in the home. Leah Hayden endorses they have caregivers during the day and have extended it to night time. It is getting more difficult. We talked about Leah Hayden cognition. Leah Hayden has difficulty expressing her words, processing. Leah Hayden endorsed Leah Hayden does  not recognize her Hayden of the time. We talked about upcoming family meeting with Leah Hayden Leah's Leah Hayden and Leah Hayden with both daughter-in-law's, Leah Hayden and Leah Hayden in order to come up with a  plan moving forward as it is becoming more difficult to care for Leah Hayden at home. Leah Hayden is a DNR. I visited Leah Hayden. Leah Hayden greeted me making eye contact, smiling though mumbling with intermittent clear words. It was very difficult to ask Leah Hayden questions at times she would be able to answer though typically no meaningful discussion with cognitive impairment. Leah Hayden and I talked about Leah Hayden  watching TV she believes that the people in the programs are actually in the room with her. We talked about hallucinating. We talked about concern for behavior with agitation. Leah Hayden was cooperative with assessment. Emotional support provided. I met separately Leah Hayden Leah's Leah Hayden with both daughter-in-laws Leah Hayden and Leah Hayden. We talked about Leah Hayden decline, progression of dementia. We talked about what they have been experiencing with ongoing decline. We talked about UTIs. We talked about symptoms including appetite. We talked at length about Leah Hayden current functional ability with realistic expectations as she continues to decline. We talked about concerns for dementia progressing and increasing skill level. We talked about possibly the option of adding seroquel or olazapine for behaviors. We talked at length about medication risks and benefits. We talked about concern for leaving Leah Hayden alone in the home with worsening dementia and safety concern. We talked about placement and different options concerning assisted Living with locked memory care unit versus skilled facility. We talked about the social worker from [facility] can help start the process if wishes are for placement. Leah Hayden endorses he feels at this point in time that would be the best thing to do is placements Leah Hayden as soon as possible. We talked about private care centers in 24-hour coverage. We talked about medicare, insurance and encourage family to look at the financial aspect to see what is best. We talked about role of Palliative care  and plan of care. Leah Hayden endorses she and Leah Hayden will contact Leah Alphonsus Sias to further discuss the behaviors with initiation of medication. Also we talked about possibly a hospital bed. Leah Hayden asked about a hospital bed being covered. Explain that if Leah Stolar was under Hospice Services it would be though under Palliative it is not it goes under durable medical equipment bill through insurance. Leah Hayden will further talk with Leah Lillia Mountain concerning the hospital bed. Discussed will contact Leah Hayden and 1 weeks to see about the discussions and if any decisions were made including medications. Will schedule follow-up visit with Palliative care if needed. Leah Hayden in agreement. We talked at length about Palliative care, medical goals of care, Hospice under the Medicare benefit including what is provided. Discuss that at present time Leah. [place] she remains ambulatory how am I likely would not qualify for Hospice at this point in time but if she continues to decline maybe soon. Therapeutic listening, emotional support provided. Questions answered to satisfaction. Contact information provided.  Palliative Care was asked to help address goals of care.   CODE STATUS: DNR  PPS: 50% HOSPICE ELIGIBILITY/DIAGNOSIS: TBD  PAST MEDICAL HISTORY:  Past Medical History:  Diagnosis Date  . Allergic rhinitis   . Asthma   . Biceps tendon tear    a. right->s/p surgical repair winter of 2014.  . Diverticulosis of colon   . GERD (gastroesophageal reflux disease)   . Hammer toe  of right foot   . Hx of colonic polyps   . HX: breast cancer   . Meniere's disease   . NSTEMI (non-ST elevated myocardial infarction) ([place])    a. 11/2012 in setting of rapid afib;  b. 11/2012 Cath: nl EF, nonobs CAD->Med Rx;  b. 11/2012 Echo: EF 60-65%, mild LVH, mild MR/TR.  . Osteoarthritis   . PAF (paroxysmal atrial fibrillation) ([facility])    a. Eliquis and amio initiated 11/2012 in setting of admission with RVR and NSTEMI;  b. Subsequently changed to flecainide  and xarelto;  c. 11/2013 Echo: EF 60-65%, mild LVH, mod MR/TR.  Marland Kitchen Sinus brady-tachy syndrome ([place])    a. Post-conversion pause of 7.2 seconds during 11/2012 hospitalization @ [facility]; b. 04/2017 Event Monitor: No significant arrhythmias.    SOCIAL HX:  Social History   Tobacco Use  . Smoking status: Never Smoker  . Smokeless tobacco: Never Used  Substance Use Topics  . Alcohol use: No    Alcohol/week: 0.0 standard drinks    ALLERGIES:  Allergies  Allergen Reactions  . Amiodarone Other (See Comments)    Chest discomfort  . Eliquis [Apixaban] Rash  . Other Rash    oramycin      PHYSICAL EXAM:   General: confused, debilitated female Cardiovascular: regular rate and rhythm Pulmonary: clear ant fields Neurological: unsteady gait  Cederick Broadnax Ihor Gully, NP

## 2020-06-25 MED ORDER — QUETIAPINE FUMARATE 25 MG PO TABS: 25.0000 mg | ORAL_TABLET | Freq: Every day | ORAL | 0 refills | Status: DC

## 2020-06-25 NOTE — Telephone Encounter (Signed)
Spoke to Leah Hayden. Rx sent to [facility]. Appt made for 07-01-20.

## 2020-06-25 NOTE — Telephone Encounter (Signed)
Okay to send order for quetiapine 25mg  daily at bedtime #30 x 0 I will need to see her soon to evaluate this. Not sure of best options for placement----I work at [facility] but they are on hold at this point (till their upcoming move). Best to work with a Education officer, museum

## 2020-06-29 ENCOUNTER — Ambulatory Visit (INDEPENDENT_AMBULATORY_CARE_PROVIDER_SITE_OTHER): Payer: PPO

## 2020-06-29 DIAGNOSIS — I495 Sick sinus syndrome: Secondary | ICD-10-CM

## 2020-06-29 LAB — CUP PACEART REMOTE DEVICE CHECK
Date Time Interrogation Session: 20211116073831
Implantable Lead Implant Date: 20200207
Implantable Lead Implant Date: 20200207
Implantable Lead Location: 753859
Implantable Lead Location: 753860
Implantable Lead Model: 5076
Implantable Pulse Generator Implant Date: 20200207
Pulse Gen Model: 407145
Pulse Gen Serial Number: 69503042

## 2020-06-30 NOTE — Progress Notes (Signed)
Remote pacemaker transmission.   

## 2020-07-01 ENCOUNTER — Ambulatory Visit (INDEPENDENT_AMBULATORY_CARE_PROVIDER_SITE_OTHER): Payer: PPO | Admitting: Internal Medicine

## 2020-07-01 ENCOUNTER — Other Ambulatory Visit: Payer: Self-pay

## 2020-07-01 ENCOUNTER — Encounter: Payer: Self-pay | Admitting: Internal Medicine

## 2020-07-01 VITALS — BP 134/70 | HR 60 | Temp 97.2°F | Ht 68.0 in | Wt 156.0 lb

## 2020-07-01 DIAGNOSIS — I480 Paroxysmal atrial fibrillation: Secondary | ICD-10-CM | POA: Diagnosis not present

## 2020-07-01 DIAGNOSIS — F0151 Vascular dementia with behavioral disturbance: Secondary | ICD-10-CM | POA: Diagnosis not present

## 2020-07-01 DIAGNOSIS — F01518 Vascular dementia, unspecified severity, with other behavioral disturbance: Secondary | ICD-10-CM

## 2020-07-01 DIAGNOSIS — F29 Unspecified psychosis not due to a substance or known physiological condition: Secondary | ICD-10-CM

## 2020-07-01 DIAGNOSIS — N1832 Chronic kidney disease, stage 3b: Secondary | ICD-10-CM

## 2020-07-01 MED ORDER — MEMANTINE HCL 10 MG PO TABS: 10.0000 mg | ORAL_TABLET | Freq: Two times a day (BID) | ORAL | 3 refills | Status: AC

## 2020-07-01 NOTE — Patient Instructions (Signed)
Please start the memantine as 10mg  daily for the next week---then increase to 10mg  twice a day. Let me know if the hallucinations and agitation is not any better within about 2 weeks.

## 2020-07-01 NOTE — Progress Notes (Signed)
Subjective:    Patient ID: Leah Hayden, female    DOB: [birth year], 86 y.o.   MRN: [id number]  HPI Here with DIL [name] and son Maudie Mercury for dementia follow up This visit occurred during the SARS-CoV-2 public health emergency.  Safety protocols were in place, including screening questions prior to the visit, additional usage of staff PPE, and extensive cleaning of exam room while observing appropriate contact time as indicated for disinfecting solutions.   Dementia has worsened considerably Now with nightly hallucinations---thinks people on TV are there/talking to her, etc Some paranoia--wants to close door to prevent people from looking at her  Now with aides all the time---with small gaps Aide in day---family for dinner---then night person Kim's wife makes all her meals--they make sure she heats and eats them  Much worse over the past week---though deteriorating before that Some agitation--now more frequent No obvious urinary urgency or dysuria. Was on bactrim --just finished  Will occasionally dress herself--needing more help lately Assist with showers Hasn't been going to bathroom alone lately----- to even find bathroom or commode once she is in Incontinent of urine and stool----occasionally makes it in times  Has been looking for placement Person from [facility] came 2 days ago--but she was not cooperative so that fell through  Last GFR down to 38  Current Outpatient Medications on File Prior to Visit  Medication Sig Dispense Refill  . albuterol (PROVENTIL HFA) 108 (90 BASE) MCG/ACT inhaler Inhale 2 puffs into the lungs every 6 (six) hours as needed for wheezing or shortness of breath.      . cyanocobalamin 500 MCG tablet Take 500 mcg by mouth every other day.     . diltiazem (CARDIZEM) 30 MG tablet Take 2 tablets (60mg ) by mouth every morning, and 1 tablet (30mg ) by mouth in the afternoon and in the evening. 120 tablet 6  . hydroxypropyl methylcellulose / hypromellose (ISOPTO  TEARS / GONIOVISC) 2.5 % ophthalmic solution Place 1 drop into both eyes 3 (three) times daily as needed for dry eyes.    Marland Kitchen loperamide (IMODIUM) 2 MG capsule Take by mouth as needed for diarrhea or loose stools.    Marland Kitchen losartan (COZAAR) 25 MG tablet Take 1 tablet (25 mg total) by mouth daily. 90 tablet 2  . nitroGLYCERIN (NITROSTAT) 0.4 MG SL tablet Place 1 tablet (0.4 mg total) under the tongue every 5 (five) minutes as needed for chest pain. 25 tablet 1  . QUEtiapine (SEROQUEL) 25 MG tablet Take 1 tablet (25 mg total) by mouth at bedtime. 30 tablet 0  . XARELTO 15 MG TABS tablet TAKE 1 TABLET (15 MG TOTAL) BY MOUTH DAILY WITH SUPPER. 90 tablet 1  . montelukast (SINGULAIR) 10 MG tablet Take 10 mg by mouth every evening.  (Patient not taking: Reported on 07/01/2020)     No current facility-administered medications on file prior to visit.    Allergies  Allergen Reactions  . Amiodarone Other (See Comments)    Chest discomfort  . Eliquis [Apixaban] Rash  . Other Rash    oramycin    Past Medical History:  Diagnosis Date  . Allergic rhinitis   . Asthma   . Biceps tendon tear    a. right->s/p surgical repair winter of 2014.  . Diverticulosis of colon   . GERD (gastroesophageal reflux disease)   . Hammer toe of right foot   . Hx of colonic polyps   . HX: breast cancer   . Meniere's disease   . NSTEMI (  non-ST elevated myocardial infarction) ([place])    a. 11/2012 in setting of rapid afib;  b. 11/2012 Cath: nl EF, nonobs CAD->Med Rx;  b. 11/2012 Echo: EF 60-65%, mild LVH, mild MR/TR.  . Osteoarthritis   . PAF (paroxysmal atrial fibrillation) ([place])    a. Eliquis and amio initiated 11/2012 in setting of admission with RVR and NSTEMI;  b. Subsequently changed to flecainide and xarelto;  c. 11/2013 Echo: EF 60-65%, mild LVH, mod MR/TR.  Marland Kitchen Sinus brady-tachy syndrome ([place])    a. Post-conversion pause of 7.2 seconds during 11/2012 hospitalization @ [facility]; b. 04/2017 Event Monitor: No significant  arrhythmias.    Past Surgical History:  Procedure Laterality Date  . ABDOMINAL HYSTERECTOMY    . CARDIAC CATHETERIZATION  11/2013   [facility]  . [place]   2nd to left toe  . ESOPHAGOGASTRODUODENOSCOPY  10/1999   inflammation only   . FOREIGN BODY REMOVAL Right 01/02/2013   Procedure: FOREIGN BODY REMOVAL RIGHT INDEX FINGER;  Surgeon: Cammie Sickle., MD;  Location: [facility];  Service: Orthopedics;  Laterality: Right;  . HAMMER TOE SURGERY  11/15   Dr Milinda Pointer  . MASTECTOMY  1984   left   . MASTECTOMY  1985   right  . PACEMAKER IMPLANT N/A 09/20/2018   Procedure: PACEMAKER IMPLANT;  Surgeon: Deboraha Sprang, MD;  Location: [facility];  Service: Cardiovascular;  Laterality: N/A;  . PARTIAL HYSTERECTOMY  1966   w/ bladder tack  . ROTATOR CUFF REPAIR  10/09   right   . TOE SURGERY  2001/2002   2nd/3rd,left     Family History  Problem Relation Age of Onset  . Heart failure Mother   . Gout Mother   . Heart disease Mother        heart failure  . Arthritis Mother   . Heart failure Father   . Heart disease Father        heart failure  . Diabetes Maternal Grandmother   . Colon cancer Maternal Grandfather   . Cancer Maternal Grandfather        colon  . Arthritis Brother     Social History   Socioeconomic History  . Marital status: Widowed    Spouse name: Not on file  . Number of children: 2  . Years of education: Not on file  . Highest education level: Not on file  Occupational History  . 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 Smoking status: Never Smoker  . Smokeless tobacco: Never Used  Vaping Use  . Vaping Use: Never used  Substance and Sexual Activity  . Alcohol use: No    Alcohol/week: 0.0 standard drinks  . Drug use: No  . Sexual activity: Not on file  Other Topics Concern  . Not on file  Social History Narrative   Now has living will   Sons are health care POAs   Has DNR    No tube feeds if cognitively unaware   Social Determinants of Health   Financial Resource Strain:   . Difficulty of Paying Living Expenses: Not on file  Food Insecurity:   . Worried About Charity fundraiser in the Last Year: Not on file  . Ran Out of Food in the Last Year: Not on file  Transportation Needs:   . Lack of Transportation (Medical): Not on file  . Lack of Transportation (Non-Medical): Not on file  Physical Activity:   . Days of Exercise per Week: Not on file  . Minutes of Exercise per Session: Not on file  Stress:   . Feeling of Stress : Not on file  Social Connections:   . Frequency of Communication with Friends and Family: Not on file  . Frequency of Social Gatherings with Friends and Family: Not on file  . Attends Religious Services: Not on file  . Active Member of Clubs or Organizations: Not on file  . Attends Archivist Meetings: Not on file  . Marital Status: Not on file  Intimate Partner Violence:   . Fear of Current or Ex-Partner: Not on file  . Emotionally Abused: Not on file  . Physically Abused: Not on file  . Sexually Abused: Not on file   Review of Systems Eats well most of the time---will not eat if she gets angry Trouble initiating at times--usually sleeps all night and some problems awakening her    Objective:   Physical Exam Constitutional:      Appearance: Normal appearance.  Cardiovascular:     Rate and Rhythm: Normal rate and regular rhythm.     Heart sounds: No murmur heard.  No gallop.   Pulmonary:     Effort: Pulmonary effort is normal.     Breath sounds: Normal breath sounds. No wheezing or rales.  Abdominal:     Palpations: Abdomen is soft.     Tenderness: There is no abdominal tenderness.  Musculoskeletal:     Cervical back: Neck supple.     Right lower leg: No edema.     Left lower leg: No edema.  Lymphadenopathy:     Cervical: No cervical adenopathy.  Neurological:     Mental Status: She is alert.     Comments:  Engages and talks but no logical words or sentences  Psychiatric:     Comments: Calm here            Assessment & Plan:

## 2020-07-01 NOTE — Assessment & Plan Note (Signed)
Hallucinations, delusions, paranoia No clear response to the quetiapine Will try memantine--then titrate the quetiapine prn

## 2020-07-01 NOTE — Assessment & Plan Note (Signed)
Will check labs again to be sure no decline I don't think her worsening is related to that

## 2020-07-01 NOTE — Assessment & Plan Note (Signed)
Striking worsening recently---?new stroke? Will check labs Now seeking placement for memory unit Will try memantine before increasing antipsychotics

## 2020-07-01 NOTE — Assessment & Plan Note (Signed)
Paced  On xarelto and diltiazem

## 2020-07-02 LAB — CBC
HCT: 39 % (ref 36.0–46.0)
Hemoglobin: 13.1 g/dL (ref 12.0–15.0)
MCHC: 33.7 g/dL (ref 30.0–36.0)
MCV: 93.9 fl (ref 78.0–100.0)
Platelets: 214 10*3/uL (ref 150.0–400.0)
RBC: 4.15 Mil/uL (ref 3.87–5.11)
RDW: 13.3 % (ref 11.5–15.5)
WBC: 7 10*3/uL (ref 4.0–10.5)

## 2020-07-02 LAB — RENAL FUNCTION PANEL
Albumin: 4.1 g/dL (ref 3.5–5.2)
BUN: 32 mg/dL — ABNORMAL HIGH (ref 6–23)
CO2: 27 mEq/L (ref 19–32)
Calcium: 10.1 mg/dL (ref 8.4–10.5)
Chloride: 104 mEq/L (ref 96–112)
Creatinine, Ser: 1.43 mg/dL — ABNORMAL HIGH (ref 0.40–1.20)
GFR: 32.31 mL/min — ABNORMAL LOW (ref >60.00)
Glucose, Bld: 120 mg/dL — ABNORMAL HIGH (ref 70–99)
Phosphorus: 3.6 mg/dL (ref 2.3–4.6)
Potassium: 4.8 mEq/L (ref 3.5–5.1)
Sodium: 138 mEq/L (ref 135–145)

## 2020-07-02 LAB — HEPATIC FUNCTION PANEL
ALT: 10 U/L (ref 0–35)
AST: 13 U/L (ref 0–37)
Albumin: 4.1 g/dL (ref 3.5–5.2)
Alkaline Phosphatase: 76 U/L (ref 39–117)
Bilirubin, Direct: 0.1 mg/dL (ref 0.0–0.3)
Total Bilirubin: 0.7 mg/dL (ref 0.2–1.2)
Total Protein: 7.2 g/dL (ref 6.0–8.3)

## 2020-07-03 LAB — QUANTIFERON-TB GOLD PLUS
Mitogen-NIL: 8.33 IU/mL
NIL: 0.06 IU/mL
QuantiFERON-TB Gold Plus: NEGATIVE
TB1-NIL: <0 IU/mL
TB2-NIL: <0 IU/mL

## 2020-07-12 ENCOUNTER — Telehealth: Payer: Self-pay | Admitting: Cardiovascular Disease

## 2020-07-12 NOTE — Telephone Encounter (Signed)
Patient daughter would like to change to telephone visit.  Please advise if ok.

## 2020-07-13 NOTE — Telephone Encounter (Signed)
Patient Consent for Virtual Visit    Leah Hayden has provided verbal consent on 07/13/2020 for a virtual visit (video or telephone).   CONSENT FOR VIRTUAL VISIT FOR:  Leah Hayden  By participating in this virtual visit I agree to the following:  I hereby voluntarily request, consent and authorize [facility] and its employed or contracted physicians, physician assistants, nurse practitioners or other licensed health care professionals (the Practitioner), to provide me with telemedicine health care services (the "Services") as deemed necessary by the treating Practitioner. I acknowledge and consent to receive the Services by the Practitioner via telemedicine. I understand that the telemedicine visit will involve communicating with the Practitioner through live audiovisual communication technology and the disclosure of certain medical information by electronic transmission. I acknowledge that I have been given the opportunity to request an in-person assessment or other available alternative prior to the telemedicine visit and am voluntarily participating in the telemedicine visit.  I understand that I have the right to withhold or withdraw my consent to the use of telemedicine in the course of my care at any time, without affecting my right to future care or treatment, and that the Practitioner or I may terminate the telemedicine visit at any time. I understand that I have the right to inspect all information obtained and/or recorded in the course of the telemedicine visit and may receive copies of available information for a reasonable fee.  I understand that some of the potential risks of receiving the Services via telemedicine include:  Marland Kitchen Delay or interruption in medical evaluation due to technological equipment failure or disruption; . Information transmitted may not be sufficient (e.g. poor resolution of images) to allow for appropriate medical decision making by the Practitioner; and/or    . In rare instances, security protocols could fail, causing a breach of personal health information.  Furthermore, I acknowledge that it is my responsibility to provide information about my medical history, conditions and care that is complete and accurate to the best of my ability. I acknowledge that Practitioner's advice, recommendations, and/or decision may be based on factors not within their control, such as incomplete or inaccurate data provided by me or distortions of diagnostic images or specimens that may result from electronic transmissions. I understand that the practice of medicine is not an exact science and that Practitioner makes no warranties or guarantees regarding treatment outcomes. I acknowledge that a copy of this consent can be made available to me via my patient portal ([facility] My Chart), or I can request a printed copy by calling the office of [facility].    I understand that my insurance will be billed for this visit.   I have read or had this consent read to me. . I understand the contents of this consent, which adequately explains the benefits and risks of the Services being provided via telemedicine.  . I have been provided ample opportunity to ask questions regarding this consent and the Services and have had my questions answered to my satisfaction. . I give my informed consent for the services to be provided through the use of telemedicine in my medical care  Spoke with daughter per release form she states that it is very difficult to get patient out for appointments and would like virtual or telephone appointment. Reviewed consent with her and she was agreeable due to decline in patients health. She states that it is not possible to get Internet services in their area and prefers telephone. She  states that we should call patients home number of [phone] and let her know to obtain vital signs for appointment. Also reviewed that there will be a call prior to  visit to verify medications as well. She will be there with patient during this call and was agreeable with date and time. Advised that I would also send consent through My Chart with request to reply approval. She verbalized understanding with no further questions at this time.

## 2020-07-13 NOTE — Telephone Encounter (Signed)
Call can not be completed at this time and to try again later.

## 2020-07-16 ENCOUNTER — Ambulatory Visit: Payer: PPO | Admitting: Podiatry

## 2020-07-16 ENCOUNTER — Other Ambulatory Visit: Payer: Self-pay

## 2020-07-16 ENCOUNTER — Encounter: Payer: Self-pay | Admitting: Podiatry

## 2020-07-16 DIAGNOSIS — L989 Disorder of the skin and subcutaneous tissue, unspecified: Secondary | ICD-10-CM

## 2020-07-16 DIAGNOSIS — B351 Tinea unguium: Secondary | ICD-10-CM

## 2020-07-16 DIAGNOSIS — M79676 Pain in unspecified toe(s): Secondary | ICD-10-CM | POA: Diagnosis not present

## 2020-07-16 NOTE — Progress Notes (Signed)
    Subjective: Patient is a 86 y.o. female presenting to the office today for follow up evaluation of painful callus lesion(s) noted to the bilateral feet. Walking and bearing weight increases the pain. She has not had any recent treatment for the symptoms.  Patient also complains of elongated, thickened nails that cause pain while ambulating in shoes. She is unable to trim her own nails. Patient presents today for further treatment and evaluation.  Past Medical History:  Diagnosis Date  . Allergic rhinitis   . Asthma   . Biceps tendon tear    a. right->s/p surgical repair winter of 2014.  . Diverticulosis of colon   . GERD (gastroesophageal reflux disease)   . Hammer toe of right foot   . Hx of colonic polyps   . HX: breast cancer   . Meniere's disease   . NSTEMI (non-ST elevated myocardial infarction) ([place])    a. 11/2012 in setting of rapid afib;  b. 11/2012 Cath: nl EF, nonobs CAD->Med Rx;  b. 11/2012 Echo: EF 60-65%, mild LVH, mild MR/TR.  . Osteoarthritis   . PAF (paroxysmal atrial fibrillation) ([place])    a. Eliquis and amio initiated 11/2012 in setting of admission with RVR and NSTEMI;  b. Subsequently changed to flecainide and xarelto;  c. 11/2013 Echo: EF 60-65%, mild LVH, mod MR/TR.  Marland Kitchen Sinus brady-tachy syndrome ([place])    a. Post-conversion pause of 7.2 seconds during 11/2012 hospitalization @ [facility]; b. 04/2017 Event Monitor: No significant arrhythmias.    Objective:  Physical Exam General: Alert and oriented x3 in no acute distress  Dermatology: Hyperkeratotic lesion(s) present on the bilateral feet. Pain on palpation with a central nucleated core noted. Skin is warm, dry and supple bilateral lower extremities. Negative for open lesions or macerations. Nails are tender, long, thickened and dystrophic with subungual debris, consistent with onychomycosis, 1-5 bilateral. No signs of infection noted.  Vascular: Palpable pedal pulses bilaterally. No edema or erythema noted. Capillary  refill within normal limits.  Neurological: Epicritic and protective threshold grossly intact bilaterally.   Musculoskeletal Exam: Pain on palpation at the keratotic lesion(s) noted. Range of motion within normal limits bilateral. Muscle strength 5/5 in all groups bilateral.  Assessment: 1. Onychodystrophic nails 1-5 bilateral with hyperkeratosis of nails.  2. Onychomycosis of nail due to dermatophyte bilateral 3. Pre-ulcerative callus lesions noted to the bilateral feet x 2   Plan of Care:  1. Patient evaluated. 2. Excisional debridement of keratoic lesion(s) using a chisel blade was performed without incident.  3. Dressed with light dressing. 4. Mechanical debridement of nails 1-5 bilaterally performed using a nail nipper. Filed with dremel without incident.  5. Patient is to return to the clinic in 3 months.   Edrick Kins, DPM [facility]  Dr. Edrick Kins, DPM    [address], [place] [address]                Office [phone]  Fax [phone]

## 2020-07-17 ENCOUNTER — Other Ambulatory Visit: Payer: Self-pay | Admitting: Internal Medicine

## 2020-07-19 ENCOUNTER — Telehealth: Payer: Self-pay | Admitting: *Deleted

## 2020-07-19 NOTE — Telephone Encounter (Signed)
I am glad to hear that. We will plan to review this at the appt scheduled in January

## 2020-07-19 NOTE — Telephone Encounter (Signed)
Patient's daughter-in-law [name] (on [place]) left a voicemail wanting to let Dr. [name] know that patient is doing better on the medication.

## 2020-07-20 ENCOUNTER — Telehealth: Payer: Self-pay

## 2020-07-20 NOTE — Chronic Care Management (AMB) (Signed)
Chronic Care Management Pharmacy Assistant   Name: Leah Hayden  MRN: [id number] DOB: [birth year]  Reason for Encounter: Disease State  Patient Questions:  1.  Have you seen any other providers since your last visit? Yes 07/16/20 Leah Hayden, DPM - Podiatry - No changes 07/01/20 Dr. Silvio Hayden - PCP- Start Memantine 10 mg daily for 1 week then increase to 10 mg twice a day.   2.  Any changes in your medicines or health? Yes 07/01/20 Dr. Silvio Hayden - PCP- Started Memantine 10 mg daily for 1 week then increase to 10 mg twice a day.    PCP : Leah Carbon, MD  Allergies:   Allergies  Allergen Reactions  . Amiodarone Other (See Comments)    Chest discomfort  . Eliquis [Apixaban] Rash  . Other Rash    oramycin    Medications: Outpatient Encounter Medications as of 07/20/2020  Medication Sig  . albuterol (PROVENTIL HFA) 108 (90 BASE) MCG/ACT inhaler Inhale 2 puffs into the lungs every 6 (six) hours as needed for wheezing or shortness of breath.    . cyanocobalamin 500 MCG tablet Take 500 mcg by mouth every other day.   . diltiazem (CARDIZEM) 30 MG tablet Take 2 tablets (60mg ) by mouth every morning, and 1 tablet (30mg ) by mouth in the afternoon and in the evening.  . hydroxypropyl methylcellulose / hypromellose (ISOPTO TEARS / GONIOVISC) 2.5 % ophthalmic solution Place 1 drop into both eyes 3 (three) times daily as needed for dry eyes.  Marland Kitchen loperamide (IMODIUM) 2 MG capsule Take by mouth as needed for diarrhea or loose stools.  Marland Kitchen losartan (COZAAR) 25 MG tablet Take 1 tablet (25 mg total) by mouth daily.  . memantine (NAMENDA) 10 MG tablet Take 1 tablet (10 mg total) by mouth 2 (two) times daily.  . montelukast (SINGULAIR) 10 MG tablet Take 10 mg by mouth every evening.  (Patient not taking: Reported on 07/01/2020)  . nitroGLYCERIN (NITROSTAT) 0.4 MG SL tablet Place 1 tablet (0.4 mg total) under the tongue every 5 (five) minutes as needed for chest pain.  Marland Kitchen QUEtiapine (SEROQUEL) 25 MG  tablet TAKE 1 TABLET BY MOUTH EVERYDAY AT BEDTIME  . XARELTO 15 MG TABS tablet TAKE 1 TABLET (15 MG TOTAL) BY MOUTH DAILY WITH SUPPER.   No facility-administered encounter medications on file as of 07/20/2020.    Current Diagnosis: Patient Active Problem List   Diagnosis Date Noted  . Psychosis ([place]) 07/01/2020  . Cystitis with hematuria 02/25/2020  . Corn of toe 10/28/2019  . Atherosclerosis of native coronary artery of native heart with angina pectoris ([place]) 09/03/2019  . Confusion 07/25/2019  . Hallucinations 07/25/2019  . Pedal edema 07/25/2019  . Abnormal urinalysis 07/25/2019  . Sinus node dysfunction ([place]) 09/20/2018  . Mood disorder ([place]) 06/26/2018  . Stage 3b chronic kidney disease ([place]) 06/26/2018  . Arrhythmia 04/12/2018  . Vitamin B12 deficiency 11/10/2015  . Vascular dementia with behavior disturbance ([place]) 10/08/2015  . Advance directive discussed with patient 06/07/2015  . Hyperlipidemia 05/13/2014  . PAF (paroxysmal atrial fibrillation) ([place])   . Routine general medical examination at a health care facility 05/03/2012  . SVT/ PSVT/ PAT 01/10/2010  . Sick sinus syndrome ([place]) 01/10/2010  . Generalized osteoarthrosis, involving multiple sites 12/18/2007  . ALLERGIC RHINITIS 03/07/2007  . Mild intermittent asthma in adult without complication 41/96/2229  . GERD 03/07/2007  . DIVERTICULOSIS, COLON 03/07/2007  . BREAST CANCER, HX OF 03/07/2007   Reviewed chart prior to disease  state call. Spoke with patient regarding BP  Recent Office Vitals: BP Readings from Last 3 Encounters:  07/01/20 134/70  02/25/20 132/70  02/04/20 120/60   Pulse Readings from Last 3 Encounters:  07/01/20 60  02/25/20 71  02/04/20 61    Wt Readings from Last 3 Encounters:  07/01/20 156 lb (70.8 kg)  02/25/20 157 lb (71.2 kg)  02/04/20 158 lb 8 oz (71.9 kg)     Kidney Function Lab Results  Component Value Date/Time   CREATININE 1.43 (H) 07/01/2020 02:42 PM   CREATININE 1.24  (H) 02/04/2020 11:44 AM   CREATININE 0.68 11/28/2013 10:22 AM   CREATININE 0.60 11/27/2013 10:08 AM   GFR 32.31 (L) 07/01/2020 02:42 PM   GFRNONAA 38 (L) 02/04/2020 11:44 AM   GFRNONAA >60 11/28/2013 10:22 AM   GFRAA 44 (L) 02/04/2020 11:44 AM   GFRAA >60 11/28/2013 10:22 AM    BMP Latest Ref Rng & Units 07/01/2020 02/04/2020 12/09/2019  Glucose 70 - 99 mg/dL 120(H) 102(H) 108(H)  BUN 6 - 23 mg/dL 32(H) 25 26(H)  Creatinine 0.40 - 1.20 mg/dL 1.43(H) 1.24(H) 1.05(H)  BUN/Creat Ratio 12 - 28 - 20 -  Sodium 135 - 145 mEq/L 138 139 142  Potassium 3.5 - 5.1 mEq/L 4.8 4.9 4.2  Chloride 96 - 112 mEq/L 104 102 107  CO2 19 - 32 mEq/L 27 26 27   Calcium 8.4 - 10.5 mg/dL 10.1 10.0 9.7    . Current antihypertensive regimen:  o Diltiazem 30 mg 2 tablets every morning, 1 in the afternoon and in the evening o Losartan 25 mg 1 tablet daily  . How often are you checking your Blood Pressure? when feeling symptomatic   . Current home BP readings: "Does not know off hand but has been within normal range".  . What recent interventions/DTPs have been made by any provider to improve Blood Pressure control since last CPP Visit: - NONE .  Marland Kitchen Any recent hospitalizations or ED visits since last visit with CPP? No   . What diet changes have been made to improve Blood Pressure Control?  o No changes   . What exercise is being done to improve your Blood Pressure Control?  o No changes  Adherence Review: Is the patient currently on ACE/ARB medication? Yes losartan Does the patient have >5 day gap between last estimated fill dates? No  Spoke with patient's daughter in law, Leah Hayden,  states she is doing fairly well. Does feel like she is getting stronger and more coherent after starting Memantine. She is able to verbalize things she wants and doesn't want. She seems to understand requests. She notes that sometimes she catches her walking around without her walker. They are still waiting for an opening at a  facility for her. She is doing well since stopping montelukast and pantoprazole. No ill effects from stopping.     Follow-Up:  Pharmacist Review   Leah Dus, CPP notified   Margaretmary Dys, [place] Pharmacy Assistant [phone]

## 2020-07-21 ENCOUNTER — Telehealth: Payer: Self-pay | Admitting: Nurse Practitioner

## 2020-07-21 NOTE — Telephone Encounter (Signed)
Rec'd call from patient's daughter-in-law, [name], wanting to reschedule the Palliative f/u visit on 07/26/20 to further out.  I have rescheduled visit for 08/16/20 @ 2:30 PM

## 2020-07-22 DIAGNOSIS — M542 Cervicalgia: Secondary | ICD-10-CM | POA: Diagnosis not present

## 2020-07-22 DIAGNOSIS — R519 Headache, unspecified: Secondary | ICD-10-CM | POA: Diagnosis not present

## 2020-07-22 DIAGNOSIS — F0390 Unspecified dementia without behavioral disturbance: Secondary | ICD-10-CM | POA: Diagnosis not present

## 2020-07-22 DIAGNOSIS — Z9183 Wandering in diseases classified elsewhere: Secondary | ICD-10-CM | POA: Diagnosis not present

## 2020-07-23 ENCOUNTER — Other Ambulatory Visit: Payer: Self-pay | Admitting: Internal Medicine

## 2020-07-27 ENCOUNTER — Ambulatory Visit: Payer: PPO | Admitting: Cardiovascular Disease

## 2020-08-08 ENCOUNTER — Emergency Department
Admission: EM | Admit: 2020-08-08 | Discharge: 2020-08-08 | Disposition: A | Discharge status: Home / Self Care | Payer: PPO | Attending: Emergency Medicine | Admitting: Emergency Medicine

## 2020-08-08 ENCOUNTER — Encounter: Payer: Self-pay | Admitting: Emergency Medicine

## 2020-08-08 ENCOUNTER — Emergency Department: Payer: PPO

## 2020-08-08 ENCOUNTER — Other Ambulatory Visit: Payer: Self-pay

## 2020-08-08 DIAGNOSIS — R8290 Unspecified abnormal findings in urine: Secondary | ICD-10-CM | POA: Diagnosis present

## 2020-08-08 DIAGNOSIS — Z853 Personal history of malignant neoplasm of breast: Secondary | ICD-10-CM | POA: Diagnosis not present

## 2020-08-08 DIAGNOSIS — N183 Chronic kidney disease, stage 3 unspecified: Secondary | ICD-10-CM | POA: Diagnosis not present

## 2020-08-08 DIAGNOSIS — Z955 Presence of coronary angioplasty implant and graft: Secondary | ICD-10-CM | POA: Insufficient documentation

## 2020-08-08 DIAGNOSIS — F0390 Unspecified dementia without behavioral disturbance: Secondary | ICD-10-CM | POA: Insufficient documentation

## 2020-08-08 DIAGNOSIS — I25110 Atherosclerotic heart disease of native coronary artery with unstable angina pectoris: Secondary | ICD-10-CM | POA: Diagnosis not present

## 2020-08-08 DIAGNOSIS — R4182 Altered mental status, unspecified: Secondary | ICD-10-CM | POA: Diagnosis not present

## 2020-08-08 DIAGNOSIS — N3000 Acute cystitis without hematuria: Secondary | ICD-10-CM | POA: Diagnosis not present

## 2020-08-08 DIAGNOSIS — Z7901 Long term (current) use of anticoagulants: Secondary | ICD-10-CM | POA: Diagnosis not present

## 2020-08-08 DIAGNOSIS — J4520 Mild intermittent asthma, uncomplicated: Secondary | ICD-10-CM | POA: Insufficient documentation

## 2020-08-08 DIAGNOSIS — R9431 Abnormal electrocardiogram [ECG] [EKG]: Secondary | ICD-10-CM | POA: Diagnosis not present

## 2020-08-08 DIAGNOSIS — W19XXXA Unspecified fall, initial encounter: Secondary | ICD-10-CM | POA: Diagnosis not present

## 2020-08-08 DIAGNOSIS — R531 Weakness: Secondary | ICD-10-CM | POA: Diagnosis not present

## 2020-08-08 LAB — URINALYSIS, COMPLETE (UACMP) WITH MICROSCOPIC
Bilirubin Urine: NEGATIVE
Glucose, UA: NEGATIVE mg/dL
Hgb urine dipstick: NEGATIVE
Ketones, ur: NEGATIVE mg/dL
Nitrite: NEGATIVE
Protein, ur: NEGATIVE mg/dL
Specific Gravity, Urine: 1.017 (ref 1.005–1.030)
WBC, UA: >50 WBC/hpf — ABNORMAL HIGH (ref 0–5)
pH: 5 (ref 5.0–8.0)

## 2020-08-08 LAB — BASIC METABOLIC PANEL
Anion gap: 10 (ref 5–15)
BUN: 30 mg/dL — ABNORMAL HIGH (ref 8–23)
CO2: 24 mmol/L (ref 22–32)
Calcium: 9.7 mg/dL (ref 8.9–10.3)
Chloride: 105 mmol/L (ref 98–111)
Creatinine, Ser: 1.04 mg/dL — ABNORMAL HIGH (ref 0.44–1.00)
GFR, Estimated: 51 mL/min — ABNORMAL LOW (ref >60)
Glucose, Bld: 115 mg/dL — ABNORMAL HIGH (ref 70–99)
Potassium: 4.2 mmol/L (ref 3.5–5.1)
Sodium: 139 mmol/L (ref 135–145)

## 2020-08-08 LAB — CBC
HCT: 39.7 % (ref 36.0–46.0)
Hemoglobin: 12.9 g/dL (ref 12.0–15.0)
MCH: 31 pg (ref 26.0–34.0)
MCHC: 32.5 g/dL (ref 30.0–36.0)
MCV: 95.4 fL (ref 80.0–100.0)
Platelets: 227 10*3/uL (ref 150–400)
RBC: 4.16 MIL/uL (ref 3.87–5.11)
RDW: 12.3 % (ref 11.5–15.5)
WBC: 8.6 10*3/uL (ref 4.0–10.5)
nRBC: 0 % (ref 0.0–0.2)

## 2020-08-08 LAB — TROPONIN I (HIGH SENSITIVITY): Troponin I (High Sensitivity): 6 ng/L (ref <18)

## 2020-08-08 MED ORDER — CEFTRIAXONE SODIUM 1 G IJ SOLR
1.0000 g | Freq: Once | INTRAMUSCULAR | Status: AC
  Administered 2020-08-08: 1 g via INTRAMUSCULAR
  Filled 2020-08-08: qty 10

## 2020-08-08 MED ORDER — LIDOCAINE HCL (PF) 1 % IJ SOLN
2.1000 mL | Freq: Once | INTRAMUSCULAR | Status: AC
  Administered 2020-08-08: 2.1 mL
  Filled 2020-08-08: qty 5

## 2020-08-08 MED ORDER — CEPHALEXIN 500 MG PO CAPS: 500.0000 mg | ORAL_CAPSULE | Freq: Two times a day (BID) | ORAL | 0 refills | Status: AC

## 2020-08-08 MED ORDER — HALOPERIDOL LACTATE 5 MG/ML IJ SOLN
1.0000 mg | Freq: Once | INTRAMUSCULAR | Status: AC
  Administered 2020-08-08: 1 mg via INTRAMUSCULAR
  Filled 2020-08-08: qty 1

## 2020-08-08 NOTE — ED Triage Notes (Signed)
Pt to ED via [facility] from home for generalized weakness. Pt has hx/o dementia. Pt unable to tell RN why she is here. Per first nurse note, pt has foul smelling urine. This RN attempted to call pts POA, Ray Buch but there was no answer.

## 2020-08-08 NOTE — ED Notes (Signed)
Pt's son here at bedside, pt immediately stopped screaming, and is calmer. Still not answering questions. States "Jeanell Sparrow is my older brother".

## 2020-08-08 NOTE — ED Notes (Signed)
Pt very confused at baseline, is screaming for "Ray, [employer] and other various names. Pt also screaming aloud "Please help me", when asked if pt needs anything she states "Please do, momma, Memaw". Is not answering questions, when asked what she needs, just screams louder.

## 2020-08-08 NOTE — ED Provider Notes (Signed)
[facility] Emergency Department Provider Note ____________________________________________   Event Date/Time   First MD Initiated Contact with Patient 08/08/20 1215     (approximate)  I have reviewed the triage vital signs and the nursing notes.   HISTORY  Chief Complaint Weakness  HPI Leah Hayden is a 86 y.o. female with history of dementia presents to the emergency department for elvaluation. After eating breakfast, her caregiver noticed that she was leaning forward in the chair and was not responding. She had a glazed look and was not speaking. EMS was called and she became more responsive and she was able to focus and speak per her baseline. She has dementia and alzheimer's but family felt that she had return to normal. Her caregiver also report noticing malodorous urine yesterday.      Past Medical History:  Diagnosis Date  . Allergic rhinitis   . Asthma   . Biceps tendon tear    a. right->s/p surgical repair winter of 2014.  . Diverticulosis of colon   . GERD (gastroesophageal reflux disease)   . Hammer toe of right foot   . Hx of colonic polyps   . HX: breast cancer   . Meniere's disease   . NSTEMI (non-ST elevated myocardial infarction) ([facility])    a. 11/2012 in setting of rapid afib;  b. 11/2012 Cath: nl EF, nonobs CAD->Med Rx;  b. 11/2012 Echo: EF 60-65%, mild LVH, mild MR/TR.  . Osteoarthritis   . PAF (paroxysmal atrial fibrillation) ([facility])    a. Eliquis and amio initiated 11/2012 in setting of admission with RVR and NSTEMI;  b. Subsequently changed to flecainide and xarelto;  c. 11/2013 Echo: EF 60-65%, mild LVH, mod MR/TR.  Marland Kitchen Sinus brady-tachy syndrome ([facility])    a. Post-conversion pause of 7.2 seconds during 11/2012 hospitalization @ [facility]; b. 04/2017 Event Monitor: No significant arrhythmias.    Patient Active Problem List   Diagnosis Date Noted  . Psychosis ([facility]) 07/01/2020  . Cystitis with hematuria 02/25/2020  . Corn of toe 10/28/2019  .  Atherosclerosis of native coronary artery of native heart with angina pectoris ([facility]) 09/03/2019  . Confusion 07/25/2019  . Hallucinations 07/25/2019  . Pedal edema 07/25/2019  . Abnormal urinalysis 07/25/2019  . Sinus node dysfunction ([facility]) 09/20/2018  . Mood disorder ([facility]) 06/26/2018  . Stage 3b chronic kidney disease ([facility]) 06/26/2018  . Arrhythmia 04/12/2018  . Vitamin B12 deficiency 11/10/2015  . Vascular dementia with behavior disturbance ([facility]) 10/08/2015  . Advance directive discussed with patient 06/07/2015  . Hyperlipidemia 05/13/2014  . PAF (paroxysmal atrial fibrillation) ([facility])   . Routine general medical examination at a health care facility 05/03/2012  . SVT/ PSVT/ PAT 01/10/2010  . Sick sinus syndrome ([facility]) 01/10/2010  . Generalized osteoarthrosis, involving multiple sites 12/18/2007  . ALLERGIC RHINITIS 03/07/2007  . Mild intermittent asthma in adult without complication 03/07/2007  . GERD 03/07/2007  . DIVERTICULOSIS, COLON 03/07/2007  . BREAST CANCER, HX OF 03/07/2007    Past Surgical History:  Procedure Laterality Date  . ABDOMINAL HYSTERECTOMY    . CARDIAC CATHETERIZATION  11/2013   [facility]  . CORRECTION HAMMER TOE  1997   2nd to left toe  . ESOPHAGOGASTRODUODENOSCOPY  10/1999   inflammation only   . FOREIGN BODY REMOVAL Right 01/02/2013   Procedure: FOREIGN BODY REMOVAL RIGHT INDEX FINGER;  Surgeon: Wyn Forster., MD;  Location: [facility];  Service: Orthopedics;  Laterality: Right;  . HAMMER TOE SURGERY  11/15  Dr Milinda Pointer  . MASTECTOMY  1984   left   . MASTECTOMY  1985   right  . PACEMAKER IMPLANT N/A 09/20/2018   Procedure: PACEMAKER IMPLANT;  Surgeon: Deboraha Sprang, MD;  Location: [facility];  Service: Cardiovascular;  Laterality: N/A;  . PARTIAL HYSTERECTOMY  1966   w/ bladder tack  . ROTATOR CUFF REPAIR  10/09   right   . TOE SURGERY  2001/2002   2nd/3rd,left     Prior to Admission medications   Medication Sig Start  Date End Date Taking? Authorizing Provider  albuterol (PROVENTIL HFA) 108 (90 BASE) MCG/ACT inhaler Inhale 2 puffs into the lungs every 6 (six) hours as needed for wheezing or shortness of breath.      [provider]  cephALEXin (KEFLEX) 500 MG capsule Take 1 capsule (500 mg total) by mouth 2 (two) times daily for 7 days. 08/08/20 08/15/20  Dezyrae Kensinger, Johnette Abraham B, FNP  cyanocobalamin 500 MCG tablet Take 500 mcg by mouth every other day.     [provider]  diltiazem (CARDIZEM) 30 MG tablet Take 2 tablets (60mg ) by mouth every morning, and 1 tablet (30mg ) by mouth in the afternoon and in the evening. 02/04/20   Marrianne Mood D, PA-C  hydroxypropyl methylcellulose / hypromellose (ISOPTO TEARS / GONIOVISC) 2.5 % ophthalmic solution Place 1 drop into both eyes 3 (three) times daily as needed for dry eyes.    [provider]  loperamide (IMODIUM) 2 MG capsule Take by mouth as needed for diarrhea or loose stools.    [provider]  losartan (COZAAR) 25 MG tablet Take 1 tablet (25 mg total) by mouth daily. 02/09/20   Deboraha Sprang, MD  memantine (NAMENDA) 10 MG tablet Take 1 tablet (10 mg total) by mouth 2 (two) times daily. 07/01/20   Venia Carbon, MD  montelukast (SINGULAIR) 10 MG tablet Take 10 mg by mouth every evening.  Patient not taking: Reported on 07/01/2020    [provider]  nitroGLYCERIN (NITROSTAT) 0.4 MG SL tablet Place 1 tablet (0.4 mg total) under the tongue every 5 (five) minutes as needed for chest pain. 02/22/18   Minna Merritts, MD  QUEtiapine (SEROQUEL) 25 MG tablet TAKE 1 TABLET BY MOUTH EVERYDAY AT BEDTIME 07/19/20   Venia Carbon, MD  XARELTO 15 MG TABS tablet TAKE 1 TABLET (15 MG TOTAL) BY MOUTH DAILY WITH SUPPER. 03/01/20   Gollan, Kathlene November, MD    Allergies Amiodarone, Eliquis [apixaban], and Other  Family History  Problem Relation Age of Onset  . Heart failure Mother   . Gout Mother   . Heart disease Mother         heart failure  . Arthritis Mother   . Heart failure Father   . Heart disease Father        heart failure  . Diabetes Maternal Grandmother   . Colon cancer Maternal Grandfather   . Cancer Maternal Grandfather        colon  . Arthritis Brother     Social History Social History   Tobacco Use  . Smoking status: Never Smoker  . Smokeless tobacco: Never Used  Vaping Use  . Vaping Use: Never used  Substance Use Topics  . Alcohol use: No    Alcohol/week: 0.0 standard drinks  . Drug use: No    Review of Systems  Level 5 Caveat: Dementia/alzheimer's ____   PHYSICAL EXAM:  VITAL SIGNS: ED Triage Vitals [08/08/20 1057]  Enc Vitals Group     BP (!) 113/47     Pulse Rate 66     Resp 16     Temp 98.5 F (36.9 C)     Temp Source Oral     SpO2 99 %     Weight      Height      Head Circumference      Peak Flow      Pain Score      Pain Loc      Pain Edu?      Excl. in [place]?     Constitutional: Alert and oriented. Well appearing and in no acute distress. Eyes: Conjunctivae are normal.  Head: Atraumatic. Nose: No congestion/rhinnorhea. Mouth/Throat: Mucous membranes are moist. Oropharynx non-erythematous. Neck: No stridor.   Hematological/Lymphatic/Immunilogical: No cervical lymphadenopathy. Cardiovascular: Normal rate, regular rhythm. Grossly normal heart sounds. Good peripheral circulation. Respiratory: Normal respiratory effort. No retractions. Lungs CTAB. Gastrointestinal: Soft. No distention. No abdominal bruits. Genitourinary:  Musculoskeletal: No lower extremity tenderness nor edema.  Neurologic: No gross focal neurologic deficits are appreciated. Skin:  Skin is warm, dry and intact. No rash noted. Psychiatric: Crying out for help; screaming  ____________________________________________   LABS (all labs ordered are listed, but only abnormal results are displayed)  Labs Reviewed  BASIC METABOLIC PANEL - Abnormal; Notable for the following components:       Result Value   Glucose, Bld 115 (*)    BUN 30 (*)    Creatinine, Ser 1.04 (*)    GFR, Estimated 51 (*)    All other components within normal limits  URINALYSIS, COMPLETE (UACMP) WITH MICROSCOPIC - Abnormal; Notable for the following components:   Color, Urine YELLOW (*)    APPearance CLOUDY (*)    Leukocytes,Ua LARGE (*)    WBC, UA >50 (*)    Bacteria, UA MANY (*)    All other components within normal limits  CBC  CBG MONITORING, ED  TROPONIN I (HIGH SENSITIVITY)  TROPONIN I (HIGH SENSITIVITY)   ____________________________________________  EKG  ED ECG REPORT I, Cielle Aguila, FNP-BC personally viewed and interpreted this ECG.   Date: 08/08/2020  EKG Time: 1411  Rate: 76  Rhythm: normal sinus rhythm  Axis: borderline left axis deviation   Intervals:none  ST&T Change: no ST elevation  ____________________________________________  RADIOLOGY  ED MD interpretation:    CT head negative for acute concerns/findings.  I, Sherrie George, personally viewed and evaluated these images (plain radiographs) as part of my medical decision making, as well as reviewing the written report by the radiologist.  Official radiology report(s): CT Head Wo Contrast  Result Date: 08/08/2020 CLINICAL DATA:  86 year old with mental status change. EXAM: CT HEAD WITHOUT CONTRAST TECHNIQUE: Contiguous axial images were obtained from the base of the skull through the vertex without intravenous contrast. COMPARISON:  07/26/2019 FINDINGS: Brain: Mild cerebral atrophy is stable. No evidence for acute hemorrhage, mass lesion, midline shift, hydrocephalus or large infarct. Old lacune infarct in the right basal ganglia. Vascular: No hyperdense vessel or unexpected calcification. Skull: Normal. Negative for fracture or focal lesion. Sinuses/Orbits: No acute finding. Other: Stable calcified density along the left side of the scalp. IMPRESSION: No acute intracranial abnormality. Electronically Signed   By: Markus Daft M.D.   On: 08/08/2020 13:12    ____________________________________________   PROCEDURES  Procedure(s) performed (including Critical Care):  Procedures  ____________________________________________   INITIAL IMPRESSION / ASSESSMENT AND PLAN     86 year old female presenting to the  emergency department for treatment and evaluation of symptoms as described in the HPI.  Plan will be to get labs, urinalysis, and CT of her head.  DIFFERENTIAL DIAGNOSIS  CVA, TIA, acute cystitis, acute psychosis  ED COURSE  Son now at bedside who states that she has been in a state of decline for the last couple of months.  She is being followed closely by Dr. [name]. Son has noticed that she sometimes leans to the left intermittently for the last couple of months. He did notice that last night, but she was otherwise in her normal state.   Urine culture from March 2021 shows E. Coli. PCP chart from 07/01/20 reviewed. Memantine was added to her daily medication regime due to hallucinations and agitation. Family feel that this medication has helped until her episode today.  Urinalysis is consistent with cystitis. Son states that she tends to act similar to how she is today when she has a UTI. Plan will be to get a culture and treat her today with IM Rocephin then send her home on Keflex for the next week. Son is comfortable with this plan. He states that she has 24 hour care givers and family nearby if needed. He was encouraged to have her return to the ER for symptoms of concern if unable to speak with or see PCP.    ___________________________________________   FINAL CLINICAL IMPRESSION(S) / ED DIAGNOSES  Final diagnoses:  Acute cystitis without hematuria     ED Discharge Orders         Ordered    cephALEXin (KEFLEX) 500 MG capsule  2 times daily        08/08/20 1433           De B Marcell was evaluated in Emergency Department on 08/08/2020 for the symptoms described in the  history of present illness. She was evaluated in the context of the global COVID-19 pandemic, which necessitated consideration that the patient might be at risk for infection with the SARS-CoV-2 virus that causes COVID-19. Institutional protocols and algorithms that pertain to the evaluation of patients at risk for COVID-19 are in a state of rapid change based on information released by regulatory bodies including the CDC and federal and state organizations. These policies and algorithms were followed during the patient's care in the ED.   Note:  This document was prepared using Dragon voice recognition software and may include unintentional dictation errors.   Victorino Dike, FNP 08/08/20 1433    Carrie Mew, MD 08/08/20 1501

## 2020-08-08 NOTE — ED Notes (Signed)
Patient transported to CT with son.

## 2020-08-08 NOTE — ED Triage Notes (Signed)
Pt in via EMS from home with c/o not feeling good. Pt ambulatory with walker and this am she was eating and then fell over. Pt with dementia. Pt also with foul odor to urine and has had intermittent UTI's sine June

## 2020-08-08 NOTE — Discharge Instructions (Signed)
Please have her follow-up with primary care in a couple weeks to have a repeat urinalysis.  See primary care sooner or return with her to the emergency department for symptoms of concern.

## 2020-08-11 ENCOUNTER — Telehealth: Payer: Self-pay

## 2020-08-11 NOTE — Telephone Encounter (Signed)
Okay for her to just be on the one memantine daily  When she goes to [place], I would recommend that transfer to the [facility] staff that goes to the facility for her care

## 2020-08-11 NOTE — Telephone Encounter (Signed)
Spoke to [name]. 

## 2020-08-11 NOTE — Telephone Encounter (Signed)
Spoke to Leah Hayden to follow-up about recent ER visit. She said she is on an antibiotic and they gave her a shot in the ER. She was unresponsive when they called EMS. She came around before they got there. Since she got on the antibiotic she is doing better.   Since her visit here she is doing better socially. She likes her new daytime caretaker.   She had not been sleeping well at night so they have not been giving her the memantine at night and she had been sleeping better until last night. Asking if it is ok for her to stay on 1 a day or do they need to figure out how to give her 2.   Its really hard to bring her in so they would rather not bring her in for a follow-up.   They are on the list to get her in to [place] in January.

## 2020-08-16 ENCOUNTER — Encounter: Payer: Self-pay | Admitting: Nurse Practitioner

## 2020-08-16 ENCOUNTER — Other Ambulatory Visit: Payer: Self-pay

## 2020-08-16 ENCOUNTER — Other Ambulatory Visit: Payer: PPO | Admitting: Nurse Practitioner

## 2020-08-16 DIAGNOSIS — F02818 Dementia in other diseases classified elsewhere, unspecified severity, with other behavioral disturbance: Secondary | ICD-10-CM

## 2020-08-16 DIAGNOSIS — Z515 Encounter for palliative care: Secondary | ICD-10-CM

## 2020-08-16 DIAGNOSIS — F0281 Dementia in other diseases classified elsewhere with behavioral disturbance: Secondary | ICD-10-CM

## 2020-08-16 NOTE — Progress Notes (Signed)
[facility] Consult Note Telephone: [phone]  Fax: [phone]  PATIENT NAME: Leah Hayden DOB: 1927-12-06 MRN: [id number]  PRIMARY CARE PROVIDER:   Venia Carbon, MD  REFERRING PROVIDER:  Venia Carbon, MD [place],  [place] [address]  RESPONSIBLE PARTY:  Son Leah Hayden; Son Leah Hayden  Due to the COVID-19 crisis, this visit was done via telemedicine from my office and it was initiated and consent by this patient and or family.  1.Advance Care Planning; DNR; in [place]  2. Goals of Care: Goals include to maximize quality of life and symptom management. Our advance care planning conversation included a discussion about:   The value and importance of advance care planning  Exploration of personal, cultural or spiritual beliefs that might influence medical decisions  Exploration of goals of care in the event of a sudden injury or illness  Identification and preparation of a healthcare agent  Review and updating or creation of anadvance directive document.  3.Palliative care encounter; Palliative care encounter; Palliative medicine team will continue to support patient, patient's family, and medical team. Visit consisted of counseling and education dealing with the complex and emotionally intense issues of symptom management and palliative care in the setting of serious and potentially life-threatening illness  4. Agitation secondary to dementia, improved  5. F/u 4 weeks for complex medical decision making  I spent 50 minutes providing this consultation,  from 2:00pm to 2:50pm. More than 50% of the time in this consultation was spent coordinating communication.   HISTORY OF PRESENT ILLNESS:  Leah Hayden is a 87 y.o. year old female with multiple medical problems including Dementia, Meniere's disease, myocardial infarction, proximal atrial fibrillation, pacemaker, sinus brady tachy syndrome  cardioversion 2014, history of breast cancer s/p mastectomy, chronic kidney disease, but him and B12 deficiency, history of colon polyps, history of diverticulosis, gerd, asthma, allergies, osteoarthritis, mood disorder with hallucinations, history of bicep tear on the right with surgical repair 2014, hammer toes with repair, partial hysterectomy, abdominal hysterectomy.  In-person palliative care follow-up visit change to telemedicine telephonic is video. I called Leah Most, [name] for confirmation of Palliative care visit and an agreement to change to telemedicine telephonic. We talked about purpose of Palliative care visit. [name] in agreement. We talked about [name] has been doing prune juice endorse is [name] has been doing okay. Appetite varies. Ms. Labra is just now getting over a very bad urinary tract infection for what she was seeing in the emergency department for. [name] has almost completed the antibiotics. No recent falls. We talked about symptoms but she currently is not experiencing. We talked about her appetite, food intake which has been good. No noted weight loss. We talked about meals being prepared by families. Meals on Wheels is pending through Education officer, museum for Leah Hayden We talked about behaviors because improved. At times there are a few caregivers that Ms Firestine does give more of a problem to the redirectable. We talked about medical goals of care. We talked about 24-hour caregivers. [name] is on the list for [facility] assisted living facility. Leah Hayden endorses she was told that the facility may have a vacancy in January. Family is very hopeful to have placements as it is getting more difficult to care for [name] at home. We talked about disease progression. Talked about role of palliative care and plan of care. Follow up Palliative care visit schedule for 4  weeks at home though if she has to move to [place] can continue to follow with palliative care at facility.  Leah Hayden in agreement. Therapeutic listening, emotional support provided. Contact information. Questions answered to satisfaction.  Palliative Care was asked to help to continue to address goals of care.   CODE STATUS: DNR  PPS: 40% HOSPICE ELIGIBILITY/DIAGNOSIS: TBD  PAST MEDICAL HISTORY:  Past Medical History:  Diagnosis Date  . Allergic rhinitis   . Asthma   . Biceps tendon tear    a. right->s/p surgical repair winter of 2014.  . Diverticulosis of colon   . GERD (gastroesophageal reflux disease)   . Hammer toe of right foot   . Hx of colonic polyps   . HX: breast cancer   . Meniere's disease   . NSTEMI (non-ST elevated myocardial infarction) (HCC)    a. 11/2012 in setting of rapid afib;  b. 11/2012 Cath: nl EF, nonobs CAD->Med Rx;  b. 11/2012 Echo: EF 60-65%, mild LVH, mild MR/TR.  . Osteoarthritis   . PAF (paroxysmal atrial fibrillation) (HCC)    a. Eliquis and amio initiated 11/2012 in setting of admission with RVR and NSTEMI;  b. Subsequently changed to flecainide and xarelto;  c. 11/2013 Echo: EF 60-65%, mild LVH, mod MR/TR.  Marland Kitchen Sinus brady-tachy syndrome (HCC)    a. Post-conversion pause of 7.2 seconds during 11/2012 hospitalization @ [facility]; b. 04/2017 Event Monitor: No significant arrhythmias.    SOCIAL HX:  Social History   Tobacco Use  . Smoking status: Never Smoker  . Smokeless tobacco: Never Used  Substance Use Topics  . Alcohol use: No    Alcohol/week: 0.0 standard drinks    ALLERGIES:  Allergies  Allergen Reactions  . Amiodarone Other (See Comments)    Chest discomfort  . Eliquis [Apixaban] Rash  . Other Rash    oramycin     PERTINENT MEDICATIONS:  Outpatient Encounter Medications as of 08/16/2020  Medication Sig  . albuterol (PROVENTIL HFA) 108 (90 BASE) MCG/ACT inhaler Inhale 2 puffs into the lungs every 6 (six) hours as needed for wheezing or shortness of breath.    . cyanocobalamin 500 MCG tablet Take 500 mcg by mouth every other day.   . diltiazem  (CARDIZEM) 30 MG tablet Take 2 tablets (60mg ) by mouth every morning, and 1 tablet (30mg ) by mouth in the afternoon and in the evening.  . hydroxypropyl methylcellulose / hypromellose (ISOPTO TEARS / GONIOVISC) 2.5 % ophthalmic solution Place 1 drop into both eyes 3 (three) times daily as needed for dry eyes.  loperamide (IMODIUM) 2 MG capsule Take by mouth as needed for diarrhea or loose stools.  losartan (COZAAR) 25 MG tablet Take 1 tablet (25 mg total) by mouth daily.  . memantine (NAMENDA) 10 MG tablet Take 1 tablet (10 mg total) by mouth 2 (two) times daily.  . montelukast (SINGULAIR) 10 MG tablet Take 10 mg by mouth every evening.  (Patient not taking: Reported on 07/01/2020)  . nitroGLYCERIN (NITROSTAT) 0.4 MG SL tablet Place 1 tablet (0.4 mg total) under the tongue every 5 (five) minutes as needed for chest pain.  Marland Kitchen QUEtiapine (SEROQUEL) 25 MG tablet TAKE 1 TABLET BY MOUTH EVERYDAY AT BEDTIME  . XARELTO 15 MG TABS tablet TAKE 1 TABLET (15 MG TOTAL) BY MOUTH DAILY WITH SUPPER.   No facility-administered encounter medications on file as of 08/16/2020.    PHYSICAL EXAM:   Deferred  Kenna Kirn Z Mikhia Dusek, NP

## 2020-08-18 ENCOUNTER — Telehealth: Payer: PPO | Admitting: Cardiovascular Disease

## 2020-08-19 DIAGNOSIS — M542 Cervicalgia: Secondary | ICD-10-CM | POA: Diagnosis not present

## 2020-08-19 DIAGNOSIS — R519 Headache, unspecified: Secondary | ICD-10-CM | POA: Diagnosis not present

## 2020-08-19 DIAGNOSIS — G8929 Other chronic pain: Secondary | ICD-10-CM | POA: Diagnosis not present

## 2020-08-20 ENCOUNTER — Other Ambulatory Visit: Payer: Self-pay | Admitting: Internal Medicine

## 2020-08-23 DIAGNOSIS — M542 Cervicalgia: Secondary | ICD-10-CM | POA: Diagnosis not present

## 2020-08-23 DIAGNOSIS — G8929 Other chronic pain: Secondary | ICD-10-CM | POA: Diagnosis not present

## 2020-08-23 DIAGNOSIS — R519 Headache, unspecified: Secondary | ICD-10-CM | POA: Diagnosis not present

## 2020-08-31 ENCOUNTER — Ambulatory Visit: Payer: PPO | Admitting: Internal Medicine

## 2020-09-08 ENCOUNTER — Other Ambulatory Visit: Payer: Self-pay

## 2020-09-08 ENCOUNTER — Encounter: Payer: PPO | Admitting: Internal Medicine

## 2020-09-08 ENCOUNTER — Telehealth (INDEPENDENT_AMBULATORY_CARE_PROVIDER_SITE_OTHER): Payer: PPO | Admitting: Cardiovascular Disease

## 2020-09-08 ENCOUNTER — Encounter: Payer: Self-pay | Admitting: Cardiovascular Disease

## 2020-09-08 VITALS — BP 111/54 | HR 71 | Temp 97.3°F | Wt 160.0 lb

## 2020-09-08 DIAGNOSIS — I495 Sick sinus syndrome: Secondary | ICD-10-CM

## 2020-09-08 DIAGNOSIS — I480 Paroxysmal atrial fibrillation: Secondary | ICD-10-CM

## 2020-09-08 DIAGNOSIS — F01518 Vascular dementia, unspecified severity, with other behavioral disturbance: Secondary | ICD-10-CM

## 2020-09-08 DIAGNOSIS — I25119 Atherosclerotic heart disease of native coronary artery with unspecified angina pectoris: Secondary | ICD-10-CM

## 2020-09-08 DIAGNOSIS — F0151 Vascular dementia with behavioral disturbance: Secondary | ICD-10-CM

## 2020-09-08 NOTE — Progress Notes (Addendum)
Virtual Visit via Telephone Note   This visit type was conducted due to national recommendations for restrictions regarding the COVID-19 Pandemic (e.g. social distancing) in an effort to limit this patient's exposure and mitigate transmission in our community.  Due to her co-morbid illnesses, this patient is at least at moderate risk for complications without adequate follow up.  This format is felt to be most appropriate for this patient at this time.  The patient did not have access to video technology/had technical difficulties with video requiring transitioning to audio format only (telephone).  All issues noted in this document were discussed and addressed.  No physical exam could be performed with this format.  Please refer to the patient's chart for her  consent to telehealth for [facility].   I connected with  Leah Hayden on 09/09/20 by a video enabled telemedicine application and verified that I am speaking with the correct person using two identifiers. I discussed the limitations of evaluation and management by telemedicine. The patient expressed understanding and agreed to proceed.   Evaluation Performed:  Follow-up visit  Date:  09/09/2020   ID:  Leah Hayden, DOB Aug 12, 1928, MRN [id number]  Patient Location:  [place] [address]   Provider location:   [facility], [place] office  PCP:  Venia Carbon, MD  Cardiologist:  Patsy Baltimore  Chief Complaint  Patient presents with  . OTher    Patient is c/o head hurting and back hurting. Meds reviewed verbally with patient.     History of Present Illness:    Leah Hayden is a 87 y.o. female who presents via audio/video conferencing for a telehealth visit today.   The patient does not symptoms concerning for COVID-19 infection (fever, chills, cough, or new SHORTNESS OF BREATH).   Patient has a past medical history of  NSTEMI 11/2013, nonobstructive CAD by [facility] 11/2013,  PAF on  Xarelto complicated by posttermination pauses,  SVT,  tachybradycardia syndrome,  dementia,  breast cancer,  Mnire's disease,  asthma, diverticulosis, and GERD  Recent hospitalization April 2020 atrial fibrillation with RVR and elevated troponin  Who presents for discussion of her blood pressure and atrial fibrillation, hospital follow-up  Seen by one of our providers June 2021 At that time blood pressure elevated, malaise, visual disturbance, more confused Had frequent diarrhea Reported medication compliance  On discussion today, daughter with her Lives at home, with care givers ([facility]) Care 24/7 Past several days, sleeping a lot Uses a Walker, no falls Eating well, 160 pounds  No significant swelling, shortness of breath or chest pain  Blood pressure running low today [id number] systolic Typically blood pressure 130  Takes diltiazem 60 mg in the morning, 30 mg afternoon, 30 before bed Does not check blood pressure on a regular basis  Recent urinary tract infection In the emergency room August 08, 2020, treated with antibiotics  Family has been monitoring for symptoms of atrial fibrillation, have not noticed anything   Prior CV studies:   The following studies were reviewed today:  Echo 03/2018 - Left ventricle: The cavity size was normal. There was mild   concentric hypertrophy. Systolic function was normal. The   estimated ejection fraction was in the range of 60% to 65%. Wall   motion was normal; there were no regional wall motion   abnormalities. Doppler parameters are consistent with abnormal   left ventricular relaxation (grade 1 diastolic dysfunction). - Left atrium: The atrium was  mildly dilated. - Right ventricle: Systolic function was normal. - Pulmonary arteries: Systolic pressure was within the normal   range.   Past Medical History:  Diagnosis Date  . Allergic rhinitis   . Asthma   . Biceps tendon tear    a. right->s/p surgical repair winter of  2014.  . Diverticulosis of colon   . GERD (gastroesophageal reflux disease)   . Hammer toe of right foot   . Hx of colonic polyps   . HX: breast cancer   . Meniere's disease   . NSTEMI (non-ST elevated myocardial infarction) ([place])    a. 11/2012 in setting of rapid afib;  b. 11/2012 Cath: nl EF, nonobs CAD->Med Rx;  b. 11/2012 Echo: EF 60-65%, mild LVH, mild MR/TR.  . Osteoarthritis   . PAF (paroxysmal atrial fibrillation) ([place])    a. Eliquis and amio initiated 11/2012 in setting of admission with RVR and NSTEMI;  b. Subsequently changed to flecainide and xarelto;  c. 11/2013 Echo: EF 60-65%, mild LVH, mod MR/TR.  Marland Kitchen Sinus brady-tachy syndrome ([place])    a. Post-conversion pause of 7.2 seconds during 11/2012 hospitalization @ [facility]; b. 04/2017 Event Monitor: No significant arrhythmias.   Past Surgical History:  Procedure Laterality Date  . ABDOMINAL HYSTERECTOMY    . CARDIAC CATHETERIZATION  11/2013   [facility]  . [place]   2nd to left toe  . ESOPHAGOGASTRODUODENOSCOPY  10/1999   inflammation only   . FOREIGN BODY REMOVAL Right 01/02/2013   Procedure: FOREIGN BODY REMOVAL RIGHT INDEX FINGER;  Surgeon: Cammie Sickle., MD;  Location: [facility];  Service: Orthopedics;  Laterality: Right;  . HAMMER TOE SURGERY  11/15   Dr Milinda Pointer  . MASTECTOMY  1984   left   . MASTECTOMY  1985   right  . PACEMAKER IMPLANT N/A 09/20/2018   Procedure: PACEMAKER IMPLANT;  Surgeon: Deboraha Sprang, MD;  Location: [facility];  Service: Cardiovascular;  Laterality: N/A;  . PARTIAL HYSTERECTOMY  1966   w/ bladder tack  . ROTATOR CUFF REPAIR  10/09   right   . TOE SURGERY  2001/2002   2nd/3rd,left      Current Meds  Medication Sig  . albuterol (VENTOLIN HFA) 108 (90 Base) MCG/ACT inhaler Inhale 2 puffs into the lungs every 6 (six) hours as needed for wheezing or shortness of breath.    . cyanocobalamin 500 MCG tablet Take 500 mcg by mouth every other day.   . diltiazem  (CARDIZEM) 30 MG tablet Take 2 tablets (60mg ) by mouth every morning, and 1 tablet (30mg ) by mouth in the afternoon and in the evening.  . hydroxypropyl methylcellulose / hypromellose (ISOPTO TEARS / GONIOVISC) 2.5 % ophthalmic solution Place 1 drop into both eyes 3 (three) times daily as needed for dry eyes.  Marland Kitchen loperamide (IMODIUM) 2 MG capsule Take by mouth as needed for diarrhea or loose stools.  Marland Kitchen losartan (COZAAR) 25 MG tablet Take 1 tablet (25 mg total) by mouth daily.  . memantine (NAMENDA) 10 MG tablet Take 1 tablet (10 mg total) by mouth 2 (two) times daily.  . montelukast (SINGULAIR) 10 MG tablet Take 10 mg by mouth every evening.  . nitroGLYCERIN (NITROSTAT) 0.4 MG SL tablet Place 1 tablet (0.4 mg total) under the tongue every 5 (five) minutes as needed for chest pain.  Marland Kitchen QUEtiapine (SEROQUEL) 25 MG tablet TAKE 1 TABLET BY MOUTH EVERYDAY AT BEDTIME  . XARELTO 15 MG TABS tablet  TAKE 1 TABLET (15 MG TOTAL) BY MOUTH DAILY WITH SUPPER.     Allergies:   Amiodarone, Eliquis [apixaban], and Other   Social History   Tobacco Use  . Smoking status: Never Smoker  . Smokeless tobacco: Never Used  Vaping Use  . Vaping Use: Never used  Substance Use Topics  . Alcohol use: No    Alcohol/week: 0.0 standard drinks  . Drug use: No     Current Outpatient Medications on File Prior to Visit  Medication Sig Dispense Refill  . albuterol (VENTOLIN HFA) 108 (90 Base) MCG/ACT inhaler Inhale 2 puffs into the lungs every 6 (six) hours as needed for wheezing or shortness of breath.      . cyanocobalamin 500 MCG tablet Take 500 mcg by mouth every other day.     . diltiazem (CARDIZEM) 30 MG tablet Take 2 tablets (60mg ) by mouth every morning, and 1 tablet (30mg ) by mouth in the afternoon and in the evening. 120 tablet 6  . hydroxypropyl methylcellulose / hypromellose (ISOPTO TEARS / GONIOVISC) 2.5 % ophthalmic solution Place 1 drop into both eyes 3 (three) times daily as needed for dry eyes.    Marland Kitchen  loperamide (IMODIUM) 2 MG capsule Take by mouth as needed for diarrhea or loose stools.    Marland Kitchen losartan (COZAAR) 25 MG tablet Take 1 tablet (25 mg total) by mouth daily. 90 tablet 2  . memantine (NAMENDA) 10 MG tablet Take 1 tablet (10 mg total) by mouth 2 (two) times daily. 60 tablet 3  . montelukast (SINGULAIR) 10 MG tablet Take 10 mg by mouth every evening.    . nitroGLYCERIN (NITROSTAT) 0.4 MG SL tablet Place 1 tablet (0.4 mg total) under the tongue every 5 (five) minutes as needed for chest pain. 25 tablet 1  . QUEtiapine (SEROQUEL) 25 MG tablet TAKE 1 TABLET BY MOUTH EVERYDAY AT BEDTIME 90 tablet 0  . XARELTO 15 MG TABS tablet TAKE 1 TABLET (15 MG TOTAL) BY MOUTH DAILY WITH SUPPER. 90 tablet 1   No current facility-administered medications on file prior to visit.     Family Hx: The patient's family history includes Arthritis in her brother and mother; Cancer in her maternal grandfather; Colon cancer in her maternal grandfather; Diabetes in her maternal grandmother; Gout in her mother; Heart disease in her father and mother; Heart failure in her father and mother.  ROS:   Please see the history of present illness.    Review of Systems  Constitutional: Negative.   Respiratory: Negative.   Cardiovascular: Negative.   Gastrointestinal: Negative.   Musculoskeletal: Negative.   Neurological: Negative.   Psychiatric/Behavioral: Positive for memory loss.  All other systems reviewed and are negative.    Labs/Other Tests and Data Reviewed:    Recent Labs: 02/04/2020: TSH 1.650 07/01/2020: ALT 10 08/08/2020: BUN 30; Creatinine, Ser 1.04; Hemoglobin 12.9; Platelets 227; Potassium 4.2; Sodium 139   Recent Lipid Panel Lab Results  Component Value Date/Time   CHOL 154 07/25/2018 03:10 PM   CHOL 122 01/16/2014 08:43 AM   TRIG 41 07/25/2018 03:10 PM   HDL 54 07/25/2018 03:10 PM   HDL 70 01/16/2014 08:43 AM   CHOLHDL 2.9 07/25/2018 03:10 PM   LDLCALC 92 07/25/2018 03:10 PM   LDLCALC 38  01/16/2014 08:43 AM    Wt Readings from Last 3 Encounters:  09/08/20 160 lb (72.6 kg)  07/01/20 156 lb (70.8 kg)  02/25/20 157 lb (71.2 kg)     Exam:    Vital  Signs: Vital signs may also be detailed in the HPI BP (!) 111/54   Pulse 71   Temp (!) 97.3 F (36.3 C)   Wt 160 lb (72.6 kg)   BMI 24.33 kg/m   Wt Readings from Last 3 Encounters:  09/08/20 160 lb (72.6 kg)  07/01/20 156 lb (70.8 kg)  02/25/20 157 lb (71.2 kg)   Temp Readings from Last 3 Encounters:  09/08/20 (!) 97.3 F (36.3 C)  08/08/20 98 F (36.7 C) (Oral)  07/01/20 (!) 97.2 F (36.2 C)   BP Readings from Last 3 Encounters:  09/08/20 (!) 111/54  08/08/20 130/77  07/01/20 134/70   Pulse Readings from Last 3 Encounters:  09/08/20 71  08/08/20 70  07/01/20 60      Well nourished, well developed female in no acute distress. Constitutional: Minimally conversant Most of the details provided by daughter  ASSESSMENT & PLAN:    Leah Hayden was seen today for other.  Diagnoses and all orders for this visit:  PAF (paroxysmal atrial fibrillation) ([place])  Atherosclerosis of native coronary artery of native heart with angina pectoris (HCC)  Sinus node dysfunction (HCC)  Sick sinus syndrome ([place])  Vascular dementia with behavior disturbance (HCC)     PAF (paroxysmal atrial fibrillation) ([place])- Plan: EKG 12-Lead Flecainide Previously held for sick sinus syndrome and symptomatic bradycardia Taking diltiazem 60 mg in the morning 30 in the afternoon and 30 before bed Low blood pressure today somewhat of a concern, family will monitor blood pressure late morning May need to decrease diltiazem down to 30 mg in the morning if blood pressure remains low  History of urinary tract infections Family reports increased somnolence past several days, low blood pressure today They will monitor closely and keep in touch with primary care if symptoms persist  SVT/ PSVT/ PAT Diltiazem as above, no indication of  symptoms concerning for tachyarrhythmia  Atherosclerosis of native coronary artery of native heart with angina pectoris [facility]) Previous cardiac catheterization with no significant disease No further work-up indicated  Memory loss:  Managed by primary care,  Family involved Moderatecognitive decline Lives at home with caretakers from [place] hypertension Low blood pressure today in the morning, will continue to monitor closely as above   COVID-19 Education: The signs and symptoms of COVID-19 were discussed with the patient and how to seek care for testing (follow up with PCP or arrange E-visit).  The importance of social distancing was discussed today.  Patient Risk:   After full review of this patients clinical status, I feel that they are at least moderate risk at this time.  Time:   Today, I have spent 25 minutes with the patient with telehealth technology discussing the cardiac and medical problems/diagnoses detailed above   10 min spent reviewing the chart prior to patient visit today   Medication Adjustments/Labs and Tests Ordered: Current medicines are reviewed at length with the patient today.  Concerns regarding medicines are outlined above.   Tests Ordered: No tests ordered   Medication Changes: No changes made   Signed, Ida Rogue, MD  09/09/2020 6:14 PM    [facility] [address] [place] [address], [place], [place] [address]

## 2020-09-08 NOTE — Patient Instructions (Addendum)
For continued somnolence,(increase sleeping and feeling of drowsiness) and low blood pressure, contact primary care.  May need UA/urine culture   Medication Instructions:  We may need to decrease the diltiazem in the morning down to 30 mg daily if BP is running low   If you need a refill on your cardiac medications before your next appointment, please call your pharmacy.    Lab work: No new labs needed  Testing/Procedures: No new testing needed   Follow-Up: At [facility], you and your health needs are our priority.  As part of our continuing mission to provide you with exceptional heart care, we have created designated Provider Care Teams.  These Care Teams include your primary Cardiologist (physician) and Advanced Practice Providers (APPs -  Physician Assistants and Nurse Practitioners) who all work together to provide you with the care you need, when you need it.  . You will need a follow up appointment in 6 months, APP ok  . Providers on your designated Care Team:   . Murray Hodgkins, NP . Christell Faith, PA-C . Marrianne Mood, PA-C  Any Other Special Instructions Will Be Listed Below (If Applicable).  COVID-19 Vaccine Information can be found at: ShippingScam.co.uk For questions related to vaccine distribution or appointments, please email vaccine@[email or web address] .[email or web address] or call [phone].   Please monitor blood pressures and keep a log of your readings.  Best to monitor blood pressure late morning  How to use a home blood pressure monitor. . Be still. . Measure at the same time every day. It's important to take the readings at the same time each day, such as morning and evening. Take reading approximately 1 1/2 to 2 hours after BP medications.   AVOID these things for 30 minutes before checking your blood pressure:  Drinking caffeine.  Drinking alcohol.  Eating.  Smoking.  Exercising.   Five minutes before  checking your blood pressure:  Pee.  Sit in a dining chair. Avoid sitting in a soft couch or armchair.  Be quiet. Do not talk.       Sit correctly. Sit with your back straight and supported (on a dining chair, rather than a sofa). Your feet should be flat on the floor and your legs should not be crossed. Your arm should be supported on a flat surface (such as a table) with the upper arm at heart level. Make sure the bottom of the cuff is placed directly above the bend of the elbow.

## 2020-09-16 ENCOUNTER — Other Ambulatory Visit: Payer: PPO | Admitting: Nurse Practitioner

## 2020-09-16 ENCOUNTER — Encounter: Payer: Self-pay | Admitting: Nurse Practitioner

## 2020-09-16 ENCOUNTER — Other Ambulatory Visit: Payer: Self-pay

## 2020-09-16 DIAGNOSIS — Z515 Encounter for palliative care: Secondary | ICD-10-CM

## 2020-09-16 DIAGNOSIS — F02818 Dementia in other diseases classified elsewhere, unspecified severity, with other behavioral disturbance: Secondary | ICD-10-CM

## 2020-09-16 DIAGNOSIS — F0281 Dementia in other diseases classified elsewhere with behavioral disturbance: Secondary | ICD-10-CM

## 2020-09-16 NOTE — Progress Notes (Signed)
[facility] Consult Note Telephone: [phone]  Fax: [phone]  PATIENT NAME: Leah Hayden DOB: [birth year] MRN: [id number]  PRIMARY CARE PROVIDER:   Venia Carbon, MD  REFERRING PROVIDER:  Venia Carbon, MD [address] [place],  [place] [address]   [place]; Son Maudie Mercury T[place]  1.Advance Care Planning;DNR; in [place]  2. Goals of Care: Goals include to maximize quality of life and symptom management. Our advance care planning conversation included a discussion about:   The value and importance of advance care planning  Exploration of personal, cultural or spiritual beliefs that might influence medical decisions  Exploration of goals of care in the event of a sudden injury or illness  Identification and preparation of a healthcare agent  Review and updating or creation of anadvance directive document.  3.Palliative care encounter; Palliative care encounter; Palliative medicine team will continue to support patient, patient's family, and medical team. Visit consisted of counseling and education dealing with the complex and emotionally intense issues of symptom management and palliative care in the setting of serious and potentially life-threatening illness  4. Agitation secondary to dementia, improved  5. F/u4 weeks for complex medical decision making  I spent 60 minutes providing this consultation,  from 12:30pm to 1:30pm. More than 50% of the time in this consultation was spent coordinating communication.   HISTORY OF PRESENT ILLNESS:  RABECCA Hayden is a 87 y.o. year old female with multiple medical problems including Dementia,Meniere's disease, myocardial infarction, proximal atrial fibrillation, pacemaker, sinus brady tachy syndrome cardioversion 2014, history of breast cancer s/p mastectomy, chronic kidney disease, but him and B12 deficiency, history of colon polyps, history  of diverticulosis, gerd, asthma, allergies, osteoarthritis, mood disorder with hallucinations, history of bicep tear on the right with surgical repair 2014, hammer toes with repair, partial hysterectomy, abdominal hysterectomy. I called Leah Hayden, Leah Hayden daughter-in-law to confirm Palliative care visit and covid screening which was negative. We talked about purpose of Palliative care visit, in agreement. I visited Leah Hayden with Leah Tawonda Legaspi present and caregiver Joycelyn Schmid. Leah Hayden was sitting on the couch in her living room. Leah Hayden did make eye contact, smile and was interactive when asked questions. Leah States did mumble works intermittently but was able to answer simple questions. Leah Hayden endorse is she had bacon and eggs for breakfast. We talked about symptoms of pain when she denies. We talked about Leah Apples gate as it does remain unsteady for which requires her to use the walker. Joycelyn Schmid endorses Leah Sorter does not always use her walker but she continues to encourage her. No recent falls, wounds, hospitalizations, infections. The family did have covid infection but it did not appear Leah Hayden had any symptoms. We talked about appetite which has been good. Leah Hayden does continue to eat her meals and no noted weight loss. Leah Hayden does on occasion get up during the night. Leah Hayden does have 24-hour caregivers as she is no longer able to stay by herself. Leah Hayden endorses the Seroquel she gets at bedtime seems to be helping keep her asleep. We talked about behaviors. Joycelyn Schmid endorses at times it is a little more difficult to redirect Leah. Hayden to where family does have to come up and help. Leah. Hayden endorses it is not consistent on a daily basis though maybe once every few weeks. Leah Hayden endorses some caregivers are able to get Leah Hayden to  do tasks more than others. We talked about chronic disease progression of dementia with realistic expectations. We talked about at present time to sample does  appear to be stable. Reviewed medical goals of care. We did talk about behaviors and if they find patterning worsening behaviors during the day may consider adding Seroquel in the morning all the time at present time the current regimen is effective. we talked about placement and currently Leah Hayden is on the waiting list for [facility]. Leah Hayden endorses they are planning for March for admission, it is taking some time to get her into a locked memory care unit. Leah. Hayden endorses the family feels like this is the best decision financially and for her care. Leah Hayden endorses when the weather is bad or someone gets sick the family has to supplement with caregiving and that is hard overall on everyone. We talked about role of Palliative care, plan of care. Discuss will follow up in one months if needed or soon as she declined. Leah. Hayden in agreement, appointment scheduled. Therapeutic listening, emotional support provided. Contact information. Questions answered is satisfaction.  Palliative Care was asked to help to continue to address goals of care.   CODE STATUS: DNR  PPS: 40% HOSPICE ELIGIBILITY/DIAGNOSIS: TBD  PAST MEDICAL HISTORY:  Past Medical History:  Diagnosis Date  . Allergic rhinitis   . Asthma   . Biceps tendon tear    a. right->s/p surgical repair winter of 2014.  . Diverticulosis of colon   . GERD (gastroesophageal reflux disease)   . Hammer toe of right foot   . Hx of colonic polyps   . HX: breast cancer   . Meniere's disease   . NSTEMI (non-ST elevated myocardial infarction) ([place])    a. 11/2012 in setting of rapid afib;  b. 11/2012 Cath: nl EF, nonobs CAD->Med Rx;  b. 11/2012 Echo: EF 60-65%, mild LVH, mild MR/TR.  . Osteoarthritis   . PAF (paroxysmal atrial fibrillation) ([place])    a. Eliquis and amio initiated 11/2012 in setting of admission with RVR and NSTEMI;  b. Subsequently changed to flecainide and xarelto;  c. 11/2013 Echo: EF 60-65%, mild LVH, mod MR/TR.  Marland Kitchen Sinus brady-tachy  syndrome ([facility])    a. Post-conversion pause of 7.2 seconds during 11/2012 hospitalization @ [facility]; b. 04/2017 Event Monitor: No significant arrhythmias.    SOCIAL HX:  Social History   Tobacco Use  . Smoking status: Never Smoker  . Smokeless tobacco: Never Used  Substance Use Topics  . Alcohol use: No    Alcohol/week: 0.0 standard drinks    ALLERGIES:  Allergies  Allergen Reactions  . Amiodarone Other (See Comments)    Chest discomfort  . Eliquis [Apixaban] Rash  . Other Rash    oramycin     PERTINENT MEDICATIONS:  Outpatient Encounter Medications as of 09/16/2020  Medication Sig  . albuterol (VENTOLIN HFA) 108 (90 Base) MCG/ACT inhaler Inhale 2 puffs into the lungs every 6 (six) hours as needed for wheezing or shortness of breath.    . cyanocobalamin 500 MCG tablet Take 500 mcg by mouth every other day.   . diltiazem (CARDIZEM) 30 MG tablet Take 2 tablets (60mg ) by mouth every morning, and 1 tablet (30mg ) by mouth in the afternoon and in the evening.  . hydroxypropyl methylcellulose / hypromellose (ISOPTO TEARS / GONIOVISC) 2.5 % ophthalmic solution Place 1 drop into both eyes 3 (three) times daily as needed for dry eyes.  Marland Kitchen loperamide (IMODIUM) 2 MG capsule Take  by mouth as needed for diarrhea or loose stools.  Marland Kitchen losartan (COZAAR) 25 MG tablet Take 1 tablet (25 mg total) by mouth daily.  . memantine (NAMENDA) 10 MG tablet Take 1 tablet (10 mg total) by mouth 2 (two) times daily.  . montelukast (SINGULAIR) 10 MG tablet Take 10 mg by mouth every evening.  . nitroGLYCERIN (NITROSTAT) 0.4 MG SL tablet Place 1 tablet (0.4 mg total) under the tongue every 5 (five) minutes as needed for chest pain.  Marland Kitchen QUEtiapine (SEROQUEL) 25 MG tablet TAKE 1 TABLET BY MOUTH EVERYDAY AT BEDTIME  . XARELTO 15 MG TABS tablet TAKE 1 TABLET (15 MG TOTAL) BY MOUTH DAILY WITH SUPPER.   No facility-administered encounter medications on file as of 09/16/2020.    PHYSICAL EXAM:   General: NAD, confused  female Cardiovascular: regular rate and rhythm Pulmonary: clear ant fields Neurological: unsteady gait; walks with walker  Randall Rampersad Ihor Gully, NP

## 2020-09-21 ENCOUNTER — Telehealth: Payer: Self-pay

## 2020-09-21 NOTE — Chronic Care Management (AMB) (Addendum)
Chronic Care Management Pharmacy Assistant   Name: Leah Hayden  MRN: [id number] DOB: May 15, 1928  Reason for Encounter: Disease State- General  Patient Question:  1.  Have you seen any other providers since your last visit? Yes  09/16/20- Palliative care 09/08/20- Dr. Ida Rogue- Cardiology-televisit 08/16/20- Palliative care  08/08/21- ED visit Acute cystitis without hematuria - Started course of Cephalexin 500 BID  PCP : Venia Carbon, MD  Allergies:   Allergies  Allergen Reactions   Amiodarone Other (See Comments)    Chest discomfort   Eliquis [Apixaban] Rash   Other Rash    oramycin    Medications: Outpatient Encounter Medications as of 09/21/2020  Medication Sig   albuterol (VENTOLIN HFA) 108 (90 Base) MCG/ACT inhaler Inhale 2 puffs into the lungs every 6 (six) hours as needed for wheezing or shortness of breath.     cyanocobalamin 500 MCG tablet Take 500 mcg by mouth every other day.    diltiazem (CARDIZEM) 30 MG tablet Take 2 tablets (60mg ) by mouth every morning, and 1 tablet (30mg ) by mouth in the afternoon and in the evening.   hydroxypropyl methylcellulose / hypromellose (ISOPTO TEARS / GONIOVISC) 2.5 % ophthalmic solution Place 1 drop into both eyes 3 (three) times daily as needed for dry eyes.   loperamide (IMODIUM) 2 MG capsule Take by mouth as needed for diarrhea or loose stools.   losartan (COZAAR) 25 MG tablet Take 1 tablet (25 mg total) by mouth daily.   memantine (NAMENDA) 10 MG tablet Take 1 tablet (10 mg total) by mouth 2 (two) times daily.   montelukast (SINGULAIR) 10 MG tablet Take 10 mg by mouth every evening.   nitroGLYCERIN (NITROSTAT) 0.4 MG SL tablet Place 1 tablet (0.4 mg total) under the tongue every 5 (five) minutes as needed for chest pain.   QUEtiapine (SEROQUEL) 25 MG tablet TAKE 1 TABLET BY MOUTH EVERYDAY AT BEDTIME   XARELTO 15 MG TABS tablet TAKE 1 TABLET (15 MG TOTAL) BY MOUTH DAILY WITH SUPPER.   No facility-administered encounter  medications on file as of 09/21/2020.    Current Diagnosis: Patient Active Problem List   Diagnosis Date Noted   Psychosis ([place]) 07/01/2020   Cystitis with hematuria 02/25/2020   Corn of toe 10/28/2019   Atherosclerosis of native coronary artery of native heart with angina pectoris ([place]) 09/03/2019   Confusion 07/25/2019   Hallucinations 07/25/2019   Pedal edema 07/25/2019   Abnormal urinalysis 07/25/2019   Sinus node dysfunction (HCC) 09/20/2018   Mood disorder ([place]) 06/26/2018   Stage 3b chronic kidney disease ([place]) 06/26/2018   Arrhythmia 04/12/2018   Vitamin B12 deficiency 11/10/2015   Vascular dementia with behavior disturbance ([facility]) 10/08/2015   Advance directive discussed with patient 06/07/2015   Hyperlipidemia 05/13/2014   PAF (paroxysmal atrial fibrillation) ([place])    Routine general medical examination at a health care facility 05/03/2012   SVT/ PSVT/ PAT 01/10/2010   Sick sinus syndrome ([place]) 01/10/2010   Generalized osteoarthrosis, involving multiple sites 12/18/2007   ALLERGIC RHINITIS 03/07/2007   Mild intermittent asthma in adult without complication 47/42/5956   GERD 03/07/2007   DIVERTICULOSIS, COLON 03/07/2007   BREAST CANCER, HX OF 03/07/2007   Patient unable to speak on telephone. Spoke with daughter in Sports coach, Leah Hayden.  Contacted [facility]  for general disease state call. Since last visit with CPP, no interventions have been made. The patient  has had an ED visit since last contact -08/08/21 for acute cystitis, was given  cephalexin 500 BID. The patient's current Chronic and PDC medications are: losartan 25 mg, takes 1 tablet daily. The patient reports the following problems with their health current UTI which daughter in law feels like is making her weaker. Leah Hayden denies  problems with her pharmacy. The patient denies side effects with her medications. she denies concerns or questions for [name], Pharm. D at this time.  Daughter in law, Leah Hayden, states they are  still working on getting patient placed in [facility]. Hopeful it will be this month. Leah Hayden notes that patient has been sleeping quite a bit and it is increasingly harder to get patient to drink any fluids. They do have home health coming out a few times a week to assist with patient.   Follow-Up:  Pharmacist Review  [name], CPP notified  Margaretmary Dys, [place] Assistant [phone]  I have reviewed the care management and care coordination activities outlined in this encounter and I am certifying that I agree with the content of this note. No further action required.  [name], PharmD Clinical Pharmacist [facility] [phone]

## 2020-09-22 DIAGNOSIS — N390 Urinary tract infection, site not specified: Secondary | ICD-10-CM | POA: Diagnosis not present

## 2020-09-26 LAB — CUP PACEART REMOTE DEVICE CHECK
Date Time Interrogation Session: 20220212072255
Implantable Lead Implant Date: 20200207
Implantable Lead Implant Date: 20200207
Implantable Lead Location: 753859
Implantable Lead Location: 753860
Implantable Lead Model: 5076
Implantable Pulse Generator Implant Date: 20200207
Pulse Gen Model: 407145
Pulse Gen Serial Number: 69503042

## 2020-09-27 DIAGNOSIS — Z87440 Personal history of urinary (tract) infections: Secondary | ICD-10-CM | POA: Diagnosis not present

## 2020-09-27 DIAGNOSIS — Z09 Encounter for follow-up examination after completed treatment for conditions other than malignant neoplasm: Secondary | ICD-10-CM | POA: Diagnosis not present

## 2020-09-28 ENCOUNTER — Ambulatory Visit (INDEPENDENT_AMBULATORY_CARE_PROVIDER_SITE_OTHER): Payer: PPO

## 2020-09-28 DIAGNOSIS — I495 Sick sinus syndrome: Secondary | ICD-10-CM

## 2020-09-29 ENCOUNTER — Telehealth: Payer: Self-pay | Admitting: Internal Medicine

## 2020-09-29 NOTE — Telephone Encounter (Signed)
Leah Hayden---please find out if there are any functional changes since last vist (continence, walking, personal care) and I can use the prior visit There will be a significant charge as the paperwork takes a long time

## 2020-09-29 NOTE — Telephone Encounter (Signed)
Spoke to [place]. She said her walking is worse at times. At times she says she can't but then she can. She is using a walker now. Has to have someone with her 24/7. She can brush her teeth, hair, and wash hands. Cannot bathe self. Cannot take care of own meds. Cannot cook her own food but can feed herself. She does wear a Depends. She does have some urinary incontinence. Did not realize she had a BM a few days ago. They need the paperwork so understand there is a fee. She is "living in her past". Seeing her husband outside a lot. Talking about her Mom in the present tense.

## 2020-09-29 NOTE — Telephone Encounter (Signed)
Dr.Letvak is requesting patient come in for an appointment to fill out paperwork for [facility]. I spoke to [place].  [name] said it's very difficult to get patient to the office.  She has problems with steps and she has to go down steps to leave the house.  [name] said patient saw Dr.Letvak in November because they were hoping to get in to [facility] then, but it didn't work out.  [name] was wondering if Dr.Letvak could use that visit for the forms.  [name] scheduled an appointment with Dr.Letvak on 10/06/20. She said if patient needs to come in to the office, she'll try to get help to bring patient in to the office. Forms for [facility] are on Dr.Letvak's desk.

## 2020-09-30 DIAGNOSIS — R197 Diarrhea, unspecified: Secondary | ICD-10-CM | POA: Diagnosis not present

## 2020-09-30 NOTE — Telephone Encounter (Signed)
Spoke to [place]. She said she has been saying her arm hurts. She is having some diarrhea and not eating well. They will do their best to bring her in. They will call early next week if there are any changes there prohibit her coming in.

## 2020-09-30 NOTE — Telephone Encounter (Signed)
She does have an appt 2/23---confirm they will be able to get her in

## 2020-10-04 NOTE — Progress Notes (Signed)
Remote pacemaker transmission.   

## 2020-10-06 ENCOUNTER — Ambulatory Visit: Payer: PPO | Admitting: Internal Medicine

## 2020-10-06 DIAGNOSIS — Z0279 Encounter for issue of other medical certificate: Secondary | ICD-10-CM

## 2020-10-06 DIAGNOSIS — R4182 Altered mental status, unspecified: Secondary | ICD-10-CM | POA: Diagnosis not present

## 2020-10-08 DIAGNOSIS — Z8659 Personal history of other mental and behavioral disorders: Secondary | ICD-10-CM | POA: Diagnosis not present

## 2020-10-08 DIAGNOSIS — Z09 Encounter for follow-up examination after completed treatment for conditions other than malignant neoplasm: Secondary | ICD-10-CM | POA: Diagnosis not present

## 2020-10-08 NOTE — Telephone Encounter (Signed)
Leah Hayden called in from [facility] stating she is needing to have the FL2 form corrected. Stated the diagnosis was dementia but was placed in assisted living. Needing to be changed to memory care unit. Please advise and fax back.

## 2020-10-09 NOTE — Telephone Encounter (Signed)
They didn't tell me where they were putting her ---or the official designation. I can addend it on Monday

## 2020-10-11 ENCOUNTER — Other Ambulatory Visit: Payer: Self-pay | Admitting: Internal Medicine

## 2020-10-11 NOTE — Telephone Encounter (Signed)
The original has not been scanned in. But, I did save the form on my computer. I have printed it out and placed it in Dr Alla German Inbox on his desk.

## 2020-10-11 NOTE — Telephone Encounter (Signed)
Spoke to [place]. Got fax number to send updated FL2. Faxed to [phone].

## 2020-10-11 NOTE — Telephone Encounter (Signed)
Leah Hayden, Wasn't that all sent last week (2/23) was the signature date. It is already scanned in too!  [name]

## 2020-10-11 NOTE — Telephone Encounter (Signed)
They are trying to move her in this morning

## 2020-10-11 NOTE — Telephone Encounter (Signed)
[facility] Night - Client Nonclinical Telephone Record  [facility] Client [facility] Night - Client Client Site [place] Physician Viviana Simpler- MD Contact Type Call Who Is Calling Patient / Member / Family / Caregiver Caller Name Danamarie Minami Caller Phone Number [phone] Patient Name [place] Patient DOB [birth year] Call Type Message Only Information Provided Reason for Call Request for General Office Information Initial Comment Caller states she is checking on a form from the doctor. Additional Comment A message was sent and office hours were given. Disp. Time Disposition Final User 10/09/2020 11:06:34 AM General Information Provided Yes Shann Medal Call Closed By: Shann Medal Transaction Date/Time: 10/09/2020 11:03:50 AM (ET)

## 2020-10-11 NOTE — Telephone Encounter (Signed)
Patients [name] is requesting a call when the [facility] is signed by Dr. [name].

## 2020-10-12 ENCOUNTER — Telehealth: Payer: Self-pay | Admitting: *Deleted

## 2020-10-12 DIAGNOSIS — I129 Hypertensive chronic kidney disease with stage 1 through stage 4 chronic kidney disease, or unspecified chronic kidney disease: Secondary | ICD-10-CM | POA: Diagnosis not present

## 2020-10-12 DIAGNOSIS — R600 Localized edema: Secondary | ICD-10-CM | POA: Diagnosis not present

## 2020-10-12 DIAGNOSIS — E538 Deficiency of other specified B group vitamins: Secondary | ICD-10-CM | POA: Diagnosis not present

## 2020-10-12 DIAGNOSIS — N189 Chronic kidney disease, unspecified: Secondary | ICD-10-CM | POA: Diagnosis not present

## 2020-10-12 DIAGNOSIS — Z79899 Other long term (current) drug therapy: Secondary | ICD-10-CM | POA: Diagnosis not present

## 2020-10-12 DIAGNOSIS — F0150 Vascular dementia without behavioral disturbance: Secondary | ICD-10-CM | POA: Diagnosis not present

## 2020-10-12 NOTE — Telephone Encounter (Signed)
Yes--that would be good. But she should know that the plan is to have her switch care to [facility]

## 2020-10-12 NOTE — Telephone Encounter (Signed)
Leah Hayden with Leah Hayden left a voicemail stating that the patient is going from her home to [place]. Leah Hayden is wanting to know if Dr. [name] is okay with them continuing palliative care for patient .Please call Leah Hayden back at [phone] opt 2

## 2020-10-12 NOTE — Telephone Encounter (Signed)
Spoke to [place]. She made a note about [facility].

## 2020-10-14 DIAGNOSIS — M79605 Pain in left leg: Secondary | ICD-10-CM | POA: Diagnosis not present

## 2020-10-14 DIAGNOSIS — R2242 Localized swelling, mass and lump, left lower limb: Secondary | ICD-10-CM | POA: Diagnosis not present

## 2020-10-15 ENCOUNTER — Encounter: Payer: PPO | Admitting: Internal Medicine

## 2020-10-15 DIAGNOSIS — F0150 Vascular dementia without behavioral disturbance: Secondary | ICD-10-CM | POA: Diagnosis not present

## 2020-10-15 DIAGNOSIS — E538 Deficiency of other specified B group vitamins: Secondary | ICD-10-CM | POA: Diagnosis not present

## 2020-10-15 DIAGNOSIS — N189 Chronic kidney disease, unspecified: Secondary | ICD-10-CM | POA: Diagnosis not present

## 2020-10-15 DIAGNOSIS — R600 Localized edema: Secondary | ICD-10-CM | POA: Diagnosis not present

## 2020-10-15 DIAGNOSIS — Z79899 Other long term (current) drug therapy: Secondary | ICD-10-CM | POA: Diagnosis not present

## 2020-10-19 ENCOUNTER — Telehealth: Payer: PPO

## 2020-10-25 DIAGNOSIS — Z79899 Other long term (current) drug therapy: Secondary | ICD-10-CM | POA: Diagnosis not present

## 2020-10-25 DIAGNOSIS — N390 Urinary tract infection, site not specified: Secondary | ICD-10-CM | POA: Diagnosis not present

## 2020-10-26 DIAGNOSIS — R609 Edema, unspecified: Secondary | ICD-10-CM | POA: Diagnosis not present

## 2020-10-26 DIAGNOSIS — R4182 Altered mental status, unspecified: Secondary | ICD-10-CM | POA: Diagnosis not present

## 2020-10-26 DIAGNOSIS — W19XXXA Unspecified fall, initial encounter: Secondary | ICD-10-CM | POA: Diagnosis not present

## 2020-10-26 DIAGNOSIS — E7439 Other disorders of intestinal carbohydrate absorption: Secondary | ICD-10-CM | POA: Diagnosis not present

## 2020-10-31 DIAGNOSIS — E538 Deficiency of other specified B group vitamins: Secondary | ICD-10-CM | POA: Diagnosis not present

## 2020-10-31 DIAGNOSIS — N183 Chronic kidney disease, stage 3 unspecified: Secondary | ICD-10-CM | POA: Diagnosis not present

## 2020-10-31 DIAGNOSIS — I4891 Unspecified atrial fibrillation: Secondary | ICD-10-CM | POA: Diagnosis not present

## 2020-10-31 DIAGNOSIS — Z79899 Other long term (current) drug therapy: Secondary | ICD-10-CM | POA: Diagnosis not present

## 2020-10-31 DIAGNOSIS — Z87440 Personal history of urinary (tract) infections: Secondary | ICD-10-CM | POA: Diagnosis not present

## 2020-10-31 DIAGNOSIS — I129 Hypertensive chronic kidney disease with stage 1 through stage 4 chronic kidney disease, or unspecified chronic kidney disease: Secondary | ICD-10-CM | POA: Diagnosis not present

## 2020-10-31 DIAGNOSIS — F0150 Vascular dementia without behavioral disturbance: Secondary | ICD-10-CM | POA: Diagnosis not present

## 2020-10-31 DIAGNOSIS — I2510 Atherosclerotic heart disease of native coronary artery without angina pectoris: Secondary | ICD-10-CM | POA: Diagnosis not present

## 2020-10-31 DIAGNOSIS — Z9181 History of falling: Secondary | ICD-10-CM | POA: Diagnosis not present

## 2020-10-31 DIAGNOSIS — Z7901 Long term (current) use of anticoagulants: Secondary | ICD-10-CM | POA: Diagnosis not present

## 2020-10-31 DIAGNOSIS — J45909 Unspecified asthma, uncomplicated: Secondary | ICD-10-CM | POA: Diagnosis not present

## 2020-11-01 ENCOUNTER — Other Ambulatory Visit: Payer: Self-pay

## 2020-11-01 ENCOUNTER — Other Ambulatory Visit: Payer: PPO | Admitting: Nurse Practitioner

## 2020-11-02 ENCOUNTER — Telehealth: Payer: Self-pay

## 2020-11-02 ENCOUNTER — Other Ambulatory Visit: Payer: Self-pay | Admitting: Internal Medicine

## 2020-11-02 DIAGNOSIS — F0150 Vascular dementia without behavioral disturbance: Secondary | ICD-10-CM | POA: Diagnosis not present

## 2020-11-02 DIAGNOSIS — I10 Essential (primary) hypertension: Secondary | ICD-10-CM | POA: Diagnosis not present

## 2020-11-02 DIAGNOSIS — N390 Urinary tract infection, site not specified: Secondary | ICD-10-CM | POA: Diagnosis not present

## 2020-11-02 DIAGNOSIS — W19XXXA Unspecified fall, initial encounter: Secondary | ICD-10-CM | POA: Diagnosis not present

## 2020-11-02 NOTE — Telephone Encounter (Signed)
Spoke to [place] (DRP) states patient is now at [facility]. States they will get the box sent to the facility. Advised to call with further questions or concerns.

## 2020-11-04 NOTE — Telephone Encounter (Signed)
Monitor connecting as of 11/03/2020

## 2020-11-05 ENCOUNTER — Non-Acute Institutional Stay: Payer: PPO | Admitting: Adult Health Nurse Practitioner

## 2020-11-05 ENCOUNTER — Other Ambulatory Visit: Payer: Self-pay

## 2020-11-05 DIAGNOSIS — Z515 Encounter for palliative care: Secondary | ICD-10-CM

## 2020-11-05 DIAGNOSIS — F0151 Vascular dementia with behavioral disturbance: Secondary | ICD-10-CM

## 2020-11-05 DIAGNOSIS — R5381 Other malaise: Secondary | ICD-10-CM

## 2020-11-05 DIAGNOSIS — F01518 Vascular dementia, unspecified severity, with other behavioral disturbance: Secondary | ICD-10-CM

## 2020-11-05 NOTE — Addendum Note (Signed)
Addended by: Hanley Ben K on: 11/05/2020 11:34 AM   Modules accepted: Level of Service

## 2020-11-05 NOTE — Progress Notes (Addendum)
[employer] Palliative Care Consult Note Telephone: [phone]  Fax: [phone]  PATIENT NAME: Leah Hayden DOB: Feb 20, 1928 MRN: [id number]  PRIMARY CARE PROVIDER:   Dr. Durwin Reges, [facility]  REFERRING PROVIDER:  Dr. Durwin Reges, [facility]  RESPONSIBLE PARTY:   Quaneshia Wareing, daughter in law H: [phone]  C: [place], son/POA [phone] Matalynn Graff, son [phone]  Chief complaint:  Follow up palliative visit/debility    RECOMMENDATIONS and PLAN:  1.  Advanced care planning.  Patient is DNR  2. Vascular dementia/debility.  Patient may be having decline related to transition from home to ALF.PT does not seem to be beneficial given her dementia.  She is needing a higher level of care and have discussed with daughter in law.  Family has already started looking into skilled nursing facilities. Will put in SW referral to help family with this transition.  Discussed with daughter in law that I will continue to see her while she is at ALF and that we will get new referral once she transitions to SNF to continue palliative services and she is in agreement with this.   I spent 50 minutes providing this consultation, including time spent with patient/family, provider coordination, chart review, documentation. More than 50% of the time in this consultation was spent coordinating communication.   HISTORY OF PRESENT ILLNESS:  Leah Hayden is a 87 y.o. year old female with multiple medical problems including Dementia,Meniere's disease, myocardial infarction, proximal atrial fibrillation, pacemaker, sinus brady tachy syndrome cardioversion 2014, history of breast cancer s/p mastectomy, chronic kidney disease, but him and B12 deficiency, history of colon polyps, history of diverticulosis, gerd, asthma, allergies, osteoarthritis, mood disorder with hallucinations, history of bicep tear on the right with surgical repair 2014, hammer toes with repair, partial  hysterectomy, abdominal hysterectomy. Palliative Care was asked to help address goals of care. Reviewed patient's EMR including most recent labs and imaging. Patient has been resident at [facility] for almost a month now.  Patient unable to contribute to HPI/ROS due to dementia.  Obtained HPI/ROS from staff and daughter in law.  Staff have concerns that she is requiring more assistance than can be provided at ALF.  She is able some days to get up and ambulate with walker but staff have noticed that her legs do buckle and she has had 3 falls since being at facility. Has been working with PT. She has recently been treated for UTI.  Staff do report that most days she is requiring 2 person assist with transfers and ADLs.  Some days she needs someone to help feed her.  She has had a weight loss of 9 pounds over the past month.  Upon admission she was 165.6 pounds and now weighs 156 pounds.  Daughter in law states that she was gaining weight prior to her admission. She also states that prior to admission she was having half and half good days and bad days.  On bad days she did require more help and would sit more and sleep more often.    CODE STATUS: DNR  PPS: 40-30% HOSPICE ELIGIBILITY/DIAGNOSIS: TBD  PHYSICAL EXAM:  HR 63 O2 90% on RA General: NAD, frail appearing Eyes: sclera anicteric and noninjected with no discharge noted ENMT: moist mucous membranes Cardiovascular: regular rate and rhythm Pulmonary: lung sounds clear; normal respiratory effort Abdomen: soft, nontender, + bowel sounds Extremities: no edema, no joint deformities Skin: no rashes on exposed skin Neurological: Weakness; A&O to person only  PAST  MEDICAL HISTORY:  Past Medical History:  Diagnosis Date  . Allergic rhinitis   . Asthma   . Biceps tendon tear    a. right->s/p surgical repair winter of 2014.  . Diverticulosis of colon   . GERD (gastroesophageal reflux disease)   . Hammer toe of right foot   . Hx of  colonic polyps   . HX: breast cancer   . Meniere's disease   . NSTEMI (non-ST elevated myocardial infarction) ([place])    a. 11/2012 in setting of rapid afib;  b. 11/2012 Cath: nl EF, nonobs CAD->Med Rx;  b. 11/2012 Echo: EF 60-65%, mild LVH, mild MR/TR.  . Osteoarthritis   . PAF (paroxysmal atrial fibrillation) ([place])    a. Eliquis and amio initiated 11/2012 in setting of admission with RVR and NSTEMI;  b. Subsequently changed to flecainide and xarelto;  c. 11/2013 Echo: EF 60-65%, mild LVH, mod MR/TR.  Marland Kitchen Sinus brady-tachy syndrome ([place])    a. Post-conversion pause of 7.2 seconds during 11/2012 hospitalization @ [facility]; b. 04/2017 Event Monitor: No significant arrhythmias.    SOCIAL HX:  Social History   Tobacco Use  . Smoking status: Never Smoker  . Smokeless tobacco: Never Used  Substance Use Topics  . Alcohol use: No    Alcohol/week: 0.0 standard drinks    ALLERGIES:  Allergies  Allergen Reactions  . Amiodarone Other (See Comments)    Chest discomfort  . Eliquis [Apixaban] Rash  . Other Rash    oramycin     PERTINENT MEDICATIONS:  Outpatient Encounter Medications as of 11/05/2020  Medication Sig  . albuterol (VENTOLIN HFA) 108 (90 Base) MCG/ACT inhaler Inhale 2 puffs into the lungs every 6 (six) hours as needed for wheezing or shortness of breath.    . cyanocobalamin 500 MCG tablet Take 500 mcg by mouth every other day.   . diltiazem (CARDIZEM) 30 MG tablet Take 2 tablets (60mg ) by mouth every morning, and 1 tablet (30mg ) by mouth in the afternoon and in the evening.  . hydroxypropyl methylcellulose / hypromellose (ISOPTO TEARS / GONIOVISC) 2.5 % ophthalmic solution Place 1 drop into both eyes 3 (three) times daily as needed for dry eyes.  Marland Kitchen loperamide (IMODIUM) 2 MG capsule Take by mouth as needed for diarrhea or loose stools.  Marland Kitchen losartan (COZAAR) 25 MG tablet Take 1 tablet (25 mg total) by mouth daily.  . memantine (NAMENDA) 10 MG tablet Take 1 tablet (10 mg total) by mouth 2 (two)  times daily.  . montelukast (SINGULAIR) 10 MG tablet Take 10 mg by mouth every evening.  . nitroGLYCERIN (NITROSTAT) 0.4 MG SL tablet Place 1 tablet (0.4 mg total) under the tongue every 5 (five) minutes as needed for chest pain.  Marland Kitchen QUEtiapine (SEROQUEL) 25 MG tablet TAKE 1 TABLET BY MOUTH EVERYDAY AT BEDTIME  . XARELTO 15 MG TABS tablet TAKE 1 TABLET (15 MG TOTAL) BY MOUTH DAILY WITH SUPPER.   No facility-administered encounter medications on file as of 11/05/2020.      Lilianna Case Jenetta Downer, NP

## 2020-11-08 ENCOUNTER — Telehealth: Payer: Self-pay

## 2020-11-08 NOTE — Telephone Encounter (Signed)
11/08/20 @230PM : Palliative care SW outreached patients family - Shaylee Stanislawski, per NP request to discuss LTC planning and placement options.  [name] shared that patient is currently at [facility]F and is being told by their staff that patient is requiring more assistance than they can offer and recommending SNF placement. DIL shared that they would like to keep patient in [facility] if possible and they have looked into [facility], [facility] and [facility].   SW also suggested  [facility], [facility] as options.   SW discussed LTC medicaid process and guidelines once patient is placed in a SNF.   Family had no other concerns for SW at this time. SW provided contact information for future reference.   SW updated palliative care NP of patient/family contact.

## 2020-11-09 DIAGNOSIS — I4892 Unspecified atrial flutter: Secondary | ICD-10-CM | POA: Diagnosis not present

## 2020-11-09 DIAGNOSIS — Z87448 Personal history of other diseases of urinary system: Secondary | ICD-10-CM | POA: Diagnosis not present

## 2020-11-09 DIAGNOSIS — R600 Localized edema: Secondary | ICD-10-CM | POA: Diagnosis not present

## 2020-11-09 DIAGNOSIS — J45998 Other asthma: Secondary | ICD-10-CM | POA: Diagnosis not present

## 2020-11-10 DIAGNOSIS — N189 Chronic kidney disease, unspecified: Secondary | ICD-10-CM | POA: Diagnosis not present

## 2020-11-10 DIAGNOSIS — F0150 Vascular dementia without behavioral disturbance: Secondary | ICD-10-CM | POA: Diagnosis not present

## 2020-11-10 DIAGNOSIS — I129 Hypertensive chronic kidney disease with stage 1 through stage 4 chronic kidney disease, or unspecified chronic kidney disease: Secondary | ICD-10-CM | POA: Diagnosis not present

## 2020-11-10 DIAGNOSIS — I4892 Unspecified atrial flutter: Secondary | ICD-10-CM | POA: Diagnosis not present

## 2020-11-11 DIAGNOSIS — I129 Hypertensive chronic kidney disease with stage 1 through stage 4 chronic kidney disease, or unspecified chronic kidney disease: Secondary | ICD-10-CM | POA: Diagnosis not present

## 2020-11-11 DIAGNOSIS — I4891 Unspecified atrial fibrillation: Secondary | ICD-10-CM | POA: Diagnosis not present

## 2020-11-11 DIAGNOSIS — N183 Chronic kidney disease, stage 3 unspecified: Secondary | ICD-10-CM | POA: Diagnosis not present

## 2020-11-15 ENCOUNTER — Telehealth: Payer: Self-pay

## 2020-11-15 ENCOUNTER — Emergency Department: Payer: PPO

## 2020-11-15 ENCOUNTER — Encounter: Payer: Self-pay | Admitting: Emergency Medicine

## 2020-11-15 ENCOUNTER — Emergency Department
Admission: EM | Admit: 2020-11-15 | Discharge: 2020-11-16 | Disposition: A | Discharge status: Home / Self Care | Payer: PPO | Attending: Emergency Medicine | Admitting: Emergency Medicine

## 2020-11-15 DIAGNOSIS — Z79899 Other long term (current) drug therapy: Secondary | ICD-10-CM | POA: Insufficient documentation

## 2020-11-15 DIAGNOSIS — S0990XA Unspecified injury of head, initial encounter: Secondary | ICD-10-CM | POA: Diagnosis not present

## 2020-11-15 DIAGNOSIS — E538 Deficiency of other specified B group vitamins: Secondary | ICD-10-CM | POA: Diagnosis not present

## 2020-11-15 DIAGNOSIS — Z7901 Long term (current) use of anticoagulants: Secondary | ICD-10-CM | POA: Diagnosis not present

## 2020-11-15 DIAGNOSIS — F0390 Unspecified dementia without behavioral disturbance: Secondary | ICD-10-CM | POA: Diagnosis not present

## 2020-11-15 DIAGNOSIS — Y92129 Unspecified place in nursing home as the place of occurrence of the external cause: Secondary | ICD-10-CM | POA: Insufficient documentation

## 2020-11-15 DIAGNOSIS — J45909 Unspecified asthma, uncomplicated: Secondary | ICD-10-CM | POA: Diagnosis not present

## 2020-11-15 DIAGNOSIS — Z87440 Personal history of urinary (tract) infections: Secondary | ICD-10-CM | POA: Diagnosis not present

## 2020-11-15 DIAGNOSIS — N1832 Chronic kidney disease, stage 3b: Secondary | ICD-10-CM | POA: Insufficient documentation

## 2020-11-15 DIAGNOSIS — I129 Hypertensive chronic kidney disease with stage 1 through stage 4 chronic kidney disease, or unspecified chronic kidney disease: Secondary | ICD-10-CM | POA: Diagnosis not present

## 2020-11-15 DIAGNOSIS — Z9181 History of falling: Secondary | ICD-10-CM | POA: Diagnosis not present

## 2020-11-15 DIAGNOSIS — Z950 Presence of cardiac pacemaker: Secondary | ICD-10-CM | POA: Insufficient documentation

## 2020-11-15 DIAGNOSIS — W19XXXA Unspecified fall, initial encounter: Secondary | ICD-10-CM | POA: Diagnosis not present

## 2020-11-15 DIAGNOSIS — J4520 Mild intermittent asthma, uncomplicated: Secondary | ICD-10-CM | POA: Diagnosis not present

## 2020-11-15 DIAGNOSIS — F0150 Vascular dementia without behavioral disturbance: Secondary | ICD-10-CM | POA: Diagnosis not present

## 2020-11-15 DIAGNOSIS — N183 Chronic kidney disease, stage 3 unspecified: Secondary | ICD-10-CM | POA: Diagnosis not present

## 2020-11-15 DIAGNOSIS — Z043 Encounter for examination and observation following other accident: Secondary | ICD-10-CM | POA: Diagnosis not present

## 2020-11-15 DIAGNOSIS — I480 Paroxysmal atrial fibrillation: Secondary | ICD-10-CM | POA: Diagnosis not present

## 2020-11-15 DIAGNOSIS — Z853 Personal history of malignant neoplasm of breast: Secondary | ICD-10-CM | POA: Insufficient documentation

## 2020-11-15 DIAGNOSIS — S0081XA Abrasion of other part of head, initial encounter: Secondary | ICD-10-CM | POA: Insufficient documentation

## 2020-11-15 DIAGNOSIS — R5381 Other malaise: Secondary | ICD-10-CM | POA: Diagnosis not present

## 2020-11-15 DIAGNOSIS — I2510 Atherosclerotic heart disease of native coronary artery without angina pectoris: Secondary | ICD-10-CM | POA: Diagnosis not present

## 2020-11-15 DIAGNOSIS — I4891 Unspecified atrial fibrillation: Secondary | ICD-10-CM | POA: Diagnosis not present

## 2020-11-15 NOTE — ED Triage Notes (Signed)
Pt arrived via [facility] from [facility] where she had a unwitnessed fall. Staff reported pt awake and alert when found with abrasion noted to the forehead. Abrasion and bruising noted to the left side of forehead as well as the right wrist area. No blood thinners reported.  Pt with pacemaker as well as double mastectomy noted on assessment. Pt not able to answer question on time, place, situation or self.

## 2020-11-15 NOTE — Telephone Encounter (Signed)
11/15/20 @11 :45AM: Palliative care SW outreached [facility], returning call to Gweneth Fritter.   Call unsuccessful. SW LVM. Awaiting return call.

## 2020-11-15 NOTE — ED Provider Notes (Signed)
  Physical Exam  BP (!) 114/55 (BP Location: Right Arm)   Pulse 77   Temp 98.4 F (36.9 C) (Oral)   Resp 18   SpO2 97%   Physical Exam  ED Course/Procedures     Procedures  MDM  11:50 PM  Assumed care.  Patient is a 87 year old female presenting from a memory care unit after she had an unwitnessed fall.  CT head and cervical spine pending.  If negative, will discharge back to her facility.  12:20 AM  Pt's CT showed no acute injury.  Will discharge back to her facility.  Son at bedside has been updated and is comfortable with this plan.  No complaints, concerns at this time.   At this time, I do not feel there is any life-threatening condition present. I have reviewed, interpreted and discussed all results (EKG, imaging, lab, urine as appropriate) and exam findings with patient/family. I have reviewed nursing notes and appropriate previous records.  I feel the patient is safe to be discharged home without further emergent workup and can continue workup as an outpatient as needed. Discussed usual and customary return precautions. Patient/family verbalize understanding and are comfortable with this plan.  Outpatient follow-up has been provided as needed. All questions have been answered.        Latima Hamza, Delice Bison, DO 11/16/20 [phone]

## 2020-11-15 NOTE — ED Provider Notes (Signed)
[facility] Emergency Department Provider Note  Time seen: 10:03 PM  I have reviewed the triage vital signs and the nursing notes.   HISTORY  Chief Complaint Fall   HPI Leah Hayden is a 87 y.o. female with a past medical history of gastric reflux, paroxysmal atrial fibrillation, dementia, presents emergency department after a fall.  According to EMS report patient was found down at her nursing facility on her floor.  History of dementia unwitnessed fall.  Patient is unable to contribute to her history or review of systems.  Patient does have an abrasion to her left forehead as her only traumatic finding.   Past Medical History:  Diagnosis Date  . Allergic rhinitis   . Asthma   . Biceps tendon tear    a. right->s/p surgical repair winter of 2014.  . Diverticulosis of colon   . GERD (gastroesophageal reflux disease)   . Hammer toe of right foot   . Hx of colonic polyps   . HX: breast cancer   . Meniere's disease   . NSTEMI (non-ST elevated myocardial infarction) ([place])    a. 11/2012 in setting of rapid afib;  b. 11/2012 Cath: nl EF, nonobs CAD->Med Rx;  b. 11/2012 Echo: EF 60-65%, mild LVH, mild MR/TR.  . Osteoarthritis   . PAF (paroxysmal atrial fibrillation) ([place])    a. Eliquis and amio initiated 11/2012 in setting of admission with RVR and NSTEMI;  b. Subsequently changed to flecainide and xarelto;  c. 11/2013 Echo: EF 60-65%, mild LVH, mod MR/TR.  Marland Kitchen Sinus brady-tachy syndrome ([place])    a. Post-conversion pause of 7.2 seconds during 11/2012 hospitalization @ [facility]; b. 04/2017 Event Monitor: No significant arrhythmias.    Patient Active Problem List   Diagnosis Date Noted  . Psychosis ([place]) 07/01/2020  . Cystitis with hematuria 02/25/2020  . Corn of toe 10/28/2019  . Atherosclerosis of native coronary artery of native heart with angina pectoris ([place]) 09/03/2019  . Confusion 07/25/2019  . Hallucinations 07/25/2019  . Pedal edema 07/25/2019  . Abnormal  urinalysis 07/25/2019  . Sinus node dysfunction ([place]) 09/20/2018  . Mood disorder ([place]) 06/26/2018  . Stage 3b chronic kidney disease ([place]) 06/26/2018  . Arrhythmia 04/12/2018  . Vitamin B12 deficiency 11/10/2015  . Vascular dementia with behavior disturbance ([place]) 10/08/2015  . Advance directive discussed with patient 06/07/2015  . Hyperlipidemia 05/13/2014  . PAF (paroxysmal atrial fibrillation) ([place])   . Routine general medical examination at a health care facility 05/03/2012  . SVT/ PSVT/ PAT 01/10/2010  . Sick sinus syndrome ([place]) 01/10/2010  . Generalized osteoarthrosis, involving multiple sites 12/18/2007  . ALLERGIC RHINITIS 03/07/2007  . Mild intermittent asthma in adult without complication 59/56/3875  . GERD 03/07/2007  . DIVERTICULOSIS, COLON 03/07/2007  . BREAST CANCER, HX OF 03/07/2007    Past Surgical History:  Procedure Laterality Date  . ABDOMINAL HYSTERECTOMY    . CARDIAC CATHETERIZATION  11/2013   [facility]  . [place]   2nd to left toe  . ESOPHAGOGASTRODUODENOSCOPY  10/1999   inflammation only   . FOREIGN BODY REMOVAL Right 01/02/2013   Procedure: FOREIGN BODY REMOVAL RIGHT INDEX FINGER;  Surgeon: Cammie Sickle., MD;  Location: [facility];  Service: Orthopedics;  Laterality: Right;  . HAMMER TOE SURGERY  11/15   Dr Milinda Pointer  . MASTECTOMY  1984   left   . MASTECTOMY  1985   right  . PACEMAKER IMPLANT N/A 09/20/2018   Procedure:  PACEMAKER IMPLANT;  Surgeon: Deboraha Sprang, MD;  Location: [facility];  Service: Cardiovascular;  Laterality: N/A;  . PARTIAL HYSTERECTOMY  1966   w/ bladder tack  . ROTATOR CUFF REPAIR  10/09   right   . TOE SURGERY  2001/2002   2nd/3rd,left     Prior to Admission medications   Medication Sig Start Date End Date Taking? Authorizing Provider  albuterol (VENTOLIN HFA) 108 (90 Base) MCG/ACT inhaler Inhale 2 puffs into the lungs every 6 (six) hours as needed for wheezing or shortness of  breath.      [provider]  cyanocobalamin 500 MCG tablet Take 500 mcg by mouth every other day.     [provider]  diltiazem (CARDIZEM) 30 MG tablet Take 2 tablets (60mg ) by mouth every morning, and 1 tablet (30mg ) by mouth in the afternoon and in the evening. 02/04/20   Marrianne Mood D, PA-C  hydroxypropyl methylcellulose / hypromellose (ISOPTO TEARS / GONIOVISC) 2.5 % ophthalmic solution Place 1 drop into both eyes 3 (three) times daily as needed for dry eyes.    [provider]  loperamide (IMODIUM) 2 MG capsule Take by mouth as needed for diarrhea or loose stools.    [provider]  losartan (COZAAR) 25 MG tablet Take 1 tablet (25 mg total) by mouth daily. 02/09/20   Deboraha Sprang, MD  memantine (NAMENDA) 10 MG tablet Take 1 tablet (10 mg total) by mouth 2 (two) times daily. 07/01/20   Venia Carbon, MD  montelukast (SINGULAIR) 10 MG tablet Take 10 mg by mouth every evening.    [provider]  nitroGLYCERIN (NITROSTAT) 0.4 MG SL tablet Place 1 tablet (0.4 mg total) under the tongue every 5 (five) minutes as needed for chest pain. 02/22/18   Minna Merritts, MD  QUEtiapine (SEROQUEL) 25 MG tablet TAKE 1 TABLET BY MOUTH EVERYDAY AT BEDTIME 07/19/20   Viviana Simpler I, MD  XARELTO 15 MG TABS tablet TAKE 1 TABLET (15 MG TOTAL) BY MOUTH DAILY WITH SUPPER. 03/01/20   Gollan, Kathlene November, MD    Allergies  Allergen Reactions  . Amiodarone Other (See Comments)    Chest discomfort  . Eliquis [Apixaban] Rash  . Other Rash    oramycin    Family History  Problem Relation Age of Onset  . Heart failure Mother   . Gout Mother   . Heart disease Mother        heart failure  . Arthritis Mother   . Heart failure Father   . Heart disease Father        heart failure  . Diabetes Maternal Grandmother   . Colon cancer Maternal Grandfather   . Cancer Maternal Grandfather        colon  . Arthritis Brother     Social History Social History    Tobacco Use  . Smoking status: Never Smoker  . Smokeless tobacco: Never Used  Vaping Use  . Vaping Use: Never used  Substance Use Topics  . Alcohol use: No    Alcohol/week: 0.0 standard drinks  . Drug use: No    Review of Systems Unable to obtain adequate/accurate review of systems secondary to baseline dementia. ____________________________________________   PHYSICAL EXAM:  Constitutional: Patient is awake and alert, no distress.  Confused/disoriented but at baseline per report. Eyes: Normal exam ENT      Head: Small abrasion to left forehead.  No hematoma.  No lacerations.  Mouth/Throat: Mucous membranes are moist. Cardiovascular: Normal rate, regular rhythm.  Respiratory: Normal respiratory effort without tachypnea nor retractions. Breath sounds are clear.  Pacemaker present. Gastrointestinal: Soft and nontender. No distention.   Musculoskeletal: Nontender with normal range of motion in all extremities. No lower extremity tenderness or edema. Neurologic:  Normal speech and language. No gross focal neurologic deficits  Skin:  Skin is warm, dry and intact.  Psychiatric: Mood and affect are normal.   ____________________________________________    EKG  EKG viewed and interpreted by myself shows sinus rhythm at 76 bpm with a narrow QRS, left axis deviation, largely normal intervals with nonspecific ST changes.  ____________________________________________    RADIOLOGY  CT scans pending  ____________________________________________   INITIAL IMPRESSION / ASSESSMENT AND PLAN / ED COURSE  Pertinent labs & imaging results that were available during my care of the patient were reviewed by me and considered in my medical decision making (see chart for details).   Patient presents to the emergency department after an unwitnessed fall at her nursing facility.  History of dementia and cannot contribute to her history.  Overall the patient appears well, no distress  denies any pain.  Great range of motion in all extremities without any pain elicited.  Patient does have an abrasion to her left forehead.  As a precaution we will obtain CT images of the head and C-spine.  We will continue to closely monitor while awaiting results.  Brandan B Edsall was evaluated in Emergency Department on 11/15/2020 for the symptoms described in the history of present illness. She was evaluated in the context of the global COVID-19 pandemic, which necessitated consideration that the patient might be at risk for infection with the SARS-CoV-2 virus that causes COVID-19. Institutional protocols and algorithms that pertain to the evaluation of patients at risk for COVID-19 are in a state of rapid change based on information released by regulatory bodies including the CDC and federal and state organizations. These policies and algorithms were followed during the patient's care in the ED.  ____________________________________________   FINAL CLINICAL IMPRESSION(S) / ED DIAGNOSES  Fall Head injury   Harvest Dark, MD 11/15/20 [id number]

## 2020-11-16 ENCOUNTER — Other Ambulatory Visit: Payer: Self-pay

## 2020-11-16 DIAGNOSIS — R609 Edema, unspecified: Secondary | ICD-10-CM | POA: Diagnosis not present

## 2020-11-16 DIAGNOSIS — S0081XS Abrasion of other part of head, sequela: Secondary | ICD-10-CM | POA: Diagnosis not present

## 2020-11-16 DIAGNOSIS — R5381 Other malaise: Secondary | ICD-10-CM | POA: Diagnosis not present

## 2020-11-16 DIAGNOSIS — F0390 Unspecified dementia without behavioral disturbance: Secondary | ICD-10-CM | POA: Diagnosis not present

## 2020-11-16 DIAGNOSIS — R238 Other skin changes: Secondary | ICD-10-CM | POA: Diagnosis not present

## 2020-11-16 DIAGNOSIS — K5909 Other constipation: Secondary | ICD-10-CM | POA: Diagnosis not present

## 2020-11-16 DIAGNOSIS — R531 Weakness: Secondary | ICD-10-CM | POA: Diagnosis not present

## 2020-11-16 DIAGNOSIS — R000 Tachycardia, unspecified: Secondary | ICD-10-CM | POA: Diagnosis not present

## 2020-11-16 DIAGNOSIS — W19XXXA Unspecified fall, initial encounter: Secondary | ICD-10-CM | POA: Diagnosis not present

## 2020-11-16 DIAGNOSIS — R634 Abnormal weight loss: Secondary | ICD-10-CM | POA: Diagnosis not present

## 2020-11-16 NOTE — ED Notes (Signed)
Unit secretary in process of arranging [place] ride home for patient.

## 2020-11-18 DIAGNOSIS — I4892 Unspecified atrial flutter: Secondary | ICD-10-CM | POA: Diagnosis not present

## 2020-11-18 DIAGNOSIS — R2241 Localized swelling, mass and lump, right lower limb: Secondary | ICD-10-CM | POA: Diagnosis not present

## 2020-11-23 DIAGNOSIS — R5383 Other fatigue: Secondary | ICD-10-CM | POA: Diagnosis not present

## 2020-11-23 DIAGNOSIS — R634 Abnormal weight loss: Secondary | ICD-10-CM | POA: Diagnosis not present

## 2020-11-23 DIAGNOSIS — R600 Localized edema: Secondary | ICD-10-CM | POA: Diagnosis not present

## 2020-11-23 DIAGNOSIS — I129 Hypertensive chronic kidney disease with stage 1 through stage 4 chronic kidney disease, or unspecified chronic kidney disease: Secondary | ICD-10-CM | POA: Diagnosis not present

## 2020-11-23 DIAGNOSIS — N189 Chronic kidney disease, unspecified: Secondary | ICD-10-CM | POA: Diagnosis not present

## 2020-12-03 DIAGNOSIS — I4892 Unspecified atrial flutter: Secondary | ICD-10-CM | POA: Diagnosis not present

## 2020-12-03 DIAGNOSIS — I129 Hypertensive chronic kidney disease with stage 1 through stage 4 chronic kidney disease, or unspecified chronic kidney disease: Secondary | ICD-10-CM | POA: Diagnosis not present

## 2020-12-03 DIAGNOSIS — F0150 Vascular dementia without behavioral disturbance: Secondary | ICD-10-CM | POA: Diagnosis not present

## 2020-12-03 DIAGNOSIS — N182 Chronic kidney disease, stage 2 (mild): Secondary | ICD-10-CM | POA: Diagnosis not present

## 2020-12-07 DIAGNOSIS — I129 Hypertensive chronic kidney disease with stage 1 through stage 4 chronic kidney disease, or unspecified chronic kidney disease: Secondary | ICD-10-CM | POA: Diagnosis not present

## 2020-12-07 DIAGNOSIS — F0150 Vascular dementia without behavioral disturbance: Secondary | ICD-10-CM | POA: Diagnosis not present

## 2020-12-07 DIAGNOSIS — R5383 Other fatigue: Secondary | ICD-10-CM | POA: Diagnosis not present

## 2020-12-07 DIAGNOSIS — N189 Chronic kidney disease, unspecified: Secondary | ICD-10-CM | POA: Diagnosis not present

## 2020-12-13 DIAGNOSIS — F0390 Unspecified dementia without behavioral disturbance: Secondary | ICD-10-CM | POA: Diagnosis not present

## 2020-12-13 DIAGNOSIS — J45909 Unspecified asthma, uncomplicated: Secondary | ICD-10-CM | POA: Diagnosis not present

## 2020-12-13 DIAGNOSIS — I482 Chronic atrial fibrillation, unspecified: Secondary | ICD-10-CM | POA: Diagnosis not present

## 2020-12-13 DIAGNOSIS — Z789 Other specified health status: Secondary | ICD-10-CM | POA: Diagnosis not present

## 2020-12-16 DIAGNOSIS — E0789 Other specified disorders of thyroid: Secondary | ICD-10-CM | POA: Diagnosis not present

## 2020-12-16 DIAGNOSIS — E538 Deficiency of other specified B group vitamins: Secondary | ICD-10-CM | POA: Diagnosis not present

## 2020-12-16 DIAGNOSIS — I229 Subsequent ST elevation (STEMI) myocardial infarction of unspecified site: Secondary | ICD-10-CM | POA: Diagnosis not present

## 2020-12-16 DIAGNOSIS — N189 Chronic kidney disease, unspecified: Secondary | ICD-10-CM | POA: Diagnosis not present

## 2020-12-20 DIAGNOSIS — R8290 Unspecified abnormal findings in urine: Secondary | ICD-10-CM | POA: Diagnosis not present

## 2020-12-20 DIAGNOSIS — N390 Urinary tract infection, site not specified: Secondary | ICD-10-CM | POA: Diagnosis not present

## 2020-12-28 ENCOUNTER — Ambulatory Visit (INDEPENDENT_AMBULATORY_CARE_PROVIDER_SITE_OTHER): Payer: PPO

## 2020-12-28 DIAGNOSIS — I495 Sick sinus syndrome: Secondary | ICD-10-CM

## 2020-12-29 LAB — CUP PACEART REMOTE DEVICE CHECK
Battery Remaining Percentage: 80 %
Brady Statistic RA Percent Paced: 92 %
Brady Statistic RV Percent Paced: 0 %
Date Time Interrogation Session: 20220517162711
Implantable Lead Implant Date: 20200207
Implantable Lead Implant Date: 20200207
Implantable Lead Location: 753859
Implantable Lead Location: 753860
Implantable Lead Model: 5076
Implantable Pulse Generator Implant Date: 20200207
Lead Channel Impedance Value: 507 Ohm
Lead Channel Impedance Value: 663 Ohm
Lead Channel Pacing Threshold Amplitude: 0.6 V
Lead Channel Pacing Threshold Amplitude: 0.7 V
Lead Channel Pacing Threshold Pulse Width: 0.4 ms
Lead Channel Sensing Intrinsic Amplitude: 1.7 mV
Lead Channel Sensing Intrinsic Amplitude: 8.8 mV
Lead Channel Setting Pacing Amplitude: 3 V
Lead Channel Setting Pacing Amplitude: 3 V
Lead Channel Setting Pacing Pulse Width: 0.4 ms
Pulse Gen Model: 407145
Pulse Gen Serial Number: 69503042

## 2020-12-31 DIAGNOSIS — I482 Chronic atrial fibrillation, unspecified: Secondary | ICD-10-CM | POA: Diagnosis not present

## 2020-12-31 DIAGNOSIS — J45909 Unspecified asthma, uncomplicated: Secondary | ICD-10-CM | POA: Diagnosis not present

## 2020-12-31 DIAGNOSIS — Z0000 Encounter for general adult medical examination without abnormal findings: Secondary | ICD-10-CM | POA: Diagnosis not present

## 2020-12-31 DIAGNOSIS — Z789 Other specified health status: Secondary | ICD-10-CM | POA: Diagnosis not present

## 2020-12-31 DIAGNOSIS — F0390 Unspecified dementia without behavioral disturbance: Secondary | ICD-10-CM | POA: Diagnosis not present

## 2021-01-17 ENCOUNTER — Telehealth: Payer: Self-pay

## 2021-01-17 ENCOUNTER — Telehealth: Payer: Self-pay | Admitting: Emergency Medicine

## 2021-01-17 NOTE — Telephone Encounter (Addendum)
Attempted to contact patient for [employer] Alert received for disconnected monitor. Spoke with patient's DPR and he stated that patient is in a nursing home and that she had changed rooms. He is going to make sure that the monitor is connected today during his visit. [facility] number given if any problems [phone].

## 2021-01-17 NOTE — Telephone Encounter (Signed)
I spoke with the patient daughter in law Maudie Mercury. She gave me the number to [facility].  I spoke with the nurse and she states the monitor is plug in and it says ok. She unplugged it and moved it to a different plug to see if it will work. I told her we will not be able to tell today. I told her if it still say disconnected monitor we will call back tomorrow. They may have to call [employer] to get a monitor ordered.

## 2021-01-17 NOTE — Telephone Encounter (Signed)
The patient son states the nursing home states the monitor is plug in. I asked did the monitor say OK? He did not asked them to look at the monitor. I asked for the nursing home number so I can see if it says OK. I told him we may have to call [employer] if the monitor is not working? He states he do not have the nursing home number he had to call his wife to get the number then he will call me back.

## 2021-01-18 DIAGNOSIS — R1312 Dysphagia, oropharyngeal phase: Secondary | ICD-10-CM | POA: Diagnosis not present

## 2021-01-18 NOTE — Telephone Encounter (Signed)
I let the patient son know her monitor is now working.

## 2021-01-18 NOTE — Telephone Encounter (Signed)
Monitor up to date as of 01/17/2021.

## 2021-01-19 NOTE — Progress Notes (Signed)
Remote pacemaker transmission.   

## 2021-02-11 DEATH — deceased

## 2021-04-08 IMAGING — CT CT HEAD W/O CM
3 series · 16 of 47 positions shown, 19 images · non-contrast
Comparison: 07/26/2019

CLINICAL DATA: [AGE] with mental status change.

EXAM:
CT HEAD WITHOUT CONTRAST
TECHNIQUE: Contiguous axial images were obtained from the base of the skull
through the vertex without intravenous contrast.

[Series 2: head wo · axial · 0.43mm/px · z∈[-115,+15]mm · 10 of 32 slices shown, 13 images]
[im 3/32  brain]
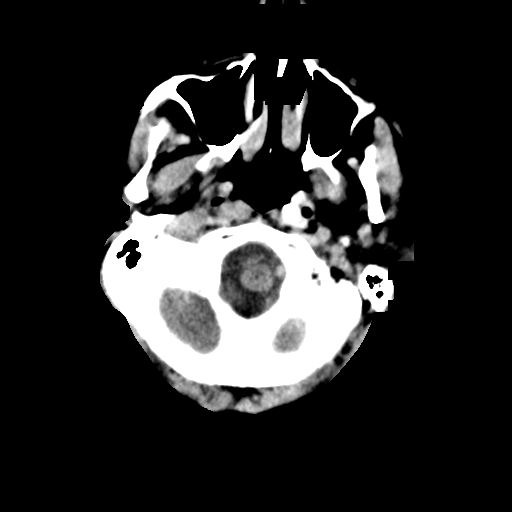
[im 3/32  bone]
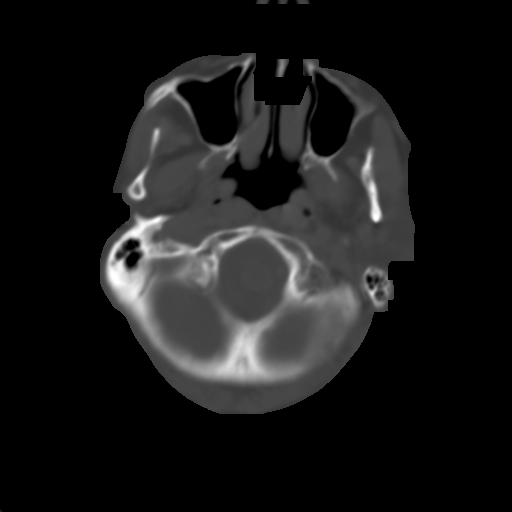
[im 6/32  brain]
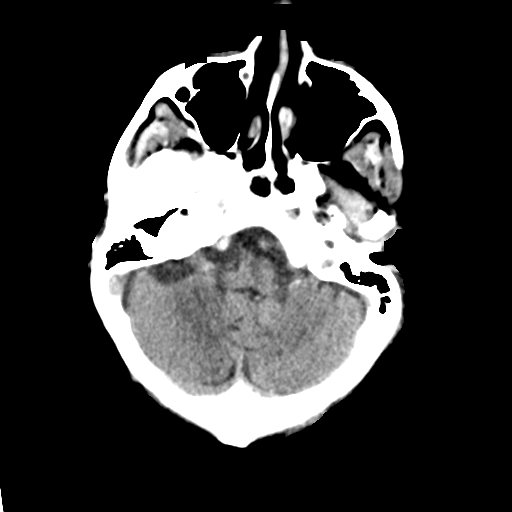
[im 9/32  brain]
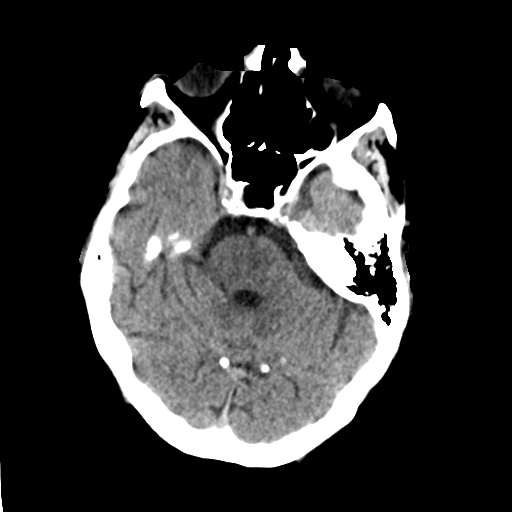
[im 11/32  brain]
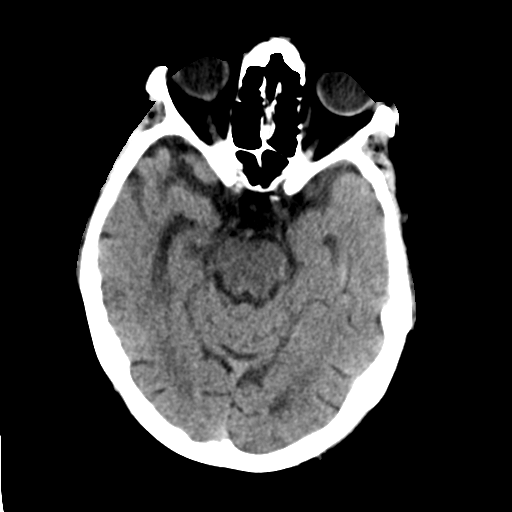
[im 14/32  brain]
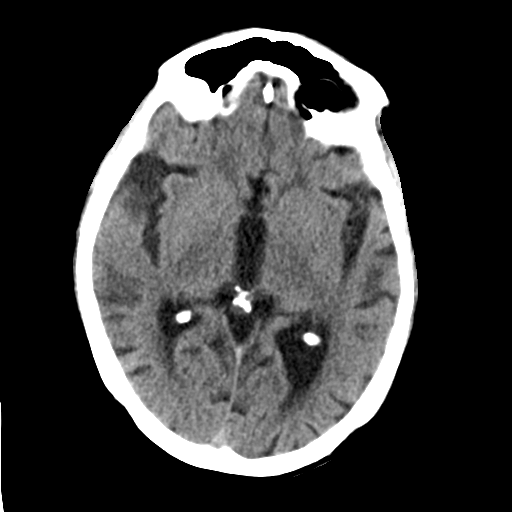
[im 14/32  bone]
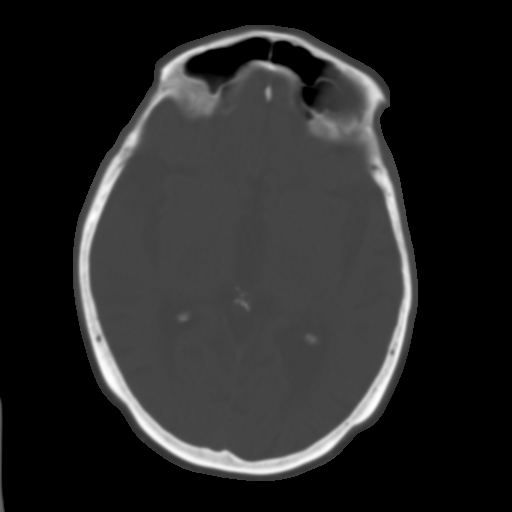
[im 18/32  brain]
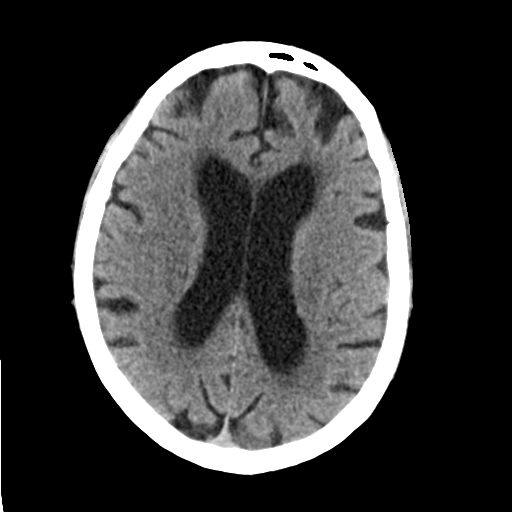
[im 21/32  brain]
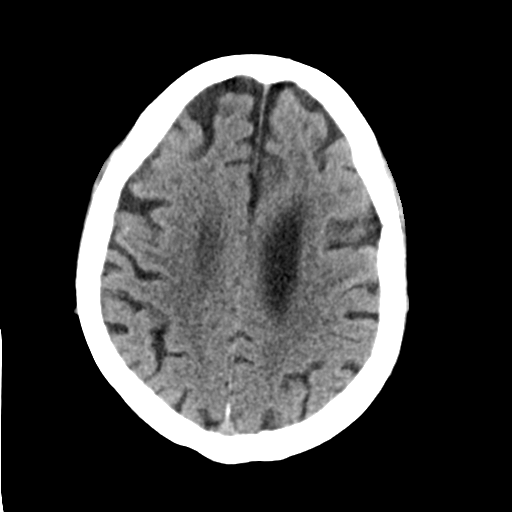
[im 24/32  brain]
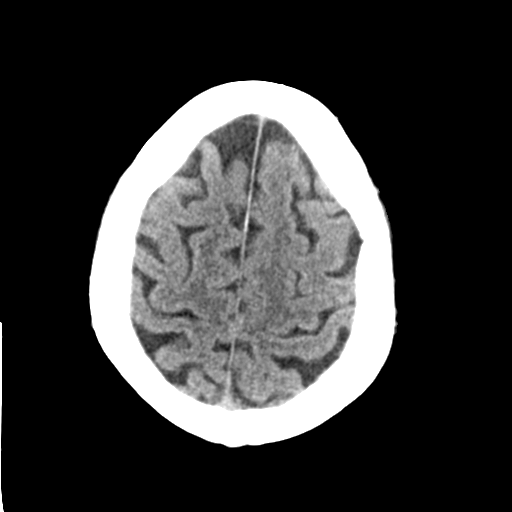
[im 26/32  brain]
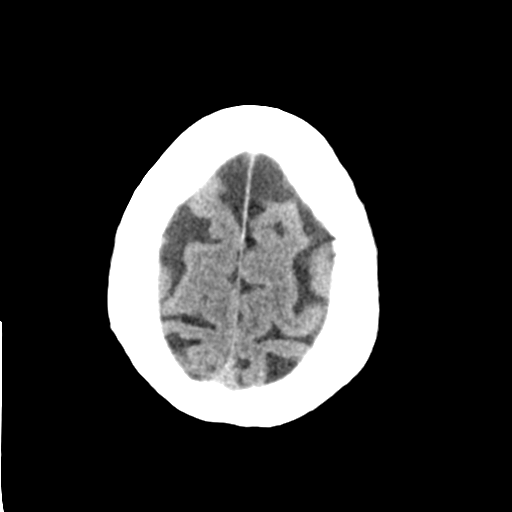
[im 26/32  bone]
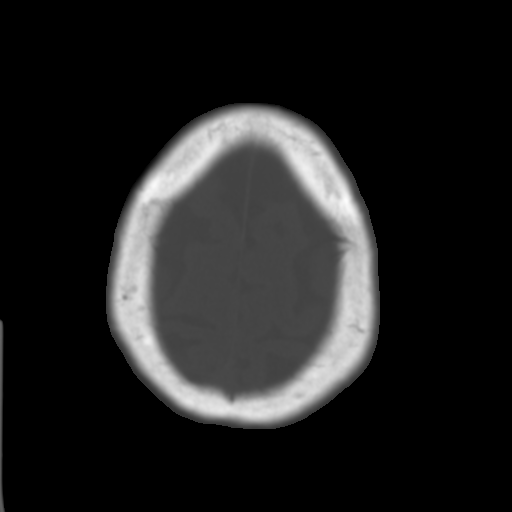
[im 29/32  brain]
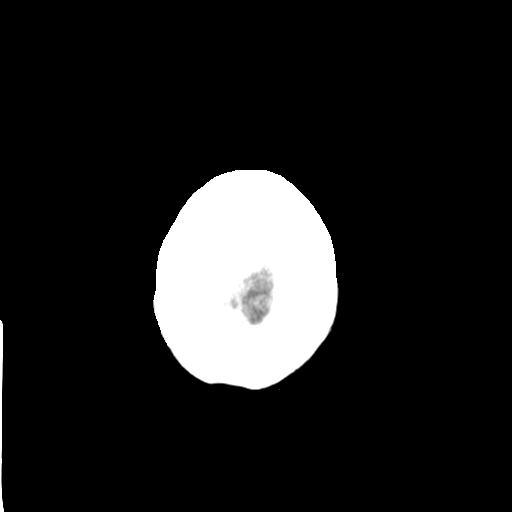

[Series 4: coronal soft tissue · coronal · 0.31mm/px · 3 of 69 slices shown]
[im 25/69  brain]
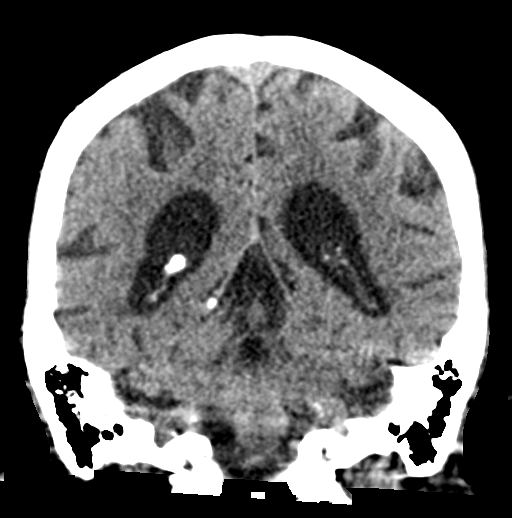
[im 31/69  brain]
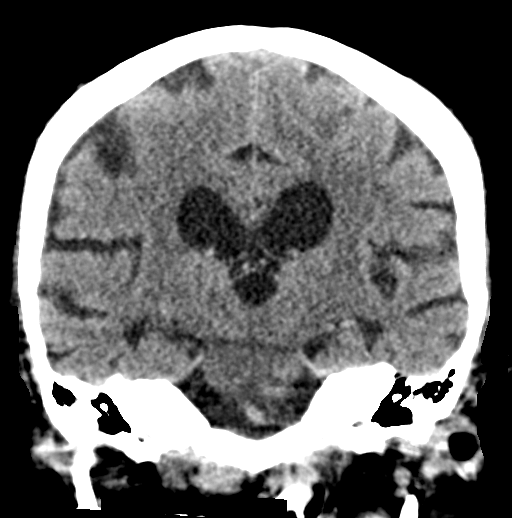
[im 38/69  brain]
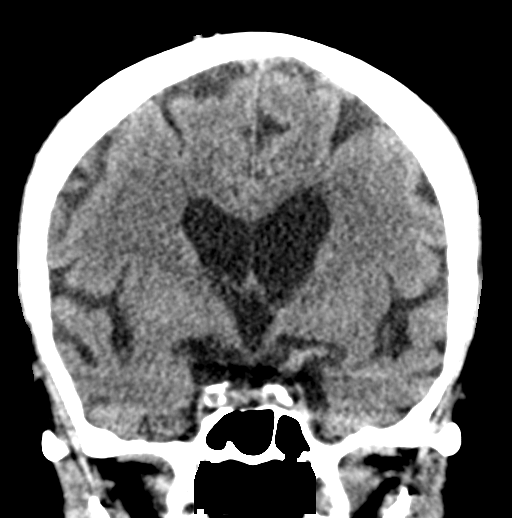

[Series 5: sagittal soft tissue · sagittal · 0.31mm/px · 3 of 54 slices shown]
[im 18/54  brain]
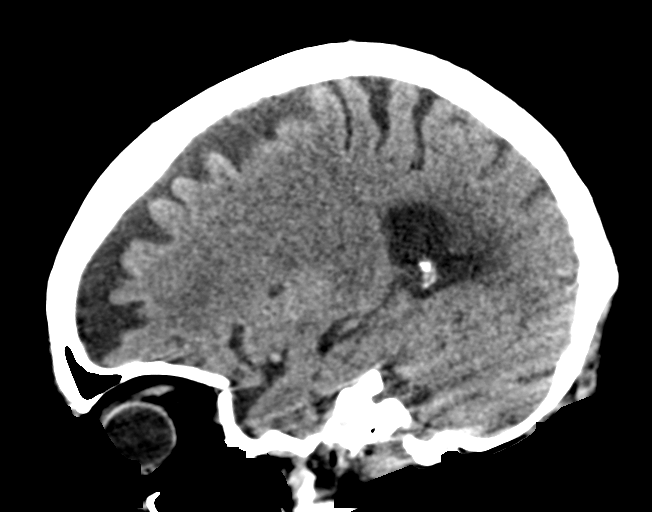
[im 27/54  brain]
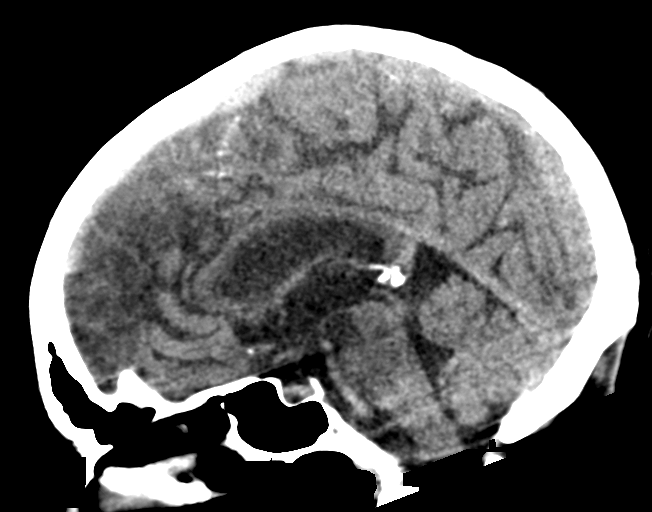
[im 36/54  brain]
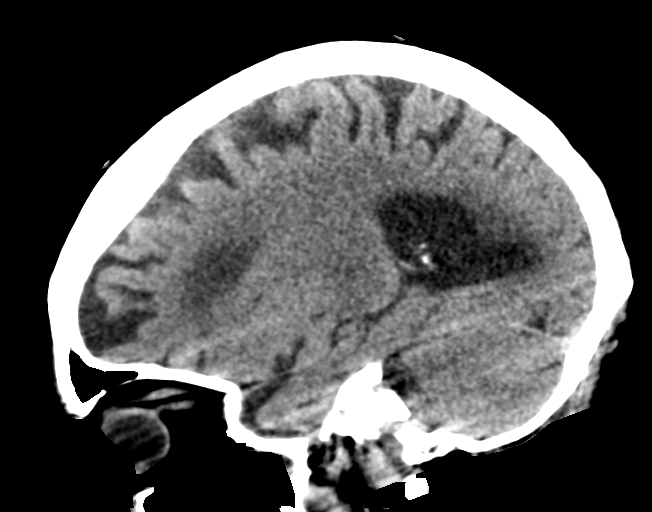

[16 of 47 positions shown; findings below may reference images not displayed]

FINDINGS: Brain: Mild cerebral atrophy is stable. No evidence for acute
hemorrhage, mass lesion, midline shift, hydrocephalus or large
infarct. Old lacune infarct in the right basal ganglia.

Vascular: No hyperdense vessel or unexpected calcification.

Skull: Normal. Negative for fracture or focal lesion.

Sinuses/Orbits: No acute finding.

Other: Stable calcified density along the left side of the scalp.
IMPRESSION: No acute intracranial abnormality.
